# Patient Record
Sex: Male | Born: 1945
Health system: Southern US, Community
[De-identification: ages and names within clinical notes are randomized; demographics above are authoritative.]

## PROBLEM LIST (undated history)

## (undated) DIAGNOSIS — M109 Gout, unspecified: Secondary | ICD-10-CM

## (undated) DIAGNOSIS — G4733 Obstructive sleep apnea (adult) (pediatric): Secondary | ICD-10-CM

## (undated) DIAGNOSIS — J984 Other disorders of lung: Secondary | ICD-10-CM

## (undated) DIAGNOSIS — M549 Dorsalgia, unspecified: Secondary | ICD-10-CM

## (undated) DIAGNOSIS — R1310 Dysphagia, unspecified: Secondary | ICD-10-CM

## (undated) DIAGNOSIS — M353 Polymyalgia rheumatica: Secondary | ICD-10-CM

## (undated) DIAGNOSIS — I639 Cerebral infarction, unspecified: Secondary | ICD-10-CM

## (undated) DIAGNOSIS — E785 Hyperlipidemia, unspecified: Secondary | ICD-10-CM

## (undated) DIAGNOSIS — I10 Essential (primary) hypertension: Secondary | ICD-10-CM

## (undated) DIAGNOSIS — F528 Other sexual dysfunction not due to a substance or known physiological condition: Secondary | ICD-10-CM

## (undated) DIAGNOSIS — J309 Allergic rhinitis, unspecified: Secondary | ICD-10-CM

## (undated) DIAGNOSIS — R1319 Other dysphagia: Secondary | ICD-10-CM

## (undated) DIAGNOSIS — J45909 Unspecified asthma, uncomplicated: Secondary | ICD-10-CM

## (undated) DIAGNOSIS — N259 Disorder resulting from impaired renal tubular function, unspecified: Secondary | ICD-10-CM

## (undated) DIAGNOSIS — K649 Unspecified hemorrhoids: Secondary | ICD-10-CM

## (undated) DIAGNOSIS — M5030 Other cervical disc degeneration, unspecified cervical region: Secondary | ICD-10-CM

## (undated) DIAGNOSIS — N400 Enlarged prostate without lower urinary tract symptoms: Secondary | ICD-10-CM

## (undated) DIAGNOSIS — K219 Gastro-esophageal reflux disease without esophagitis: Secondary | ICD-10-CM

## (undated) DIAGNOSIS — G4730 Sleep apnea, unspecified: Secondary | ICD-10-CM

## (undated) DIAGNOSIS — F329 Major depressive disorder, single episode, unspecified: Secondary | ICD-10-CM

## (undated) DIAGNOSIS — M79609 Pain in unspecified limb: Secondary | ICD-10-CM

## (undated) DIAGNOSIS — K222 Esophageal obstruction: Secondary | ICD-10-CM

## (undated) DIAGNOSIS — K5900 Constipation, unspecified: Secondary | ICD-10-CM

## (undated) DIAGNOSIS — T7840XA Allergy, unspecified, initial encounter: Secondary | ICD-10-CM

## (undated) DIAGNOSIS — M1990 Unspecified osteoarthritis, unspecified site: Secondary | ICD-10-CM

## (undated) DIAGNOSIS — M545 Low back pain: Secondary | ICD-10-CM

## (undated) DIAGNOSIS — M2550 Pain in unspecified joint: Secondary | ICD-10-CM

## (undated) DIAGNOSIS — Z8679 Personal history of other diseases of the circulatory system: Secondary | ICD-10-CM

## (undated) DIAGNOSIS — Z86010 Personal history of colonic polyps: Secondary | ICD-10-CM

## (undated) DIAGNOSIS — G629 Polyneuropathy, unspecified: Secondary | ICD-10-CM

## (undated) DIAGNOSIS — N289 Disorder of kidney and ureter, unspecified: Secondary | ICD-10-CM

## (undated) DIAGNOSIS — K5730 Diverticulosis of large intestine without perforation or abscess without bleeding: Secondary | ICD-10-CM

## (undated) DIAGNOSIS — I7389 Other specified peripheral vascular diseases: Secondary | ICD-10-CM

## (undated) HISTORY — DX: Allergic rhinitis, unspecified: J30.9

## (undated) HISTORY — PX: TOTAL KNEE ARTHROPLASTY: SHX125

## (undated) HISTORY — DX: Constipation, unspecified: K59.00

## (undated) HISTORY — DX: Major depressive disorder, single episode, unspecified: F32.9

## (undated) HISTORY — DX: Pain in unspecified joint: M25.50

## (undated) HISTORY — DX: Unspecified asthma, uncomplicated: J45.909

## (undated) HISTORY — DX: Obstructive sleep apnea (adult) (pediatric): G47.33

## (undated) HISTORY — DX: Unspecified osteoarthritis, unspecified site: M19.90

## (undated) HISTORY — DX: Other disorders of lung: J98.4

## (undated) HISTORY — DX: Esophageal obstruction: K22.2

## (undated) HISTORY — DX: Diverticulosis of large intestine without perforation or abscess without bleeding: K57.30

## (undated) HISTORY — DX: Benign prostatic hyperplasia without lower urinary tract symptoms: N40.0

## (undated) HISTORY — DX: Gout, unspecified: M10.9

## (undated) HISTORY — DX: Other sexual dysfunction not due to a substance or known physiological condition: F52.8

## (undated) HISTORY — DX: Other cervical disc degeneration, unspecified cervical region: M50.30

## (undated) HISTORY — DX: Allergy, unspecified, initial encounter: T78.40XA

## (undated) HISTORY — PX: KNEE SURGERY: SHX244

## (undated) HISTORY — PX: KNEE ARTHROSCOPY: SUR90

## (undated) HISTORY — DX: Disorder of kidney and ureter, unspecified: N28.9

## (undated) HISTORY — DX: Other specified peripheral vascular diseases: I73.89

## (undated) HISTORY — DX: Dorsalgia, unspecified: M54.9

## (undated) HISTORY — DX: Sleep apnea, unspecified: G47.30

## (undated) HISTORY — DX: Dysphagia, unspecified: R13.10

## (undated) HISTORY — PX: ROTATOR CUFF REPAIR: SHX139

## (undated) HISTORY — PX: COLONOSCOPY: SHX174

## (undated) HISTORY — DX: Essential (primary) hypertension: I10

## (undated) HISTORY — DX: Polymyalgia rheumatica: M35.3

## (undated) HISTORY — DX: Personal history of colonic polyps: Z86.010

## (undated) HISTORY — DX: Gastro-esophageal reflux disease without esophagitis: K21.9

## (undated) HISTORY — DX: Cerebral infarction, unspecified: I63.9

## (undated) HISTORY — DX: Pain in unspecified limb: M79.609

## (undated) HISTORY — DX: Low back pain: M54.5

## (undated) HISTORY — DX: Hyperlipidemia, unspecified: E78.5

## (undated) HISTORY — DX: Polyneuropathy, unspecified: G62.9

## (undated) HISTORY — DX: Unspecified hemorrhoids: K64.9

## (undated) HISTORY — DX: Personal history of other diseases of the circulatory system: Z86.79

## (undated) HISTORY — DX: Disorder resulting from impaired renal tubular function, unspecified: N25.9

## (undated) HISTORY — DX: Other dysphagia: R13.19

---

## 1997-12-09 ENCOUNTER — Emergency Department (HOSPITAL_COMMUNITY): Admission: EM | Admit: 1997-12-09 | Discharge: 1997-12-09 | Payer: Self-pay | Admitting: Emergency Medicine

## 1998-12-07 ENCOUNTER — Emergency Department (HOSPITAL_COMMUNITY): Admission: EM | Admit: 1998-12-07 | Discharge: 1998-12-07 | Payer: Self-pay | Admitting: Emergency Medicine

## 1999-04-30 ENCOUNTER — Encounter: Payer: Self-pay | Admitting: Emergency Medicine

## 1999-04-30 ENCOUNTER — Emergency Department (HOSPITAL_COMMUNITY): Admission: EM | Admit: 1999-04-30 | Discharge: 1999-04-30 | Payer: Self-pay | Admitting: Emergency Medicine

## 1999-11-01 ENCOUNTER — Inpatient Hospital Stay (HOSPITAL_COMMUNITY): Admission: EM | Admit: 1999-11-01 | Discharge: 1999-11-02 | Payer: Self-pay | Admitting: Emergency Medicine

## 1999-11-01 ENCOUNTER — Encounter: Payer: Self-pay | Admitting: Emergency Medicine

## 1999-11-10 ENCOUNTER — Encounter: Admission: RE | Admit: 1999-11-10 | Discharge: 1999-11-10 | Payer: Self-pay | Admitting: Family Medicine

## 1999-11-13 ENCOUNTER — Encounter: Admission: RE | Admit: 1999-11-13 | Discharge: 1999-11-13 | Payer: Self-pay | Admitting: Family Medicine

## 1999-11-22 ENCOUNTER — Ambulatory Visit (HOSPITAL_COMMUNITY): Admission: RE | Admit: 1999-11-22 | Discharge: 1999-11-22 | Payer: Self-pay | Admitting: *Deleted

## 1999-12-01 ENCOUNTER — Encounter: Admission: RE | Admit: 1999-12-01 | Discharge: 1999-12-01 | Payer: Self-pay | Admitting: Family Medicine

## 2000-01-31 ENCOUNTER — Encounter: Admission: RE | Admit: 2000-01-31 | Discharge: 2000-01-31 | Payer: Self-pay | Admitting: Family Medicine

## 2000-04-17 ENCOUNTER — Emergency Department (HOSPITAL_COMMUNITY): Admission: EM | Admit: 2000-04-17 | Discharge: 2000-04-17 | Payer: Self-pay | Admitting: Emergency Medicine

## 2000-06-22 ENCOUNTER — Encounter: Payer: Self-pay | Admitting: Emergency Medicine

## 2000-06-22 ENCOUNTER — Emergency Department (HOSPITAL_COMMUNITY): Admission: EM | Admit: 2000-06-22 | Discharge: 2000-06-22 | Payer: Self-pay | Admitting: Emergency Medicine

## 2000-06-23 ENCOUNTER — Emergency Department (HOSPITAL_COMMUNITY): Admission: EM | Admit: 2000-06-23 | Discharge: 2000-06-23 | Payer: Self-pay | Admitting: Emergency Medicine

## 2001-04-11 ENCOUNTER — Encounter: Payer: Self-pay | Admitting: Internal Medicine

## 2001-04-11 ENCOUNTER — Ambulatory Visit (HOSPITAL_COMMUNITY): Admission: RE | Admit: 2001-04-11 | Discharge: 2001-04-11 | Payer: Self-pay | Admitting: Internal Medicine

## 2001-06-03 ENCOUNTER — Emergency Department (HOSPITAL_COMMUNITY): Admission: EM | Admit: 2001-06-03 | Discharge: 2001-06-03 | Payer: Self-pay | Admitting: Emergency Medicine

## 2002-03-26 ENCOUNTER — Emergency Department (HOSPITAL_COMMUNITY): Admission: EM | Admit: 2002-03-26 | Discharge: 2002-03-26 | Payer: Self-pay | Admitting: Emergency Medicine

## 2002-04-27 ENCOUNTER — Emergency Department (HOSPITAL_COMMUNITY): Admission: EM | Admit: 2002-04-27 | Discharge: 2002-04-28 | Payer: Self-pay | Admitting: Emergency Medicine

## 2002-04-27 ENCOUNTER — Encounter: Payer: Self-pay | Admitting: Emergency Medicine

## 2002-04-29 ENCOUNTER — Ambulatory Visit (HOSPITAL_COMMUNITY): Admission: RE | Admit: 2002-04-29 | Discharge: 2002-04-29 | Payer: Self-pay | Admitting: *Deleted

## 2002-04-29 ENCOUNTER — Encounter: Payer: Self-pay | Admitting: Specialist

## 2003-04-09 ENCOUNTER — Encounter: Payer: Self-pay | Admitting: *Deleted

## 2003-04-09 ENCOUNTER — Emergency Department (HOSPITAL_COMMUNITY): Admission: EM | Admit: 2003-04-09 | Discharge: 2003-04-09 | Payer: Self-pay | Admitting: *Deleted

## 2003-06-03 ENCOUNTER — Emergency Department (HOSPITAL_COMMUNITY): Admission: EM | Admit: 2003-06-03 | Discharge: 2003-06-03 | Payer: Self-pay

## 2003-11-06 ENCOUNTER — Emergency Department (HOSPITAL_COMMUNITY): Admission: EM | Admit: 2003-11-06 | Discharge: 2003-11-06 | Payer: Self-pay | Admitting: Emergency Medicine

## 2004-03-10 ENCOUNTER — Emergency Department (HOSPITAL_COMMUNITY): Admission: EM | Admit: 2004-03-10 | Discharge: 2004-03-11 | Payer: Self-pay | Admitting: Emergency Medicine

## 2004-05-11 ENCOUNTER — Emergency Department (HOSPITAL_COMMUNITY): Admission: EM | Admit: 2004-05-11 | Discharge: 2004-05-12 | Payer: Self-pay | Admitting: Emergency Medicine

## 2005-01-28 ENCOUNTER — Emergency Department (HOSPITAL_COMMUNITY): Admission: EM | Admit: 2005-01-28 | Discharge: 2005-01-28 | Payer: Self-pay | Admitting: Emergency Medicine

## 2005-02-01 ENCOUNTER — Ambulatory Visit: Payer: Self-pay | Admitting: Internal Medicine

## 2005-02-25 ENCOUNTER — Emergency Department (HOSPITAL_COMMUNITY): Admission: EM | Admit: 2005-02-25 | Discharge: 2005-02-25 | Payer: Self-pay | Admitting: Emergency Medicine

## 2005-04-05 ENCOUNTER — Emergency Department (HOSPITAL_COMMUNITY): Admission: EM | Admit: 2005-04-05 | Discharge: 2005-04-05 | Payer: Self-pay | Admitting: Family Medicine

## 2005-04-08 ENCOUNTER — Emergency Department (HOSPITAL_COMMUNITY): Admission: EM | Admit: 2005-04-08 | Discharge: 2005-04-08 | Payer: Self-pay | Admitting: Emergency Medicine

## 2005-05-01 ENCOUNTER — Ambulatory Visit: Payer: Self-pay | Admitting: Internal Medicine

## 2005-05-02 ENCOUNTER — Ambulatory Visit: Payer: Self-pay | Admitting: Internal Medicine

## 2005-05-02 LAB — CONVERTED CEMR LAB: PSA: 0.62 ng/mL

## 2005-05-20 ENCOUNTER — Emergency Department (HOSPITAL_COMMUNITY): Admission: EM | Admit: 2005-05-20 | Discharge: 2005-05-20 | Payer: Self-pay | Admitting: Emergency Medicine

## 2005-05-24 ENCOUNTER — Ambulatory Visit (HOSPITAL_COMMUNITY): Admission: RE | Admit: 2005-05-24 | Discharge: 2005-05-24 | Payer: Self-pay | Admitting: Emergency Medicine

## 2005-06-12 ENCOUNTER — Ambulatory Visit: Payer: Self-pay | Admitting: Internal Medicine

## 2005-06-12 ENCOUNTER — Observation Stay (HOSPITAL_COMMUNITY): Admission: EM | Admit: 2005-06-12 | Discharge: 2005-06-13 | Payer: Self-pay | Admitting: Emergency Medicine

## 2005-06-14 ENCOUNTER — Ambulatory Visit: Payer: Self-pay

## 2005-06-19 ENCOUNTER — Ambulatory Visit: Payer: Self-pay

## 2005-06-22 ENCOUNTER — Ambulatory Visit: Payer: Self-pay | Admitting: Internal Medicine

## 2005-07-22 ENCOUNTER — Emergency Department (HOSPITAL_COMMUNITY): Admission: EM | Admit: 2005-07-22 | Discharge: 2005-07-22 | Payer: Self-pay | Admitting: Emergency Medicine

## 2005-07-29 ENCOUNTER — Emergency Department (HOSPITAL_COMMUNITY): Admission: EM | Admit: 2005-07-29 | Discharge: 2005-07-29 | Payer: Self-pay | Admitting: Emergency Medicine

## 2005-08-07 ENCOUNTER — Emergency Department (HOSPITAL_COMMUNITY): Admission: EM | Admit: 2005-08-07 | Discharge: 2005-08-08 | Payer: Self-pay | Admitting: Emergency Medicine

## 2005-08-23 ENCOUNTER — Ambulatory Visit: Payer: Self-pay | Admitting: Internal Medicine

## 2005-10-03 ENCOUNTER — Ambulatory Visit: Payer: Self-pay | Admitting: Internal Medicine

## 2005-10-17 ENCOUNTER — Emergency Department (HOSPITAL_COMMUNITY): Admission: EM | Admit: 2005-10-17 | Discharge: 2005-10-17 | Payer: Self-pay | Admitting: Emergency Medicine

## 2005-10-30 ENCOUNTER — Ambulatory Visit: Payer: Self-pay | Admitting: Pulmonary Disease

## 2005-10-31 ENCOUNTER — Ambulatory Visit: Payer: Self-pay | Admitting: Pulmonary Disease

## 2005-11-06 ENCOUNTER — Ambulatory Visit: Payer: Self-pay | Admitting: *Deleted

## 2005-11-06 ENCOUNTER — Ambulatory Visit: Payer: Self-pay | Admitting: Internal Medicine

## 2005-11-12 ENCOUNTER — Ambulatory Visit (HOSPITAL_BASED_OUTPATIENT_CLINIC_OR_DEPARTMENT_OTHER): Admission: RE | Admit: 2005-11-12 | Discharge: 2005-11-12 | Payer: Self-pay | Admitting: Pulmonary Disease

## 2005-11-27 ENCOUNTER — Ambulatory Visit: Payer: Self-pay | Admitting: Internal Medicine

## 2005-11-27 ENCOUNTER — Ambulatory Visit: Payer: Self-pay | Admitting: Pulmonary Disease

## 2005-12-06 ENCOUNTER — Emergency Department (HOSPITAL_COMMUNITY): Admission: EM | Admit: 2005-12-06 | Discharge: 2005-12-06 | Payer: Self-pay | Admitting: Emergency Medicine

## 2005-12-10 ENCOUNTER — Emergency Department (HOSPITAL_COMMUNITY): Admission: EM | Admit: 2005-12-10 | Discharge: 2005-12-10 | Payer: Self-pay | Admitting: Emergency Medicine

## 2005-12-17 ENCOUNTER — Ambulatory Visit: Payer: Self-pay | Admitting: Internal Medicine

## 2005-12-18 ENCOUNTER — Ambulatory Visit (HOSPITAL_COMMUNITY): Admission: RE | Admit: 2005-12-18 | Discharge: 2005-12-18 | Payer: Self-pay | Admitting: Internal Medicine

## 2006-01-18 ENCOUNTER — Emergency Department (HOSPITAL_COMMUNITY): Admission: EM | Admit: 2006-01-18 | Discharge: 2006-01-19 | Payer: Self-pay | Admitting: Emergency Medicine

## 2006-01-21 ENCOUNTER — Ambulatory Visit: Payer: Self-pay | Admitting: Internal Medicine

## 2006-03-19 ENCOUNTER — Emergency Department (HOSPITAL_COMMUNITY): Admission: EM | Admit: 2006-03-19 | Discharge: 2006-03-19 | Payer: Self-pay | Admitting: Emergency Medicine

## 2006-06-11 ENCOUNTER — Emergency Department (HOSPITAL_COMMUNITY): Admission: EM | Admit: 2006-06-11 | Discharge: 2006-06-11 | Payer: Self-pay | Admitting: Emergency Medicine

## 2006-08-06 ENCOUNTER — Emergency Department (HOSPITAL_COMMUNITY): Admission: EM | Admit: 2006-08-06 | Discharge: 2006-08-06 | Payer: Self-pay | Admitting: Emergency Medicine

## 2006-09-24 ENCOUNTER — Ambulatory Visit: Payer: Self-pay | Admitting: Gastroenterology

## 2006-10-08 ENCOUNTER — Encounter (INDEPENDENT_AMBULATORY_CARE_PROVIDER_SITE_OTHER): Payer: Self-pay | Admitting: Specialist

## 2006-10-08 ENCOUNTER — Ambulatory Visit: Payer: Self-pay | Admitting: Gastroenterology

## 2007-01-02 ENCOUNTER — Emergency Department (HOSPITAL_COMMUNITY): Admission: EM | Admit: 2007-01-02 | Discharge: 2007-01-02 | Payer: Self-pay | Admitting: Emergency Medicine

## 2007-01-25 ENCOUNTER — Emergency Department (HOSPITAL_COMMUNITY): Admission: EM | Admit: 2007-01-25 | Discharge: 2007-01-25 | Payer: Self-pay | Admitting: Emergency Medicine

## 2007-03-15 ENCOUNTER — Emergency Department (HOSPITAL_COMMUNITY): Admission: EM | Admit: 2007-03-15 | Discharge: 2007-03-15 | Payer: Self-pay | Admitting: Emergency Medicine

## 2007-03-28 ENCOUNTER — Encounter: Payer: Self-pay | Admitting: Internal Medicine

## 2007-03-28 DIAGNOSIS — M5030 Other cervical disc degeneration, unspecified cervical region: Secondary | ICD-10-CM

## 2007-03-28 DIAGNOSIS — I10 Essential (primary) hypertension: Secondary | ICD-10-CM

## 2007-03-28 DIAGNOSIS — M1990 Unspecified osteoarthritis, unspecified site: Secondary | ICD-10-CM

## 2007-03-28 DIAGNOSIS — M159 Polyosteoarthritis, unspecified: Secondary | ICD-10-CM | POA: Insufficient documentation

## 2007-03-28 HISTORY — DX: Unspecified osteoarthritis, unspecified site: M19.90

## 2007-03-28 HISTORY — DX: Other cervical disc degeneration, unspecified cervical region: M50.30

## 2007-03-28 HISTORY — DX: Essential (primary) hypertension: I10

## 2007-04-09 ENCOUNTER — Encounter: Payer: Self-pay | Admitting: Internal Medicine

## 2007-04-09 DIAGNOSIS — G4733 Obstructive sleep apnea (adult) (pediatric): Secondary | ICD-10-CM

## 2007-04-09 DIAGNOSIS — N400 Enlarged prostate without lower urinary tract symptoms: Secondary | ICD-10-CM

## 2007-04-09 DIAGNOSIS — J309 Allergic rhinitis, unspecified: Secondary | ICD-10-CM

## 2007-04-09 DIAGNOSIS — F528 Other sexual dysfunction not due to a substance or known physiological condition: Secondary | ICD-10-CM

## 2007-04-09 DIAGNOSIS — M545 Low back pain, unspecified: Secondary | ICD-10-CM

## 2007-04-09 HISTORY — DX: Obstructive sleep apnea (adult) (pediatric): G47.33

## 2007-04-09 HISTORY — DX: Other sexual dysfunction not due to a substance or known physiological condition: F52.8

## 2007-04-09 HISTORY — DX: Benign prostatic hyperplasia without lower urinary tract symptoms: N40.0

## 2007-04-09 HISTORY — DX: Allergic rhinitis, unspecified: J30.9

## 2007-04-09 HISTORY — DX: Low back pain, unspecified: M54.50

## 2007-04-15 ENCOUNTER — Emergency Department (HOSPITAL_COMMUNITY): Admission: EM | Admit: 2007-04-15 | Discharge: 2007-04-15 | Payer: Self-pay | Admitting: Emergency Medicine

## 2007-08-12 ENCOUNTER — Emergency Department (HOSPITAL_COMMUNITY): Admission: EM | Admit: 2007-08-12 | Discharge: 2007-08-12 | Payer: Self-pay | Admitting: Emergency Medicine

## 2007-08-21 ENCOUNTER — Emergency Department (HOSPITAL_COMMUNITY): Admission: EM | Admit: 2007-08-21 | Discharge: 2007-08-21 | Payer: Self-pay | Admitting: Emergency Medicine

## 2007-09-17 ENCOUNTER — Emergency Department (HOSPITAL_COMMUNITY): Admission: EM | Admit: 2007-09-17 | Discharge: 2007-09-17 | Payer: Self-pay | Admitting: Emergency Medicine

## 2007-09-20 ENCOUNTER — Emergency Department (HOSPITAL_COMMUNITY): Admission: EM | Admit: 2007-09-20 | Discharge: 2007-09-20 | Payer: Self-pay | Admitting: Emergency Medicine

## 2007-10-13 ENCOUNTER — Emergency Department (HOSPITAL_COMMUNITY): Admission: EM | Admit: 2007-10-13 | Discharge: 2007-10-13 | Payer: Self-pay | Admitting: Emergency Medicine

## 2007-11-10 ENCOUNTER — Encounter: Payer: Self-pay | Admitting: Internal Medicine

## 2007-11-17 ENCOUNTER — Encounter: Payer: Self-pay | Admitting: Internal Medicine

## 2007-11-20 ENCOUNTER — Telehealth: Payer: Self-pay | Admitting: Internal Medicine

## 2007-12-04 ENCOUNTER — Ambulatory Visit: Payer: Self-pay | Admitting: Internal Medicine

## 2007-12-04 DIAGNOSIS — Z860100 Personal history of colon polyps, unspecified: Secondary | ICD-10-CM

## 2007-12-04 DIAGNOSIS — J984 Other disorders of lung: Secondary | ICD-10-CM

## 2007-12-04 DIAGNOSIS — K5730 Diverticulosis of large intestine without perforation or abscess without bleeding: Secondary | ICD-10-CM | POA: Insufficient documentation

## 2007-12-04 DIAGNOSIS — E7800 Pure hypercholesterolemia, unspecified: Secondary | ICD-10-CM | POA: Insufficient documentation

## 2007-12-04 DIAGNOSIS — E785 Hyperlipidemia, unspecified: Secondary | ICD-10-CM

## 2007-12-04 DIAGNOSIS — J45909 Unspecified asthma, uncomplicated: Secondary | ICD-10-CM

## 2007-12-04 DIAGNOSIS — K219 Gastro-esophageal reflux disease without esophagitis: Secondary | ICD-10-CM

## 2007-12-04 DIAGNOSIS — F329 Major depressive disorder, single episode, unspecified: Secondary | ICD-10-CM

## 2007-12-04 DIAGNOSIS — F3289 Other specified depressive episodes: Secondary | ICD-10-CM

## 2007-12-04 HISTORY — DX: Other specified depressive episodes: F32.89

## 2007-12-04 HISTORY — DX: Personal history of colonic polyps: Z86.010

## 2007-12-04 HISTORY — DX: Hyperlipidemia, unspecified: E78.5

## 2007-12-04 HISTORY — DX: Other disorders of lung: J98.4

## 2007-12-04 HISTORY — DX: Personal history of colon polyps, unspecified: Z86.0100

## 2007-12-04 HISTORY — DX: Major depressive disorder, single episode, unspecified: F32.9

## 2007-12-04 HISTORY — DX: Gastro-esophageal reflux disease without esophagitis: K21.9

## 2007-12-04 HISTORY — DX: Diverticulosis of large intestine without perforation or abscess without bleeding: K57.30

## 2007-12-04 HISTORY — DX: Unspecified asthma, uncomplicated: J45.909

## 2007-12-10 ENCOUNTER — Encounter: Admission: RE | Admit: 2007-12-10 | Discharge: 2008-03-09 | Payer: Self-pay | Admitting: Sports Medicine

## 2007-12-12 ENCOUNTER — Ambulatory Visit (HOSPITAL_COMMUNITY): Admission: RE | Admit: 2007-12-12 | Discharge: 2007-12-12 | Payer: Self-pay | Admitting: Internal Medicine

## 2007-12-17 ENCOUNTER — Encounter: Payer: Self-pay | Admitting: Internal Medicine

## 2008-01-19 ENCOUNTER — Encounter: Payer: Self-pay | Admitting: Internal Medicine

## 2008-02-11 ENCOUNTER — Ambulatory Visit: Payer: Self-pay | Admitting: Internal Medicine

## 2008-02-11 ENCOUNTER — Telehealth (INDEPENDENT_AMBULATORY_CARE_PROVIDER_SITE_OTHER): Payer: Self-pay | Admitting: *Deleted

## 2008-02-11 DIAGNOSIS — K649 Unspecified hemorrhoids: Secondary | ICD-10-CM | POA: Insufficient documentation

## 2008-02-11 HISTORY — DX: Unspecified hemorrhoids: K64.9

## 2008-02-12 LAB — CONVERTED CEMR LAB
ALT: 21 units/L (ref 0–53)
Basophils Relative: 0.5 % (ref 0.0–1.0)
CO2: 28 meq/L (ref 19–32)
Calcium: 9.1 mg/dL (ref 8.4–10.5)
Creatinine, Ser: 1.6 mg/dL — ABNORMAL HIGH (ref 0.4–1.5)
Eosinophils Relative: 5 % (ref 0.0–5.0)
Glucose, Bld: 97 mg/dL (ref 70–99)
Hemoglobin: 12.8 g/dL — ABNORMAL LOW (ref 13.0–17.0)
Ketones, ur: NEGATIVE mg/dL
Leukocytes, UA: NEGATIVE
Lymphocytes Relative: 30.7 % (ref 12.0–46.0)
Monocytes Relative: 17.4 % — ABNORMAL HIGH (ref 3.0–12.0)
Neutro Abs: 2.2 10*3/uL (ref 1.4–7.7)
Nitrite: NEGATIVE
PSA: 0.75 ng/mL (ref 0.10–4.00)
RBC: 4.53 M/uL (ref 4.22–5.81)
Specific Gravity, Urine: 1.015 (ref 1.000–1.03)
TSH: 1.21 microintl units/mL (ref 0.35–5.50)
Total CHOL/HDL Ratio: 5.1
Total Protein: 7.5 g/dL (ref 6.0–8.3)
Triglycerides: 95 mg/dL (ref 0–149)
pH: 6 (ref 5.0–8.0)

## 2008-02-16 ENCOUNTER — Telehealth (INDEPENDENT_AMBULATORY_CARE_PROVIDER_SITE_OTHER): Payer: Self-pay | Admitting: *Deleted

## 2008-02-18 ENCOUNTER — Ambulatory Visit: Payer: Self-pay | Admitting: Internal Medicine

## 2008-02-18 LAB — CONVERTED CEMR LAB
ALT: 20 units/L (ref 0–53)
AST: 21 units/L (ref 0–37)
Albumin: 3.4 g/dL — ABNORMAL LOW (ref 3.5–5.2)
Alkaline Phosphatase: 96 units/L (ref 39–117)
Bilirubin, Direct: 0.1 mg/dL (ref 0.0–0.3)
Cholesterol: 188 mg/dL (ref 0–200)
HDL: 47.3 mg/dL (ref >39.0)
LDL Cholesterol: 132 mg/dL — ABNORMAL HIGH (ref 0–99)
Total Bilirubin: 0.5 mg/dL (ref 0.3–1.2)
Total CHOL/HDL Ratio: 4
Total Protein: 7.2 g/dL (ref 6.0–8.3)
Triglycerides: 43 mg/dL (ref 0–149)
VLDL: 9 mg/dL (ref 0–40)

## 2008-02-19 ENCOUNTER — Telehealth: Payer: Self-pay | Admitting: Internal Medicine

## 2008-02-25 ENCOUNTER — Telehealth: Payer: Self-pay | Admitting: Gastroenterology

## 2008-03-13 ENCOUNTER — Emergency Department (HOSPITAL_COMMUNITY): Admission: EM | Admit: 2008-03-13 | Discharge: 2008-03-13 | Payer: Self-pay | Admitting: Emergency Medicine

## 2008-03-16 ENCOUNTER — Ambulatory Visit: Payer: Self-pay | Admitting: Internal Medicine

## 2008-03-16 DIAGNOSIS — R1310 Dysphagia, unspecified: Secondary | ICD-10-CM | POA: Insufficient documentation

## 2008-03-16 HISTORY — DX: Dysphagia, unspecified: R13.10

## 2008-03-17 ENCOUNTER — Encounter: Payer: Self-pay | Admitting: Internal Medicine

## 2008-03-23 ENCOUNTER — Ambulatory Visit: Payer: Self-pay | Admitting: Gastroenterology

## 2008-03-23 LAB — CONVERTED CEMR LAB
Albumin: 3.3 g/dL — ABNORMAL LOW (ref 3.5–5.2)
Alkaline Phosphatase: 93 units/L (ref 39–117)
LDL Cholesterol: 111 mg/dL — ABNORMAL HIGH (ref 0–99)
Total CHOL/HDL Ratio: 3.1
Triglycerides: 52 mg/dL (ref 0–149)
VLDL: 10 mg/dL (ref 0–40)

## 2008-03-25 ENCOUNTER — Ambulatory Visit: Payer: Self-pay | Admitting: Internal Medicine

## 2008-04-07 ENCOUNTER — Ambulatory Visit: Payer: Self-pay | Admitting: Gastroenterology

## 2008-04-12 ENCOUNTER — Telehealth: Payer: Self-pay | Admitting: Internal Medicine

## 2008-04-12 ENCOUNTER — Ambulatory Visit: Payer: Self-pay | Admitting: Internal Medicine

## 2008-04-12 DIAGNOSIS — R079 Chest pain, unspecified: Secondary | ICD-10-CM

## 2008-04-21 ENCOUNTER — Ambulatory Visit: Payer: Self-pay | Admitting: Gastroenterology

## 2008-05-09 ENCOUNTER — Emergency Department (HOSPITAL_COMMUNITY): Admission: EM | Admit: 2008-05-09 | Discharge: 2008-05-09 | Payer: Self-pay | Admitting: Emergency Medicine

## 2008-05-24 ENCOUNTER — Encounter: Payer: Self-pay | Admitting: Gastroenterology

## 2008-05-26 ENCOUNTER — Telehealth: Payer: Self-pay | Admitting: Gastroenterology

## 2008-05-26 ENCOUNTER — Ambulatory Visit: Payer: Self-pay | Admitting: Gastroenterology

## 2008-05-26 ENCOUNTER — Ambulatory Visit: Payer: Self-pay | Admitting: Internal Medicine

## 2008-05-26 DIAGNOSIS — K222 Esophageal obstruction: Secondary | ICD-10-CM

## 2008-05-26 HISTORY — DX: Esophageal obstruction: K22.2

## 2008-05-28 ENCOUNTER — Ambulatory Visit (HOSPITAL_COMMUNITY): Admission: RE | Admit: 2008-05-28 | Discharge: 2008-05-28 | Payer: Self-pay | Admitting: Gastroenterology

## 2008-06-01 ENCOUNTER — Encounter: Payer: Self-pay | Admitting: Gastroenterology

## 2008-07-02 ENCOUNTER — Encounter: Payer: Self-pay | Admitting: Internal Medicine

## 2008-07-12 ENCOUNTER — Telehealth: Payer: Self-pay | Admitting: Gastroenterology

## 2008-07-13 ENCOUNTER — Ambulatory Visit: Payer: Self-pay | Admitting: Internal Medicine

## 2008-07-13 DIAGNOSIS — M2550 Pain in unspecified joint: Secondary | ICD-10-CM

## 2008-07-13 HISTORY — DX: Pain in unspecified joint: M25.50

## 2008-07-13 LAB — CONVERTED CEMR LAB: Rhuematoid fact SerPl-aCnc: <20 intl units/mL — ABNORMAL LOW (ref 0.0–20.0)

## 2008-07-14 ENCOUNTER — Telehealth: Payer: Self-pay | Admitting: Gastroenterology

## 2008-07-14 ENCOUNTER — Encounter: Payer: Self-pay | Admitting: Gastroenterology

## 2008-07-14 LAB — CONVERTED CEMR LAB: Anti Nuclear Antibody(ANA): NEGATIVE

## 2008-07-26 ENCOUNTER — Ambulatory Visit: Payer: Self-pay | Admitting: Internal Medicine

## 2008-07-26 DIAGNOSIS — M353 Polymyalgia rheumatica: Secondary | ICD-10-CM

## 2008-07-26 HISTORY — DX: Polymyalgia rheumatica: M35.3

## 2008-07-27 ENCOUNTER — Telehealth (INDEPENDENT_AMBULATORY_CARE_PROVIDER_SITE_OTHER): Payer: Self-pay | Admitting: *Deleted

## 2008-07-30 ENCOUNTER — Encounter: Payer: Self-pay | Admitting: Internal Medicine

## 2008-10-01 ENCOUNTER — Encounter: Payer: Self-pay | Admitting: Internal Medicine

## 2009-01-12 ENCOUNTER — Encounter: Admission: RE | Admit: 2009-01-12 | Discharge: 2009-01-12 | Payer: Self-pay | Admitting: Nephrology

## 2009-01-17 ENCOUNTER — Ambulatory Visit (HOSPITAL_COMMUNITY): Admission: RE | Admit: 2009-01-17 | Discharge: 2009-01-17 | Payer: Self-pay | Admitting: Nephrology

## 2009-01-21 ENCOUNTER — Ambulatory Visit (HOSPITAL_COMMUNITY): Admission: RE | Admit: 2009-01-21 | Discharge: 2009-01-21 | Payer: Self-pay | Admitting: Nephrology

## 2009-04-01 ENCOUNTER — Emergency Department (HOSPITAL_COMMUNITY): Admission: EM | Admit: 2009-04-01 | Discharge: 2009-04-02 | Payer: Self-pay | Admitting: Emergency Medicine

## 2009-06-14 ENCOUNTER — Encounter: Admission: RE | Admit: 2009-06-14 | Discharge: 2009-06-14 | Payer: Self-pay | Admitting: Nephrology

## 2009-08-31 ENCOUNTER — Encounter: Payer: Self-pay | Admitting: Internal Medicine

## 2009-09-16 ENCOUNTER — Ambulatory Visit: Payer: Self-pay | Admitting: Internal Medicine

## 2009-09-16 DIAGNOSIS — M549 Dorsalgia, unspecified: Secondary | ICD-10-CM | POA: Insufficient documentation

## 2009-09-16 HISTORY — DX: Dorsalgia, unspecified: M54.9

## 2009-09-16 LAB — CONVERTED CEMR LAB
Albumin: 3.7 g/dL (ref 3.5–5.2)
BUN: 15 mg/dL (ref 6–23)
Bilirubin Urine: NEGATIVE
Chloride: 104 meq/L (ref 96–112)
Cholesterol: 202 mg/dL — ABNORMAL HIGH (ref 0–200)
Creatinine, Ser: 1.9 mg/dL — ABNORMAL HIGH (ref 0.4–1.5)
Glucose, Bld: 92 mg/dL (ref 70–99)
HCT: 46.1 % (ref 39.0–52.0)
HDL: 47.2 mg/dL (ref >39.00)
Ketones, ur: NEGATIVE mg/dL
MCV: 85.2 fL (ref 78.0–100.0)
Nitrite: NEGATIVE
Platelets: 197 10*3/uL (ref 150.0–400.0)
Potassium: 4 meq/L (ref 3.5–5.1)
RDW: 12.4 % (ref 11.5–14.6)
Sodium: 141 meq/L (ref 135–145)
Total Protein, Urine: NEGATIVE mg/dL
Triglycerides: 55 mg/dL (ref 0.0–149.0)
Urine Glucose: NEGATIVE mg/dL
VLDL: 11 mg/dL (ref 0.0–40.0)
pH: 6 (ref 5.0–8.0)

## 2009-09-19 ENCOUNTER — Telehealth: Payer: Self-pay | Admitting: Internal Medicine

## 2009-10-04 ENCOUNTER — Telehealth: Payer: Self-pay | Admitting: Internal Medicine

## 2009-11-21 ENCOUNTER — Ambulatory Visit: Payer: Self-pay | Admitting: Internal Medicine

## 2009-11-21 DIAGNOSIS — R1319 Other dysphagia: Secondary | ICD-10-CM

## 2009-11-21 DIAGNOSIS — R1013 Epigastric pain: Secondary | ICD-10-CM | POA: Insufficient documentation

## 2009-11-21 HISTORY — DX: Other dysphagia: R13.19

## 2009-11-21 LAB — CONVERTED CEMR LAB
ALT: 20 units/L (ref 0–53)
Albumin: 3.5 g/dL (ref 3.5–5.2)
BUN: 16 mg/dL (ref 6–23)
Basophils Relative: 0.6 % (ref 0.0–3.0)
Bilirubin Urine: NEGATIVE
Chloride: 105 meq/L (ref 96–112)
Eosinophils Relative: 5.6 % — ABNORMAL HIGH (ref 0.0–5.0)
HCT: 38.9 % — ABNORMAL LOW (ref 39.0–52.0)
Hemoglobin, Urine: NEGATIVE
Leukocytes, UA: NEGATIVE
Lipase: 24 units/L (ref 11.0–59.0)
Lymphs Abs: 1.7 10*3/uL (ref 0.7–4.0)
MCV: 82.9 fL (ref 78.0–100.0)
Monocytes Absolute: 0.8 10*3/uL (ref 0.1–1.0)
Nitrite: NEGATIVE
Platelets: 227 10*3/uL (ref 150.0–400.0)
Potassium: 4.3 meq/L (ref 3.5–5.1)
Total Bilirubin: 0.4 mg/dL (ref 0.3–1.2)
Total Protein: 7 g/dL (ref 6.0–8.3)
Urobilinogen, UA: 0.2 (ref 0.0–1.0)
WBC: 5.1 10*3/uL (ref 4.5–10.5)

## 2009-11-23 ENCOUNTER — Telehealth: Payer: Self-pay | Admitting: Gastroenterology

## 2009-12-09 ENCOUNTER — Encounter (INDEPENDENT_AMBULATORY_CARE_PROVIDER_SITE_OTHER): Payer: Self-pay | Admitting: *Deleted

## 2009-12-21 ENCOUNTER — Ambulatory Visit: Payer: Self-pay | Admitting: Internal Medicine

## 2009-12-21 ENCOUNTER — Ambulatory Visit: Payer: Self-pay | Admitting: Gastroenterology

## 2009-12-26 ENCOUNTER — Telehealth: Payer: Self-pay | Admitting: Internal Medicine

## 2009-12-26 ENCOUNTER — Telehealth (INDEPENDENT_AMBULATORY_CARE_PROVIDER_SITE_OTHER): Payer: Self-pay | Admitting: *Deleted

## 2009-12-30 ENCOUNTER — Ambulatory Visit: Payer: Self-pay | Admitting: Gastroenterology

## 2009-12-30 ENCOUNTER — Encounter: Payer: Self-pay | Admitting: Gastroenterology

## 2010-01-20 ENCOUNTER — Ambulatory Visit: Payer: Self-pay | Admitting: Internal Medicine

## 2010-01-25 ENCOUNTER — Ambulatory Visit: Payer: Self-pay | Admitting: Internal Medicine

## 2010-01-25 DIAGNOSIS — M79609 Pain in unspecified limb: Secondary | ICD-10-CM

## 2010-01-25 HISTORY — DX: Pain in unspecified limb: M79.609

## 2010-01-25 LAB — CONVERTED CEMR LAB
Bilirubin Urine: NEGATIVE
Hemoglobin, Urine: NEGATIVE
Ketones, ur: NEGATIVE mg/dL
Leukocytes, UA: NEGATIVE
Nitrite: NEGATIVE
Specific Gravity, Urine: 1.02 (ref 1.000–1.030)
Total Protein, Urine: NEGATIVE mg/dL
Urine Glucose: NEGATIVE mg/dL
Urobilinogen, UA: 0.2 (ref 0.0–1.0)
pH: 5.5 (ref 5.0–8.0)

## 2010-02-02 ENCOUNTER — Encounter: Payer: Self-pay | Admitting: Internal Medicine

## 2010-02-09 ENCOUNTER — Encounter: Payer: Self-pay | Admitting: Internal Medicine

## 2010-02-09 ENCOUNTER — Ambulatory Visit: Payer: Self-pay

## 2010-02-10 DIAGNOSIS — I739 Peripheral vascular disease, unspecified: Secondary | ICD-10-CM | POA: Insufficient documentation

## 2010-02-10 DIAGNOSIS — I7389 Other specified peripheral vascular diseases: Secondary | ICD-10-CM

## 2010-02-10 HISTORY — DX: Other specified peripheral vascular diseases: I73.89

## 2010-02-23 ENCOUNTER — Emergency Department (HOSPITAL_COMMUNITY): Admission: EM | Admit: 2010-02-23 | Discharge: 2010-02-23 | Payer: Self-pay | Admitting: Emergency Medicine

## 2010-02-27 ENCOUNTER — Telehealth: Payer: Self-pay | Admitting: Internal Medicine

## 2010-03-10 ENCOUNTER — Ambulatory Visit: Payer: Self-pay | Admitting: Internal Medicine

## 2010-03-10 DIAGNOSIS — J069 Acute upper respiratory infection, unspecified: Secondary | ICD-10-CM | POA: Insufficient documentation

## 2010-03-10 DIAGNOSIS — N390 Urinary tract infection, site not specified: Secondary | ICD-10-CM | POA: Insufficient documentation

## 2010-03-11 LAB — CONVERTED CEMR LAB
Ketones, ur: NEGATIVE mg/dL
Leukocytes, UA: NEGATIVE
Specific Gravity, Urine: 1.02 (ref 1.000–1.030)
Urine Glucose: NEGATIVE mg/dL
pH: 5.5 (ref 5.0–8.0)

## 2010-03-13 ENCOUNTER — Encounter: Payer: Self-pay | Admitting: Internal Medicine

## 2010-03-15 ENCOUNTER — Telehealth: Payer: Self-pay | Admitting: Internal Medicine

## 2010-03-15 ENCOUNTER — Ambulatory Visit: Payer: Self-pay | Admitting: Internal Medicine

## 2010-03-15 DIAGNOSIS — R109 Unspecified abdominal pain: Secondary | ICD-10-CM | POA: Insufficient documentation

## 2010-03-15 DIAGNOSIS — N259 Disorder resulting from impaired renal tubular function, unspecified: Secondary | ICD-10-CM

## 2010-03-15 HISTORY — DX: Disorder resulting from impaired renal tubular function, unspecified: N25.9

## 2010-03-16 ENCOUNTER — Ambulatory Visit: Payer: Self-pay | Admitting: Cardiovascular Disease

## 2010-03-16 LAB — CONVERTED CEMR LAB
ALT: 16 units/L (ref 0–53)
AST: 19 units/L (ref 0–37)
Alkaline Phosphatase: 86 units/L (ref 39–117)
Basophils Relative: 0.3 % (ref 0.0–3.0)
Bilirubin, Direct: 0.1 mg/dL (ref 0.0–0.3)
Chloride: 102 meq/L (ref 96–112)
Creatinine, Ser: 2 mg/dL — ABNORMAL HIGH (ref 0.4–1.5)
Eosinophils Relative: 1.9 % (ref 0.0–5.0)
GFR calc non Af Amer: 35.35 mL/min (ref >60)
Lymphocytes Relative: 33.5 % (ref 12.0–46.0)
MCV: 85 fL (ref 78.0–100.0)
Monocytes Absolute: 1 10*3/uL (ref 0.1–1.0)
Monocytes Relative: 16.9 % — ABNORMAL HIGH (ref 3.0–12.0)
Neutrophils Relative %: 47.4 % (ref 43.0–77.0)
Platelets: 229 10*3/uL (ref 150.0–400.0)
RBC: 4.92 M/uL (ref 4.22–5.81)
Total Bilirubin: 0.4 mg/dL (ref 0.3–1.2)
Total Protein: 7 g/dL (ref 6.0–8.3)
WBC: 5.7 10*3/uL (ref 4.5–10.5)

## 2010-03-20 ENCOUNTER — Ambulatory Visit: Payer: Self-pay | Admitting: Internal Medicine

## 2010-03-20 LAB — CONVERTED CEMR LAB
BUN: 21 mg/dL (ref 6–23)
CO2: 28 meq/L (ref 19–32)
Calcium: 8.9 mg/dL (ref 8.4–10.5)
Chloride: 107 meq/L (ref 96–112)
Creatinine, Ser: 1.8 mg/dL — ABNORMAL HIGH (ref 0.4–1.5)
Glucose, Bld: 145 mg/dL — ABNORMAL HIGH (ref 70–99)

## 2010-03-30 ENCOUNTER — Ambulatory Visit: Payer: Self-pay | Admitting: Internal Medicine

## 2010-03-30 ENCOUNTER — Telehealth: Payer: Self-pay | Admitting: Internal Medicine

## 2010-03-30 DIAGNOSIS — M542 Cervicalgia: Secondary | ICD-10-CM

## 2010-04-11 ENCOUNTER — Encounter: Payer: Self-pay | Admitting: Internal Medicine

## 2010-04-25 ENCOUNTER — Telehealth: Payer: Self-pay | Admitting: Gastroenterology

## 2010-06-13 ENCOUNTER — Encounter: Payer: Self-pay | Admitting: Internal Medicine

## 2010-06-28 ENCOUNTER — Ambulatory Visit: Payer: Self-pay | Admitting: Internal Medicine

## 2010-06-28 DIAGNOSIS — R519 Headache, unspecified: Secondary | ICD-10-CM | POA: Insufficient documentation

## 2010-06-28 DIAGNOSIS — R413 Other amnesia: Secondary | ICD-10-CM | POA: Insufficient documentation

## 2010-06-28 DIAGNOSIS — R51 Headache: Secondary | ICD-10-CM

## 2010-07-04 ENCOUNTER — Telehealth: Payer: Self-pay | Admitting: Internal Medicine

## 2010-07-05 ENCOUNTER — Ambulatory Visit (HOSPITAL_COMMUNITY)
Admission: RE | Admit: 2010-07-05 | Discharge: 2010-07-05 | Payer: Self-pay | Source: Home / Self Care | Admitting: Internal Medicine

## 2010-07-12 ENCOUNTER — Telehealth: Payer: Self-pay | Admitting: Internal Medicine

## 2010-07-14 ENCOUNTER — Ambulatory Visit (HOSPITAL_COMMUNITY): Admission: RE | Admit: 2010-07-14 | Discharge: 2010-07-14 | Payer: Self-pay | Admitting: Internal Medicine

## 2010-07-17 ENCOUNTER — Encounter: Admission: RE | Admit: 2010-07-17 | Discharge: 2010-07-17 | Payer: Self-pay | Admitting: Internal Medicine

## 2010-07-17 ENCOUNTER — Encounter: Payer: Self-pay | Admitting: Internal Medicine

## 2010-07-17 DIAGNOSIS — Z8679 Personal history of other diseases of the circulatory system: Secondary | ICD-10-CM

## 2010-07-17 HISTORY — DX: Personal history of other diseases of the circulatory system: Z86.79

## 2010-08-23 ENCOUNTER — Ambulatory Visit
Admission: RE | Admit: 2010-08-23 | Discharge: 2010-08-23 | Payer: Self-pay | Source: Home / Self Care | Attending: Internal Medicine | Admitting: Internal Medicine

## 2010-08-23 DIAGNOSIS — J019 Acute sinusitis, unspecified: Secondary | ICD-10-CM | POA: Insufficient documentation

## 2010-08-28 ENCOUNTER — Emergency Department (HOSPITAL_COMMUNITY)
Admission: EM | Admit: 2010-08-28 | Discharge: 2010-08-28 | Payer: Self-pay | Source: Home / Self Care | Admitting: Emergency Medicine

## 2010-09-02 ENCOUNTER — Encounter: Payer: Self-pay | Admitting: Emergency Medicine

## 2010-09-03 ENCOUNTER — Encounter: Payer: Self-pay | Admitting: Internal Medicine

## 2010-09-08 ENCOUNTER — Telehealth: Payer: Self-pay | Admitting: Internal Medicine

## 2010-09-12 NOTE — Progress Notes (Signed)
Summary: Groin pain  Phone Note Call from Patient Call back at Home Phone [phone]   Caller: Patient Summary of Call: Pt called stating that he is experiencing increase groin pain since OV. Pt is unsure if he should have beeen referred to Urology, but would like referral and Rx for pain. Please advise Initial call taken by: Crissie Sickles, [place],  March 15, 2010 9:36 AM  Follow-up for Phone Call        ok for referral Follow-up by: Biagio Borg MD,  March 15, 2010 2:37 PM     Appended Document: Groin pain Pt scheduled for appt 03/15/2010 @ 2:45p

## 2010-09-12 NOTE — Assessment & Plan Note (Signed)
Summary: MEMORY PROBLEM---STC   Vital Signs:  Patient profile:   65 year old male Height:      69 inches Weight:      284.50 pounds BMI:     42.17 O2 Sat:      94 % on Room air Temp:     97.5 degrees F oral Pulse rate:   76 / minute BP sitting:   132 / 72  (left arm) Cuff size:   large  Vitals Entered By: Shirlean Mylar Ewing CMA Deborra Medina) (June 28, 2010 2:45 PM)  O2 Flow:  Room air  CC: memory problems/RE   Primary Care Provider:  Cathlean Cower, MD  CC:  memory problems/RE.  History of Present Illness: here wiht concerns, last seen aug 2011,  then saw urology nov 1 with flomax change to rapaflo and givne course of doxycyline;  pt has concerns of forgetfullness worse over the past 4 days, had an episode prior, then recurred again;  having more difficulty with directions - had to stop and think about directions coming here to here; could not remember a certain prayer he has memorized; has trouble with names of pesons he has known for quite some time like a famous person mentioned on TV but remembered later;   has been misplacing things more than usual lately in the past few months;  no trouble with medications .  Also with recurring headaches to post head and upper back and shoulders , started x 2 wks more constant, only intermittent before for a couple of months.  Pt denies new neuro symptoms such as facial or extremity weakness .  Has had increased depressive symtpoms, fatigue, wt gain, low mood, anhedonia, worse now for several wks. Denies worsening panic.    Problems Prior to Update: 1)  Memory Loss  (ICD-780.93) 2)  Headache  (ICD-784.0) 3)  Neck Pain  (ICD-723.1) 4)  Renal Insufficiency  (ICD-588.9) 5)  Abdominal Pain, Lower  (ICD-789.09) 6)  Uti  (ICD-599.0) 7)  Uri  (ICD-465.9) 8)  Pvd With Claudication  (ICD-443.89) 9)  Leg Pain, Bilateral  (ICD-729.5) 10)  Chest Pain  (ICD-786.50) 11)  Abdominal Pain, Epigastric  (ICD-789.06) 12)  Other Dysphagia  (ICD-787.29) 13)  Back Pain   (ICD-724.5) 14)  Preventive Health Care  (ICD-V70.0) 15)  Polymyalgia Rheumatica  (ICD-725) 16)  Chest Pain  (ICD-786.50) 17)  Polyarthralgia  (ICD-719.49) 18)  Esophageal Stricture  (ICD-530.3) 19)  Chest Pain  (ICD-786.50) 20)  Hepatotoxicity, Drug-induced, Risk of  (ICD-V58.69) 21)  Dysphagia Unspecified  (ICD-787.20) 22)  Hemorrhoids, Recurrent  (ICD-455.6) 23)  Preventive Health Care  (ICD-V70.0) 24)  Diverticulosis, Colon  (ICD-562.10) 25)  Colonic Polyps, Hx of  (ICD-V12.72) 26)  Asthma  (ICD-493.90) 27)  Depression  (ICD-311) 28)  Gerd  (ICD-530.81) 29)  Hyperlipidemia  (ICD-272.4) 30)  Lung Nodule  (ICD-518.89) 31)  Sleep Apnea, Obstructive, Moderate  (ICD-327.23) 32)  Benign Prostatic Hypertrophy  (ICD-600.00) 33)  Erectile Dysfunction  (ICD-302.72) 34)  Low Back Pain  (ICD-724.2) 35)  Osteoarthritis  (ICD-715.90) 36)  Allergic Rhinitis  (ICD-477.9) 37)  Degeneration, Cervical Disc  (ICD-722.4) 38)  Disc Disease, Cervical  (ICD-722.4) 39)  Degenerative Joint Disease  (ICD-715.90) 40)  Tb Skin Test, Positive  (ICD-795.5) 41)  Obesity  (ICD-278.00) 42)  Hypertension  (ICD-401.9)  Medications Prior to Update: 1)  Tamsulosin Hcl 0.4 Mg Caps (Tamsulosin Hcl) .Marland Kitchen.. 1po Once Daily 2)  Amlodipine Besylate 5 Mg Tabs (Amlodipine Besylate) .Marland Kitchen.. 1po Once Daily 3)  Flexeril 5  Mg Tabs (Cyclobenzaprine Hcl) .Marland Kitchen.. 1 By Mouth Three Times A Day As Needed 4)  Nasacort Aq 55 Mcg/act  Aers (Triamcinolone Acetonide(Nasal)) .... 2 Spray/side Once Daily As Needed Allergies 5)  Omeprazole 20 Mg Cpdr (Omeprazole) .... 2po Once Daily 6)  Aspir-Low 81 Mg Tbec (Aspirin) .Marland Kitchen.. 1po Once Daily 7)  Hydrochlorothiazide 12.5 Mg Caps (Hydrochlorothiazide) .Marland Kitchen.. 1po Once Daily 8)  Fexofenadine Hcl 180 Mg Tabs (Fexofenadine Hcl) .Marland Kitchen.. 1 By Mouth Once Daily As Needed Allergies 9)  Oxycodone Hcl 5 Mg Tabs (Oxycodone Hcl) .Marland Kitchen.. 1-2 By Mouth Q 6 Hrs As Needed 10)  Prednisone 10 Mg Tabs (Prednisone) .... 3po Qd  For 3days, Then 2po Qd For 3days, Then 1po Qd For 3days, Then Stop 11)  Zyrtec Allergy 10 Mg Tabs (Cetirizine Hcl) .Marland Kitchen.. 1 By Mouth Qd 12)  Hydrocortisone Acetate 25 Mg Supp (Hydrocortisone Acetate) .... Put 1 in Rectum Every Night At Bedtime For 7-10 Nights, For Hemorrhoids. 13)  Miralax   Powd (Polyethylene Glycol 3350) .... Mix 17gm in A Large Glass of Juice or Water and Drink 1 Time Daily.  Current Medications (verified): 1)  Rapaflo 8 Mg Caps (Silodosin) .Marland Kitchen.. 1po Once Daily 2)  Amlodipine Besylate 5 Mg Tabs (Amlodipine Besylate) .Marland Kitchen.. 1po Once Daily 3)  Flexeril 5 Mg Tabs (Cyclobenzaprine Hcl) .Marland Kitchen.. 1 By Mouth Three Times A Day As Needed 4)  Nasacort Aq 55 Mcg/act  Aers (Triamcinolone Acetonide(Nasal)) .... 2 Spray/side Once Daily As Needed Allergies 5)  Omeprazole 20 Mg Cpdr (Omeprazole) .... 2po Once Daily 6)  Aspir-Low 81 Mg Tbec (Aspirin) .Marland Kitchen.. 1po Once Daily 7)  Hydrochlorothiazide 12.5 Mg Caps (Hydrochlorothiazide) .Marland Kitchen.. 1po Once Daily 8)  Fexofenadine Hcl 180 Mg Tabs (Fexofenadine Hcl) .Marland Kitchen.. 1 By Mouth Once Daily As Needed Allergies 9)  Oxycodone Hcl 5 Mg Tabs (Oxycodone Hcl) .Marland Kitchen.. 1-2 By Mouth Q 6 Hrs As Needed 10)  Zyrtec Allergy 10 Mg Tabs (Cetirizine Hcl) .Marland Kitchen.. 1 By Mouth Qd 11)  Sertraline Hcl 100 Mg Tabs (Sertraline Hcl) .Marland Kitchen.. 1 By Mouth Once Daily  Allergies (verified): No Known Drug Allergies  Past History:  Past Medical History: Last updated: 03/15/2010 Hypertension Allergic rhinitis Obesity Osteoarthritis Low back pain Hx of Positive PPD Erectile Dysfunction Benign prostatic hypertrophy Obstructive Sleep Apnea Hyperlipidemia c-spine disc disease GERD Depression Asthma Colonic polyps, hx of - adenomatous Diverticulosis, colon variable medical compliance PMR - probable DDD Renal insufficiency  Past Surgical History: Last updated: 12/16/2009 Total knee replacement Rotator cuff repair - left  Social History: Last updated: 03/23/2008 Married seperated 6  children - 1 died with suicide, 1 with 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   Risk Factors: Exercise: no (03/23/2008)  Risk Factors: Smoking Status: quit (12/04/2007)  Review of Systems       all otherwise negative per pt -    Physical Exam  General:  alert and overweight-appearing.   Head:  normocephalic and atraumatic.   Eyes:  vision grossly intact, pupils equal, and pupils round.   Ears:  R ear normal and L ear normal.   Nose:  no external deformity, nasal dischargemucosal pallor, and mucosal edema.   Mouth:  pharyngeal erythema and fair dentition.   Neck:  supple and no masses.   Lungs:  normal respiratory effort and normal breath sounds.   Heart:  normal rate and regular rhythm.   Abdomen:  soft, non-tender, and normal bowel  sounds.   Extremities:  no edema, no erythema  Neurologic:  strength normal in all extremities, sensation intact to light touch, and gait normal.  cognitive intact to orientation, recall, naming, and repetition  Psych:  flat affect, subdued, and slightly anxious.     Impression & Recommendations:  Problem # 1:  HEADACHE (ICD-784.0)  His updated medication list for this problem includes:    Aspir-low 81 Mg Tbec (Aspirin) .Marland Kitchen... 1po once daily    Oxycodone Hcl 5 Mg Tabs (Oxycodone hcl) .Marland Kitchen... 1-2 by mouth q 6 hrs as needed  Orders: Radiology Referral (Radiology) cant r/o tumor or other - for MRI head  Problem # 2:  MEMORY LOSS (ICD-780.93) doubt alzheimer's, seems to be possibly more psychiatric with worsening depressin and concentration issue - for MRI as above, consider further labs and/or neuro eval  Problem # 3:  DEPRESSION (ICD-311)  recurrent , now more symptomatic, and I suspect possibly related to above #2 - for sertraline asd , declines counseling, nonsuicidal adn encouraged to go to [facility] or ER for worsening  symptoms  His updated medication list for this problem includes:    Sertraline Hcl 100 Mg Tabs (Sertraline hcl) .Marland Kitchen... 1 by mouth once daily  Complete Medication List: 1)  Rapaflo 8 Mg Caps (Silodosin) .Marland Kitchen.. 1po once daily 2)  Amlodipine Besylate 5 Mg Tabs (Amlodipine besylate) .Marland Kitchen.. 1po once daily 3)  Flexeril 5 Mg Tabs (Cyclobenzaprine hcl) .Marland Kitchen.. 1 by mouth three times a day as needed 4)  Nasacort Aq 55 Mcg/act Aers (Triamcinolone acetonide(nasal)) .... 2 spray/side once daily as needed allergies 5)  Omeprazole 20 Mg Cpdr (Omeprazole) .... 2po once daily 6)  Aspir-low 81 Mg Tbec (Aspirin) .Marland Kitchen.. 1po once daily 7)  Hydrochlorothiazide 12.5 Mg Caps (Hydrochlorothiazide) .Marland Kitchen.. 1po once daily 8)  Fexofenadine Hcl 180 Mg Tabs (Fexofenadine hcl) .Marland Kitchen.. 1 by mouth once daily as needed allergies 9)  Oxycodone Hcl 5 Mg Tabs (Oxycodone hcl) .Marland Kitchen.. 1-2 by mouth q 6 hrs as needed 10)  Zyrtec Allergy 10 Mg Tabs (Cetirizine hcl) .Marland Kitchen.. 1 by mouth qd 11)  Sertraline Hcl 100 Mg Tabs (Sertraline hcl) .Marland Kitchen.. 1 by mouth once daily  Patient Instructions: 1)  Please take all new medications as prescribed  - the generic zoloft is started at HALF pill for 3 days, then full pill per day after that 2)  Continue all previous medications as before this visit 3)  You will be contacted about the referral(s) to: MRI for the head 4)  Please schedule a follow-up appointment in 3 weeks, or sooner if needed Prescriptions: SERTRALINE HCL 100 MG TABS (SERTRALINE HCL) 1 by mouth once daily  #90 x 3   Entered and Authorized by:   Biagio Borg MD   Signed by:   Biagio Borg MD on 06/28/2010   Method used:   Electronically to        [facility] (retail)       [place], [place]  [id number]       Ph: [phone]       Fax: [phone]   RxIDVW:[id number] SERTRALINE HCL 100 MG TABS (SERTRALINE HCL) 1 by mouth once daily  #90 x 3   Entered and Authorized by:   Biagio Borg MD   Signed  by:   Biagio Borg MD on 06/28/2010   Method used:   Print then Give to Patient   RxID:  [id number]    Orders Added: 1)  Radiology Referral [Radiology] 2)  Est. Patient Level IV [id number]

## 2010-09-12 NOTE — Progress Notes (Signed)
Summary: Aciphex  Phone Note Call from Patient Call back at Home Phone [phone]   Caller: Patient Call For: Dr. Deatra Ina Reason for Call: Refill Medication Summary of Call: Aciphex... [facility] Initial call taken by: Lucien Mons,  November 23, 2009 2:58 PM  Follow-up for Phone Call        I have left a message on the patient's voicemail that Dr Jenny Reichmann gave him #90 with 3 refills on 09/16/09 and that he should have enough medication until 09/16/10. I have also noted that if patient has lost his prescription, he should call Dr Gwynn Burly office as he has not seen [place] in over 2 years. Should he wish to get a prescription from our office, he will need to be seen prior to that. Patient to call our office with any questions. Follow-up by: Awilda Bill CMA Deborra Medina),  November 23, 2009 3:56 PM

## 2010-09-12 NOTE — Assessment & Plan Note (Signed)
Summary: stomach pain when eating/past hernia/lb   Vital Signs:  Patient profile:   65 year old male Height:      70 inches Weight:      295.25 pounds BMI:     42.52 O2 Sat:      96 % on Room air Temp:     97 degrees F oral Pulse rate:   81 / minute BP sitting:   128 / 66  (left arm) Cuff size:   large  Vitals Entered ByShirlean Mylar Ewing (November 21, 2009 3:49 PM)  O2 Flow:  Room air  CC: Stomach Pain/RE   Primary Care Provider:  Cathlean Cower, MD  CC:  Stomach Pain/RE.  History of Present Illness: here with 4 wks intermittent midl to mod but grad worse upper mid abd discomfort worse with eating or drinking, pepto bismol not help; did not try antacid;  no wt loss, or vomit but has nausea;  seemed to radiate one time to the left side and back, worse to lie down;  has been more constipated and bloated recently - usual BM is 2 to 3 times, but lately once only per day;  states does eat "a lot"  but admits to more high carb and higher fat diet.  No fever, or change in bladder function; still takes flomax.  Stopped the aciphex sometime late 2010.  Has some dysphagia to start to eat solids, but later in the meal less so., but still gets full quick. No n/v, or BRBPR.  Pt denies CP, sob, doe, wheezing, orthopnea, pnd, worsening LE edema, palps, dizziness or syncope  Pt denies new neuro symptoms such as headache, facial or extremity weakness   Problems Prior to Update: 1)  Abdominal Pain, Epigastric  (ICD-789.06) 2)  Other Dysphagia  (ICD-787.29) 3)  Back Pain  (ICD-724.5) 4)  Preventive Health Care  (ICD-V70.0) 5)  Polymyalgia Rheumatica  (ICD-725) 6)  Chest Pain  (ICD-786.50) 7)  Polyarthralgia  (ICD-719.49) 8)  Esophageal Stricture  (ICD-530.3) 9)  Chest Pain  (ICD-786.50) 10)  Hepatotoxicity, Drug-induced, Risk of  (ICD-V58.69) 11)  Dysphagia Unspecified  (ICD-787.20) 12)  Hemorrhoids, Recurrent  (ICD-455.6) 13)  Preventive Health Care  (ICD-V70.0) 14)  Diverticulosis, Colon   (ICD-562.10) 15)  Colonic Polyps, Hx of  (ICD-V12.72) 16)  Asthma  (ICD-493.90) 17)  Depression  (ICD-311) 18)  Gerd  (ICD-530.81) 19)  Hyperlipidemia  (ICD-272.4) 20)  Lung Nodule  (ICD-518.89) 21)  Sleep Apnea, Obstructive, Moderate  (ICD-327.23) 22)  Benign Prostatic Hypertrophy  (ICD-600.00) 23)  Erectile Dysfunction  (ICD-302.72) 24)  Low Back Pain  (ICD-724.2) 25)  Osteoarthritis  (ICD-715.90) 26)  Allergic Rhinitis  (ICD-477.9) 27)  Degeneration, Cervical Disc  (ICD-722.4) 28)  Disc Disease, Cervical  (ICD-722.4) 29)  Degenerative Joint Disease  (ICD-715.90) 30)  Tb Skin Test, Positive  (ICD-795.5) 31)  Obesity  (ICD-278.00) 32)  Hypertension  (ICD-401.9) 33)  Chickenpox, Hx of  (ICD-V15.9)  Medications Prior to Update: 1)  Norvasc 10 Mg  Tabs (Amlodipine Besylate) .Marland Kitchen.. 1 By Mouth Once Daily 2)  Flomax 0.4 Mg  Cp24 (Tamsulosin Hcl) .Marland Kitchen.. 1 By Mouth Once Daily - Generic 3)  Lisinopril-Hydrochlorothiazide 20-12.5 Mg  Tabs (Lisinopril-Hydrochlorothiazide) .Marland Kitchen.. 1po Once Daily 4)  Robaxin-750 750 Mg Tabs (Methocarbamol) .... Take 1 Tablet By Mouth Twice A Day As Needed 5)  Nasacort Aq 55 Mcg/act  Aers (Triamcinolone Acetonide(Nasal)) .... 2 Spray/side Once Daily As Needed Allergies 6)  Aciphex 20 Mg  Tbec (Rabeprazole Sodium) .Marland Kitchen.. 1 By Mouth  Once Daily 7)  Cephalexin 500 Mg Caps (Cephalexin) .Marland Kitchen.. 1 By Mouth Three Times A Day 8)  Aspir-Low 81 Mg Tbec (Aspirin) .Marland Kitchen.. 1po Once Daily  Current Medications (verified): 1)  Norvasc 10 Mg  Tabs (Amlodipine Besylate) .Marland Kitchen.. 1 By Mouth Once Daily 2)  Flomax 0.4 Mg  Cp24 (Tamsulosin Hcl) .Marland Kitchen.. 1 By Mouth Once Daily - Generic 3)  Lisinopril-Hydrochlorothiazide 20-12.5 Mg  Tabs (Lisinopril-Hydrochlorothiazide) .Marland Kitchen.. 1po Once Daily 4)  Robaxin-750 750 Mg Tabs (Methocarbamol) .... Take 1 Tablet By Mouth Twice A Day As Needed 5)  Nasacort Aq 55 Mcg/act  Aers (Triamcinolone Acetonide(Nasal)) .... 2 Spray/side Once Daily As Needed Allergies 6)   Omeprazole 20 Mg Cpdr (Omeprazole) .... 2po Once Daily 7)  Aspir-Low 81 Mg Tbec (Aspirin) .Marland Kitchen.. 1po Once Daily  Allergies (verified): 1)  ! 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   Risk Factors: Exercise: no (03/23/2008)  Risk Factors: Smoking Status: quit (12/04/2007)  Review of Systems       all otherwise negative per pt -    Physical Exam  General:  alert and overweight-appearing.   Head:  normocephalic and atraumatic.   Eyes:  vision grossly intact, pupils equal, and pupils round.   Ears:  R ear normal and L ear normal.   Nose:  no external deformity and no nasal discharge.   Mouth:  no gingival abnormalities and pharynx pink and moist.   Neck:  supple and no masses.   Lungs:  normal respiratory effort and normal breath sounds.   Heart:  normal rate and regular rhythm.   Abdomen:  soft and normal bowel sounds.  and mild epigastric tender, but no guarding or rebound Extremities:  no edema, no erythema    Impression & Recommendations:  Problem # 1:  OTHER DYSPHAGIA (ICD-787.29)  prob recurrent stricture - to refer dr Deatra Ina - ? need repeat egd  Orders: Gastroenterology Referral (GI)  Problem # 2:  ABDOMINAL PAIN, EPIGASTRIC  (ICD-789.06) ? simple reflux off the aciphex vs other such as pancreatitis, GB or other - for labs and check u/s Orders: Radiology Referral (Radiology) Gastroenterology Referral (GI) TLB-BMP (Basic Metabolic Panel-BMET) (99991111) TLB-CBC Platelet - w/Differential (85025-CBCD) TLB-Hepatic/Liver Function Pnl (80076-HEPATIC) TLB-Lipase (83690-LIPASE) TLB-Udip ONLY (81003-UDIP) TLB-Sedimentation Rate (ESR) (85652-ESR)  Problem # 3:  GERD (ICD-530.81)  His updated medication list for this problem includes:    Omeprazole 20 Mg Cpdr (Omeprazole) .Marland Kitchen... 2po once daily treat as above, f/u any worsening signs or symptoms  - change from aciphex  Problem # 4:  HYPERTENSION (ICD-401.9)  His updated medication list for this problem includes:    Norvasc 10 Mg Tabs (Amlodipine besylate) .Marland Kitchen... 1 by mouth once daily    Lisinopril-hydrochlorothiazide 20-12.5 Mg Tabs (Lisinopril-hydrochlorothiazide) .Marland Kitchen... 1po once daily  BP today: 128/66 Prior BP: 114/68 (09/16/2009)  Labs Reviewed: K+: 4.0 (09/16/2009) Creat: : 1.9 (09/16/2009)   Chol: 202 (09/16/2009)  HDL: 47.20 (09/16/2009)   LDL: 111 (03/23/2008)   TG: 55.0 (09/16/2009) stable overall by hx and exam, ok to continue meds/tx as is   Complete Medication List: 1)  Norvasc 10 Mg Tabs (Amlodipine besylate) .Marland Kitchen.. 1 by mouth once daily 2)  Flomax 0.4 Mg Cp24 (Tamsulosin hcl) .Marland Kitchen.. 1 by mouth once daily - generic 3)  Lisinopril-hydrochlorothiazide 20-12.5 Mg Tabs (Lisinopril-hydrochlorothiazide) .Marland Kitchen.. 1po once daily 4)  Robaxin-750 750 Mg Tabs (Methocarbamol) .... Take 1 tablet by mouth twice a day as needed 5)  Nasacort Aq 55 Mcg/act Aers (Triamcinolone acetonide(nasal)) .... 2 spray/side once daily as needed allergies 6)  Omeprazole 20 Mg Cpdr (Omeprazole) .... 2po once daily 7)  Aspir-low 81 Mg Tbec (Aspirin) .Marland Kitchen.. 1po once daily  Patient Instructions: 1)  Please take all new medications as prescribed - the omeprazole 2 per day 2)  Please go  to the Lab in the basement for your blood and/or urine tests today 3)  You will be contacted about the referral(s) to: ultrasound, and Dr Deatra Ina  Prescriptions: OMEPRAZOLE 20 MG CPDR (OMEPRAZOLE) 2po once daily  #60 x 11   Entered and Authorized by:   Biagio Borg MD   Signed by:   Biagio Borg MD on 11/21/2009   Method used:   Electronically to        [facility] (retail)       [address]       [place], [place]  [id number]       Ph: [phone]       Fax: [phone]   RxID:   [id number]

## 2010-09-12 NOTE — Progress Notes (Signed)
Summary: MRI medication  Phone Note Call from Patient Call back at Home Phone [phone]   Caller: Patient Summary of Call: Pt called stating he is to have MRI 12/02 @ 8am but will need medication to sedate prior to MRI. [facility] on [address] Initial call taken by: Crissie Sickles, [place],  July 12, 2010 3:57 PM  Follow-up for Phone Call        done hardcopy to LIM side B - dahlia  Follow-up by: Biagio Borg MD,  July 12, 2010 5:12 PM  Additional Follow-up for Phone Call Additional follow up Details #1::        pt informed, Rx in cabinet for pt pick up Additional Follow-up by: Crissie Sickles, [place],  July 13, 2010 8:24 AM    New/Updated Medications: DIAZEPAM 5 MG TABS (DIAZEPAM) 1 by mouth x 1  - to take 30 min prior to procedure dec 2 Prescriptions: DIAZEPAM 5 MG TABS (DIAZEPAM) 1 by mouth x 1  - to take 30 min prior to procedure dec 2  #1 x 0   Entered and Authorized by:   Biagio Borg MD   Signed by:   Biagio Borg MD on 07/12/2010   Method used:   Print then Give to Patient   RxID:   [id number]

## 2010-09-12 NOTE — Progress Notes (Signed)
Summary: ALT med  Phone Note From Pharmacy   Caller: [id number] Summary of Call: Pharmacy called stating that Allegra is on Back Order. Pt is requesting new Rx for Zyrtec. Initial call taken by: Crissie Sickles, [place],  March 30, 2010 1:49 PM  Follow-up for Phone Call        ok for zyrtec 10 mg  - to [facility]  Follow-up by: Biagio Borg MD,  March 30, 2010 1:54 PM    New/Updated Medications: ZYRTEC ALLERGY 10 MG TABS (CETIRIZINE HCL) 1 by mouth qd Prescriptions: ZYRTEC ALLERGY 10 MG TABS (CETIRIZINE HCL) 1 by mouth qd  #30 x 5   Entered by:   Sharon Seller CMA ([place])   Authorized by:   Biagio Borg MD   Signed by:   Sharon Seller CMA ([place]) on 03/30/2010   Method used:   Faxed to ...       [id number] (retail)       [place]       [place], [place]  [id number]       Ph: [phone]       Fax: [phone]   RxID:   [id number]

## 2010-09-12 NOTE — Assessment & Plan Note (Signed)
Summary: back pain/medicare,medicaid - regular NOT [place] access/cd   Vital Signs:  Patient profile:   65 year old male Height:      69 inches Weight:      292 pounds BMI:     43.28 O2 Sat:      96 % on Room air Temp:     97.3 degrees F oral Pulse rate:   76 / minute BP sitting:   114 / 68  (left arm) Cuff size:   large  Vitals Entered ByMarland Kitchen Shirlean Mylar Ewing (September 16, 2009 1:52 PM)  O2 Flow:  Room air  CC: back pain,headache, congestion,nauseated/RE   Primary Care Provider:  Cathlean Cower, MD  CC:  back pain, headache, congestion, and nauseated/RE.  History of Present Illness: here for wellness, incidetnly with 2 days onset fever, headache, sT and prod cough marked greenish sputum, but Pt denies CP, sob, doe, wheezing, orthopnea, pnd, worsening LE edema, palps, dizziness or syncope   also Pt denies new neuro symptoms such as headache, facial or extremity weakness   Does have c/o other back pain to the area jsut medial to the right scapula area off and on for one year without trauma, swelling, redness or rash, non pleuritic and not obviously worse with movement of the right shoulder.  Worse discomfort to lie on it at night;  no other fever, wt loss, night sweats, bowel or bladder change, or LE pain, weakness or numbness.    Problems Prior to Update: 1)  Back Pain  (ICD-724.5) 2)  Bronchitis-acute  (ICD-466.0) 3)  Preventive Health Care  (ICD-V70.0) 4)  Polymyalgia Rheumatica  (ICD-725) 5)  Chest Pain  (ICD-786.50) 6)  Polyarthralgia  (ICD-719.49) 7)  Esophageal Stricture  (ICD-530.3) 8)  Chest Pain  (ICD-786.50) 9)  Hepatotoxicity, Drug-induced, Risk of  (ICD-V58.69) 10)  Dysphagia Unspecified  (ICD-787.20) 11)  Hemorrhoids, Recurrent  (ICD-455.6) 12)  Preventive Health Care  (ICD-V70.0) 13)  Diverticulosis, Colon  (ICD-562.10) 14)  Colonic Polyps, Hx of  (ICD-V12.72) 15)  Asthma  (ICD-493.90) 16)  Depression  (ICD-311) 17)  Gerd  (ICD-530.81) 18)  Hyperlipidemia   (ICD-272.4) 19)  Lung Nodule  (ICD-518.89) 20)  Sleep Apnea, Obstructive, Moderate  (ICD-327.23) 21)  Benign Prostatic Hypertrophy  (ICD-600.00) 22)  Erectile Dysfunction  (ICD-302.72) 23)  Low Back Pain  (ICD-724.2) 24)  Osteoarthritis  (ICD-715.90) 25)  Allergic Rhinitis  (ICD-477.9) 26)  Degeneration, Cervical Disc  (ICD-722.4) 27)  Disc Disease, Cervical  (ICD-722.4) 28)  Degenerative Joint Disease  (ICD-715.90) 29)  Tb Skin Test, Positive  (ICD-795.5) 30)  Obesity  (ICD-278.00) 31)  Hypertension  (ICD-401.9) 32)  Chickenpox, Hx of  (ICD-V15.9)  Medications Prior to Update: 1)  Norvasc 10 Mg  Tabs (Amlodipine Besylate) .Marland Kitchen.. 1 By Mouth Once Daily 2)  Flomax 0.4 Mg  Cp24 (Tamsulosin Hcl) .Marland Kitchen.. 1 By Mouth Once Daily 3)  Lisinopril-Hydrochlorothiazide 20-12.5 Mg  Tabs (Lisinopril-Hydrochlorothiazide) .Marland Kitchen.. 1po Qd 4)  Lisinopril 20 Mg Tabs (Lisinopril) .... Take 1 Tablet By Mouth Once A Day 5)  Robaxin-750 750 Mg Tabs (Methocarbamol) .... Take 1 Tablet By Mouth Twice A Day 6)  Viagra 100 Mg Tabs (Sildenafil Citrate) .... Take 1 Tablet By Mouth Once A Day 7)  Cetirizine Hcl 10 Mg  Tabs (Cetirizine Hcl) .Marland Kitchen.. 1 By Mouth Once Daily As Needed 8)  Anusol-Hc 25 Mg  Supp (Hydrocortisone Acetate) .Marland Kitchen.. 1 Pr Two Times A Day 9)  Nasacort Aq 55 Mcg/act  Aers (Triamcinolone Acetonide(Nasal)) .... 2 Spray/side Once Daily  10)  Simvastatin 40 Mg  Tabs (Simvastatin) .Marland Kitchen.. 1 By Mouth Once Daily 11)  Aciphex 20 Mg  Tbec (Rabeprazole Sodium) .Marland Kitchen.. 1 By Mouth Once Daily 12)  Prednisone 10 Mg Tabs (Prednisone) .Marland Kitchen.. 1 By Mouth Once Daily 13)  Darvocet-N 100 100-650 Mg Tabs (Propoxyphene N-Apap) .Marland Kitchen.. 1 By Mouth Four Times Per Day As Needed Pain 14)  Chlorpromazine Hcl 25 Mg Tabs (Chlorpromazine Hcl) .Marland Kitchen.. 1 By Mouth Four Times Per Day As Needed Hiccups  Current Medications (verified): 1)  Norvasc 10 Mg  Tabs (Amlodipine Besylate) .Marland Kitchen.. 1 By Mouth Once Daily 2)  Flomax 0.4 Mg  Cp24 (Tamsulosin Hcl) .Marland Kitchen.. 1 By Mouth  Once Daily - Generic 3)  Lisinopril-Hydrochlorothiazide 20-12.5 Mg  Tabs (Lisinopril-Hydrochlorothiazide) .Marland Kitchen.. 1po Once Daily 4)  Robaxin-750 750 Mg Tabs (Methocarbamol) .... Take 1 Tablet By Mouth Twice A Day As Needed 5)  Nasacort Aq 55 Mcg/act  Aers (Triamcinolone Acetonide(Nasal)) .... 2 Spray/side Once Daily As Needed Allergies 6)  Aciphex 20 Mg  Tbec (Rabeprazole Sodium) .Marland Kitchen.. 1 By Mouth Once Daily 7)  Azithromycin 250 Mg Tabs (Azithromycin) .... 2po Qd For 1 Day, Then 1po Qd For 4days, Then Stop 8)  Aspir-Low 81 Mg Tbec (Aspirin) .Marland Kitchen.. 1po Once Daily  Allergies (verified): 1)  ! * Hctz  Past History:  Past Medical History: Last updated: 07/26/2008 Hypertension Allergic rhinitis Obesity Osteoarthritis Low back pain Hx of Positive PPD Erectile Dysfunction Benign prostatic hypertrophy Obstructive Sleep Apnea Hyperlipidemia c-spine disc disease GERD Depression Asthma Colonic polyps, hx of - adenomatous Diverticulosis, colon variable medical compliance PMR - probable  Past Surgical History: Last updated: 02/11/2008 Total knee replacement Rotator cuff repair - left  Family History: Last updated: 12/04/2007 asthma father with heart disease mother with brain tumor brother with lupus HTN  Social History: Last updated: 03/23/2008 Married seperated 6 children - 1 died with suicide, 1 with 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   Risk Factors: Exercise: no (03/23/2008)  Risk Factors: Smoking Status: quit (12/04/2007)  Review of Systems  The patient denies anorexia, fever, weight loss, weight gain, vision loss, decreased hearing, hoarseness, chest pain, syncope, dyspnea on exertion, peripheral edema, prolonged cough, headaches, hemoptysis, abdominal pain, melena, hematochezia, severe indigestion/heartburn, hematuria, incontinence,  muscle weakness, suspicious skin lesions, transient blindness, difficulty walking, depression, unusual weight change, abnormal bleeding, enlarged lymph nodes, and angioedema.         all otherwise negative per pt - 12 system review done   Physical Exam  General:  alert and overweight-appearing.   Head:  normocephalic and atraumatic.   Eyes:  vision grossly intact, pupils equal, and pupils round.   Ears:  bilat tm's red, sinus nontender Nose:  nasal dischargemucosal pallor and mucosal erythema.   Mouth:  pharyngeal erythema and fair dentition.   Neck:  supple and cervical lymphadenopathy.   Lungs:  normal respiratory effort, R decreased breath sounds, and L decreased breath sounds.  but no wheezes or rales Heart:  normal rate and regular rhythm.   Abdomen:  soft, non-tender, and normal bowel sounds.   Msk:  no joint tenderness and no joint swelling.  , spine nontender throughout,  scapula nontender,  and no significnat muscluar tender, sweling or rash to the right medial periscapular area Extremities:  no edema, no erythema  Neurologic:  cranial nerves II-XII intact and strength normal in all extremities.  Impression & Recommendations:  Problem # 1:  Preventive Health Care (ICD-V70.0)  Overall doing well, age appropriate education and counseling updated and referral for appropriate preventive services done unless declined, immunizations up to date or declined, diet counseling done if overweight, urged to quit smoking if smokes , most recent labs reviewed and current ordered if appropriate, ecg reviewed or declined (interpretation per ECG scanned in the EMR if done); information regarding Medicare Prevention requirements given if appropriate   Orders: TLB-BMP (Basic Metabolic Panel-BMET) (99991111) TLB-CBC Platelet - w/Differential (85025-CBCD) TLB-Hepatic/Liver Function Pnl (80076-HEPATIC) TLB-Lipid Panel (80061-LIPID) TLB-TSH (Thyroid Stimulating Hormone) (84443-TSH) TLB-PSA  (Prostate Specific Antigen) (84153-PSA) TLB-Udip ONLY (81003-UDIP)  Problem # 2:  BRONCHITIS-ACUTE (ICD-466.0)  His updated medication list for this problem includes:    Azithromycin 250 Mg Tabs (Azithromycin) .Marland Kitchen... 2po qd for 1 day, then 1po qd for 4days, then stop  Orders: T-2 View CXR, Same Day (C9260230.5TC) treat as above, f/u any worsening signs or symptoms , cant r/o pna - to check cxr  Problem # 3:  BACK PAIN (ICD-724.5)  The following medications were removed from the medication list:    Darvocet-n 100 100-650 Mg Tabs (Propoxyphene n-apap) .Marland Kitchen... 1 by mouth four times per day as needed pain His updated medication list for this problem includes:    Robaxin-750 750 Mg Tabs (Methocarbamol) .Marland Kitchen... Take 1 tablet by mouth twice a day as needed    Aspir-low 81 Mg Tbec (Aspirin) .Marland Kitchen... 1po once daily treat as above, f/u any worsening signs or symptoms , to check t-spine film  Orders: T-Thoracic Spine 2 Views [phone])  Problem # 4:  HYPERTENSION (ICD-401.9)  The following medications were removed from the medication list:    Lisinopril 20 Mg Tabs (Lisinopril) .Marland Kitchen... Take 1 tablet by mouth once a day His updated medication list for this problem includes:    Norvasc 10 Mg Tabs (Amlodipine besylate) .Marland Kitchen... 1 by mouth once daily    Lisinopril-hydrochlorothiazide 20-12.5 Mg Tabs (Lisinopril-hydrochlorothiazide) .Marland Kitchen... 1po once daily  BP today: 114/68 Prior BP: 140/92 (07/26/2008)  Labs Reviewed: K+: 4.1 (02/11/2008) Creat: : 1.6 (02/11/2008)   Chol: 178 (03/23/2008)   HDL: 56.8 (03/23/2008)   LDL: 111 (03/23/2008)   TG: 52 (03/23/2008) stable overall by hx and exam, ok to continue meds/tx as is   Complete Medication List: 1)  Norvasc 10 Mg Tabs (Amlodipine besylate) .Marland Kitchen.. 1 by mouth once daily 2)  Flomax 0.4 Mg Cp24 (Tamsulosin hcl) .Marland Kitchen.. 1 by mouth once daily - generic 3)  Lisinopril-hydrochlorothiazide 20-12.5 Mg Tabs (Lisinopril-hydrochlorothiazide) .Marland Kitchen.. 1po once daily 4)  Robaxin-750  750 Mg Tabs (Methocarbamol) .... Take 1 tablet by mouth twice a day as needed 5)  Nasacort Aq 55 Mcg/act Aers (Triamcinolone acetonide(nasal)) .... 2 spray/side once daily as needed allergies 6)  Aciphex 20 Mg Tbec (Rabeprazole sodium) .Marland Kitchen.. 1 by mouth once daily 7)  Azithromycin 250 Mg Tabs (Azithromycin) .... 2po qd for 1 day, then 1po qd for 4days, then stop 8)  Aspir-low 81 Mg Tbec (Aspirin) .Marland Kitchen.. 1po once daily  Patient Instructions: 1)  Please go to the Lab in the basement for your blood and/or urine tests today 2)  Please take all new medications as prescribed - the antibiotic and cough medicine 3)  Continue all previous medications as before this visit  4)  Take an Aspirin every day - 81 mg - 1 per day - COATED only (VERY important, to help reduce risk of heart attack and stroke) 5)  Please go to Radiology  in the basement level for your X-Ray today  6)  Please schedule a follow-up appointment in 6 months. Prescriptions: ACIPHEX 20 MG  TBEC (RABEPRAZOLE SODIUM) 1 by mouth once daily  #90 x 3   Entered and Authorized by:   Biagio Borg MD   Signed by:   Biagio Borg MD on 09/16/2009   Method used:   Print then Give to Patient   RxID:   [id number] NASACORT AQ 55 MCG/ACT  AERS (TRIAMCINOLONE ACETONIDE(NASAL)) 2 spray/side once daily as needed allergies  #1 x 11   Entered and Authorized by:   Biagio Borg MD   Signed by:   Biagio Borg MD on 09/16/2009   Method used:   Print then Give to Patient   RxID:   [id number] LISINOPRIL-HYDROCHLOROTHIAZIDE 20-12.5 MG  TABS (LISINOPRIL-HYDROCHLOROTHIAZIDE) 1po once daily  #90 x 3   Entered and Authorized by:   Biagio Borg MD   Signed by:   Biagio Borg MD on 09/16/2009   Method used:   Print then Give to Patient   RxID:   [id number] FLOMAX 0.4 MG  CP24 (TAMSULOSIN HCL) 1 by mouth once daily - generic  #90 x 3   Entered and Authorized by:   Biagio Borg MD   Signed by:   Biagio Borg MD on 09/16/2009   Method used:   Print  then Give to Patient   RxID:   [id number] [place] 10 MG  TABS (AMLODIPINE BESYLATE) 1 by mouth once daily  #90 x 3   Entered and Authorized by:   Biagio Borg MD   Signed by:   Biagio Borg MD on 09/16/2009   Method used:   Print then Give to Patient   RxID:   [id number] ROBAXIN-750 750 MG TABS (METHOCARBAMOL) Take 1 tablet by mouth twice a day as needed  #60 x 1   Entered and Authorized by:   Biagio Borg MD   Signed by:   Biagio Borg MD on 09/16/2009   Method used:   Print then Give to Patient   RxID:   [id number] AZITHROMYCIN 250 MG TABS (AZITHROMYCIN) 2po qd for 1 day, then 1po qd for 4days, then stop  #6 x 1   Entered and Authorized by:   Biagio Borg MD   Signed by:   Biagio Borg MD on 09/16/2009   Method used:   Print then Give to Patient   RxID:   [id number]

## 2010-09-12 NOTE — Consult Note (Signed)
Summary: [facility]  [facility]   Imported By: Rise Patience 03/16/2010 11:11:04  _____________________________________________________________________  External Attachment:    Type:   Image     Comment:   External Document

## 2010-09-12 NOTE — Letter (Signed)
Summary: Appt Reminder [facility]  [place], [place] [address]   Phone: [phone]  Fax: [phone]        December 09, 2009 MRN: [id number]    [facility] [address] [place], [place]  [address]    Dear Troy Palmer,   You have an appointment with Dr.Robert Deatra Ina on 12-21-09 at 11am.  Please remember to bring a complete list of the medicines you are taking, your insurance card and your co-pay.  If you have to cancel or reschedule this appointment, please call before 5:00 pm the evening before to avoid a cancellation fee.  If you have any questions or concerns, please call [phone].    Sincerely,    Troy Ewing LPN  Appended Document: Appt Reminder 2 Letter mailed to patient.

## 2010-09-12 NOTE — Assessment & Plan Note (Signed)
Summary: 2 WK F/U APPT / # / CD   Vital Signs:  Patient profile:   65 year old male Height:      70 inches Weight:      291.25 pounds BMI:     41.94 O2 Sat:      97 % on Room air Temp:     97.1 degrees F oral Pulse rate:   71 / minute BP sitting:   122 / 70  (left arm) Cuff size:   large  Vitals Entered By: Shirlean Mylar Ewing CMA Deborra Medina) (March 30, 2010 12:09 PM)  O2 Flow:  Room air CC: 2 week followup/RE   Primary Care Provider:  Cathlean Cower, MD  CC:  2 week followup/RE.  History of Present Illness: here with c/o 3 wks post neck pain, right more than left with some radiation on the right to the right upper back towards the shoulder but no further, and no UE pain, weakness, or numnbess.  Has hx of lower back problems, but not of neck prior, and no recent imaging, trauma, gait chagne, falls or injury;  also no bowel or bladder change, other back pain or LE pain/weak/numb.  Pt denies CP, worsening sob, doe, wheezing, orthopnea, pnd, worsening LE edema, palps, dizziness or syncope  Pt denies new neuro symptoms such as headache, facial or extremity weakness  No fever, wt loss, night sweats, loss of appetite or other constitutional symptoms Pain also radiates somewhat to the occiput right > left as well.  Also with several weeks nasal allergy symptoms without facial pain, pressure or d/c.   Problems Prior to Update: 1)  Neck Pain  (ICD-723.1) 2)  Renal Insufficiency  (ICD-588.9) 3)  Abdominal Pain, Lower  (ICD-789.09) 4)  Uti  (ICD-599.0) 5)  Uri  (ICD-465.9) 6)  Pvd With Claudication  (ICD-443.89) 7)  Leg Pain, Bilateral  (ICD-729.5) 8)  Chest Pain  (ICD-786.50) 9)  Abdominal Pain, Epigastric  (ICD-789.06) 10)  Other Dysphagia  (ICD-787.29) 11)  Back Pain  (ICD-724.5) 12)  Preventive Health Care  (ICD-V70.0) 13)  Polymyalgia Rheumatica  (ICD-725) 14)  Chest Pain  (ICD-786.50) 15)  Polyarthralgia  (ICD-719.49) 16)  Esophageal Stricture  (ICD-530.3) 17)  Chest Pain  (ICD-786.50) 18)   Hepatotoxicity, Drug-induced, Risk of  (ICD-V58.69) 19)  Dysphagia Unspecified  (ICD-787.20) 20)  Hemorrhoids, Recurrent  (ICD-455.6) 21)  Preventive Health Care  (ICD-V70.0) 22)  Diverticulosis, Colon  (ICD-562.10) 23)  Colonic Polyps, Hx of  (ICD-V12.72) 24)  Asthma  (ICD-493.90) 25)  Depression  (ICD-311) 26)  Gerd  (ICD-530.81) 27)  Hyperlipidemia  (ICD-272.4) 28)  Lung Nodule  (ICD-518.89) 29)  Sleep Apnea, Obstructive, Moderate  (ICD-327.23) 30)  Benign Prostatic Hypertrophy  (ICD-600.00) 31)  Erectile Dysfunction  (ICD-302.72) 32)  Low Back Pain  (ICD-724.2) 33)  Osteoarthritis  (ICD-715.90) 34)  Allergic Rhinitis  (ICD-477.9) 35)  Degeneration, Cervical Disc  (ICD-722.4) 36)  Disc Disease, Cervical  (ICD-722.4) 37)  Degenerative Joint Disease  (ICD-715.90) 38)  Tb Skin Test, Positive  (ICD-795.5) 39)  Obesity  (ICD-278.00) 40)  Hypertension  (ICD-401.9)  Medications Prior to Update: 1)  Tamsulosin Hcl 0.4 Mg Caps (Tamsulosin Hcl) .Marland Kitchen.. 1po Once Daily 2)  Amlodipine Besylate 5 Mg Tabs (Amlodipine Besylate) .Marland Kitchen.. 1po Once Daily 3)  Robaxin-750 750 Mg Tabs (Methocarbamol) .... Take 1 Tablet By Mouth Twice A Day As Needed 4)  Nasacort Aq 55 Mcg/act  Aers (Triamcinolone Acetonide(Nasal)) .... 2 Spray/side Once Daily As Needed Allergies 5)  Omeprazole 20 Mg Cpdr (  Omeprazole) .... 2po Once Daily 6)  Aspir-Low 81 Mg Tbec (Aspirin) .Marland Kitchen.. 1po Once Daily 7)  Ciprofloxacin Hcl 500 Mg Tabs (Ciprofloxacin Hcl) .Marland Kitchen.. 1 By Mouth Two Times A Day 8)  Hydrochlorothiazide 12.5 Mg Caps (Hydrochlorothiazide) .Marland Kitchen.. 1po Once Daily  Current Medications (verified): 1)  Tamsulosin Hcl 0.4 Mg Caps (Tamsulosin Hcl) .Marland Kitchen.. 1po Once Daily 2)  Amlodipine Besylate 5 Mg Tabs (Amlodipine Besylate) .Marland Kitchen.. 1po Once Daily 3)  Flexeril 5 Mg Tabs (Cyclobenzaprine Hcl) .Marland Kitchen.. 1 By Mouth Three Times A Day As Needed 4)  Nasacort Aq 55 Mcg/act  Aers (Triamcinolone Acetonide(Nasal)) .... 2 Spray/side Once Daily As Needed  Allergies 5)  Omeprazole 20 Mg Cpdr (Omeprazole) .... 2po Once Daily 6)  Aspir-Low 81 Mg Tbec (Aspirin) .Marland Kitchen.. 1po Once Daily 7)  Hydrochlorothiazide 12.5 Mg Caps (Hydrochlorothiazide) .Marland Kitchen.. 1po Once Daily 8)  Fexofenadine Hcl 180 Mg Tabs (Fexofenadine Hcl) .Marland Kitchen.. 1 By Mouth Once Daily As Needed Allergies 9)  Oxycodone Hcl 5 Mg Tabs (Oxycodone Hcl) .Marland Kitchen.. 1-2 By Mouth Q 6 Hrs As Needed 10)  Prednisone 10 Mg Tabs (Prednisone) .... 3po Qd For 3days, Then 2po Qd For 3days, Then 1po Qd For 3days, Then Stop 11)  Zyrtec Allergy 10 Mg Tabs (Cetirizine Hcl) .Marland Kitchen.. 1 By Mouth Qd  Allergies (verified): No Known Drug Allergies  Past History:  Past Medical History: Last updated: 03/15/2010 Hypertension Allergic rhinitis Obesity Osteoarthritis Low back pain Hx of Positive PPD Erectile Dysfunction Benign prostatic hypertrophy Obstructive Sleep Apnea Hyperlipidemia c-spine disc disease GERD Depression Asthma Colonic polyps, hx of - adenomatous Diverticulosis, colon variable medical compliance PMR - probable DDD Renal insufficiency  Past Surgical History: Last updated: 12/16/2009 Total knee replacement Rotator cuff repair - left  Social History: Last updated: 03/23/2008 Married seperated 6 children - 1 died with suicide, 1 with 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   Risk Factors: Exercise: no (03/23/2008)  Risk Factors: Smoking Status: quit (12/04/2007)  Review of Systems       all otherwise negative per pt -    Physical Exam  General:  alert and overweight-appearing.   Head:  normocephalic and atraumatic.   Eyes:  vision grossly intact, pupils equal, and pupils round.   Ears:  R ear normal and L ear normal.   Nose:  no external deformity, nasal dischargemucosal pallor, and mucosal edema.   Mouth:  pharyngeal erythema and fair dentition.   Neck:   supple and no masses.   Lungs:  normal respiratory effort and normal breath sounds.   Heart:  normal rate and regular rhythm.   Msk:  cervical spine nontender;  has tender right > left paracervical without swelling , rash or erythema Extremities:  no edema, no erythema  Neurologic:  cranial nerves II-XII intact, strength normal in all extremities, sensation intact to light touch, and gait normal.   Skin:  no rashes.   Psych:  slightly anxious.     Impression & Recommendations:  Problem # 1:  NECK PAIN (ICD-723.1)  His updated medication list for this problem includes:    Flexeril 5 Mg Tabs (Cyclobenzaprine hcl) .Marland Kitchen... 1 by mouth three times a day as needed    Aspir-low 81 Mg Tbec (Aspirin) .Marland Kitchen... 1po once daily    Oxycodone Hcl 5 Mg Tabs (Oxycodone hcl) .Marland Kitchen... 1-2 by mouth q 6 hrs as needed suspect flare of underlying possible djd/ddd with pain radiating  to bilat post head, r>l;  treat as above, f/u any worsening signs or symptoms   Problem # 2:  ALLERGIC RHINITIS (ICD-477.9)  His updated medication list for this problem includes:    Nasacort Aq 55 Mcg/act Aers (Triamcinolone acetonide(nasal)) .Marland Kitchen... 2 spray/side once daily as needed allergies    Fexofenadine Hcl 180 Mg Tabs (Fexofenadine hcl) .Marland Kitchen... 1 by mouth once daily as needed allergies    Zyrtec Allergy 10 Mg Tabs (Cetirizine hcl) .Marland Kitchen... 1 by mouth qd for depo IM today, and allegra as needed   Problem # 3:  HYPERTENSION (ICD-401.9)  His updated medication list for this problem includes:    Amlodipine Besylate 5 Mg Tabs (Amlodipine besylate) .Marland Kitchen... 1po once daily    Hydrochlorothiazide 12.5 Mg Caps (Hydrochlorothiazide) .Marland Kitchen... 1po once daily  BP today: 122/70 Prior BP: 112/62 (03/15/2010)  Labs Reviewed: K+: 4.6 (03/20/2010) Creat: : 1.8 (03/20/2010)   Chol: 202 (09/16/2009)   HDL: 47.20 (09/16/2009)   LDL: 111 (03/23/2008)   TG: 55.0 (09/16/2009) stable overall by hx and exam, ok to continue meds/tx as is   Complete  Medication List: 1)  Tamsulosin Hcl 0.4 Mg Caps (Tamsulosin hcl) .Marland Kitchen.. 1po once daily 2)  Amlodipine Besylate 5 Mg Tabs (Amlodipine besylate) .Marland Kitchen.. 1po once daily 3)  Flexeril 5 Mg Tabs (Cyclobenzaprine hcl) .Marland Kitchen.. 1 by mouth three times a day as needed 4)  Nasacort Aq 55 Mcg/act Aers (Triamcinolone acetonide(nasal)) .... 2 spray/side once daily as needed allergies 5)  Omeprazole 20 Mg Cpdr (Omeprazole) .... 2po once daily 6)  Aspir-low 81 Mg Tbec (Aspirin) .Marland Kitchen.. 1po once daily 7)  Hydrochlorothiazide 12.5 Mg Caps (Hydrochlorothiazide) .Marland Kitchen.. 1po once daily 8)  Fexofenadine Hcl 180 Mg Tabs (Fexofenadine hcl) .Marland Kitchen.. 1 by mouth once daily as needed allergies 9)  Oxycodone Hcl 5 Mg Tabs (Oxycodone hcl) .Marland Kitchen.. 1-2 by mouth q 6 hrs as needed 10)  Prednisone 10 Mg Tabs (Prednisone) .... 3po qd for 3days, then 2po qd for 3days, then 1po qd for 3days, then stop 11)  Zyrtec Allergy 10 Mg Tabs (Cetirizine hcl) .Marland Kitchen.. 1 by mouth qd  Other Orders: Depo- Medrol 40mg  (J1030) Depo- Medrol 80mg  (J1040) Admin of Therapeutic Inj  intramuscular or subcutaneous [id number])  Patient Instructions: 1)  you had the steroid shot today 2)  Please take all new medications as prescribed - the allegra, pain medication, prednisone (low dose), and muscle relaxer 3)  Continue all previous medications as before this visit  4)  please return Feb 2012 with CPX labs Prescriptions: PREDNISONE 10 MG TABS (PREDNISONE) 3po qd for 3days, then 2po qd for 3days, then 1po qd for 3days, then stop  #18 x 0   Entered and Authorized by:   Biagio Borg MD   Signed by:   Biagio Borg MD on 03/30/2010   Method used:   Print then Give to Patient   RxID:   [id number] FLEXERIL 5 MG TABS (CYCLOBENZAPRINE HCL) 1 by mouth three times a day as needed  #60 x 0   Entered and Authorized by:   Biagio Borg MD   Signed by:   Biagio Borg MD on 03/30/2010   Method used:   Print then Give to Patient   RxID:   [id number] OXYCODONE HCL 5 MG TABS  (OXYCODONE HCL) 1-2 by mouth q 6 hrs as needed  #50 x 0   Entered and Authorized by:   Biagio Borg MD   Signed by:   Biagio Borg MD  on 03/30/2010   Method used:   Print then Give to Patient   RxID:   [id number] FEXOFENADINE HCL 180 MG TABS (FEXOFENADINE HCL) 1 by mouth once daily as needed allergies  #30 x 5   Entered and Authorized by:   Biagio Borg MD   Signed by:   Biagio Borg MD on 03/30/2010   Method used:   Print then Give to Patient   RxID:   [id number]    Medication Administration  Injection # 1:    Medication: Depo- Medrol 80mg     Diagnosis: URI (E6763768.9)    Route: IM    Site: R deltoid    Exp Date: 11/11/2012    Lot #: [id number]    Mfr: Pharmacia    Patient tolerated injection without complications    Given by: Charlsie Quest, CMA (March 30, 2010 12:42 PM)  Injection # 2:    Medication: Depo- Medrol 40mg     Diagnosis: URI (ICD-465.9)    Comments: Same as above, pt recieved 120mg  injection    Patient tolerated injection without complications    Given by: Charlsie Quest, CMA (March 30, 2010 12:43 PM)  Orders Added: 1)  Depo- Medrol 40mg  [J1030] 2)  Depo- Medrol 80mg  [J1040] 3)  Admin of Therapeutic Inj  intramuscular or subcutaneous [[id number]] 4)  Est. Patient Level IV [id number]

## 2010-09-12 NOTE — Assessment & Plan Note (Signed)
Summary: f/u appt/medicare/#/cd   Primary Care Provider:  Cathlean Cower, MD   History of Present Illness: pt left/not seen as he realized he has [employer] Access insurance with which we do not participate  Allergies: 1)  ! * Hctz   Impression & Recommendations:  Problem # 1:  HYPERTENSION (ICD-401.9)  His updated medication list for this problem includes:    Norvasc 10 Mg Tabs (Amlodipine besylate) .Marland Kitchen... 1 by mouth once daily    Lisinopril-hydrochlorothiazide 20-12.5 Mg Tabs (Lisinopril-hydrochlorothiazide) .Marland Kitchen... 1po qd    Lisinopril 20 Mg Tabs (Lisinopril) .Marland Kitchen... Take 1 tablet by mouth once a day pt not seen today as above; shoudl f/u with MD assigned with [place] access program;  no charge  Complete Medication List: 1)  Norvasc 10 Mg Tabs (Amlodipine besylate) .Marland Kitchen.. 1 by mouth once daily 2)  Flomax 0.4 Mg Cp24 (Tamsulosin hcl) .Marland Kitchen.. 1 by mouth once daily 3)  Lisinopril-hydrochlorothiazide 20-12.5 Mg Tabs (Lisinopril-hydrochlorothiazide) .Marland Kitchen.. 1po qd 4)  Lisinopril 20 Mg Tabs (Lisinopril) .... Take 1 tablet by mouth once a day 5)  Robaxin-750 750 Mg Tabs (Methocarbamol) .... Take 1 tablet by mouth twice a day 6)  Viagra 100 Mg Tabs (Sildenafil citrate) .... Take 1 tablet by mouth once a day 7)  Cetirizine Hcl 10 Mg Tabs (Cetirizine hcl) .Marland Kitchen.. 1 by mouth once daily as needed 8)  Anusol-hc 25 Mg Supp (Hydrocortisone acetate) .Marland Kitchen.. 1 pr two times a day 9)  Nasacort Aq 55 Mcg/act Aers (Triamcinolone acetonide(nasal)) .... 2 spray/side once daily 10)  Simvastatin 40 Mg Tabs (Simvastatin) .Marland Kitchen.. 1 by mouth once daily 11)  Aciphex 20 Mg Tbec (Rabeprazole sodium) .Marland Kitchen.. 1 by mouth once daily 12)  Prednisone 10 Mg Tabs (Prednisone) .Marland Kitchen.. 1 by mouth once daily 13)  Darvocet-n 100 100-650 Mg Tabs (Propoxyphene n-apap) .Marland Kitchen.. 1 by mouth four times per day as needed pain 14)  Chlorpromazine Hcl 25 Mg Tabs (Chlorpromazine hcl) .Marland Kitchen.. 1 by mouth four times per day as needed hiccups  Other Orders: No Charge  Patient Arrived (NCPA0) (NCPA0)

## 2010-09-12 NOTE — Progress Notes (Signed)
  Phone Note Refill Request  on Dec 26, 2009 4:03 PM  Refills Requested: Medication #1:  LISINOPRIL-HYDROCHLOROTHIAZIDE 20-12.5 MG  TABS 1po once daily   Dosage confirmed as above?Dosage Confirmed   Notes: [facility] Initial call taken by: Sharon Seller,  Dec 26, 2009 4:03 PM    Prescriptions: LISINOPRIL-HYDROCHLOROTHIAZIDE 20-12.5 MG  TABS (LISINOPRIL-HYDROCHLOROTHIAZIDE) 1po once daily  #90 x 3   Entered by:   Sharon Seller   Authorized by:   Biagio Borg MD   Signed by:   Sharon Seller on 12/26/2009   Method used:   Faxed to ...       [facility] (retail)       [place]       [place], [place]  [id number]       Ph: [phone]       Fax: [phone]   RxID:   [id number]

## 2010-09-12 NOTE — Letter (Signed)
Summary: [facility]  [facility]   Imported By: Bubba Hales 06/16/2010 08:46:02  _____________________________________________________________________  External Attachment:    Type:   Image     Comment:   External Document

## 2010-09-12 NOTE — Letter (Signed)
Summary: EGD Instructions  [facility]  [place], [place] [address]   Phone: [phone]  Fax: [phone]       Troy Palmer    [birth year]    MRN: [id number]       Procedure Day /Date:FRIDAY 12/30/2009     Arrival Time: 2PM     Procedure Time:3PM     Location of Procedure:                    X  [facility] (4th Floor)  PREPARATION FOR ENDOSCOPY   On 12/30/2009  THE DAY OF THE PROCEDURE:  1.   No solid foods, milk or milk products are allowed after midnight the night before your procedure.  2.   Do not drink anything colored red or purple.  Avoid juices with pulp.  No orange juice.  3.  You may drink clear liquids until 1PM which is 2 hours before your procedure.                                                                                                CLEAR LIQUIDS INCLUDE: Water Jello Ice Popsicles Tea (sugar ok, no milk/cream) Powdered fruit flavored drinks Coffee (sugar ok, no milk/cream) Gatorade Juice: apple, white grape, white cranberry  Lemonade Clear bullion, consomm, broth Carbonated beverages (any kind) Strained chicken noodle soup Hard Candy   MEDICATION INSTRUCTIONS  Unless otherwise instructed, you should take regular prescription medications with a small sip of water as early as possible the morning of your procedure.            OTHER INSTRUCTIONS  You will need a responsible adult at least 65 years of age to accompany you and drive you home.   This person must remain in the waiting room during your procedure.  Wear loose fitting clothing that is easily removed.  Leave jewelry and other valuables at home.  However, you may wish to bring a book to read or an iPod/MP3 player to listen to music as you wait for your procedure to start.  Remove all body piercing jewelry and leave at home.  Total time from sign-in until discharge is approximately 2-3 hours.  You should go home directly after your  procedure and rest.  You can resume normal activities the day after your procedure.  The day of your procedure you should not:   Drive   Make legal decisions   Operate machinery   Drink alcohol   Return to work  You will receive specific instructions about eating, activities and medications before you leave.    The above instructions have been reviewed and explained to me by   _______________________    I fully understand and can verbalize these instructions _____________________________ Date _________

## 2010-09-12 NOTE — Progress Notes (Signed)
Summary: rx replacement  Phone Note Call from Patient Call back at Home Phone [phone]   Caller: Patient Summary of Call: pt called stating that he accidentally threw Rx for Flomax in the thrash. pt is requesting new rx. Initial call taken by: Crissie Sickles, [place],  October 04, 2009 11:20 AM    Prescriptions: NORVASC 10 MG  TABS (AMLODIPINE BESYLATE) 1 by mouth once daily  #90 x 3   Entered by:   Crissie Sickles, CMA   Authorized by:   Biagio Borg MD   Signed by:   Crissie Sickles, CMA on 10/04/2009   Method used:   Electronically to        [facility] (retail)       [address]       [place], [place]  [id number]       Ph: [phone]       Fax: [phone]   RxIDMD:[id number]

## 2010-09-12 NOTE — Miscellaneous (Signed)
  Clinical Lists Changes  Problems: Added new problem of CEREBROVASCULAR ACCIDENT, HX OF (ICD-V12.50)

## 2010-09-12 NOTE — Progress Notes (Signed)
Summary: Generic meds not working  Phone Note Call from Patient Call back at [facility] Phone [phone]   Summary of Call: Patient flomax and BP med has been changed to generic and now patient c/o generics not working as well. He c/o increased BP and swelling prostate. Please advise.  Initial call taken by: Ernestene Mention CMA,  February 27, 2010 2:27 PM  Follow-up for Phone Call        I can change to brand name, but does he realize [employer] essentially does not pay for any brand name meds?   For example, the BP med such as benicar HCT will likely be about 60-70 dollars, and change of generic flomax to brand name uroxatral will likely be similar  does he still want to change to brand name? Follow-up by: Biagio Borg MD,  February 27, 2010 5:13 PM  Additional Follow-up for Phone Call Additional follow up Details #1::        left message on machine for pt to return my call Crissie Sickles, CMA  February 28, 2010 8:31 AM   Pt states he was advised by pharmacy that mfr changed and this is the reason for the different medications. Pt advised of $ to change to brand name and declined, pt was also advised to contact pharmacy and find out if they can advise of a pharmacy that is still using previous mgfr. Additional Follow-up by: Crissie Sickles, [place],  February 28, 2010 9:44 AM

## 2010-09-12 NOTE — Miscellaneous (Signed)
Summary: Orders Update  Clinical Lists Changes  Orders: Added new Test order of Arterial Duplex Lower Extremity (Arterial Duplex Low) - Signed 

## 2010-09-12 NOTE — Progress Notes (Signed)
Summary: Triage  Phone Note Call from Patient Call back at Home Phone [phone]   Caller: Patient Call For: Dr. Deatra Ina Reason for Call: Talk to Nurse Summary of Call: pt. is constipated x3 wks, took stool softener and No BM Initial call taken by: Webb Laws,  April 25, 2010 8:25 AM  Follow-up for Phone Call          Pt. c/o worsening dysphagia, for 1 month. Stopped Omeprazole. Also has constipation, states he uses a stools softener daily, but stools are hard and difficult to pass.  Pt. did have a good BM this morning, but  having rectal pain from BM this morning. Denies n/v, abd. pain, blood, black stools, fever.   Pt. last seen 12-21-09, had an Endo. scheduled on 12-30-09, but made nurse remove his IV and he declined to have procedure done.    Pt. states he will callback to schedule an Endo/Dil., after he arranges transportation with his daughters.  1) Resume Omeprazole 40mg  daily. 2) Soft,bland diet. Be very careful with meats,rice and breads. 3) Miralax 17gm mixed in [place]. water or juice-daily.May use BID PRN. 4) Anusol HC supp. 1 pr qhs x 7-10 nights,sitz bath TID,Tucks pads to rectum TID,use baby wipes instead of toilet paper. 5) If symptoms become worse call back immediately or go to ER.   Follow-up by: Vivia Ewing LPN,  September 13, 624THL 10:27 AM  Additional Follow-up for Phone Call Additional follow up Details #1::        ok Additional Follow-up by: Inda Castle MD,  April 25, 2010 10:56 AM    New/Updated Medications: HYDROCORTISONE ACETATE 25 MG SUPP (HYDROCORTISONE ACETATE) Put 1 in rectum every night at bedtime for 7-10 nights, for hemorrhoids. MIRALAX   POWD (POLYETHYLENE GLYCOL 3350) Mix 17gm in a large glass of juice or water and drink 1 time daily. Prescriptions: MIRALAX   POWD (POLYETHYLENE GLYCOL 3350) Mix 17gm in a large glass of juice or water and drink 1 time daily.  #255gm x 2   Entered by:   Vivia Ewing LPN   Authorized by:   Inda Castle MD   Signed by:   Vivia Ewing LPN on QA348G   Method used:   Electronically to        [facility] (retail)       [place], [place]  [id number]       Ph: [phone]       Fax: [phone]   RxIDED:[id number] HYDROCORTISONE ACETATE 25 MG SUPP (HYDROCORTISONE ACETATE) Put 1 in rectum every night at bedtime for 7-10 nights, for hemorrhoids.  #10 x 1   Entered by:   Vivia Ewing LPN   Authorized by:   Inda Castle MD   Signed by:   Vivia Ewing LPN on QA348G   Method used:   Electronically to        [facility] (retail)       [place], [place]  [id number]       Ph: [phone]       Fax: [phone]   RxID:   [id number]

## 2010-09-12 NOTE — Progress Notes (Signed)
----   Converted from flag ---- ---- 12/26/2009 2:13 PM, Denice Paradise wrote: Pt has 1 bp pill left, Lisinopril, [facility] [address].  Troy Palmer [birth year] [phone]  thanks! cheryl ------------------------------  called pt and he stated he is out of BP medication. Patient states he has no refills on current bottle and is out. Refilled to [facility]

## 2010-09-12 NOTE — Consult Note (Signed)
Summary: [facility]  [facility]   Imported By: Rise Patience 04/18/2010 13:59:36  _____________________________________________________________________  External Attachment:    Type:   Image     Comment:   External Document

## 2010-09-12 NOTE — Assessment & Plan Note (Signed)
Summary: PAIN IN GROIN/PAIN WHEN URINATING-LB   Vital Signs:  Patient profile:   65 year old male Height:      70 inches Weight:      284 pounds BMI:     40.90 O2 Sat:      95 % on Room air Temp:     97.5 degrees F oral Pulse rate:   78 / minute BP sitting:   112 / 62  (left arm) Cuff size:   large  Vitals Entered By: Shirlean Mylar Ewing CMA Deborra Medina) (March 15, 2010 3:58 PM)  O2 Flow:  Room air CC: Groin pain worsened/RE   Primary Care Provider:  Cathlean Cower, MD  CC:  Groin pain worsened/RE.  History of Present Illness: here to f/u;  c/o increased lower abd pain more severe and diffuse than previous visit in the last 2 days; good compliance and tolerance of the cipro but still some tenderness to the testicles/scrotum it seems without change and some vague urinary discomfort but without freq or urgency;  also with lower abd tender so that palpation seems to make him want to have BM, but does not think himself constipated as he has had daily usual BM's for him and not sure that having BM makes pain better;  abd pain now 5-7/10, worse to bend forward, not assoc with n/v, high fever or chills, blood  or radiation to the back though may have been somewhat warm today.  Pt denies CP, sob, doe, wheezing, orthopnea, pnd, worsening LE edema, palps, dizziness or syncope  Pt denies new neuro symptoms such as headache, facial or extremity weakness  No fever, wt loss, night sweats, loss of appetite or other constitutional symptoms  recent urine culture neg, had mild renal insuff with cr 1.6 on  labs.   URI symptoms resolved  Problems Prior to Update: 1)  Abdominal Pain, Lower  (ICD-789.09) 2)  Uti  (ICD-599.0) 3)  Uri  (ICD-465.9) 4)  Pvd With Claudication  (ICD-443.89) 5)  Leg Pain, Bilateral  (ICD-729.5) 6)  Chest Pain  (ICD-786.50) 7)  Abdominal Pain, Epigastric  (ICD-789.06) 8)  Other Dysphagia  (ICD-787.29) 9)  Back Pain  (ICD-724.5) 10)  Preventive Health Care  (ICD-V70.0) 11)  Polymyalgia  Rheumatica  (ICD-725) 12)  Chest Pain  (ICD-786.50) 13)  Polyarthralgia  (ICD-719.49) 14)  Esophageal Stricture  (ICD-530.3) 15)  Chest Pain  (ICD-786.50) 16)  Hepatotoxicity, Drug-induced, Risk of  (ICD-V58.69) 17)  Dysphagia Unspecified  (ICD-787.20) 18)  Hemorrhoids, Recurrent  (ICD-455.6) 19)  Preventive Health Care  (ICD-V70.0) 20)  Diverticulosis, Colon  (ICD-562.10) 21)  Colonic Polyps, Hx of  (ICD-V12.72) 22)  Asthma  (ICD-493.90) 23)  Depression  (ICD-311) 24)  Gerd  (ICD-530.81) 25)  Hyperlipidemia  (ICD-272.4) 26)  Lung Nodule  (ICD-518.89) 27)  Sleep Apnea, Obstructive, Moderate  (ICD-327.23) 28)  Benign Prostatic Hypertrophy  (ICD-600.00) 29)  Erectile Dysfunction  (ICD-302.72) 30)  Low Back Pain  (ICD-724.2) 31)  Osteoarthritis  (ICD-715.90) 32)  Allergic Rhinitis  (ICD-477.9) 33)  Degeneration, Cervical Disc  (ICD-722.4) 34)  Disc Disease, Cervical  (ICD-722.4) 35)  Degenerative Joint Disease  (ICD-715.90) 36)  Tb Skin Test, Positive  (ICD-795.5) 37)  Obesity  (ICD-278.00) 38)  Hypertension  (ICD-401.9)  Medications Prior to Update: 1)  Tamsulosin Hcl 0.4 Mg Caps (Tamsulosin Hcl) .Marland Kitchen.. 1po Once Daily 2)  Lisinopril-Hydrochlorothiazide 20-12.5 Mg  Tabs (Lisinopril-Hydrochlorothiazide) .... 2po Once Daily 3)  Robaxin-750 750 Mg Tabs (Methocarbamol) .... Take 1 Tablet By Mouth Twice A  Day As Needed 4)  Nasacort Aq 55 Mcg/act  Aers (Triamcinolone Acetonide(Nasal)) .... 2 Spray/side Once Daily As Needed Allergies 5)  Omeprazole 20 Mg Cpdr (Omeprazole) .... 2po Once Daily 6)  Aspir-Low 81 Mg Tbec (Aspirin) .Marland Kitchen.. 1po Once Daily 7)  Ciprofloxacin Hcl 500 Mg Tabs (Ciprofloxacin Hcl) .Marland Kitchen.. 1 By Mouth Two Times A Day  Current Medications (verified): 1)  Tamsulosin Hcl 0.4 Mg Caps (Tamsulosin Hcl) .Marland Kitchen.. 1po Once Daily 2)  Lisinopril-Hydrochlorothiazide 20-12.5 Mg  Tabs (Lisinopril-Hydrochlorothiazide) .... 2po Once Daily 3)  Robaxin-750 750 Mg Tabs (Methocarbamol) .... Take 1  Tablet By Mouth Twice A Day As Needed 4)  Nasacort Aq 55 Mcg/act  Aers (Triamcinolone Acetonide(Nasal)) .... 2 Spray/side Once Daily As Needed Allergies 5)  Omeprazole 20 Mg Cpdr (Omeprazole) .... 2po Once Daily 6)  Aspir-Low 81 Mg Tbec (Aspirin) .Marland Kitchen.. 1po Once Daily 7)  Ciprofloxacin Hcl 500 Mg Tabs (Ciprofloxacin Hcl) .Marland Kitchen.. 1 By Mouth Two Times A Day  Allergies (verified): No Known Drug Allergies  Past History:  Past Surgical History: Last updated: 12/16/2009 Total knee replacement Rotator cuff repair - left  Social History: Last updated: 03/23/2008 Married seperated 6 children - 1 died with suicide, 1 with 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   Risk Factors: Exercise: no (03/23/2008)  Risk Factors: Smoking Status: quit (12/04/2007)  Past Medical History: Hypertension Allergic rhinitis Obesity Osteoarthritis Low back pain Hx of Positive PPD Erectile Dysfunction Benign prostatic hypertrophy Obstructive Sleep Apnea Hyperlipidemia c-spine disc disease GERD Depression Asthma Colonic polyps, hx of - adenomatous Diverticulosis, colon variable medical compliance PMR - probable DDD Renal insufficiency  Review of Systems       all otherwise negative per pt -    Physical Exam  General:  alert and overweight-appearing.  , uncomfortable but non toxic appearing Head:  normocephalic and atraumatic.   Eyes:  vision grossly intact, pupils equal, and pupils round.   Ears:  R ear normal and L ear normal.   Nose:  no external deformity and no nasal discharge.   Mouth:  no gingival abnormalities and pharynx pink and moist.   Neck:  supple and no masses.   Lungs:  normal respiratory effort and normal breath sounds.   Heart:  normal rate and regular rhythm.   Abdomen:  soft and normal bowel sounds.  with mild to mod tender more to lower abd,  possibly more to the mid and LLQ without guarding or rebound; overall nondistended, no masses, no hernis Genitalia:  mild bilat testicle tender without swelling or scrotal erythema Extremities:  no edema, no erythema    Impression & Recommendations:  Problem # 1:  ABDOMINAL PAIN, LOWER (ICD-789.09)  His updated medication list for this problem includes:    Robaxin-750 750 Mg Tabs (Methocarbamol) .Marland Kitchen... Take 1 tablet by mouth twice a day as needed    Aspir-low 81 Mg Tbec (Aspirin) .Marland Kitchen... 1po once daily ? constipation, but cant r/o abscess;  to cont the antibx, check labs, and for CT abd/pelvis  Orders: TLB-BMP (Basic Metabolic Panel-BMET) (99991111) TLB-CBC Platelet - w/Differential (85025-CBCD) TLB-Hepatic/Liver Function Pnl (80076-HEPATIC) TLB-Lipase (83690-LIPASE) TLB-Sedimentation Rate (ESR) (85652-ESR) Radiology Referral (Radiology)  Problem # 2:  URI (ICD-465.9)  His updated medication list for this problem includes:    Aspir-low 81 Mg Tbec (Aspirin) .Marland Kitchen... 1po once daily resolved, to follow  Problem # 3:  HYPERTENSION (ICD-401.9)  His updated  medication list for this problem includes:    Lisinopril-hydrochlorothiazide 20-12.5 Mg Tabs (Lisinopril-hydrochlorothiazide) .Marland Kitchen... 2po once daily  BP today: 112/62 Prior BP: 138/62 (03/10/2010)  Labs Reviewed: K+: 4.3 (11/21/2009) Creat: : 1.6 (11/21/2009)   Chol: 202 (09/16/2009)   HDL: 47.20 (09/16/2009)   LDL: 111 (03/23/2008)   TG: 55.0 (09/16/2009) stable overall by hx and exam, ok to continue meds/tx as is   Problem # 4:  RENAL INSUFFICIENCY (ICD-588.9) for f/u today, cont cipro for now given exam, recent urine cx neg however  Complete Medication List: 1)  Tamsulosin Hcl 0.4 Mg Caps (Tamsulosin hcl) .Marland Kitchen.. 1po once daily 2)  Lisinopril-hydrochlorothiazide 20-12.5 Mg Tabs (Lisinopril-hydrochlorothiazide) .... 2po once daily 3)  Robaxin-750 750 Mg Tabs (Methocarbamol) .... Take 1 tablet by mouth twice a day as needed 4)   Nasacort Aq 55 Mcg/act Aers (Triamcinolone acetonide(nasal)) .... 2 spray/side once daily as needed allergies 5)  Omeprazole 20 Mg Cpdr (Omeprazole) .... 2po once daily 6)  Aspir-low 81 Mg Tbec (Aspirin) .Marland Kitchen.. 1po once daily 7)  Ciprofloxacin Hcl 500 Mg Tabs (Ciprofloxacin hcl) .Marland Kitchen.. 1 by mouth two times a day  Patient Instructions: 1)  Continue all previous medications as before this visit  2)  You will be contacted about the referral(s) to: urology 3)  Please go to the Lab in the basement for your blood and/or urine tests today 4)  You will be contacted about the referral(s) to: CT scan 5)  Please schedule a follow-up appointment as needed.

## 2010-09-12 NOTE — Progress Notes (Signed)
Summary: Azithromycin  Phone Note Call from Patient Call back at Home Phone [phone]   Summary of Call: Patient left message on triage that the antibiotic he was given is not working--Azithromycin. Per the patient this has happened before and he needs at least 550mg  sent to his pharmacy. Please advise. Initial call taken by: Ernestene Mention,  September 19, 2009 3:55 PM  Follow-up for Phone Call        550 mg does not sound like the azithromycin, but can change to cephalexin 500 mg - done escript Follow-up by: Biagio Borg MD,  September 19, 2009 4:04 PM  Additional Follow-up for Phone Call Additional follow up Details #1::        Patient notified and he will call phone tree to hear previous results. Additional Follow-up by: Ernestene Mention,  September 19, 2009 4:29 PM    New/Updated Medications: CEPHALEXIN 500 MG CAPS (CEPHALEXIN) 1 by mouth three times a day Prescriptions: CEPHALEXIN 500 MG CAPS (CEPHALEXIN) 1 by mouth three times a day  #30 x 0   Entered and Authorized by:   Biagio Borg MD   Signed by:   Biagio Borg MD on 09/19/2009   Method used:   Electronically to        [facility])       [address]       [place], [place]  [id number]       Ph: [phone]       Fax: [phone]   RxIDCE:[id number]

## 2010-09-12 NOTE — Progress Notes (Signed)
Summary: Colonoscopy  Phone Note Call from Patient Call back at Home Phone [phone]   Caller: Patient Call For: Biagio Borg MD Summary of Call: Pt requests referral for colonoscopy, pt last seen 06/28/10. Initial call taken by: Denice Paradise,  July 04, 2010 11:12 AM  Follow-up for Phone Call        Pt advised that colonoscopy not due until 2013. Pt expresses understanding Follow-up by: Crissie Sickles, [place],  July 04, 2010 11:23 AM

## 2010-09-12 NOTE — Assessment & Plan Note (Signed)
Summary: dysphagia...em   History of Present Illness Visit Type: consult  Primary GI MD: Erskine Emery MD [facility] Primary Provider: Cathlean Cower, MD  Requesting Provider: Cathlean Cower, MD Chief Complaint: dysphagia, and chest pain History of Present Illness:   Troy Palmer has returned complaining of odynophagia.  He was last seen by GI in October 2009 for dysphagia.  An early esophageal stricture was dilated.  This has been accompanied by upper abdominal pain and, most recently, pain with swallowing.  His abdominal pain has subsided with omeprazole.  He continues to complain of pain with swallowing that occurs throughout his chest.  He denies dysphagia, per se.  He has not been on recent antibiotics.  The patient is also complaining of left-sided chest pain over the last few hours.  Pain is a little bit worse with exertion and he has pain in his left arm as well.  This began last evening.  He claims the pain is lessened when he sits up  Patient has a history of hypertension and sleep apnea.   GI Review of Systems    Reports chest pain and  dysphagia with solids.      Denies abdominal pain, acid reflux, belching, bloating, dysphagia with liquids, heartburn, loss of appetite, nausea, vomiting, vomiting blood, weight loss, and  weight gain.        Denies anal fissure, black tarry stools, change in bowel habit, constipation, diarrhea, diverticulosis, fecal incontinence, heme positive stool, hemorrhoids, irritable bowel syndrome, jaundice, light color stool, liver problems, rectal bleeding, and  rectal pain.    Current Medications (verified): 1)  Flomax 0.4 Mg  Cp24 (Tamsulosin Hcl) .Marland Kitchen.. 1 By Mouth Once Daily - Generic 2)  Lisinopril-Hydrochlorothiazide 20-12.5 Mg  Tabs (Lisinopril-Hydrochlorothiazide) .Marland Kitchen.. 1po Once Daily 3)  Robaxin-750 750 Mg Tabs (Methocarbamol) .... Take 1 Tablet By Mouth Twice A Day As Needed 4)  Nasacort Aq 55 Mcg/act  Aers (Triamcinolone Acetonide(Nasal)) .... 2 Spray/side  Once Daily As Needed Allergies 5)  Omeprazole 20 Mg Cpdr (Omeprazole) .... 2po Once Daily 6)  Aspir-Low 81 Mg Tbec (Aspirin) .Marland Kitchen.. 1po Once Daily  Allergies (verified): 1)  ! * Hctz  Past History:  Past Medical History: Reviewed history from 12/16/2009 and no changes required. Hypertension Allergic rhinitis Obesity Osteoarthritis Low back pain Hx of Positive PPD Erectile Dysfunction Benign prostatic hypertrophy Obstructive Sleep Apnea Hyperlipidemia c-spine disc disease GERD Depression Asthma Colonic polyps, hx of - adenomatous Diverticulosis, colon variable medical compliance PMR - probable DDD  Past Surgical History: Reviewed history from 12/16/2009 and no changes required. Total knee replacement Rotator cuff repair - left  Family History: asthma father with heart disease mother with brain tumor brother with lupus HTN No FH of Colon Cancer:  Social History: Reviewed history from 03/23/2008 and no changes required. 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   Review of Systems       The patient complains of arthritis/joint pain and shortness of breath.  The patient denies allergy/sinus, anemia, anxiety-new, back pain, blood in urine, breast changes/lumps, change in vision, confusion, cough, coughing up blood, depression-new, fainting, fatigue, fever, headaches-new, hearing problems, heart murmur, heart rhythm changes, itching, muscle pains/cramps, night sweats, nosebleeds, skin rash, sleeping problems, sore throat, swelling of feet/legs, swollen lymph glands, thirst - excessive, urination - excessive, urination changes/pain, urine leakage, vision changes, and  voice change.         All other systems were reviewed and were negative   Vital Signs:  Patient profile:   65 year old  male Height:      70 inches Weight:      290 pounds BMI:     41.76 BSA:     2.45 Pulse rate:   72 / minute Pulse rhythm:   regular BP sitting:   144 / 86  (left arm) Cuff size:   large  Physical Exam  Additional Exam:  He is a well-developed well-nourished male in no acute distress  skin: anicteric HEENT: normocephalic; PEERLA; no nasal or pharyngeal abnormalities neck: supple nodes: no cervical lymphadenopathy chest: clear to ausculatation and percussion heart: no murmurs, gallops, or rubs abd: soft, nontender; BS normoactive; no abdominal masses, tenderness, organomegaly rectal: deferred ext: no cynanosis, clubbing, edema skeletal: no deformities neuro: oriented x 3; no focal abnormalities    Impression & Recommendations:  Problem # 1:  DYSPHAGIA UNSPECIFIED (ICD-787.20) Patient's complaints of odynophagia raise the question of active inflammatory disease from acid reflux or possibly from candida esophagitis.  He has no risk factors for the latter.  A recurrent esophageal stricture is also a possibility.  Recommendations #1 continue omeprazole #2 upper endoscopy  Problem # 2:  CHEST PAIN (ICD-786.50) Symptoms could be related to PMR and chest wall pain.  Cardiac etiology is less likely though should he ruled out.  Recommendations #1 stat evaluation by primary care - the patient is going to be seen by primary care as soon as he leaves the GI office  Problem # 3:  COLONIC POLYPS, HX OF (ICD-V12.72) Plan followup colonoscopy in 2013  Other Orders: EGD (EGD)  Patient Instructions: 1)  Copy sent to : Troy Palmer 2)  Continue Omeprazole 3)  You will go directly to see Troy Palmer after your appointment with [place] today 4)  Your EGD is scheduled for 12/30/2009 at 3pm 5)  The medication list was reviewed and reconciled.  All changed / newly prescribed medications were explained.  A complete medication list was provided to the patient / caregiver.

## 2010-09-12 NOTE — Assessment & Plan Note (Signed)
Summary: discuss bp meds/#/cd   Vital Signs:  Patient profile:   65 year old male Height:      70 inches Weight:      290.50 pounds BMI:     41.83 O2 Sat:      97 % on Room air Temp:     97.8 degrees F oral Pulse rate:   78 / minute BP sitting:   138 / 62  (left arm) Cuff size:   large  Vitals Entered By: Shirlean Mylar Ewing CMA Deborra Medina) (March 10, 2010 2:46 PM)  O2 Flow:  Room air  CC: Discuss BP mediations, groin pain/RE   Primary Care Provider:  Cathlean Cower, MD  CC:  Discuss BP mediations and groin pain/RE.  History of Present Illness: here to f/u; has gotten away from taking his flomax b/c he thought he could do without it with some mild increased prostatism and 2 to 3 times per night nocturia;  also in the past 2 days with worsening dysuria and freq with some urgency and discomfort also in the testicle bilat (to which he calls the groin) with mild tender and ? swelling;  no worsening back pain/flank pain, n/v, chills or hematuria, though has has several headaches in the past wk.  Also of note is uncontrolled BP overall in the past few wks with freq SBP in the 150's per pt despite good BP med compliacne and tolerance.  Has been elev at home, as well as dentist.  Incidently has LE vascular consult next wk without change in LE complaints such as incresed pain or worsening claudication.  Also incidently today with 2 days URI symptoms with sinus congestion with clearish drainage and mild scratchy throat that is much improved today, no cough and Pt denies CP, sob, doe, wheezing, orthopnea, pnd, worsening LE edema, palps, dizziness or syncope  Pt also now states he is not allergic to HCTZ especially since he as been taking it with the Lis-HCT  Problems Prior to Update: 1)  Uti  (ICD-599.0) 2)  Uri  (ICD-465.9) 3)  Pvd With Claudication  (ICD-443.89) 4)  Leg Pain, Bilateral  (ICD-729.5) 5)  Chest Pain  (ICD-786.50) 6)  Abdominal Pain, Epigastric  (ICD-789.06) 7)  Other Dysphagia   (ICD-787.29) 8)  Back Pain  (ICD-724.5) 9)  Preventive Health Care  (ICD-V70.0) 10)  Polymyalgia Rheumatica  (ICD-725) 11)  Chest Pain  (ICD-786.50) 12)  Polyarthralgia  (ICD-719.49) 13)  Esophageal Stricture  (ICD-530.3) 14)  Chest Pain  (ICD-786.50) 15)  Hepatotoxicity, Drug-induced, Risk of  (ICD-V58.69) 16)  Dysphagia Unspecified  (ICD-787.20) 17)  Hemorrhoids, Recurrent  (ICD-455.6) 18)  Preventive Health Care  (ICD-V70.0) 19)  Diverticulosis, Colon  (ICD-562.10) 20)  Colonic Polyps, Hx of  (ICD-V12.72) 21)  Asthma  (ICD-493.90) 22)  Depression  (ICD-311) 23)  Gerd  (ICD-530.81) 24)  Hyperlipidemia  (ICD-272.4) 25)  Lung Nodule  (ICD-518.89) 26)  Sleep Apnea, Obstructive, Moderate  (ICD-327.23) 27)  Benign Prostatic Hypertrophy  (ICD-600.00) 28)  Erectile Dysfunction  (ICD-302.72) 29)  Low Back Pain  (ICD-724.2) 30)  Osteoarthritis  (ICD-715.90) 31)  Allergic Rhinitis  (ICD-477.9) 32)  Degeneration, Cervical Disc  (ICD-722.4) 33)  Disc Disease, Cervical  (ICD-722.4) 34)  Degenerative Joint Disease  (ICD-715.90) 35)  Tb Skin Test, Positive  (ICD-795.5) 36)  Obesity  (ICD-278.00) 37)  Hypertension  (ICD-401.9)  Medications Prior to Update: 1)  Flomax 0.4 Mg  Cp24 (Tamsulosin Hcl) .Marland Kitchen.. 1 By Mouth Once Daily - Generic 2)  Lisinopril-Hydrochlorothiazide 20-12.5 Mg  Tabs (Lisinopril-Hydrochlorothiazide) .Marland Kitchen.. 1po Once Daily 3)  Robaxin-750 750 Mg Tabs (Methocarbamol) .... Take 1 Tablet By Mouth Twice A Day As Needed 4)  Nasacort Aq 55 Mcg/act  Aers (Triamcinolone Acetonide(Nasal)) .... 2 Spray/side Once Daily As Needed Allergies 5)  Omeprazole 20 Mg Cpdr (Omeprazole) .... 2po Once Daily 6)  Aspir-Low 81 Mg Tbec (Aspirin) .Marland Kitchen.. 1po Once Daily 7)  Cephalexin 500 Mg Caps (Cephalexin) .Marland Kitchen.. 1 By Mouth Three Times A Day  Current Medications (verified): 1)  Tamsulosin Hcl 0.4 Mg Caps (Tamsulosin Hcl) .Marland Kitchen.. 1po Once Daily 2)  Lisinopril-Hydrochlorothiazide 20-12.5 Mg  Tabs  (Lisinopril-Hydrochlorothiazide) .... 2po Once Daily 3)  Robaxin-750 750 Mg Tabs (Methocarbamol) .... Take 1 Tablet By Mouth Twice A Day As Needed 4)  Nasacort Aq 55 Mcg/act  Aers (Triamcinolone Acetonide(Nasal)) .... 2 Spray/side Once Daily As Needed Allergies 5)  Omeprazole 20 Mg Cpdr (Omeprazole) .... 2po Once Daily 6)  Aspir-Low 81 Mg Tbec (Aspirin) .Marland Kitchen.. 1po Once Daily 7)  Ciprofloxacin Hcl 500 Mg Tabs (Ciprofloxacin Hcl) .Marland Kitchen.. 1 By Mouth Two Times A Day  Allergies (verified): No Known Drug Allergies  Past History:  Past Medical History: Last updated: 12/16/2009 Hypertension Allergic rhinitis Obesity Osteoarthritis Low back pain Hx of Positive PPD Erectile Dysfunction Benign prostatic hypertrophy Obstructive Sleep Apnea Hyperlipidemia c-spine disc disease GERD Depression Asthma Colonic polyps, hx of - adenomatous Diverticulosis, colon variable medical compliance PMR - probable DDD  Past Surgical History: Last updated: 12/16/2009 Total knee replacement Rotator cuff repair - left  Social History: Last updated: 03/23/2008 Married seperated 6 children - 1 died with suicide, 1 with 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   Risk Factors: Exercise: no (03/23/2008)  Risk Factors: Smoking Status: quit (12/04/2007)  Review of Systems       all otherwise negative per pt -    Physical Exam  General:  alert and overweight-appearing. , mild ill  Head:  normocephalic and atraumatic.   Eyes:  vision grossly intact, pupils equal, and pupils round.   Ears:  bilat tm's mild erythema, sinus nontender Nose:  nasal dischargemucosal pallor and mucosal edema.   Mouth:  pharyngeal erythema and fair dentition.   Neck:  supple and no masses.   Lungs:  normal respiratory effort and normal breath sounds.   Heart:  normal rate and regular rhythm.     Abdomen:  soft and normal bowel sounds.  with mild lower mid abd tender, somewhat more to the right inguinal area but no mass or hernia swelling Genitalia:  normal appearing except for bilat testicular tenderness and ? slight sweling Msk:  no flank tender bilat Extremities:  no edema, no erythema    Impression & Recommendations:  Problem # 1:  URI (ICD-465.9)  His updated medication list for this problem includes:    Aspir-low 81 Mg Tbec (Aspirin) .Marland Kitchen... 1po once daily c/w viral illness, for tylenol as needed, ok to follow  Problem # 2:  UTI (ICD-599.0)  His updated medication list for this problem includes:    Ciprofloxacin Hcl 500 Mg Tabs (Ciprofloxacin hcl) .Marland Kitchen... 1 by mouth two times a day  Orders: T-Culture, Urine [id number]) TLB-Udip w/ Micro (81001-URINE) probably - to check urine studies, treat as above, f/u any worsening signs or symptoms   Problem # 3:  HYPERTENSION (ICD-401.9)  His updated medication list for this problem includes:    Lisinopril-hydrochlorothiazide 20-12.5 Mg Tabs (  Lisinopril-hydrochlorothiazide) .Marland Kitchen... 2po once daily to incr to 2 per day for uncontrolled pressure per pt documention, though today is some better it seems than usual for him at home  BP today: 138/62 Prior BP: 140/80 (01/25/2010)  Labs Reviewed: K+: 4.3 (11/21/2009) Creat: : 1.6 (11/21/2009)   Chol: 202 (09/16/2009)   HDL: 47.20 (09/16/2009)   LDL: 111 (03/23/2008)   TG: 55.0 (09/16/2009)  Problem # 4:  BENIGN PROSTATIC HYPERTROPHY (ICD-600.00)  His updated medication list for this problem includes:    Tamsulosin Hcl 0.4 Mg Caps (Tamsulosin hcl) .Marland Kitchen... 1po once daily stable overall by hx and exam, ok to re-start meds/tx as is - to consider f/u urology, pt declines at this time  Complete Medication List: 1)  Tamsulosin Hcl 0.4 Mg Caps (Tamsulosin hcl) .Marland Kitchen.. 1po once daily 2)  Lisinopril-hydrochlorothiazide 20-12.5 Mg Tabs (Lisinopril-hydrochlorothiazide) .... 2po once daily 3)   Robaxin-750 750 Mg Tabs (Methocarbamol) .... Take 1 tablet by mouth twice a day as needed 4)  Nasacort Aq 55 Mcg/act Aers (Triamcinolone acetonide(nasal)) .... 2 spray/side once daily as needed allergies 5)  Omeprazole 20 Mg Cpdr (Omeprazole) .... 2po once daily 6)  Aspir-low 81 Mg Tbec (Aspirin) .Marland Kitchen.. 1po once daily 7)  Ciprofloxacin Hcl 500 Mg Tabs (Ciprofloxacin hcl) .Marland Kitchen.. 1 by mouth two times a day  Patient Instructions: 1)  Please take all new medications as prescribed - the antibiotic 2)  please re-start the flomax generic medication 3)  increase the lisiinoprilo HCT (Blood Pressure medicine) to TWO per day 4)  Continue all previous medications as before this visit  5)  Please go to the Lab in the basement for your urine tests today  6)  Please schedule a follow-up appointment in Feb 2012 with CPX labs ( or sooner if needed) 7)  Check your Blood Pressure regularly. If it is above 140/90: you should make an appointment sooner Prescriptions: LISINOPRIL-HYDROCHLOROTHIAZIDE 20-12.5 MG  TABS (LISINOPRIL-HYDROCHLOROTHIAZIDE) 2po once daily  #180 x 3   Entered and Authorized by:   Biagio Borg MD   Signed by:   Biagio Borg MD on 03/10/2010   Method used:   Print then Give to Patient   RxID:   [id number] TAMSULOSIN HCL 0.4 MG CAPS (TAMSULOSIN HCL) 1po once daily  #90 x 3   Entered and Authorized by:   Biagio Borg MD   Signed by:   Biagio Borg MD on 03/10/2010   Method used:   Print then Give to Patient   RxID:   [id number] CIPROFLOXACIN HCL 500 MG TABS (CIPROFLOXACIN HCL) 1 by mouth two times a day  #20 x 0   Entered and Authorized by:   Biagio Borg MD   Signed by:   Biagio Borg MD on 03/10/2010   Method used:   Print then Give to Patient   RxID:   [id number]

## 2010-09-12 NOTE — Assessment & Plan Note (Signed)
Summary: FOOT HURT/PN   Vital Signs:  Patient profile:   65 year old male Height:      70 inches Weight:      288 pounds BMI:     41.47 O2 Sat:      96 % on Room air Temp:     97 degrees F oral Pulse rate:   71 / minute BP sitting:   140 / 80  (left arm) Cuff size:   large  Vitals Entered ByShirlean Mylar Ewing (January 25, 2010 2:51 PM)  O2 Flow:  Room air  CC: foot and leg pain, headaches for 2 weeks/RE   Primary Care Provider:  Cathlean Cower, MD  CC:  foot and leg pain and headaches for 2 weeks/RE.  History of Present Illness: here with 3 days acute onset midl to mod fever, ST and prod cough but Pt denies CP, sob, doe, wheezing, orthopnea, pnd, worsening LE edema, palps, dizziness or syncope   Pt denies new neuro symptoms such as headache, facial or extremity weakness, except also has several days right lower back pain with mild radiation to the buttock;  no LE radicualr pain, weakness, numb. Does have bilat leg pain below the knees wtih ambulation, carmpy but several wks as well.  No ohter back pain, bowel or bladder change, wt loss, night sweats  Problems Prior to Update: 1)  Bronchitis-acute  (ICD-466.0) 2)  Leg Pain, Bilateral  (ICD-729.5) 3)  Chest Pain  (ICD-786.50) 4)  Abdominal Pain, Epigastric  (ICD-789.06) 5)  Other Dysphagia  (ICD-787.29) 6)  Back Pain  (ICD-724.5) 7)  Preventive Health Care  (ICD-V70.0) 8)  Polymyalgia Rheumatica  (ICD-725) 9)  Chest Pain  (ICD-786.50) 10)  Polyarthralgia  (ICD-719.49) 11)  Esophageal Stricture  (ICD-530.3) 12)  Chest Pain  (ICD-786.50) 13)  Hepatotoxicity, Drug-induced, Risk of  (ICD-V58.69) 14)  Dysphagia Unspecified  (ICD-787.20) 15)  Hemorrhoids, Recurrent  (ICD-455.6) 16)  Preventive Health Care  (ICD-V70.0) 17)  Diverticulosis, Colon  (ICD-562.10) 18)  Colonic Polyps, Hx of  (ICD-V12.72) 19)  Asthma  (ICD-493.90) 20)  Depression  (ICD-311) 21)  Gerd  (ICD-530.81) 22)  Hyperlipidemia  (ICD-272.4) 23)  Lung Nodule   (ICD-518.89) 24)  Sleep Apnea, Obstructive, Moderate  (ICD-327.23) 25)  Benign Prostatic Hypertrophy  (ICD-600.00) 26)  Erectile Dysfunction  (ICD-302.72) 27)  Low Back Pain  (ICD-724.2) 28)  Osteoarthritis  (ICD-715.90) 29)  Allergic Rhinitis  (ICD-477.9) 30)  Degeneration, Cervical Disc  (ICD-722.4) 31)  Disc Disease, Cervical  (ICD-722.4) 32)  Degenerative Joint Disease  (ICD-715.90) 33)  Tb Skin Test, Positive  (ICD-795.5) 34)  Obesity  (ICD-278.00) 35)  Hypertension  (ICD-401.9)  Medications Prior to Update: 1)  Flomax 0.4 Mg  Cp24 (Tamsulosin Hcl) .Marland Kitchen.. 1 By Mouth Once Daily - Generic 2)  Lisinopril-Hydrochlorothiazide 20-12.5 Mg  Tabs (Lisinopril-Hydrochlorothiazide) .Marland Kitchen.. 1po Once Daily 3)  Robaxin-750 750 Mg Tabs (Methocarbamol) .... Take 1 Tablet By Mouth Twice A Day As Needed 4)  Nasacort Aq 55 Mcg/act  Aers (Triamcinolone Acetonide(Nasal)) .... 2 Spray/side Once Daily As Needed Allergies 5)  Omeprazole 20 Mg Cpdr (Omeprazole) .... 2po Once Daily 6)  Aspir-Low 81 Mg Tbec (Aspirin) .Marland Kitchen.. 1po Once Daily  Current Medications (verified): 1)  Flomax 0.4 Mg  Cp24 (Tamsulosin Hcl) .Marland Kitchen.. 1 By Mouth Once Daily - Generic 2)  Lisinopril-Hydrochlorothiazide 20-12.5 Mg  Tabs (Lisinopril-Hydrochlorothiazide) .Marland Kitchen.. 1po Once Daily 3)  Robaxin-750 750 Mg Tabs (Methocarbamol) .... Take 1 Tablet By Mouth Twice A Day As Needed 4)  Nasacort Aq  55 Mcg/act  Aers (Triamcinolone Acetonide(Nasal)) .... 2 Spray/side Once Daily As Needed Allergies 5)  Omeprazole 20 Mg Cpdr (Omeprazole) .... 2po Once Daily 6)  Aspir-Low 81 Mg Tbec (Aspirin) .Marland Kitchen.. 1po Once Daily 7)  Cephalexin 500 Mg Caps (Cephalexin) .Marland Kitchen.. 1 By Mouth Three Times A Day  Allergies (verified): 1)  ! 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   Risk Factors: Exercise: no (03/23/2008)  Risk Factors: Smoking Status: quit (12/04/2007)  Review of Systems       all otherwise negative per pt -    Physical Exam  General:  alert and overweight-appearing.  , mild ill  Head:  normocephalic and atraumatic.   Eyes:  vision grossly intact, pupils equal, and pupils round.   Ears:  bilat tm's red, sinus nontender Nose:  nasal dischargemucosal pallor and mucosal edema.   Mouth:  pharyngeal erythema and fair dentition.   Neck:  supple and no masses.   Lungs:  normal respiratory effort and normal breath sounds.   Heart:  normal rate and regular rhythm.   Abdomen:  soft, non-tender, and normal bowel sounds.   Msk:  no joint tenderness and no joint swelling.  , on spine tender, has mild tender near right SI joint area Pulses:  1+ bilat dorsalis pedis Extremities:  no edema, no erythema  Neurologic:  cranial nerves II-XII intact, strength normal in all extremities, and sensation intact to light touch.     Impression & Recommendations:  Problem # 1:  BRONCHITIS-ACUTE (ICD-466.0)  His updated medication list for this problem includes:    Cephalexin 500 Mg Caps (Cephalexin) .Marland Kitchen... 1 by mouth three times a day treat as above, f/u any worsening signs or symptoms   Problem # 2:  LOW BACK PAIN (ICD-724.2)  His updated medication list for this problem includes:    Robaxin-750 750 Mg Tabs  (Methocarbamol) .Marland Kitchen... Take 1 tablet by mouth twice a day as needed    Aspir-low 81 Mg Tbec (Aspirin) .Marland Kitchen... 1po once daily  Orders: T-Culture, Urine [id number]) TLB-Udip w/ Micro (81001-URINE) treat as above, f/u any worsening signs or symptoms, check urine studies , c/w msk strain  Problem # 3:  LEG PAIN, BILATERAL (ICD-729.5)  and feet pain bilat - for LE art dopplers  Orders: Radiology Referral (Radiology)  Problem # 4:  HYPERTENSION (ICD-401.9)  His updated medication list for this problem includes:    Lisinopril-hydrochlorothiazide 20-12.5 Mg Tabs (Lisinopril-hydrochlorothiazide) .Marland Kitchen... 1po once daily  BP today: 140/80 Prior BP: 110/72 (12/21/2009)  Labs Reviewed: K+: 4.3 (11/21/2009) Creat: : 1.6 (11/21/2009)   Chol: 202 (09/16/2009)   HDL: 47.20 (09/16/2009)   LDL: 111 (03/23/2008)   TG: 55.0 (09/16/2009) stable overall by hx and exam, ok to continue meds/tx as is   Complete Medication List: 1)  Flomax 0.4 Mg Cp24 (Tamsulosin hcl) .Marland Kitchen.. 1 by mouth once daily - generic 2)  Lisinopril-hydrochlorothiazide 20-12.5 Mg Tabs (Lisinopril-hydrochlorothiazide) .Marland Kitchen.. 1po once daily 3)  Robaxin-750 750 Mg Tabs (Methocarbamol) .... Take 1 tablet by mouth twice a day as needed 4)  Nasacort Aq 55 Mcg/act Aers (Triamcinolone acetonide(nasal)) .... 2 spray/side once daily as needed allergies 5)  Omeprazole 20 Mg Cpdr (Omeprazole) .... 2po once daily 6)  Aspir-low 81 Mg Tbec (Aspirin) .Marland Kitchen.. 1po once daily 7)  Cephalexin 500 Mg Caps (Cephalexin) .Marland Kitchen.. 1 by mouth three times a day  Patient Instructions: 1)  Please take all new medications as prescribed 2)  Continue all previous medications as before this visit  3)  Please go to the Lab in the basement for your urine tests today  4)  You will be contacted about the referral(s) to: Leg arterial dopplers (ultrasound to check for PAD) 5)  Please schedule a follow-up appointment in feb 2012 with CPX labs , or sooner if  needed Prescriptions: CEPHALEXIN 500 MG CAPS (CEPHALEXIN) 1 by mouth three times a day  #30 x 0   Entered and Authorized by:   Biagio Borg MD   Signed by:   Biagio Borg MD on 01/25/2010   Method used:   Print then Give to Patient   RxID:   [id number]

## 2010-09-12 NOTE — Assessment & Plan Note (Signed)
Summary: CP per Dr Deatra Ina / SD   Vital Signs:  Patient profile:   65 year old male Height:      70 inches Weight:      290.75 pounds BMI:     41.87 O2 Sat:      95 % on Room air Temp:     97.3 degrees F oral Pulse rate:   67 / minute BP sitting:   110 / 72  (left arm) Cuff size:   regular  Vitals Entered By: Crissie Sickles, CMA (Dec 21, 2009 2:31 PM)  O2 Flow:  Room air  CC: CP per Dr. Deatra Ina since last pm, sharp, worse with exertion. DBD   Primary Care Provider:  Cathlean Cower, MD  CC:  CP per Dr. Deatra Ina since last pm, sharp, and worse with exertion. DBD.  History of Present Illness: here with c/o left and mid sternal CP, sharp, belching did not make better;  not sure about arm pain or radiation to the arm since "I have arthritis in my arm".  No other radiaiton such as neck or shoulder, Did have some sob with lying down last PM, better to sit up;  no n/v, diaphoresis, no palps, dizziness or syncope.  Has been intermittent since last PM, wore to bend over to tie his shoes this am and last night with turning over in bed;  exertion o/w does NOT make it worse; non pleuritic.   last stress test over 2 yrs ago.  No fever, cough, wheezing, doe, worsenng LE edema,  pnd.  No wt loss, night sweats, increased depressive symtpoms or anxiety.    Problems Prior to Update: 1)  Abdominal Pain, Epigastric  (ICD-789.06) 2)  Other Dysphagia  (ICD-787.29) 3)  Back Pain  (ICD-724.5) 4)  Preventive Health Care  (ICD-V70.0) 5)  Polymyalgia Rheumatica  (ICD-725) 6)  Chest Pain  (ICD-786.50) 7)  Polyarthralgia  (ICD-719.49) 8)  Esophageal Stricture  (ICD-530.3) 9)  Chest Pain  (ICD-786.50) 10)  Hepatotoxicity, Drug-induced, Risk of  (ICD-V58.69) 11)  Dysphagia Unspecified  (ICD-787.20) 12)  Hemorrhoids, Recurrent  (ICD-455.6) 13)  Preventive Health Care  (ICD-V70.0) 14)  Diverticulosis, Colon  (ICD-562.10) 15)  Colonic Polyps, Hx of  (ICD-V12.72) 16)  Asthma  (ICD-493.90) 17)  Depression   (ICD-311) 18)  Gerd  (ICD-530.81) 19)  Hyperlipidemia  (ICD-272.4) 20)  Lung Nodule  (ICD-518.89) 21)  Sleep Apnea, Obstructive, Moderate  (ICD-327.23) 22)  Benign Prostatic Hypertrophy  (ICD-600.00) 23)  Erectile Dysfunction  (ICD-302.72) 24)  Low Back Pain  (ICD-724.2) 25)  Osteoarthritis  (ICD-715.90) 26)  Allergic Rhinitis  (ICD-477.9) 27)  Degeneration, Cervical Disc  (ICD-722.4) 28)  Disc Disease, Cervical  (ICD-722.4) 29)  Degenerative Joint Disease  (ICD-715.90) 30)  Tb Skin Test, Positive  (ICD-795.5) 31)  Obesity  (ICD-278.00) 32)  Hypertension  (ICD-401.9)  Medications Prior to Update: 1)  Flomax 0.4 Mg  Cp24 (Tamsulosin Hcl) .Marland Kitchen.. 1 By Mouth Once Daily - Generic 2)  Lisinopril-Hydrochlorothiazide 20-12.5 Mg  Tabs (Lisinopril-Hydrochlorothiazide) .Marland Kitchen.. 1po Once Daily 3)  Robaxin-750 750 Mg Tabs (Methocarbamol) .... Take 1 Tablet By Mouth Twice A Day As Needed 4)  Nasacort Aq 55 Mcg/act  Aers (Triamcinolone Acetonide(Nasal)) .... 2 Spray/side Once Daily As Needed Allergies 5)  Omeprazole 20 Mg Cpdr (Omeprazole) .... 2po Once Daily 6)  Aspir-Low 81 Mg Tbec (Aspirin) .Marland Kitchen.. 1po Once Daily  Current Medications (verified): 1)  Flomax 0.4 Mg  Cp24 (Tamsulosin Hcl) .Marland Kitchen.. 1 By Mouth Once Daily - Generic 2)  Lisinopril-Hydrochlorothiazide  20-12.5 Mg  Tabs (Lisinopril-Hydrochlorothiazide) .Marland Kitchen.. 1po Once Daily 3)  Robaxin-750 750 Mg Tabs (Methocarbamol) .... Take 1 Tablet By Mouth Twice A Day As Needed 4)  Nasacort Aq 55 Mcg/act  Aers (Triamcinolone Acetonide(Nasal)) .... 2 Spray/side Once Daily As Needed Allergies 5)  Omeprazole 20 Mg Cpdr (Omeprazole) .... 2po Once Daily 6)  Aspir-Low 81 Mg Tbec (Aspirin) .Marland Kitchen.. 1po Once Daily  Allergies (verified): 1)  ! 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   Risk Factors: Exercise: no (03/23/2008)  Risk Factors: Smoking Status: quit (12/04/2007)  Family History: Reviewed history from 12/04/2007 and no changes required. asthma father with heart disease mother with brain tumor brother with lupus HTN  Review of Systems  The patient denies anorexia, fever, weight loss, weight gain, hoarseness, hemoptysis, abdominal pain, melena, hematochezia, severe indigestion/heartburn, and unusual weight change.         all otherwise negative per pt -    Physical Exam  General:  alert and overweight-appearing.   Head:  normocephalic and atraumatic.   Eyes:  vision grossly intact, pupils equal, and pupils round.   Ears:  R ear normal and L ear normal.   Nose:  no external deformity and no nasal discharge.   Mouth:  no gingival abnormalities and pharynx pink and moist.   Neck:  supple and no masses.   Lungs:  normal respiratory effort and normal breath sounds.   Heart:  normal rate and regular rhythm.   Msk:  tender left upper sternal border, reproduces pain per pt Extremities:  no edema, no erythema    Impression & Recommendations:  Problem # 1:  CHEST PAIN (ICD-786.50) ecg reviewed - no change;  pain c/w MSK etiology; for tylenol as needed, reassured , to check cxr, but I do not feel needs f/u stress test at this time and pt agrees Orders: EKG w/ Interpretation (93000) T-2 View CXR, Same  Day (71020.5TC)  Problem # 2:  HYPERTENSION (ICD-401.9)  His updated medication list for this problem includes:    Lisinopril-hydrochlorothiazide 20-12.5 Mg Tabs (Lisinopril-hydrochlorothiazide) .Marland Kitchen... 1po once daily  BP today: 110/72 Prior BP: 128/66 (11/21/2009)  Labs Reviewed: K+: 4.3 (11/21/2009) Creat: : 1.6 (11/21/2009)   Chol: 202 (09/16/2009)   HDL: 47.20 (09/16/2009)   LDL: 111 (03/23/2008)   TG: 55.0 (09/16/2009) stable overall by hx and exam, ok to continue meds/tx as is   Problem # 3:  ASTHMA (ICD-493.90) stable, exam benign, ok to follow for now  Complete Medication List: 1)  Flomax 0.4 Mg Cp24 (Tamsulosin hcl) .Marland Kitchen.. 1 by mouth once daily - generic 2)  Lisinopril-hydrochlorothiazide 20-12.5 Mg Tabs (Lisinopril-hydrochlorothiazide) .Marland Kitchen.. 1po once daily 3)  Robaxin-750 750 Mg Tabs (Methocarbamol) .... Take 1 tablet by mouth twice a day as needed 4)  Nasacort Aq 55 Mcg/act Aers (Triamcinolone acetonide(nasal)) .... 2 spray/side once daily as needed allergies 5)  Omeprazole 20 Mg  Cpdr (Omeprazole) .... 2po once daily 6)  Aspir-low 81 Mg Tbec (Aspirin) .Marland Kitchen.. 1po once daily  Patient Instructions: 1)  Please go to Radiology in the basement level for your X-Ray today  2)  Your EKG was good today 3)  OK to take tylenol as needed for the pain 4)  Continue all previous medications as before this visit  5)  Please schedule a follow-up appointment as needed.

## 2010-09-12 NOTE — Miscellaneous (Signed)
Summary: Procedure Cancellation  Clinical Lists Changes     Mr. Trivino came for his procedure, IV was started, pt was ready for procedure but after waiting for a period of time he decided that he could not go through with the procedure today.  Dr. Deatra Ina spoke with patient, patient wanted to cancel the procedure.  He was discharged home with his grand-daughter.  Dr. Deatra Ina stated that patient should be charged for the time.  Patient was billed on 01/10/2010.

## 2010-09-13 ENCOUNTER — Ambulatory Visit: Admit: 2010-09-13 | Payer: Self-pay | Admitting: Internal Medicine

## 2010-09-13 ENCOUNTER — Encounter: Payer: Self-pay | Admitting: Internal Medicine

## 2010-09-14 NOTE — Assessment & Plan Note (Signed)
Summary: stopped up nose/#/cd   Vital Signs:  Patient profile:   65 year old [place] Height:      70 inches Weight:      281.38 pounds BMI:     40.52 O2 Sat:      95 % on Room air Temp:     97.4 degrees F oral Pulse rate:   77 / minute BP sitting:   132 / 80  (left arm) Cuff size:   large  Vitals Entered By: Shirlean Mylar Ewing CMA Deborra Medina) (August 23, 2010 2:41 PM)  O2 Flow:  Room air  CC: Nasal Congestion/RE   Primary Care Provider:  Cathlean Cower, MD  CC:  Nasal Congestion/RE.  History of Present Illness: here for acute f/u - c/o 3 days acute onset facial pain, pressure, fever and greenish d/c, with mild sT, no cough and Pt denies CP, worsening sob, doe, wheezing, orthopnea, pnd, worsening LE edema, palps, dizziness or syncope  Pt denies new neuro symptoms such as headache, facial or extremity weakness  Pt denies polydipsia, polyuria, or low sugar symptoms such as shakiness improved with eating.  Overall good compliance with meds, trying to follow low chol diet, wt stable, little excercise however Has ongoing allergy symptoms but well controlled recetnly as long as he takes his allergy meds.  Denies worsening depressive symptoms, suicidal ideation, or panic.   No recent wt loss, night sweats, loss of appetite or other constitutional symptoms   Problems Prior to Update: 1)  Sinusitis- Acute-nos  (ICD-461.9) 2)  Cerebrovascular Accident, Hx of  (ICD-V12.50) 3)  Memory Loss  (ICD-780.93) 4)  Headache  (ICD-784.0) 5)  Neck Pain  (ICD-723.1) 6)  Renal Insufficiency  (ICD-588.9) 7)  Abdominal Pain, Lower  (ICD-789.09) 8)  Uti  (ICD-599.0) 9)  Uri  (ICD-465.9) 10)  Pvd With Claudication  (ICD-443.89) 11)  Leg Pain, Bilateral  (ICD-729.5) 12)  Chest Pain  (ICD-786.50) 13)  Abdominal Pain, Epigastric  (ICD-789.06) 14)  Other Dysphagia  (ICD-787.29) 15)  Back Pain  (ICD-724.5) 16)  Preventive Health Care  (ICD-V70.0) 17)  Polymyalgia Rheumatica  (ICD-725) 18)  Chest Pain  (ICD-786.50) 19)   Polyarthralgia  (ICD-719.49) 20)  Esophageal Stricture  (ICD-530.3) 21)  Chest Pain  (ICD-786.50) 22)  Hepatotoxicity, Drug-induced, Risk of  (ICD-V58.69) 23)  Dysphagia Unspecified  (ICD-787.20) 24)  Hemorrhoids, Recurrent  (ICD-455.6) 25)  Preventive Health Care  (ICD-V70.0) 26)  Diverticulosis, Colon  (ICD-562.10) 27)  Colonic Polyps, Hx of  (ICD-V12.72) 28)  Asthma  (ICD-493.90) 29)  Depression  (ICD-311) 30)  Gerd  (ICD-530.81) 31)  Hyperlipidemia  (ICD-272.4) 32)  Lung Nodule  (ICD-518.89) 33)  Sleep Apnea, Obstructive, Moderate  (ICD-327.23) 34)  Benign Prostatic Hypertrophy  (ICD-600.00) 35)  Erectile Dysfunction  (ICD-302.72) 36)  Low Back Pain  (ICD-724.2) 37)  Osteoarthritis  (ICD-715.90) 38)  Allergic Rhinitis  (ICD-477.9) 39)  Degeneration, Cervical Disc  (ICD-722.4) 40)  Disc Disease, Cervical  (ICD-722.4) 41)  Degenerative Joint Disease  (ICD-715.90) 42)  Tb Skin Test, Positive  (ICD-795.5) 43)  Obesity  (ICD-278.00) 44)  Hypertension  (ICD-401.9)  Medications Prior to Update: 1)  Rapaflo 8 Mg Caps (Silodosin) .Marland Kitchen.. 1po Once Daily 2)  Amlodipine Besylate 5 Mg Tabs (Amlodipine Besylate) .Marland Kitchen.. 1po Once Daily 3)  Flexeril 5 Mg Tabs (Cyclobenzaprine Hcl) .Marland Kitchen.. 1 By Mouth Three Times A Day As Needed 4)  Nasacort Aq 55 Mcg/act  Aers (Triamcinolone Acetonide(Nasal)) .... 2 Spray/side Once Daily As Needed Allergies 5)  Omeprazole 20 Mg Cpdr (  Omeprazole) .... 2po Once Daily 6)  Aspir-Low 81 Mg Tbec (Aspirin) .Marland Kitchen.. 1po Once Daily 7)  Hydrochlorothiazide 12.5 Mg Caps (Hydrochlorothiazide) .Marland Kitchen.. 1po Once Daily 8)  Fexofenadine Hcl 180 Mg Tabs (Fexofenadine Hcl) .Marland Kitchen.. 1 By Mouth Once Daily As Needed Allergies 9)  Oxycodone Hcl 5 Mg Tabs (Oxycodone Hcl) .Marland Kitchen.. 1-2 By Mouth Q 6 Hrs As Needed 10)  Zyrtec Allergy 10 Mg Tabs (Cetirizine Hcl) .Marland Kitchen.. 1 By Mouth Qd 11)  Sertraline Hcl 100 Mg Tabs (Sertraline Hcl) .Marland Kitchen.. 1 By Mouth Once Daily 12)  Diazepam 5 Mg Tabs (Diazepam) .Marland Kitchen.. 1 By Mouth X 1   - To Take 30 Min Prior To Procedure Dec 2  Current Medications (verified): 1)  Rapaflo 8 Mg Caps (Silodosin) .Marland Kitchen.. 1po Once Daily 2)  Amlodipine Besylate 5 Mg Tabs (Amlodipine Besylate) .Marland Kitchen.. 1po Once Daily 3)  Flexeril 5 Mg Tabs (Cyclobenzaprine Hcl) .Marland Kitchen.. 1 By Mouth Three Times A Day As Needed 4)  Nasacort Aq 55 Mcg/act  Aers (Triamcinolone Acetonide(Nasal)) .... 2 Spray/side Once Daily As Needed Allergies 5)  Omeprazole 20 Mg Cpdr (Omeprazole) .... 2po Once Daily 6)  Aspir-Low 81 Mg Tbec (Aspirin) .Marland Kitchen.. 1po Once Daily 7)  Hydrochlorothiazide 12.5 Mg Caps (Hydrochlorothiazide) .Marland Kitchen.. 1po Once Daily 8)  Fexofenadine Hcl 180 Mg Tabs (Fexofenadine Hcl) .Marland Kitchen.. 1 By Mouth Once Daily As Needed Allergies 9)  Oxycodone Hcl 5 Mg Tabs (Oxycodone Hcl) .Marland Kitchen.. 1-2 By Mouth Q 6 Hrs As Needed 10)  Zyrtec Allergy 10 Mg Tabs (Cetirizine Hcl) .Marland Kitchen.. 1 By Mouth Qd 11)  Sertraline Hcl 100 Mg Tabs (Sertraline Hcl) .Marland Kitchen.. 1 By Mouth Once Daily 12)  Diazepam 5 Mg Tabs (Diazepam) .Marland Kitchen.. 1 By Mouth X 1  - To Take 30 Min Prior To Procedure Dec 2 13)  Augmentin 875-125 Mg Tabs (Amoxicillin-Pot Clavulanate) .Marland Kitchen.. 1 By Mouth Two Times A Day - Generic  Allergies (verified): No Known Drug Allergies  Past History:  Past Medical History: Last updated: 03/15/2010 Hypertension Allergic rhinitis Obesity Osteoarthritis Low back pain Hx of Positive PPD Erectile Dysfunction Benign prostatic hypertrophy Obstructive Sleep Apnea Hyperlipidemia c-spine disc disease GERD Depression Asthma Colonic polyps, hx of - adenomatous Diverticulosis, colon variable medical compliance PMR - probable DDD Renal insufficiency  Past Surgical History: Last updated: 12/16/2009 Total knee replacement Rotator cuff repair - left  Social History: Last updated: 03/23/2008 Married seperated 6 children - 1 died with suicide, 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   Risk Factors: Exercise: no (03/23/2008)  Risk Factors: Smoking Status: quit (12/04/2007)  Review of Systems       all otherwise negative per pt -    Physical Exam  General:  alert and overweight-appearing.   Head:  normocephalic and atraumatic.   Eyes:  vision grossly intact, pupils equal, and pupils round.   Ears:  R ear normal and L ear normal.  , but sinus tender bilat Nose:  no external deformity, nasal dischargemucosal pallor, and mucosal edema.   Mouth:  pharyngeal erythema and fair dentition.   Neck:  supple and no masses.   Lungs:  normal respiratory effort and normal breath sounds.   Heart:  normal rate and regular rhythm.   Extremities:  no edema, no erythema    Impression & Recommendations:  Problem # 1:  SINUSITIS- ACUTE-NOS (ICD-461.9)  His updated medication list for this problem includes:  Nasacort Aq 55 Mcg/act Aers (Triamcinolone acetonide(nasal)) .Marland Kitchen... 2 spray/side once daily as needed allergies    Augmentin 875-125 Mg Tabs (Amoxicillin-pot clavulanate) .Marland Kitchen... 1 by mouth two times a day - generic treat as above, f/u any worsening signs or symptoms   Instructed on treatment. Call if symptoms persist or worsen.   Problem # 2:  ALLERGIC RHINITIS (ICD-477.9)  His updated medication list for this problem includes:    Nasacort Aq 55 Mcg/act Aers (Triamcinolone acetonide(nasal)) .Marland Kitchen... 2 spray/side once daily as needed allergies    Fexofenadine Hcl 180 Mg Tabs (Fexofenadine hcl) .Marland Kitchen... 1 by mouth once daily as needed allergies    Zyrtec Allergy 10 Mg Tabs (Cetirizine hcl) .Marland Kitchen... 1 by mouth qd treat as above, f/u any worsening signs or symptoms  - stable overall by hx and exam, ok to continue meds/tx as is   Discussed use of allergy medications and environmental measures.   Problem # 3:  HYPERTENSION (ICD-401.9)  His updated medication list for this problem includes:    Amlodipine  Besylate 5 Mg Tabs (Amlodipine besylate) .Marland Kitchen... 1po once daily    Hydrochlorothiazide 12.5 Mg Caps (Hydrochlorothiazide) .Marland Kitchen... 1po once daily  BP today: 132/80 Prior BP: 132/72 (06/28/2010)  Labs Reviewed: K+: 4.6 (03/20/2010) Creat: : 1.8 (03/20/2010)   Chol: 202 (09/16/2009)   HDL: [place].20 (09/16/2009)   LDL: 111 (03/23/2008)   TG: 55.0 (09/16/2009) stable overall by hx and exam, ok to continue meds/tx as is   Complete Medication List: 1)  Rapaflo 8 Mg Caps (Silodosin) .Marland Kitchen.. 1po once daily 2)  Amlodipine Besylate 5 Mg Tabs (Amlodipine besylate) .Marland Kitchen.. 1po once daily 3)  Flexeril 5 Mg Tabs (Cyclobenzaprine hcl) .Marland Kitchen.. 1 by mouth three times a day as needed 4)  Nasacort Aq 55 Mcg/act Aers (Triamcinolone acetonide(nasal)) .... 2 spray/side once daily as needed allergies 5)  Omeprazole 20 Mg Cpdr (Omeprazole) .... 2po once daily 6)  Aspir-low 81 Mg Tbec (Aspirin) .Marland Kitchen.. 1po once daily 7)  Hydrochlorothiazide 12.5 Mg Caps (Hydrochlorothiazide) .Marland Kitchen.. 1po once daily 8)  Fexofenadine Hcl 180 Mg Tabs (Fexofenadine hcl) .Marland Kitchen.. 1 by mouth once daily as needed allergies 9)  Oxycodone Hcl 5 Mg Tabs (Oxycodone hcl) .Marland Kitchen.. 1-2 by mouth q 6 hrs as needed 10)  Zyrtec Allergy 10 Mg Tabs (Cetirizine hcl) .Marland Kitchen.. 1 by mouth qd 11)  Sertraline Hcl 100 Mg Tabs (Sertraline hcl) .Marland Kitchen.. 1 by mouth once daily 12)  Diazepam 5 Mg Tabs (Diazepam) .Marland Kitchen.. 1 by mouth x 1  - to take 30 min prior to procedure dec 2 13)  Augmentin 875-125 Mg Tabs (Amoxicillin-pot clavulanate) .Marland Kitchen.. 1 by mouth two times a day - generic  Patient Instructions: 1)  Please take all new medications as prescribed - the antibiotic was [facility] to [facility] on [address] 2)  Continue all previous medications as before this visit  3)  Please schedule a follow-up appointment in 1 month for CPX with labs  Prescriptions: AUGMENTIN 875-125 MG TABS (AMOXICILLIN-POT CLAVULANATE) 1 by mouth two times a day - generic  #20 x 0   Entered and Authorized by:   Biagio Borg MD    Signed by:   Biagio Borg MD on 08/23/2010   Method used:   Electronically to        [facility] (retail)       [address]       [place], [place]  [id number]       Ph: [phone]  Fax: [phone]   RxIDIW:[id number]    Orders Added: 1)  Est. Patient Level IV [id number]

## 2010-09-14 NOTE — Progress Notes (Signed)
Summary: Rx req  Phone Note Call from Patient Call back at Home Phone [phone]   Caller: Patient Summary of Call: Pt called requesting Rx for stool softener or laxative be Rx'd to [facility] Initial call taken by: Crissie Sickles, [place],  September 08, 2010 11:00 AM  Follow-up for Phone Call        ok for colace 100 mg two times a day as needed - to robin to handle Follow-up by: Biagio Borg MD,  September 08, 2010 12:03 PM  Additional Follow-up for Phone Call Additional follow up Details #1::        called pt. informed sent prescription requested to pharmacy. Additional Follow-up by: Robin Ewing CMA (AAMA),  September 08, 2010 1:15 PM    New/Updated Medications: COLACE 100 MG CAPS (DOCUSATE SODIUM) 1 by mouth two times a day Prescriptions: COLACE 100 MG CAPS (DOCUSATE SODIUM) 1 by mouth two times a day  #60 x 2   Entered by:   Sharon Seller CMA ([place])   Authorized by:   Biagio Borg MD   Signed by:   Sharon Seller CMA ([place]) on 09/08/2010   Method used:   Faxed to ...       [facility]  [place] [phone]* (retail)       [address]       [place], [place]  [address]       Ph: [phone]       Fax: [phone]   RxID:   [id number]

## 2010-09-25 ENCOUNTER — Encounter: Payer: Self-pay | Admitting: Internal Medicine

## 2010-09-25 ENCOUNTER — Ambulatory Visit (INDEPENDENT_AMBULATORY_CARE_PROVIDER_SITE_OTHER): Payer: PRIVATE HEALTH INSURANCE | Admitting: Internal Medicine

## 2010-09-25 DIAGNOSIS — K5900 Constipation, unspecified: Secondary | ICD-10-CM

## 2010-09-25 HISTORY — DX: Constipation, unspecified: K59.00

## 2010-09-28 ENCOUNTER — Emergency Department (HOSPITAL_COMMUNITY): Payer: PRIVATE HEALTH INSURANCE

## 2010-09-28 ENCOUNTER — Emergency Department (HOSPITAL_COMMUNITY)
Admission: EM | Admit: 2010-09-28 | Discharge: 2010-09-28 | Disposition: A | Discharge status: Home / Self Care | Payer: PRIVATE HEALTH INSURANCE | Attending: Emergency Medicine | Admitting: Emergency Medicine

## 2010-09-28 DIAGNOSIS — IMO0002 Reserved for concepts with insufficient information to code with codable children: Secondary | ICD-10-CM | POA: Insufficient documentation

## 2010-09-28 DIAGNOSIS — I10 Essential (primary) hypertension: Secondary | ICD-10-CM | POA: Insufficient documentation

## 2010-09-28 DIAGNOSIS — K5900 Constipation, unspecified: Secondary | ICD-10-CM | POA: Insufficient documentation

## 2010-09-28 DIAGNOSIS — M549 Dorsalgia, unspecified: Secondary | ICD-10-CM | POA: Insufficient documentation

## 2010-09-28 LAB — URINALYSIS, ROUTINE W REFLEX MICROSCOPIC
Hgb urine dipstick: NEGATIVE
Specific Gravity, Urine: 1.013 (ref 1.005–1.030)
Urobilinogen, UA: 0.2 mg/dL (ref 0.0–1.0)
pH: 6.5 (ref 5.0–8.0)

## 2010-09-28 LAB — POCT I-STAT, CHEM 8
BUN: 15 mg/dL (ref 6–23)
Chloride: 103 mEq/L (ref 96–112)
Creatinine, Ser: 1.5 mg/dL (ref 0.4–1.5)
Hemoglobin: 14.3 g/dL (ref 13.0–17.0)
Potassium: 4 mEq/L (ref 3.5–5.1)
Sodium: 141 mEq/L (ref 135–145)

## 2010-10-04 NOTE — Assessment & Plan Note (Signed)
Summary: abdominal pain,no bowel movement in 2 weeks,Troy Palmer   Vital Signs:  Patient profile:   65 year old male Height:      70 inches (177.80 cm) O2 Sat:      92 % on Room air Temp:     98.0 degrees F (36.67 degrees C) oral Pulse rate:   85 / minute BP sitting:   128 / 82  (left arm) Cuff size:   large  Vitals Entered By: Tomma Lightning RMA (September 25, 2010 2:52 PM)  O2 Flow:  Room air CC: abdominal pain, Abdominal pain Is Patient Diabetic? No Pain Assessment Patient in pain? yes     Location: abdomen Type: aching Comments Pt states have'nt had a BM in 2 weeks   Primary Care Provider:  Cathlean Cower, MD  CC:  abdominal pain and Abdominal pain.  History of Present Illness:  Abdominal Pain      This is a 65 year old man who presents with Abdominal pain.  The symptoms began 2 weeks ago.  On a scale of 1 to 10, the intensity is described as a 5.  The patient reports nausea and constipation, but denies vomiting, diarrhea, melena, hematochezia, anorexia, and hematemesis.  The location of the pain is diffuse.  The pain is described as constant and dull.  The patient denies the following symptoms: fever, weight loss, dysuria, chest pain, jaundice, and dark urine.  The pain is worse with food.  The pain is better with antacids.    Clinical Review Panels:  Lipid Management   Cholesterol:  202 (09/16/2009)   LDL (bad choesterol):  111 (03/23/2008)   HDL (good cholesterol):  47.20 (09/16/2009)  CBC   WBC:  5.7 (03/15/2010)   RBC:  4.92 (03/15/2010)   Hgb:  13.9 (03/15/2010)   Hct:  41.8 (03/15/2010)   Platelets:  229.0 (03/15/2010)   MCV  85.0 (03/15/2010)   MCHC  33.2 (03/15/2010)   RDW  12.9 (03/15/2010)   PMN:  47.4 (03/15/2010)   Lymphs:  33.5 (03/15/2010)   Monos:  16.9 (03/15/2010)   Eosinophils:  1.9 (03/15/2010)   Basophil:  0.3 (03/15/2010)  Complete Metabolic Panel   Glucose:  145 (03/20/2010)   Sodium:  141 (03/20/2010)   Potassium:  4.6 (03/20/2010)  Chloride:  107 (03/20/2010)   CO2:  28 (03/20/2010)   BUN:  21 (03/20/2010)   Creatinine:  1.8 (03/20/2010)   Albumin:  3.6 (03/15/2010)   Total Protein:  7.0 (03/15/2010)   Calcium:  8.9 (03/20/2010)   Total Bili:  0.4 (03/15/2010)   Alk Phos:  86 (03/15/2010)   SGPT (ALT):  16 (03/15/2010)   SGOT (AST):  19 (03/15/2010)   Current Medications (verified): 1)  Rapaflo 8 Mg Caps (Silodosin) .Marland Kitchen.. 1po Once Daily 2)  Amlodipine Besylate 5 Mg Tabs (Amlodipine Besylate) .Marland Kitchen.. 1po Once Daily 3)  Flexeril 5 Mg Tabs (Cyclobenzaprine Hcl) .Marland Kitchen.. 1 By Mouth Three Times A Day As Needed 4)  Nasacort Aq 55 Mcg/act  Aers (Triamcinolone Acetonide(Nasal)) .... 2 Spray/side Once Daily As Needed Allergies 5)  Omeprazole 20 Mg Cpdr (Omeprazole) .... 2po Once Daily 6)  Aspir-Low 81 Mg Tbec (Aspirin) .Marland Kitchen.. 1po Once Daily 7)  Hydrochlorothiazide 12.5 Mg Caps (Hydrochlorothiazide) .Marland Kitchen.. 1po Once Daily 8)  Fexofenadine Hcl 180 Mg Tabs (Fexofenadine Hcl) .Marland Kitchen.. 1 By Mouth Once Daily As Needed Allergies 9)  Oxycodone Hcl 5 Mg Tabs (Oxycodone Hcl) .Marland Kitchen.. 1-2 By Mouth Q 6 Hrs As Needed 10)  Zyrtec Allergy 10 Mg Tabs (  Cetirizine Hcl) .Marland Kitchen.. 1 By Mouth Qd 11)  Sertraline Hcl 100 Mg Tabs (Sertraline Hcl) .Marland Kitchen.. 1 By Mouth Once Daily 12)  Diazepam 5 Mg Tabs (Diazepam) .Marland Kitchen.. 1 By Mouth X 1  - To Take 30 Min Prior To Procedure Dec 2 13)  Colace 100 Mg Caps (Docusate Sodium) .Marland Kitchen.. 1 By Mouth Two Times A Day  Allergies (verified): No Known Drug Allergies  Past History:  Past Medical History: Hypertension Allergic rhinitis Obesity Osteoarthritis Low back pain Hx of Positive PPD Erectile Dysfunction Benign prostatic hypertrophy Obstructive Sleep Apnea Hyperlipidemia c-spine disc disease GERD Depression Asthma Colonic polyps, hx of - adenomatous Diverticulosis, colon  variable medical compliance PMR - probable  DDD Renal insufficiency  Review of Systems  The patient denies fever, weight loss, headaches,  hematochezia, and severe indigestion/heartburn.    Physical Exam  General:  alert and overweight-appearing.  NAD, nontoxic Lungs:  normal respiratory effort and normal breath sounds.   Heart:  normal rate and regular rhythm.   Abdomen:  soft, non-tender, and normal bowel sounds.  no distention, no guarding, no rigidity, and no rebound tenderness.     Impression & Recommendations:  Problem # 1:  CONSTIPATION (ICD-564.00)  exam benign - educated on options for tx of same - to call if worse pain, any n/v or fever  His updated medication list for this problem includes:    Colace 100 Mg Caps (Docusate sodium) .Marland Kitchen... 1 by mouth two times a day    Miralax Pack (Polyethylene glycol 3350) .Marland Kitchen... 1 pqck with 8 oz water two times a day until +bm    Fleet Liquid Glycerin Supp 5.6 Gm/dose Enem (Glycerin (laxative)) .Marland Kitchen... 1 per rectum every 6 hours as needed for constipation symptoms  Discussed dietary fiber measures and increased water intake.   Complete Medication List: 1)  Rapaflo 8 Mg Caps (Silodosin) .Marland Kitchen.. 1po once daily 2)  Amlodipine Besylate 5 Mg Tabs (Amlodipine besylate) .Marland Kitchen.. 1po once daily 3)  Flexeril 5 Mg Tabs (Cyclobenzaprine hcl) .Marland Kitchen.. 1 by mouth three times a day as needed 4)  Nasacort Aq 55 Mcg/act Aers (Triamcinolone acetonide(nasal)) .... 2 spray/side once daily as needed allergies 5)  Omeprazole 20 Mg Cpdr (Omeprazole) .... 2po once daily 6)  Aspir-low 81 Mg Tbec (Aspirin) .Marland Kitchen.. 1po once daily 7)  Hydrochlorothiazide 12.5 Mg Caps (Hydrochlorothiazide) .Marland Kitchen.. 1po once daily 8)  Fexofenadine Hcl 180 Mg Tabs (Fexofenadine hcl) .Marland Kitchen.. 1 by mouth once daily as needed allergies 9)  Oxycodone Hcl 5 Mg Tabs (Oxycodone hcl) .Marland Kitchen.. 1-2 by mouth q 6 hrs as needed 10)  Zyrtec Allergy 10 Mg Tabs (Cetirizine hcl) .Marland Kitchen.. 1 by mouth qd 11)  Sertraline Hcl 100 Mg Tabs (Sertraline hcl) .Marland Kitchen.. 1 by mouth once daily 12)  Diazepam 5 Mg Tabs (Diazepam) .Marland Kitchen.. 1 by mouth x 1  - to take 30 min prior to procedure dec  2 13)  Colace 100 Mg Caps (Docusate sodium) .Marland Kitchen.. 1 by mouth two times a day 14)  Miralax Pack (Polyethylene glycol 3350) .Marland Kitchen.. 1 pqck with 8 oz water two times a day until +bm 15)  Fleet Liquid Glycerin Supp 5.6 Gm/dose Enem (Glycerin (laxative)) .Marland Kitchen.. 1 per rectum every 6 hours as needed for constipation symptoms  Patient Instructions: 1)  it was good to see you today. 2)  use Miralax two times a day and Fleets glycerin suppository for constipation until movement - your prescriptions have been electronically submitted to your pharmacy. Please take as directed. Contact our office if you  believe you're having problems with the medication(s).  3)  also drink lots of water (64oz/day) until +BM - 4)  drink prune juice and eat foods high in fiber - bran flakes, vegetables, fruits, etc 5)  Please schedule a follow-up appointment with dr. Jenny Reichmann in 2 weeks, call sooner if problems.  Prescriptions: FLEET LIQUID GLYCERIN SUPP 5.6 GM/DOSE ENEM (GLYCERIN (LAXATIVE)) 1 per rectum every 6 hours as needed for constipation symptoms  #20 x 0   Entered and Authorized by:   Rowe Clack MD   Signed by:   Rowe Clack MD on 09/25/2010   Method used:   Electronically to        [facility] (retail)       [place], [place]  [id number]       Ph: [phone]       Fax: [phone]   [id number] MIRALAX  PACK (POLYETHYLENE GLYCOL 3350) 1 pqck with 8 oz water two times a day until +BM  #20 x 0   Entered and Authorized by:   Rowe Clack MD   Signed by:   Rowe Clack MD on 09/25/2010   Method used:   Electronically to        [facility] (retail)       [place], [place]  [id number]       Ph: [phone]       Fax: [phone]   RxID:   [id number]    Orders Added: 1)  Est. Patient Level III [id number]

## 2010-10-10 ENCOUNTER — Other Ambulatory Visit: Payer: PRIVATE HEALTH INSURANCE

## 2010-10-10 ENCOUNTER — Other Ambulatory Visit: Payer: Self-pay | Admitting: Internal Medicine

## 2010-10-10 ENCOUNTER — Encounter: Payer: Self-pay | Admitting: Internal Medicine

## 2010-10-10 ENCOUNTER — Encounter (INDEPENDENT_AMBULATORY_CARE_PROVIDER_SITE_OTHER): Payer: PRIVATE HEALTH INSURANCE | Admitting: Internal Medicine

## 2010-10-10 DIAGNOSIS — Z0000 Encounter for general adult medical examination without abnormal findings: Secondary | ICD-10-CM

## 2010-10-10 DIAGNOSIS — E785 Hyperlipidemia, unspecified: Secondary | ICD-10-CM

## 2010-10-10 DIAGNOSIS — R109 Unspecified abdominal pain: Secondary | ICD-10-CM

## 2010-10-10 DIAGNOSIS — Z125 Encounter for screening for malignant neoplasm of prostate: Secondary | ICD-10-CM

## 2010-10-10 DIAGNOSIS — M353 Polymyalgia rheumatica: Secondary | ICD-10-CM

## 2010-10-10 LAB — CBC WITH DIFFERENTIAL/PLATELET
Basophils Relative: 0.7 % (ref 0.0–3.0)
Hemoglobin: 14.1 g/dL (ref 13.0–17.0)
Lymphocytes Relative: 40.5 % (ref 12.0–46.0)
MCHC: 33.9 g/dL (ref 30.0–36.0)
Monocytes Relative: 15.7 % — ABNORMAL HIGH (ref 3.0–12.0)
Neutro Abs: 1.4 10*3/uL (ref 1.4–7.7)
RBC: 4.95 Mil/uL (ref 4.22–5.81)

## 2010-10-10 LAB — HEPATIC FUNCTION PANEL
AST: 25 U/L (ref 0–37)
Albumin: 3.5 g/dL (ref 3.5–5.2)
Alkaline Phosphatase: 85 U/L (ref 39–117)
Total Protein: 6.5 g/dL (ref 6.0–8.3)

## 2010-10-10 LAB — LDL CHOLESTEROL, DIRECT: Direct LDL: 156.9 mg/dL

## 2010-10-10 LAB — URINALYSIS
Leukocytes, UA: NEGATIVE
Nitrite: NEGATIVE
Specific Gravity, Urine: 1.02 (ref 1.000–1.030)
pH: 6 (ref 5.0–8.0)

## 2010-10-10 LAB — PSA: PSA: 0.78 ng/mL (ref 0.10–4.00)

## 2010-10-10 LAB — BASIC METABOLIC PANEL
CO2: 30 mEq/L (ref 19–32)
Calcium: 9 mg/dL (ref 8.4–10.5)
GFR: 56.2 mL/min — ABNORMAL LOW (ref >60.00)
Potassium: 4.2 mEq/L (ref 3.5–5.1)
Sodium: 139 mEq/L (ref 135–145)

## 2010-10-10 LAB — LIPASE: Lipase: 31 U/L (ref 11.0–59.0)

## 2010-10-19 NOTE — Assessment & Plan Note (Signed)
Summary: cpx   Vital Signs:  Patient profile:   65 year old male Height:      70 inches Weight:      279 pounds BMI:     40.18 O2 Sat:      97 % on Room air Temp:     98.1 degrees F oral Pulse rate:   64 / minute BP sitting:   122 / 80  (left arm) Cuff size:   large  Vitals Entered By: Shirlean Mylar Ewing CMA Deborra Medina) (October 10, 2010 8:33 AM)  O2 Flow:  Room air  CC: ADult Physical/RE   Primary Care Provider:  Cathlean Cower, MD  CC:  ADult Physical/RE.  History of Present Illness: here for wellnes and f/u - overall doing ok except for ongoing constipation worse for aoubt 4 wks, despite eval and tx in the ER and recent eval and tx per Dr Hermina Staggers as well;  Last BM yesterday but very small, still qutie uncomfortable;  no blood, wt loss, fever, n/v.  Does has some dysphagia for about 4 wks as well , now constant with each feeding , also with bloating and hiccups as well.  Has tried OTC metamucil,stool softer, miralax.   Has not tried dulcolox, MOM, magciitrate or enemas or suppostitories.  No longer taking the oxycodone after last fx aug 2011.  Also not taking the PPI, but denies reflux, still with hiccups though.   Pt denies CP, worsening sob, doe, wheezing, orthopnea, pnd, worsening LE edema, palps, dizziness or syncope  Pt denies new neuro symptoms such as headache, facial or extremity weakness Pt denies polydipsia, polyuria   Overall good compliance with meds, trying to follow low chol diet, wt stable, little excercise however .  No fever, wt loss, night sweats, loss of appetite or other constitutional symptoms  Overall good compliance with meds, and good tolerability.  Denies worsening depressive symptoms, suicidal ideation, or panic.   Pt states good ability with ADL's, low fall risk, home safety reviewed and adequate, no significant change in hearing or vision, trying to follow lower chol diet, and occasionally active only with regular excercise.    last colonoscopy 2008  - due for f/u 2013,  and  no hx of EGD per pt though he has esoph stricture listed as one of his problems.   Problems Prior to Update: 1)  Constipation  (ICD-564.00) 2)  Sinusitis- Acute-nos  (ICD-461.9) 3)  Cerebrovascular Accident, Hx of  (ICD-V12.50) 4)  Memory Loss  (ICD-780.93) 5)  Headache  (ICD-784.0) 6)  Neck Pain  (ICD-723.1) 7)  Renal Insufficiency  (ICD-588.9) 8)  Abdominal Pain, Lower  (ICD-789.09) 9)  Uti  (ICD-599.0) 10)  Uri  (ICD-465.9) 11)  Pvd With Claudication  (ICD-443.89) 12)  Leg Pain, Bilateral  (ICD-729.5) 13)  Chest Pain  (ICD-786.50) 14)  Abdominal Pain, Epigastric  (ICD-789.06) 15)  Other Dysphagia  (ICD-787.29) 16)  Back Pain  (ICD-724.5) 17)  Preventive Health Care  (ICD-V70.0) 18)  Polymyalgia Rheumatica  (ICD-725) 19)  Chest Pain  (ICD-786.50) 20)  Polyarthralgia  (ICD-719.49) 21)  Esophageal Stricture  (ICD-530.3) 22)  Chest Pain  (ICD-786.50) 23)  Hepatotoxicity, Drug-induced, Risk of  (ICD-V58.69) 24)  Dysphagia Unspecified  (ICD-787.20) 25)  Hemorrhoids, Recurrent  (ICD-455.6) 26)  Preventive Health Care  (ICD-V70.0) 27)  Diverticulosis, Colon  (ICD-562.10) 28)  Colonic Polyps, Hx of  (ICD-V12.72) 29)  Asthma  (ICD-493.90) 30)  Depression  (ICD-311) 31)  Gerd  (ICD-530.81) 32)  Hyperlipidemia  (ICD-272.4) 33)  Lung  Nodule  (ICD-518.89) 34)  Sleep Apnea, Obstructive, Moderate  (ICD-327.23) 35)  Benign Prostatic Hypertrophy  (ICD-600.00) 36)  Erectile Dysfunction  (ICD-302.72) 37)  Low Back Pain  (ICD-724.2) 38)  Osteoarthritis  (ICD-715.90) 39)  Allergic Rhinitis  (ICD-477.9) 40)  Degeneration, Cervical Disc  (ICD-722.4) 41)  Disc Disease, Cervical  (ICD-722.4) 42)  Degenerative Joint Disease  (ICD-715.90) 43)  Tb Skin Test, Positive  (ICD-795.5) 44)  Obesity  (ICD-278.00) 45)  Hypertension  (ICD-401.9)  Medications Prior to Update: 1)  Rapaflo 8 Mg Caps (Silodosin) .Marland Kitchen.. 1po Once Daily 2)  Amlodipine Besylate 5 Mg Tabs (Amlodipine Besylate) .Marland Kitchen.. 1po Once  Daily 3)  Flexeril 5 Mg Tabs (Cyclobenzaprine Hcl) .Marland Kitchen.. 1 By Mouth Three Times A Day As Needed 4)  Nasacort Aq 55 Mcg/act  Aers (Triamcinolone Acetonide(Nasal)) .... 2 Spray/side Once Daily As Needed Allergies 5)  Omeprazole 20 Mg Cpdr (Omeprazole) .... 2po Once Daily 6)  Aspir-Low 81 Mg Tbec (Aspirin) .Marland Kitchen.. 1po Once Daily 7)  Hydrochlorothiazide 12.5 Mg Caps (Hydrochlorothiazide) .Marland Kitchen.. 1po Once Daily 8)  Fexofenadine Hcl 180 Mg Tabs (Fexofenadine Hcl) .Marland Kitchen.. 1 By Mouth Once Daily As Needed Allergies 9)  Oxycodone Hcl 5 Mg Tabs (Oxycodone Hcl) .Marland Kitchen.. 1-2 By Mouth Q 6 Hrs As Needed 10)  Zyrtec Allergy 10 Mg Tabs (Cetirizine Hcl) .Marland Kitchen.. 1 By Mouth Qd 11)  Sertraline Hcl 100 Mg Tabs (Sertraline Hcl) .Marland Kitchen.. 1 By Mouth Once Daily 12)  Diazepam 5 Mg Tabs (Diazepam) .Marland Kitchen.. 1 By Mouth X 1  - To Take 30 Min Prior To Procedure Dec 2 13)  Colace 100 Mg Caps (Docusate Sodium) .Marland Kitchen.. 1 By Mouth Two Times A Day 14)  Miralax  Pack (Polyethylene Glycol 3350) .Marland Kitchen.. 1 Pqck With 8 Oz Water Two Times A Day Until +bm 15)  Fleet Liquid Glycerin Supp 5.6 Gm/dose Enem (Glycerin (Laxative)) .Marland Kitchen.. 1 Per Rectum Every 6 Hours As Needed For Constipation Symptoms  Current Medications (verified): 1)  Rapaflo 8 Mg Caps (Silodosin) .Marland Kitchen.. 1po Once Daily 2)  Amlodipine Besylate 5 Mg Tabs (Amlodipine Besylate) .Marland Kitchen.. 1po Once Daily 3)  Flexeril 5 Mg Tabs (Cyclobenzaprine Hcl) .Marland Kitchen.. 1 By Mouth Three Times A Day As Needed 4)  Nasacort Aq 55 Mcg/act  Aers (Triamcinolone Acetonide(Nasal)) .... 2 Spray/side Once Daily As Needed Allergies 5)  Omeprazole 20 Mg Cpdr (Omeprazole) .... 2po Once Daily 6)  Aspir-Low 81 Mg Tbec (Aspirin) .Marland Kitchen.. 1po Once Daily 7)  Hydrochlorothiazide 12.5 Mg Caps (Hydrochlorothiazide) .Marland Kitchen.. 1po Once Daily 8)  Fexofenadine Hcl 180 Mg Tabs (Fexofenadine Hcl) .Marland Kitchen.. 1 By Mouth Once Daily As Needed Allergies 9)  Zyrtec Allergy 10 Mg Tabs (Cetirizine Hcl) .Marland Kitchen.. 1 By Mouth Qd 10)  Sertraline Hcl 100 Mg Tabs (Sertraline Hcl) .Marland Kitchen.. 1 By  Mouth Once Daily 11)  Diazepam 5 Mg Tabs (Diazepam) .Marland Kitchen.. 1 By Mouth X 1  - To Take 30 Min Prior To Procedure Dec 2 12)  Colace 100 Mg Caps (Docusate Sodium) .Marland Kitchen.. 1 By Mouth Two Times A Day 13)  Miralax  Pack (Polyethylene Glycol 3350) .Marland Kitchen.. 1 Pqck With 8 Oz Water Two Times A Day Until +bm 14)  Fleet Liquid Glycerin Supp 5.6 Gm/dose Enem (Glycerin (Laxative)) .Marland Kitchen.. 1 Per Rectum Every 6 Hours As Needed For Constipation Symptoms  Allergies (verified): No Known Drug Allergies  Past History:  Past Medical History: Last updated: 09/25/2010 Hypertension Allergic rhinitis Obesity Osteoarthritis Low back pain Hx of Positive PPD Erectile Dysfunction Benign prostatic hypertrophy Obstructive Sleep Apnea Hyperlipidemia c-spine disc disease GERD  Depression Asthma Colonic polyps, hx of - adenomatous Diverticulosis, colon  variable medical compliance PMR - probable  DDD Renal insufficiency  Past Surgical History: Last updated: 12/16/2009 Total knee replacement Rotator cuff repair - left  Family History: Last updated: 12/21/2009 asthma father with heart disease mother with brain tumor brother with lupus HTN No FH of Colon Cancer:  Social History: Last updated: 03/23/2008 Married seperated 6 children - 1 died with suicide, 1 with 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   Risk Factors: Exercise: no (03/23/2008)  Risk Factors: Smoking Status: quit (12/04/2007)  Review of Systems  The patient denies anorexia, fever, vision loss, decreased hearing, hoarseness, chest pain, syncope, dyspnea on exertion, peripheral edema, prolonged cough, headaches, hemoptysis, melena, hematochezia, severe indigestion/heartburn, hematuria, muscle weakness, suspicious skin lesions, transient blindness, difficulty walking, depression, unusual weight change, abnormal  bleeding, enlarged lymph nodes, and angioedema.         all otherwise negative per pt -  except for tops of mid foot bilat without swelling, erythema, ulcer , drainage , mild to mod, intermittent for one yr;       Physical Exam  General:  alert and overweight-appearing.  NAD, nontoxic Head:  normocephalic and atraumatic.   Eyes:  vision grossly intact, pupils equal, and pupils round.   Ears:  R ear normal and L ear normal.  , but sinus tender bilat Nose:  no external deformity, nasal dischargemucosal pallor, and mucosal edema.   Mouth:  pharyngeal erythema and fair dentition.   Neck:  supple and no masses.   Lungs:  normal respiratory effort and normal breath sounds.   Heart:  normal rate and regular rhythm.   Abdomen:  soft,  and normal bowel sounds.  no distention, no guarding, no rigidity, and no rebound tenderness but has midl diffuse tender to mid and upper bilat abd primarily, some to lower quads as well  Msk:  no joint tenderness and no joint swelling.   Extremities:  no edema, no erythema  Neurologic:  strength normal in all extremities and gait normal.   Skin:  color normal and no rashes.   Psych:  not depressed appearing and slightly anxious.     Impression & Recommendations:  Problem # 1:  Preventive Health Care (ICD-V70.0)  Overall doing well, age appropriate education and counseling updated, referral for preventive services and immunizations addressed, dietary counseling and smoking status adressed , most recent labs reviewed I have personally reviewed and have noted 1.The patient's medical and social history 2.Their use of alcohol, tobacco or illicit drugs 3.Their current medications and supplements 4. Functional ability including ADL's, fall risk, home safety risk, hearing & visual impairment  5.Diet and physical activities 6.Evidence for depression or mood disorders The patients weight, height, BMI  have been recorded in the chart I have made referrals, counseling  and provided education to the patient based review of the above   Orders: TLB-BMP (Basic Metabolic Panel-BMET) (99991111) TLB-CBC Platelet - w/Differential (85025-CBCD) TLB-Hepatic/Liver Function Pnl (80076-HEPATIC) TLB-Lipid Panel (80061-LIPID) TLB-TSH (Thyroid Stimulating Hormone) (84443-TSH) TLB-PSA (Prostate Specific Antigen) (84153-PSA) TLB-Udip ONLY (81003-UDIP)  Problem # 2:  ABDOMINAL PAIN OTHER SPECIFIED SITE (ICD-789.09)  The following medications were removed from the medication list:    Oxycodone Hcl 5 Mg Tabs (Oxycodone hcl) .Marland Kitchen... 1-2 by mouth q 6 hrs as needed His updated medication list for this problem includes:  Flexeril 5 Mg Tabs (Cyclobenzaprine hcl) .Marland Kitchen... 1 by mouth three times a day as needed    Aspir-low 81 Mg Tbec (Aspirin) .Marland Kitchen... 1po once daily mostly bilat upper, to ree-start the PPI for now, OTC laxative as needed, check labs today, refer GI - likely needs EGD, also keep in mind GB  Orders: Gastroenterology Referral (GI) TLB-Lipase (83690-LIPASE)  Problem # 3:  FOOT PAIN, BILATERAL (ICD-729.5)  exam benign = to podiatry for eval  Orders: Podiatry Referral (Podiatry)  Problem # 4:  HYPERTENSION (ICD-401.9)  His updated medication list for this problem includes:    Amlodipine Besylate 5 Mg Tabs (Amlodipine besylate) .Marland Kitchen... 1po once daily    Hydrochlorothiazide 12.5 Mg Caps (Hydrochlorothiazide) .Marland Kitchen... 1po once daily  BP today: 122/80 Prior BP: 128/82 (09/25/2010)  Labs Reviewed: K+: 4.6 (03/20/2010) Creat: : 1.8 (03/20/2010)   Chol: 202 (09/16/2009)   HDL: 47.20 (09/16/2009)   LDL: 111 (03/23/2008)   TG: 55.0 (09/16/2009) .stabl e  Complete Medication List: 1)  Rapaflo 8 Mg Caps (Silodosin) .Marland Kitchen.. 1po once daily 2)  Amlodipine Besylate 5 Mg Tabs (Amlodipine besylate) .Marland Kitchen.. 1po once daily 3)  Flexeril 5 Mg Tabs (Cyclobenzaprine hcl) .Marland Kitchen.. 1 by mouth three times a day as needed 4)  Nasacort Aq 55 Mcg/act Aers (Triamcinolone acetonide(nasal)) ....  2 spray/side once daily as needed allergies 5)  Omeprazole 20 Mg Cpdr (Omeprazole) .... 2po once daily 6)  Aspir-low 81 Mg Tbec (Aspirin) .Marland Kitchen.. 1po once daily 7)  Hydrochlorothiazide 12.5 Mg Caps (Hydrochlorothiazide) .Marland Kitchen.. 1po once daily 8)  Fexofenadine Hcl 180 Mg Tabs (Fexofenadine hcl) .Marland Kitchen.. 1 by mouth once daily as needed allergies 9)  Zyrtec Allergy 10 Mg Tabs (Cetirizine hcl) .Marland Kitchen.. 1 by mouth qd 10)  Sertraline Hcl 100 Mg Tabs (Sertraline hcl) .Marland Kitchen.. 1 by mouth once daily 11)  Diazepam 5 Mg Tabs (Diazepam) .Marland Kitchen.. 1 by mouth x 1  - to take 30 min prior to procedure dec 2 12)  Colace 100 Mg Caps (Docusate sodium) .Marland Kitchen.. 1 by mouth two times a day 13)  Miralax Pack (Polyethylene glycol 3350) .Marland Kitchen.. 1 pqck with 8 oz water two times a day until +bm 14)  Fleet Liquid Glycerin Supp 5.6 Gm/dose Enem (Glycerin (laxative)) .Marland Kitchen.. 1 per rectum every 6 hours as needed for constipation symptoms  Other Orders: TLB-Sedimentation Rate (ESR) (85652-ESR)  Patient Instructions: 1)  please re-start the generic prilosec 20 mg - 2 per day 2)  Continue all previous medications as before this visit  3)  You will be contacted about the referral(s) to: GI and podiatry 4)  You are OK to cont the stool softner, as well as consider using Milk of Magnesia as needed, or even once weekly Magnesium Citrate for now 5)  Please go to the Lab in the basement for your blood and/or urine tests today  6)  Please call the number on the [facility] for results of your testing  7)  Please schedule a follow-up appointment in 6 months. Prescriptions: OMEPRAZOLE 20 MG CPDR (OMEPRAZOLE) 2po once daily  #60 x 11   Entered and Authorized by:   Biagio Borg MD   Signed by:   Biagio Borg MD on 10/10/2010   Method used:   Print then Give to Patient   RxID:   [id number]    Orders Added: 1)  Gastroenterology Referral [GI] 2)  TLB-Lipase [83690-LIPASE] 3)  TLB-BMP (Basic Metabolic Panel-BMET) 123456 4)  TLB-CBC Platelet -  w/Differential [85025-CBCD] 5)  TLB-Hepatic/Liver Function Pnl [  80076-HEPATIC] 6)  TLB-Lipid Panel [80061-LIPID] 7)  TLB-TSH (Thyroid Stimulating Hormone) [84443-TSH] 8)  TLB-PSA (Prostate Specific Antigen) [84153-PSA] 9)  TLB-Udip ONLY [81003-UDIP] 10)  TLB-Sedimentation Rate (ESR) [85652-ESR] 11)  Est. Patient 40-64 years [id number])  Podiatry Referral [Podiatry]

## 2010-10-23 ENCOUNTER — Telehealth: Payer: Self-pay | Admitting: Gastroenterology

## 2010-10-25 ENCOUNTER — Encounter: Payer: Self-pay | Admitting: Physician Assistant

## 2010-10-25 ENCOUNTER — Encounter: Payer: Self-pay | Admitting: Gastroenterology

## 2010-10-25 ENCOUNTER — Other Ambulatory Visit: Payer: PRIVATE HEALTH INSURANCE

## 2010-10-25 ENCOUNTER — Other Ambulatory Visit: Payer: Self-pay | Admitting: Physician Assistant

## 2010-10-25 ENCOUNTER — Ambulatory Visit (INDEPENDENT_AMBULATORY_CARE_PROVIDER_SITE_OTHER): Payer: PRIVATE HEALTH INSURANCE | Admitting: Physician Assistant

## 2010-10-25 DIAGNOSIS — Z86010 Personal history of colonic polyps: Secondary | ICD-10-CM

## 2010-10-25 DIAGNOSIS — K219 Gastro-esophageal reflux disease without esophagitis: Secondary | ICD-10-CM

## 2010-10-25 DIAGNOSIS — R109 Unspecified abdominal pain: Secondary | ICD-10-CM

## 2010-10-25 DIAGNOSIS — K5900 Constipation, unspecified: Secondary | ICD-10-CM

## 2010-10-25 DIAGNOSIS — R1310 Dysphagia, unspecified: Secondary | ICD-10-CM

## 2010-10-25 LAB — TSH: TSH: 1.56 u[IU]/mL (ref 0.35–5.50)

## 2010-10-29 LAB — URINALYSIS, ROUTINE W REFLEX MICROSCOPIC
Bilirubin Urine: NEGATIVE
Ketones, ur: NEGATIVE mg/dL
Nitrite: NEGATIVE
pH: 6 (ref 5.0–8.0)

## 2010-10-30 ENCOUNTER — Encounter: Payer: Self-pay | Admitting: Gastroenterology

## 2010-10-30 ENCOUNTER — Encounter (AMBULATORY_SURGERY_CENTER): Payer: PRIVATE HEALTH INSURANCE | Admitting: Gastroenterology

## 2010-10-30 DIAGNOSIS — R198 Other specified symptoms and signs involving the digestive system and abdomen: Secondary | ICD-10-CM

## 2010-10-30 DIAGNOSIS — K222 Esophageal obstruction: Secondary | ICD-10-CM

## 2010-10-30 DIAGNOSIS — R1310 Dysphagia, unspecified: Secondary | ICD-10-CM

## 2010-10-31 ENCOUNTER — Encounter (INDEPENDENT_AMBULATORY_CARE_PROVIDER_SITE_OTHER): Payer: Self-pay | Admitting: *Deleted

## 2010-10-31 NOTE — Progress Notes (Signed)
Summary: Triage:  Abdominal pain  Phone Note Other Incoming   Caller: Debra @ [facility]  [phone] Summary of Call: pt. having abd pain...requesting appt. and Dr. Deatra Ina does not have any avail. appts. Initial call taken by: Webb Laws,  October 23, 2010 1:45 PM  Follow-up for Phone Call        Appt made with Nicoletta Ba, PA 10/25/10@10am . Hilda Blades to call and notify the patient of appointment date and time. Follow-up by: Rosanne Sack RN,  October 23, 2010 2:23 PM

## 2010-10-31 NOTE — Assessment & Plan Note (Signed)
Summary: Abdominal pain   History of Present Illness Visit Type: Follow-up Visit Primary GI MD: Erskine Emery MD [facility] Primary Provider: Cathlean Cower, MD Requesting Provider: Cathlean Cower, MD Chief Complaint: Dysphagia, epigastric pain and 1-2 weekly for bowel movements( small amt) History of Present Illness:   [name] KNOWN TO DR. KAPLAN. HE HAS HX OF ADENOMATOUS COLON POLYPS,DIVERTICULAR DISEASE, AND GERD. LAST COLON 09/2006. EGD 2009 WITH EARLY STRICTURE DILATED. PT COMES IN NOW WITH C/O SIGNIFICANT CHANGE IN BOWEL HABITS OVER THE PAST 2 MONTHS. HE HAS ALWAYS BEEN VERY REGULAR AND IS NOW CONSTIPATED, AND NOW GOING DAYS WITHOUT A BM. DENIES ABDOMINAL PAIN, NO MELENA, OR HEME. APPETITE OK, WEIGHT STABLE. NO SUCCESS WITH OTC PRODUCTS THUS FAR. HE ALSO C/O RECURRENT DYSPHAGIA PRIMARILY TO SOLIDS,AVOIDS BREAD AND CERTAIN  MEATS. Marland Kitchen HE IS ON OMEPRAZLOE 20 MG DAILY WHICH HE DOES NOT FEEL IS WORKING WELL. HE IS HAVING INCREASED REFLUX, AND BELCHING PARTICULARLY AT N IGHT.    GI Review of Systems    Reports abdominal pain, acid reflux, dysphagia with liquids, and  dysphagia with solids.     Location of  Abdominal pain: epigastric area.    Denies belching, bloating, chest pain, heartburn, loss of appetite, nausea, vomiting, vomiting blood, weight loss, and  weight gain.      Reports constipation.     Denies anal fissure, black tarry stools, change in bowel habit, diarrhea, diverticulosis, fecal incontinence, heme positive stool, hemorrhoids, irritable bowel syndrome, jaundice, light color stool, liver problems, rectal bleeding, and  rectal pain.    Current Medications (verified): 1)  Rapaflo 8 Mg Caps (Silodosin) .Marland Kitchen.. 1po Once Daily 2)  Amlodipine Besylate 5 Mg Tabs (Amlodipine Besylate) .Marland Kitchen.. 1po Once Daily 3)  Flexeril 5 Mg Tabs (Cyclobenzaprine Hcl) .Marland Kitchen.. 1 By Mouth Three Times A Day As Needed 4)  Nasacort Aq 55 Mcg/act  Aers (Triamcinolone Acetonide(Nasal)) .... 2 Spray/side Once Daily As  Needed Allergies 5)  Omeprazole 20 Mg Cpdr (Omeprazole) .... 2po Once Daily 6)  Aspir-Low 81 Mg Tbec (Aspirin) .Marland Kitchen.. 1po Once Daily 7)  Hydrochlorothiazide 12.5 Mg Caps (Hydrochlorothiazide) .Marland Kitchen.. 1po Once Daily 8)  Fexofenadine Hcl 180 Mg Tabs (Fexofenadine Hcl) .Marland Kitchen.. 1 By Mouth Once Daily As Needed Allergies 9)  Zyrtec Allergy 10 Mg Tabs (Cetirizine Hcl) .Marland Kitchen.. 1 By Mouth Qd 10)  Fleet Liquid Glycerin Supp 5.6 Gm/dose Enem (Glycerin (Laxative)) .Marland Kitchen.. 1 Per Rectum Every 6 Hours As Needed For Constipation Symptoms 11)  Lovastatin 40 Mg Tabs (Lovastatin) .Marland Kitchen.. 1 By Mouth Once Daily  Allergies (verified): No Known Drug Allergies  Past History:  Past Medical History: Reviewed history from 09/25/2010 and no changes required. Hypertension Allergic rhinitis Obesity Osteoarthritis Low back pain Hx of Positive PPD Erectile Dysfunction Benign prostatic hypertrophy Obstructive Sleep Apnea Hyperlipidemia c-spine disc disease GERD Depression Asthma Colonic polyps, hx of - adenomatous Diverticulosis, colon  variable medical compliance PMR - probable  DDD Renal insufficiency  Past Surgical History: Total knee replacement x2 Rotator cuff repair - left COLONOSCOPY 2008-KAPLAN EGD 2009 -KAPLAN  Family History: Reviewed history from 12/21/2009 and no changes required. asthma father with heart disease mother with brain tumor brother with lupus HTN No FH of Colon Cancer:  Social History: Reviewed history from 03/23/2008 and no changes required. 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  Review of Systems  The patient denies allergy/sinus, anemia, anxiety-new, arthritis/joint pain, back pain, blood in urine, breast changes/lumps, change in vision, confusion, cough, coughing up blood, depression-new,  fainting, fatigue, fever, headaches-new, hearing problems, heart murmur, heart rhythm changes, itching, menstrual pain, muscle pains/cramps, night sweats, nosebleeds, pregnancy symptoms, shortness of breath, skin rash, sleeping problems, sore throat, swelling of feet/legs, swollen lymph glands, thirst - excessive , urination - excessive , urination changes/pain, urine leakage, vision changes, and voice change.         SEE HPI  Vital Signs:  Patient profile:   65 year old male Height:      70 inches Weight:      279.38 pounds BMI:     40.23 Pulse rate:   76 / minute Pulse rhythm:   regular BP sitting:   160 / 90  (left arm) Cuff size:   large  Vitals Entered By: June McMurray CMA Deborra Medina) (October 25, 2010 10:03 AM)  Physical Exam  General:  Well developed, well nourished, no acute distress. Head:  Normocephalic and atraumatic. Eyes:  PERRLA, no icterus. Lungs:  Clear throughout to auscultation. Heart:  Regular rate and rhythm; no murmurs, rubs,  or bruits. Abdomen:  SOFT, NONDISTENDED, NO FOCAL TENDERNESS, NO MASS OR HSM,BS+ Rectal:  NOT DONE Extremities:  No clubbing, cyanosis, edema or deformities noted. Neurologic:  Alert and  oriented x4;  grossly normal neurologically. Psych:  Alert and cooperative. Normal mood and affect.   Impression & Recommendations:  Problem # 1:  CONSTIPATION (ICD-564.00) Assessment New 64 YO MALE WITH NEW ONSET CONSTIPATION X 2 MONTHS . R/O FUNCTIONAL,R/O OCCULT LESION  GIVEN HX OF MULTIPLE ADENOMATOUS POLYPS, AND CHANGE IN BOWEL HABITS WILL PROCEED WITH COLONOSCOPY . DISCUSSED IN DETAIL WITH PT. HE WAS UNHAPPY WITH MIRALAX SO WILL TRY CHRONULAC 300 CC DAILY IN THE SHORT TERM LABS AS BELOW-TSH. Orders: Colon/Endo (Colon/Endo)  Problem # 2:  OTHER DYSPHAGIA (ICD-787.29) Assessment: New CHRONIC GERD, HX OF EARLY STRICTURE-SUSPECT RECURRENT.   TRIAL OF DEXILANT 60 MG DAILY  IN PLACE OF OMEPRAZOLE SCHEDULE FOR UPPER ENDOSCOPY WITH DR. KAPLAN WITH   SAVARY DILATION IF INDICATED.DISCUSSED IN DETAIL WITH PT  Problem # 3:  DIVERTICULOSIS, COLON (ICD-562.10) Assessment: Comment Only  Orders: Colon/Endo (Colon/Endo)  Problem # 4:  COLONIC POLYPS, HX OF (ICD-V12.72) Assessment: Comment Only  Orders: Colon/Endo (Colon/Endo)  Problem # 5:  CEREBROVASCULAR ACCIDENT, HX OF (ICD-V12.50) Assessment: Comment Only  Problem # 6:  POLYMYALGIA RHEUMATICA (ICD-725) Assessment: Comment Only  Problem # 7:  PVD WITH CLAUDICATION (ICD-443.89) Assessment: Comment Only  Problem # 8:  HYPERTENSION (ICD-401.9) Assessment: Comment Only  Other Orders: TLB-TSH (Thyroid Stimulating Hormone) [id number])  Patient Instructions: 1)  Your physician has requested that you have the following labwork done today: 2)  We scheduled the EGD/Colonoscopy on 10-30-2010. 3)  Directions and brochure provided. 4)  [place] Patient Information Guide given to patient. 5)  We sent prescription for the colonoscopy prep to [facility]. 6)  Copy sent to : Dr. Cathlean Cower 7)  The medication list was reviewed and reconciled.  All changed / newly prescribed medications were explained.  A complete medication list was provided to the patient / caregiver. Prescriptions: LACTULOSE 10 GM/15ML SOLN (LACTULOSE) 30 cc 1-2 times daily for constipation  #1800 cc x 0   Entered by:   Marisue Humble NCMA   Authorized by:   Alfredia Ferguson PA-c   Signed by:   Marisue Humble NCMA  on 10/25/2010   Method used:   Electronically to        [facility] (retail)       [place], [place]  [id number]       Ph: [phone]       Fax: [phone]   RxID:   [id number] MOVIPREP 100 GM  SOLR (PEG-KCL-NACL-NASULF-NA ASC-C) As per prep instructions.  #1 x 0   Entered by:   Marisue Humble NCMA   Authorized by:   Alfredia Ferguson PA-c   Signed by:   Marisue Humble NCMA on 10/25/2010   Method used:   Electronically to        [facility] (retail)       [place], [place]  [id number]       Ph: [phone]       Fax: [phone]   RxIDLX:[id number]

## 2010-10-31 NOTE — Letter (Signed)
Summary: [facility] Instructions  [facility]  [place], [place] [address]   Phone: [phone]  Fax: [phone]       Troy Palmer    12-Apr-1946    MRN: [id number]        Procedure Day /Date: 10-30-2010     Arrival Time: 2:30 PM      Procedure Time: 3:30 PM     Location of Procedure:                    X     [facility] (4th Floor)  [place]   Starting 5 days prior to your procedure 10-25-2010 do not eat nuts, seeds, popcorn, corn, beans, peas,  salads, or any raw vegetables.  Do not take any fiber supplements (e.g. Metamucil, Citrucel, and Benefiber).  THE DAY BEFORE YOUR PROCEDURE         DATE: 3-18 2012 DAY: Sunday  1.  Drink clear liquids the entire day-NO SOLID FOOD  2.  Do not drink anything colored red or purple.  Avoid juices with pulp.  No orange juice.  3.  Drink at least 64 oz. (8 glasses) of fluid/clear liquids during the day to prevent dehydration and help the prep work efficiently.  CLEAR LIQUIDS INCLUDE: Water Jello Ice Popsicles Tea (sugar ok, no milk/cream) Powdered fruit flavored drinks Coffee (sugar ok, no milk/cream) Gatorade Juice: apple, white grape, white cranberry  Lemonade Clear bullion, consomm, broth Carbonated beverages (any kind) Strained chicken noodle soup Hard Candy                             4.  In the morning, mix first dose of MoviPrep solution:    Empty 1 Pouch A and 1 Pouch B into the disposable container    Add lukewarm drinking water to the top line of the container. Mix to dissolve    Refrigerate (mixed solution should be used within 24 hrs)  5.  Begin drinking the prep at 5:00 p.m. The MoviPrep container is divided by 4 marks.   Every 15 minutes drink the solution down to the next mark (approximately 8 oz) until the full liter is complete.   6.  Follow completed prep with 16 oz of clear liquid of your choice (Nothing red or purple).  Continue  to drink clear liquids until bedtime.  7.  Before going to bed, mix second dose of MoviPrep solution:    Empty 1 Pouch A and 1 Pouch B into the disposable container    Add lukewarm drinking water to the top line of the container. Mix to dissolve    Refrigerate  THE DAY OF YOUR PROCEDURE      DATE: 10-30-2010 DAY: Monday  Beginning at 10:30 AM  (5 hours before procedure):         1. Every 15 minutes, drink the solution down to the next mark (approx 8 oz) until the full liter is complete.  2. Follow completed prep with 16 oz. of clear liquid of your choice.    3. You may drink clear liquids until 1:30 PM  (2 HOURS BEFORE PROCEDURE).   MEDICATION INSTRUCTIONS  Unless otherwise instructed, you should take regular prescription medications with a small sip of water   as early as possible the morning of your procedure.        OTHER INSTRUCTIONS  You will need a responsible adult  at least 65 years of age to accompany you and drive you home.   This person must remain in the waiting room during your procedure.  Wear loose fitting clothing that is easily removed.  Leave jewelry and other valuables at home.  However, you may wish to bring a book to read or  an iPod/MP3 player to listen to music as you wait for your procedure to start.  Remove all body piercing jewelry and leave at home.  Total time from sign-in until discharge is approximately 2-3 hours.  You should go home directly after your procedure and rest.  You can resume normal activities the  day after your procedure.  The day of your procedure you should not:   Drive   Make legal decisions   Operate machinery   Drink alcohol   Return to work  You will receive specific instructions about eating, activities and medications before you leave.    The above instructions have been reviewed and explained to me by   _______________________    I fully understand and can verbalize these instructions  _____________________________ Date _________

## 2010-11-08 ENCOUNTER — Ambulatory Visit (INDEPENDENT_AMBULATORY_CARE_PROVIDER_SITE_OTHER): Payer: PRIVATE HEALTH INSURANCE | Admitting: Internal Medicine

## 2010-11-08 ENCOUNTER — Encounter: Payer: Self-pay | Admitting: Internal Medicine

## 2010-11-08 ENCOUNTER — Other Ambulatory Visit (INDEPENDENT_AMBULATORY_CARE_PROVIDER_SITE_OTHER): Payer: PRIVATE HEALTH INSURANCE

## 2010-11-08 DIAGNOSIS — E785 Hyperlipidemia, unspecified: Secondary | ICD-10-CM

## 2010-11-08 DIAGNOSIS — Z79899 Other long term (current) drug therapy: Secondary | ICD-10-CM

## 2010-11-08 DIAGNOSIS — I10 Essential (primary) hypertension: Secondary | ICD-10-CM

## 2010-11-08 DIAGNOSIS — N259 Disorder resulting from impaired renal tubular function, unspecified: Secondary | ICD-10-CM

## 2010-11-08 DIAGNOSIS — J029 Acute pharyngitis, unspecified: Secondary | ICD-10-CM

## 2010-11-08 LAB — HEPATIC FUNCTION PANEL
Albumin: 3.6 g/dL (ref 3.5–5.2)
Alkaline Phosphatase: 97 U/L (ref 39–117)

## 2010-11-08 LAB — LIPID PANEL
LDL Cholesterol: 120 mg/dL — ABNORMAL HIGH (ref 0–99)
Total CHOL/HDL Ratio: 3
Triglycerides: 55 mg/dL (ref 0.0–149.0)
VLDL: 11 mg/dL (ref 0.0–40.0)

## 2010-11-08 MED ORDER — LEVOFLOXACIN 500 MG PO TABS: 500.0000 mg | ORAL_TABLET | Freq: Every day | ORAL | Status: AC

## 2010-11-08 NOTE — Assessment & Plan Note (Signed)
stable overall by hx and exam, ok to continue meds/tx as is , on new statin, due for f/u labs - to be ordered today  Lab Results  Component Value Date   LDLCALC 111* 03/23/2008

## 2010-11-08 NOTE — Progress Notes (Signed)
Subjective:    Patient ID: Troy Palmer, male    DOB: June 29, 1946, 65 y.o.   MRN: [id number]  HPI here as walkin, to c/o acute onset severe ST x 2 days with swelling and white area to the back of the throat, assoc with low grade temp, general weakness and malaise, occasional nonprod cough, but Pt denies chest pain, increased sob or doe, wheezing, orthopnea, PND, increased LE swelling, palpitations, dizziness or syncope.    Pt denies new neurological symptoms such as new headache, or facial or extremity weakness or numbness.   Pt denies polydipsia, polyuria  Pt states overall good compliance with meds, trying to follow lower cholesterol  diet, wt overall stable but little exercise however.    Is taking and tolerating the new statin without arthralgias, bowel change or slowed cognition.   Pt denies recent wt loss, night sweats, loss of appetite, or other constitutional symptoms  Overall good compliance with treatment, and good medicine tolerability.   Past Medical History  Diagnosis Date  . TB SKIN TEST, POSITIVE 03/28/2007  . SLEEP APNEA, OBSTRUCTIVE, MODERATE 04/09/2007  . RENAL INSUFFICIENCY 03/15/2010  . PVD WITH CLAUDICATION 02/10/2010  . Polymyalgia rheumatica 07/26/2008  . POLYARTHRALGIA 07/13/2008  . Other dysphagia 11/21/2009  . Osteoarth NOS-Unspec 03/28/2007  . LUNG NODULE 12/04/2007  . LOW BACK PAIN 04/09/2007  . LEG PAIN, BILATERAL 01/25/2010  . HYPERTENSION 03/28/2007  . HYPERLIPIDEMIA 12/04/2007  . HEMORRHOIDS, RECURRENT 02/11/2008  . GERD 12/04/2007  . ESOPHAGEAL STRICTURE 05/26/2008  . ERECTILE DYSFUNCTION 04/09/2007  . DYSPHAGIA UNSPECIFIED 03/16/2008  . DIVERTICULOSIS, COLON 12/04/2007  . DEPRESSION 12/04/2007  . Degeneration of cervical intervertebral disc 03/28/2007  . COLONIC POLYPS, HX OF 12/04/2007  . CONSTIPATION 09/25/2010  . CEREBROVASCULAR ACCIDENT, HX OF 07/17/2010  . BENIGN PROSTATIC HYPERTROPHY 04/09/2007  . BACK PAIN 09/16/2009  . ASTHMA 12/04/2007  . ALLERGIC RHINITIS  04/09/2007   Past Surgical History  Procedure Date  . Total knee arthroplasty     x 2  . Rotator cuff repair     Left    reports that he has quit smoking. He does not have any smokeless tobacco history on file. He reports that he does not drink alcohol or use illicit drugs. family history includes Asthma in his other; Heart disease in his father; Hypertension in his brother; and Lupus in his brother. No Known Allergies  No current outpatient prescriptions on file prior to visit.  pt meds not abstracted prior to this visit but on file with last visit in centricity    Review of Systems Review of Systems  Constitutional: Negative for diaphoresis and unexpected weight change.  HENT: Negative for drooling and tinnitus.   Eyes: Negative for photophobia and visual disturbance.  Respiratory: Negative for choking and stridor.   Gastrointestinal: Negative for vomiting and blood in stool.  Genitourinary: Negative for hematuria and decreased urine volume.  Musculoskeletal: Negative for gait problem.  Skin: Negative for color change and wound.  Neurological: Negative for tremors and numbness.  Psychiatric/Behavioral: Negative for decreased concentration. The patient is not hyperactive.  \     Objective:   Physical Exam Physical Exam  VS noted Constitutional: Pt appears well-developed and well-nourished. but fatigued and mild ill appearing HENT: Head: Normocephalic.  Pharynx with 2+ erythema, swelling, exudate and bilat submand LA noted tender as well  Right Ear: External ear normal.  Left Ear: External ear normal.  Eyes: Conjunctivae and EOM are normal. Pupils are equal, round, and reactive to light.  Neck: Normal range of motion. Neck supple.  Cardiovascular: Normal rate and regular rhythm.   Pulmonary/Chest: Effort normal and breath sounds normal.  Abd:  Soft, NT, non-distended, + BS Neurological: Pt is alert. No cranial nerve deficit.  Skin: Skin is warm. No erythema.  Psychiatric:  Pt behavior is normal. Thought content normal.         Assessment & Plan:

## 2010-11-08 NOTE — Assessment & Plan Note (Signed)
stable overall by hx and exam, ok to continue meds/tx as is , labs reviewed  Lab Results  Component Value Date   WBC 3.6* 10/10/2010   HGB 14.1 10/10/2010   HCT 41.7 10/10/2010   PLT 265.0 10/10/2010   CHOL 215* 10/10/2010   TRIG 51.0 10/10/2010   HDL 50.40 10/10/2010   LDLDIRECT 156.9 10/10/2010   ALT 18 10/10/2010   AST 25 10/10/2010   NA 139 10/10/2010   K 4.2 10/10/2010   CL 103 10/10/2010   CREATININE 1.6* 10/10/2010   BUN 10 10/10/2010   CO2 30 10/10/2010   TSH 1.56 10/25/2010   PSA 0.78 10/10/2010

## 2010-11-08 NOTE — Assessment & Plan Note (Signed)
Most recent labs reviewed with pt ; to f/u next visit  Lab Results  Component Value Date   CREATININE 1.6* 10/10/2010

## 2010-11-08 NOTE — Assessment & Plan Note (Signed)
Moderate, acute exudative - for antibx tx today, f/u any worsening symptoms

## 2010-11-08 NOTE — Patient Instructions (Signed)
Take all new medications as prescribed Continue all other medications as before Please go to LAB in the Basement for the blood and/or urine tests to be done today Please call the number on the [place] for results in 2-3 days Please return in Aug 2012  with blood work done 3-5 days before

## 2010-11-09 NOTE — Assessment & Plan Note (Signed)
  Nurse Visit   Preventive Screening-Counseling & Management  Comments: I Troy Palmer's medical chart for the [employer] constipation study. A consent form was given to him after his procedure on 10/30/2010. After reviewing his chart the patient does not meet inclusion criteria due to BMI being 40.23. The study reqires BMI to be 35 or less. I will contact the patient.  Allergies: No Known Drug Allergies

## 2010-11-09 NOTE — Procedures (Signed)
Summary: Colonoscopy  Patient: Troy Palmer Note: All result statuses are Final unless otherwise noted.  Tests: (1) Colonoscopy (COL)   COL Colonoscopy           [facility].     [place], [place]  [address]          COLONOSCOPY PROCEDURE REPORT          PATIENT:  Troy Palmer, Troy Palmer  MR#:  [id number]     BIRTHDATE:  [birth year], 64 yrs. old  GENDER:  male          ENDOSCOPIST:  Sandy Salaam. Deatra Ina, MD     Referred by:          PROCEDURE DATE:  10/30/2010     PROCEDURE:  Diagnostic Colonoscopy     ASA CLASS:  Class II     INDICATIONS:  1) change in bowel habits          MEDICATIONS:   Fentanyl 50 mcg IV, Versed 7 mg IV          DESCRIPTION OF PROCEDURE:   After the risks benefits and     alternatives of the procedure were thoroughly explained, informed     consent was obtained.  Digital rectal exam was performed and     revealed no abnormalities.   The [id number] endoscope was     introduced through the anus and advanced to the cecum, which was     identified by the ileocecal valve, without limitations.  The     quality of the prep was excellent, using MoviPrep.  The instrument     was then slowly withdrawn as the colon was fully examined.     <<PROCEDUREIMAGES>>          FINDINGS:  A normal appearing cecum, ileocecal valve, and     appendiceal orifice were identified. The ascending, hepatic     flexure, transverse, splenic flexure, descending, sigmoid colon,     and rectum appeared unremarkable (see image1, image3, image4,     image6, image7, image9, image11, and image12).   Retroflexed views     in the rectum revealed no abnormalities.    The time to cecum =     1.50  minutes. The scope was then withdrawn (time =  6.0  min) from     the patient and the procedure completed.          COMPLICATIONS:  None          ENDOSCOPIC IMPRESSION:     1) Normal colon     RECOMMENDATIONS:     1) high fiber diet     2) pt will consider  enrollment in a constipation trial          REPEAT EXAM:   10 year(s) Colonoscopy          ______________________________     Sandy Salaam. Deatra Ina, MD          CC:  Biagio Borg, MD          n.     Lorrin Mais:   Sandy Salaam. Kaplan at 10/30/2010 03:58 PM          [name], [id number]  Note: An exclamation mark (!) indicates a result that was not dispersed into the flowsheet. Document Creation Date: 10/30/2010 3:59 PM _______________________________________________________________________  (1) Order result status: Final Collection or observation date-time: 10/30/2010 15:45 Requested date-time:  Receipt date-time:  Reported date-time:  Referring Physician:   Ordering Physician: Erskine Emery [phone]) Specimen Source:  Source: Tawanna Cooler Order Number: [id number] Lab site:   Appended Document: Colonoscopy    Clinical Lists Changes  Observations: Added new observation of COLONNXTDUE: 10/2020 (10/30/2010 16:24)

## 2010-11-09 NOTE — Procedures (Signed)
Summary: Upper Endoscopy  Patient: Troy Palmer Note: All result statuses are Final unless otherwise noted.  Tests: (1) Upper Endoscopy (EGD)   EGD Upper Endoscopy       [facility].     [place], [place]  [id number]          ENDOSCOPY PROCEDURE REPORT          PATIENT:  Troy Palmer, Troy Palmer  MR#:  [id number]     BIRTHDATE:  13-Jan-1946, 64 yrs. old  GENDER:  male          ENDOSCOPIST:  Sandy Salaam. Deatra Ina, MD     Referred by:          PROCEDURE DATE:  10/30/2010     PROCEDURE:  EGD, diagnostic 43235, Maloney Dilation of Esophagus     ASA CLASS:  Class II     INDICATIONS:  dysphagia          MEDICATIONS:   There was residual sedation effect present from     prior procedure., Fentanyl 25 mcg IV, glycopyrrolate (Robinal) 0.2     mg IV, 0.6cc simethancone 0.6 cc PO     TOPICAL ANESTHETIC:  Exactacain Spray          DESCRIPTION OF PROCEDURE:   After the risks benefits and     alternatives of the procedure were thoroughly explained, informed     consent was obtained.  The LB [id number] endoscope was     introduced through the mouth and advanced to the third portion of     the duodenum, without limitations.  The instrument was slowly     withdrawn as the mucosa was fully examined.     <<PROCEDUREIMAGES>>          A stricture was found at the gastroesophageal junction (see     image1). Early esophageal stricture Dilation with maloney dilator     98mm Minimal resistance; no heme  Otherwise the examination was     normal (see image2, image3, image5, image4, image6, image7, and     image9).    Retroflexed views revealed no abnormalities.    The     scope was then withdrawn from the patient and the procedure     completed.          COMPLICATIONS:  None          ENDOSCOPIC IMPRESSION:     1) Stricture at the gastroesophageal junction - s/p maloney     dilitation     2) Otherwise normal examination     RECOMMENDATIONS:     1) dilatations  PRN          REPEAT EXAM:  No          ______________________________     Sandy Salaam. Deatra Ina, MD          CC:  Biagio Borg, MD          n.     Lorrin Mais:   Sandy Salaam. Kaplan at 10/30/2010 04:02 PM          [name], [id number]  Note: An exclamation mark (!) indicates a result that was not dispersed into the flowsheet. Document Creation Date: 10/30/2010 4:01 PM _______________________________________________________________________  (1) Order result status: Final Collection or observation date-time: 10/30/2010 15:50 Requested date-time:  Receipt date-time:  Reported date-time:  Referring Physician:   Ordering Physician: Erskine Emery [phone]) Specimen Source:  Source: Tawanna Cooler  Order Number: [id number] Lab site:

## 2010-12-29 NOTE — Procedures (Signed)
NAME:  Troy Palmer, Troy Palmer              ACCOUNT NO.:  [id number]   MEDICAL RECORD NO.:  [id number]          PATIENT TYPE:  OUT   LOCATION:  SLEEP CENTER                 FACILITY:  [facility]   PHYSICIAN:  Chesley Mires, M.D.      DATE OF BIRTH:  March 02, 1946   DATE OF STUDY:  11/12/2005                              NOCTURNAL POLYSOMNOGRAM   REFERRING PHYSICIAN:  Chesley Mires, M.D.   INDICATIONS FOR PROCEDURE:  This is an individual who presents with loud  snoring, witnessed apneas and excessive daytime sleepiness.  Presents for  evaluation of sleep disordered breathing.  Epworth sleep score is 5.   MEDICATIONS:  Flonase, allergy medicines and blood pressure medicines.   Sleep architecture:  Total recorded time was 362.5 minutes.  Total sleep  time was 237.5 minutes, sleep efficiency was 77%, which is reduced.  The  patient was observed in all stages of sleep although there was a relative  reduction in the percentage of slow wave sleep at 12% and REM sleep at 16%.  Sleep latency was 9 minutes, which is somewhat reduced.  REM latency was 117  minutes.  The patient was observed in both the supine and nonsupine  position.   Respiratory data:  The apnea hypopnea index was 28.3.  The events were  exclusively obstructive in nature.  Loud snoring was noted by the  technician.  Of note was that there was no significant positional or REM  effect.   Oxygen data:  The oxygen saturation nadir was 82%.  The patient spent a  total of 339.4 minutes with an oxygen saturation between 91 and 100% and  19.4 minutes with an oxygen saturation between 81 and 90%.   Cardiac data:  EKG showed normal sinus rhythm with occasional PVCs.   Movement of parasomnia:  The periodic limb movement index was 3.2.   IMPRESSION:  This study shows evidence for moderate obstructive sleep apnea  as demonstrated by an apnea/hypopnea index of 28.3 and oxygen saturation  nadir of 82%.  The patient should be counseled with regard to  points of  diet, exercise, and weight reduction as well as avoidance of alcohol and  sedatives.  Driving precautions should be discussed with the patient until  the sleep disorder is under adequate control.  The patient should be advised  to return to the sleep lab for CPAP titration study for further treatment of  his sleep apnea.     Chesley Mires, M.D.  Diplomat, Tax adviser of Sleep Medicine  Electronically Signed    VS/MEDQ  D:  11/27/2005 18:38:23  T:  11/28/2005 14:30:37  Job:  [id number]

## 2010-12-29 NOTE — Discharge Summary (Signed)
[facility]  Patient:    Troy Palmer, Troy Palmer                     MRN: [id number] Adm. Date:  [id number] Disc. Date: [id number] Attending:  Zigmund Gottron Dictator:   Dalene Seltzer. Roxy Manns, M.D. CC:         Joyice Faster. Mare Ferrari, M.D.                           Discharge Summary  SERVICE:  Family practice teaching service.  DISCHARGE DIAGNOSES: 1. Noncardiac chest pain. 2. Hypertension.  DISCHARGE MEDICATIONS: 1. Aspirin 81 mg p.o. q.d. 2. Lopressor 12.5 mg p.o. b.i.d. 3. Ibuprofen 200 mg two to three tablets q.4-6h. p.r.n. pain x 4 days.  ACTIVITY:  As tolerated.  DIET:  Recommended low fat, low cholesterol.  SPECIAL INSTRUCTIONS:  Patient was to return to the emergency room for chest pain or shortness of breath.  FOLLOW-UP APPOINTMENTS: 1. Blood pressure check at the family practice clinic on April 22, some time after    9:00 a.m. 2. Dr. Roxy Manns at the family practice clinic on April 20 at 11:00 a.m. 3. Dr. Mare Ferrari, April 11, for resting portion of the stress Cardiolite and, April    12, at 8:30 for the treadmill portion of this exam.  HISTORY OF PRESENT ILLNESS:  Patient is a 65 year old black male with onset of chest pain in the last week.  It has continued on and off but gradually worsening. Patient states it is a heavy, aching feeling that becomes sharp with movement, cough, or sneeze.  Sharp pains last momentarily but the heavy feeling lasts 10 o 15 minutes.  It is associated with shortness of breath, palpitations, radiation  down bilateral arms, with numbness, tingling in left fingers.  No diaphoresis, o nausea or vomiting.  Similar pain years ago with weight lifting and exercise but describes this differently.  At its worse, the pain is an 8/10 of 10. Currently, pain is a 3/10, not associated with food intake.  Occasional GERD symptoms with  spicy foods, positive shortness of breath and dyspnea on exertion over the  past  week.  He has also associated the pain with different positions.  Occasionally, it improves with walking around.  PAST MEDICAL HISTORY: 1. Hypertension diagnosed in 1998.  He was started on medications and discontinued    them himself.  He states that, since this time, he has not had high blood    pressure when he checks it at the drugstore. 2. Allergies versus chronic sinusitis. 3. He states he does not have diabetes mellitus.  He had his cholesterol checked in    1999 and it was "good." 4. In April, 1999, he had a negative colonoscopy.  PAST SURGICAL HISTORY:  Right knee replacement in 1990-91.  MEDICATIONS:  He takes Zyrtec b.i.d. and he also takes a medicine for chest congestion b.i.d.  ALLERGIES:  No known drug allergies.  SOCIAL HISTORY:  He lives alone.  He is the father of four children.  He is divorced x 2.  He works at ______ [employer].  No tobacco.  History of occasional  smoking 20 years ago.  No alcohol, history of marijuana use.  No IV drug use.  FAMILY HISTORY:  He had a brother who died at 57 of systemic lupus erythematosus. His mother died at 83 of a brain tumor.  She had hypertension.  His father died  at 55 of coronary artery disease.  No family history of diabetes mellitus or MI.  REVIEW OF SYSTEMS:  Please see HPI.  PHYSICAL EXAMINATION:  VITAL SIGNS:  Temperature 97.3, pulse 68, blood pressure 148/77, respirations 20, O2 saturating 95% on room air.  GENERAL:  He was well-nourished, well-developed black male, alert and oriented 4, in no apparent distress.  HEENT:  NCAT.  PERRLA.  EOMI.  Nares patent.  Positive sinus congestion.  TMs clear.  Oropharynx without erythema.  Good dentition.  NECK:  Supple.  No lymphadenopathy.  No JVD.  No thyromegaly.  No carotid bruits.  CHEST:  Nontender to deep palpation.  CV:  Regular rate and rhythm.  No murmurs, gallops, or rubs.  LUNGS:  Clear to auscultation bilaterally.  ABDOMEN:  Soft,  nontender, and nondistended.  Positive bowel sounds.  No hepatosplenomegaly.  EXTREMITIES:  2+ DP and PT pulses.  No clubbing, cyanosis, or edema.  RECTAL:  Showed diffuse prostate enlargement, positive brown stool in vault, heme negative.  NEUROLOGIC:  Exam was nonfocal.  LABORATORY DATA:  WBC 4.6, hemoglobin 14.3, hematocrit 41.6, platelets 277. Sodium 141, potassium 4.4, chloride 109, BUN 18, creatinine 1.2, glucose 108. Troponin I less than 0.03, CK 178, MB 2.6, index 1.5.  Chest x-ray showed no acute disease.  EKG was normal sinus rhythm, no acute changes.  Patient was admitted for rule-out MI.  HOSPITAL COURSE: #1 - CHEST PAIN:  Patient placed on aspirin and beta blocker.  Cardiac enzymes remained negative throughout hospitalization.  Repeat EKG showed bradycardia with a pulse of 55.  That was on Lopressor 25 mg b.i.d.  Patient had no symptoms with his bradycardia.  Also, blood pressure was 125/75.  At time of discharge, patient was very eager to leave the hospital.  He left, unfortunately, without receiving his discharge information and scheduled appointments.  Will continue to attempt to all patient with these pieces of information for him although, right now, his telephone has been disconnected.  #2 - HYPERTENSION:  Beta blockade led to bradycardia.  Will decrease this amount to 12.5 mg b.i.d.  Patient left the hospital without this prescription.  Will continue to call him to try and get this to him. DD:  11/02/99 TD:  11/02/99 Job: [id number]

## 2010-12-29 NOTE — H&P (Signed)
NAME:  Troy Palmer, Troy Palmer              ACCOUNT NO.:  [id number]   MEDICAL RECORD NO.:  [id number]          PATIENT TYPE:  INP   LOCATION:  0104                         FACILITY:  [facility]   PHYSICIAN:  Biagio Borg, M.D. LHCDATE OF BIRTH:  July 27, 1946   DATE OF ADMISSION:  06/12/2005  DATE OF DISCHARGE:                                HISTORY & PHYSICAL   CHIEF COMPLAINT:  Left chest pain for 6 hours.   HISTORY OF PRESENT ILLNESS:  Troy Palmer is a 65 year old black male with  sharp left chest pain, constant for at least 6 hours, radiates toward the  left scapula, not associated with shortness of breath, nausea, vomiting,  palpitations, diaphoresis, or exertion. No prior history. A friend of his  had a CABG 2 weeks ago. He is worried about his heart. There is a history of  several emergency room visits over the last 6 months for musculoskeletal  concerns. He is laid off from plant for 3 weeks and he remarks on doing a  lot of nothing at home.   PAST MEDICAL HISTORY:  Illnesses:  1.  Hypertension since 1998, off and on medications.  2.  Allergies/chronic sinusitis.  3.  Hypercholesterolemia with LDL 160 September 2006.  4.  DJD right knee status post right knee TKA in 1990.  5.  C spine DDD.  6.  Off-and-on medication compliance.   ALLERGIES:  No known drug allergies.   CURRENT MEDICATIONS:  1.  Norvasc 5 mg p.o. daily.  2.  Cholesterol pill.  3.  Claritin 10 mg p.o. daily p.r.n.   SOCIAL HISTORY:  No tobacco since occasional tobacco 20 years ago. No  alcohol. Divorced, four children.   FAMILY HISTORY:  Brother deceased at 80 with systemic lupus erythematosus.  Mother deceased at 2 with brain tumor and hypertension. Father deceased at  50 with coronary artery disease.   REVIEW OF SYSTEMS:  Otherwise noncontributory.   PHYSICAL EXAMINATION:  GENERAL:  Troy Palmer is a 64 year old black male who  looks depressed.  VITAL SIGNS:  Blood pressure 154/84, respirations 14, heart rate  70, O2  saturation 97%, he is afebrile.  ENT:  Sclerae clear, TMs clear, pharynx benign.  NECK:  No lymphadenopathy, JVD, or thyromegaly.  CHEST:  Clear to auscultation.  CARDIAC:  Regular rate and rhythm.  ABDOMEN:  Soft, nontender, positive bowel sounds.  EXTREMITIES:  No edema.   LABORATORY DATA:  ECG:  Sinus bradycardia with nonspecific ST-T wave  changes. CT angiogram of the chest:  No acute findings. CPK-MB 2.0, troponin  I less than 0.05. White blood cell count 4.1, hemoglobin 14.1. BMET normal.   ASSESSMENT AND PLAN:  1.  Chest pain, atypical, with multiple risk factors and history of      noncompliance. Cannot completely rule out cardiac versus musculoskeletal      and anxiety with stress. He is to be admitted, rule out MI with serial      cardiac enzymes. If negative, should have outpatient cardiac evaluation.      If positive, will need cardiac consultation.  2.  Hypertension. Increase Norvasc up  to 10 mg p.o. daily.  3.  Hypercholesterolemia. Encourage compliance. Not sure of exact      medication. For now, will start Zocor 20 mg.           ______________________________  Biagio Borg, M.D. [facility]     JWJ/MEDQ  D:  06/12/2005  T:  06/12/2005  Job:  [id number]

## 2010-12-29 NOTE — Discharge Summary (Signed)
NAME:  Troy Palmer, Troy Palmer              ACCOUNT NO.:  [id number]   MEDICAL RECORD NO.:  [id number]          PATIENT TYPE:  INP   LOCATION:  [id number]                         FACILITY:  [facility]   PHYSICIAN:  Gwendolyn Grant, M.D. LHCDATE OF BIRTH:  1946/01/25   DATE OF ADMISSION:  06/12/2005  DATE OF DISCHARGE:  06/13/2005                                 DISCHARGE SUMMARY   DISCHARGE DIAGNOSES:  1.  Atypical chest pain, rule out myocardial infarction negative. Telemetry      negative for outpatient Cardiolite cardiac stress testing; suspect      musculoskeletal in origin.  2.  Hypertension, suboptimal control. Increase Norvasc.  3.  Dyslipidemia.  4.  Seasonal allergies.  5.  History of benign prostatic hypertrophy.  6.  Obesity.  7.  Osteoarthritis, status post total knee replacement right knee 1990 and C-      spine degenerative disk disease.  8.  Question noncompliance.   DISCHARGE MEDICATIONS:  Include Norvasc 10 milligrams daily. Otherwise prior  to admission including Claritin 10 milligrams daily, lovastatin 40  milligrams daily and Flomax 0.4 milligrams daily.   The patient will be seen in follow-up by primary M.D. Dr. Willaim Bane in the  next week after having a Cardiolite stress test in the next couple of days  to be arranged prior to discharge. To complete risk stratification in this  patient with multiple risk factors for coronary disease.   CONDITION ON DISCHARGE:  Medically stable, still with mild ache and soreness  in the back greater than chest but tolerating p.o. and he feels improved  from admission.   HOSPITAL COURSE:  ATYPICAL CHEST PAIN:  The patient is a pleasant 65-year-  old gentleman with the above-listed problems who came to the emergency room  for evaluation of nagging ache and discomfort in his lower chest, side and  back on and off over the last few months; most recently over the last six  hours. Because of a friend of his has recently had cardiac bypass two  weeks  ago, he was worried about his heart and therefore came to the emergency room  for further evaluation. Because of his multiple risk factors for coronary  disease, he was admitted for telemetry observation and to rule out with  cardiac enzymes both of which were fortunately negative. The patient was  relieved of his negative workup and agrees to be more compliant with his  home medications for hypertension. Also explained to the patient that we  will arrange for further stress testing with cardiology in the next few days  prior to discharge so that we may complete risk stratification to better  quantify his risk for coronary disease. The patient is agreeable with these  plans and will see his primary M.D. next week for follow-up. He is reminded  to return to the emergency room should he have any change in the nature of  this pain or further concerns persist.     Gwendolyn Grant, M.D. [facility]  Electronically Signed    VL/MEDQ  D:  06/13/2005  T:  06/14/2005  Job:  [id number]

## 2011-02-12 ENCOUNTER — Other Ambulatory Visit (INDEPENDENT_AMBULATORY_CARE_PROVIDER_SITE_OTHER): Payer: PRIVATE HEALTH INSURANCE

## 2011-02-12 ENCOUNTER — Ambulatory Visit (INDEPENDENT_AMBULATORY_CARE_PROVIDER_SITE_OTHER): Payer: PRIVATE HEALTH INSURANCE | Admitting: Internal Medicine

## 2011-02-12 ENCOUNTER — Encounter: Payer: Self-pay | Admitting: Internal Medicine

## 2011-02-12 DIAGNOSIS — R3919 Other difficulties with micturition: Secondary | ICD-10-CM

## 2011-02-12 DIAGNOSIS — I10 Essential (primary) hypertension: Secondary | ICD-10-CM

## 2011-02-12 DIAGNOSIS — R39198 Other difficulties with micturition: Secondary | ICD-10-CM

## 2011-02-12 DIAGNOSIS — Z0000 Encounter for general adult medical examination without abnormal findings: Secondary | ICD-10-CM

## 2011-02-12 DIAGNOSIS — J45909 Unspecified asthma, uncomplicated: Secondary | ICD-10-CM

## 2011-02-12 DIAGNOSIS — M549 Dorsalgia, unspecified: Secondary | ICD-10-CM

## 2011-02-12 LAB — URINALYSIS, ROUTINE W REFLEX MICROSCOPIC
Ketones, ur: NEGATIVE
Leukocytes, UA: NEGATIVE
Nitrite: NEGATIVE
Specific Gravity, Urine: 1.02 (ref 1.000–1.030)
pH: 6 (ref 5.0–8.0)

## 2011-02-12 NOTE — Patient Instructions (Signed)
Continue all other medications as before You can also take Senakot OTC (1 at night) for the constipation Please go to LAB in the Basement for the blood and/or urine tests to be done today Please call the phone number [phone] (the [place]) for results of testing in 2-3 days;  When calling, simply dial the number, and when prompted enter the MRN number above (the Medical Record Number) and the # key, then the message should start. Marland Kitchen

## 2011-02-12 NOTE — Assessment & Plan Note (Signed)
C/w mild right sciatica, overall stable, chrnoic recurrent - The current medical regimen is effective;  continue present plan and medications.

## 2011-02-12 NOTE — Progress Notes (Signed)
Quick Note:  Voice message left on [employer] system - lab is negative, normal or otherwise stable, pt to continue same tx ______ 

## 2011-02-14 ENCOUNTER — Encounter: Payer: Self-pay | Admitting: Internal Medicine

## 2011-02-14 DIAGNOSIS — Z0001 Encounter for general adult medical examination with abnormal findings: Secondary | ICD-10-CM | POA: Insufficient documentation

## 2011-02-14 NOTE — Progress Notes (Signed)
Subjective:    Patient ID: Troy Palmer, male    DOB: [birth year], 65 y.o.   MRN: [id number]  HPI Pt continues to have recurring right LBP without change in severity, bowel  change, fever, wt loss,  worsening LE pain/numbness/weakness, gait change or falls, but has had some incidental mild discomfort with urination for the past wk, without abd pain, n/v, f/c, and  Denies urinary symptoms such as  frequency, urgency,or hematuria. Pt denies chest pain, increased sob or doe, wheezing, orthopnea, PND, increased LE swelling, palpitations, dizziness or syncope.  Pt denies new neurological symptoms such as new headache, or facial or extremity weakness or numbness   Pt denies polydipsia, polyuria. Past Medical History  Diagnosis Date  . TB SKIN TEST, POSITIVE 03/28/2007  . SLEEP APNEA, OBSTRUCTIVE, MODERATE 04/09/2007  . RENAL INSUFFICIENCY 03/15/2010  . PVD WITH CLAUDICATION 02/10/2010  . Polymyalgia rheumatica 07/26/2008  . POLYARTHRALGIA 07/13/2008  . Other dysphagia 11/21/2009  . Osteoarth NOS-Unspec 03/28/2007  . LUNG NODULE 12/04/2007  . LOW BACK PAIN 04/09/2007  . LEG PAIN, BILATERAL 01/25/2010  . HYPERTENSION 03/28/2007  . HYPERLIPIDEMIA 12/04/2007  . HEMORRHOIDS, RECURRENT 02/11/2008  . GERD 12/04/2007  . ESOPHAGEAL STRICTURE 05/26/2008  . ERECTILE DYSFUNCTION 04/09/2007  . DYSPHAGIA UNSPECIFIED 03/16/2008  . DIVERTICULOSIS, COLON 12/04/2007  . DEPRESSION 12/04/2007  . Degeneration of cervical intervertebral disc 03/28/2007  . COLONIC POLYPS, HX OF 12/04/2007  . CONSTIPATION 09/25/2010  . CEREBROVASCULAR ACCIDENT, HX OF 07/17/2010  . BENIGN PROSTATIC HYPERTROPHY 04/09/2007  . BACK PAIN 09/16/2009  . ASTHMA 12/04/2007  . ALLERGIC RHINITIS 04/09/2007   Past Surgical History  Procedure Date  . Total knee arthroplasty     x 2  . Rotator cuff repair     Left    reports that he has quit smoking. He does not have any smokeless tobacco history on file. He reports that he does not drink alcohol or use  illicit drugs. family history includes Asthma in his other; Heart disease in his father; Hypertension in his brother; and Lupus in his brother. No Known Allergies Current Outpatient Prescriptions on File Prior to Visit  Medication Sig Dispense Refill  . amLODipine (NORVASC) 5 MG tablet Take 5 mg by mouth daily.        Marland Kitchen aspirin 81 MG EC tablet Take 81 mg by mouth daily.        . cetirizine (ZYRTEC) 10 MG tablet Take 10 mg by mouth daily.        Marland Kitchen dexlansoprazole (DEXILANT) 60 MG capsule Take 60 mg by mouth daily.        . fexofenadine (ALLEGRA) 180 MG tablet Take 180 mg by mouth daily.        . Glycerin, Laxative, 5.6 GM/DOSE ENEM Place rectally. 1 per rectum every 6 hours as needed for constipation       . hydrochlorothiazide (,MICROZIDE/HYDRODIURIL,) 12.5 MG capsule Take 12.5 mg by mouth daily.        Marland Kitchen lactulose (CHRONULAC) 10 GM/15ML solution Take 20 g by mouth 2 (two) times daily.        Marland Kitchen lovastatin (MEVACOR) 40 MG tablet Take 40 mg by mouth daily.        Marland Kitchen omeprazole (PRILOSEC) 20 MG capsule Take 20 mg by mouth 2 (two) times daily.        . Silodosin (RAPAFLO) 8 MG CAPS Take by mouth daily.        Marland Kitchen triamcinolone (NASACORT) 55 MCG/ACT nasal inhaler 2 sprays by Nasal  route daily.         Review of Systems Review of Systems  Constitutional: Negative for diaphoresis and unexpected weight change.  HENT: Negative for drooling and tinnitus.   Eyes: Negative for photophobia and visual disturbance.  Respiratory: Negative for choking and stridor.   Gastrointestinal: Negative for vomiting and blood in stool.  Genitourinary: Negative for hematuria and decreased urine volume.       Objective:   Physical Exam BP 158/82  Pulse 60  Temp(Src) 97.7 F (36.5 C) (Oral)  Resp 16  Wt 278 lb (126.1 kg)  SpO2 99% Physical Exam  VS noted, not ill appearing Constitutional: Pt appears well-developed and well-nourished.  HENT: Head: Normocephalic.  Right Ear: External ear normal.  Left Ear:  External ear normal.  Eyes: Conjunctivae and EOM are normal. Pupils are equal, round, and reactive to light.  Neck: Normal range of motion. Neck supple.  Cardiovascular: Normal rate and regular rhythm.   Pulmonary/Chest: Effort normal and breath sounds normal.  Abd:  Soft, NT, non-distended, + BS Neurological: Pt is alert. No cranial nerve deficit.  Skin: Skin is warm. No erythema.  Psychiatric: Pt behavior is normal. Thought content normal.  Spine nontender, no swelling, erythema, rash       Assessment & Plan:

## 2011-02-14 NOTE — Assessment & Plan Note (Signed)
stable overall by hx and exam, most recent data reviewed with pt, and pt to continue medical treatment as before, mild elev today likely situatianal  BP Readings from Last 3 Encounters:  02/12/11 158/82  11/08/10 138/78  10/25/10 160/90

## 2011-02-14 NOTE — Assessment & Plan Note (Signed)
?   Clinical significance - for UA today, tx pending results

## 2011-02-14 NOTE — Assessment & Plan Note (Signed)
stable overall by hx and exam, most recent data reviewed with pt, and pt to continue medical treatment as before SpO2 Readings from Last 3 Encounters:  02/12/11 99%  11/08/10 97%  10/10/10 97%

## 2011-03-16 ENCOUNTER — Other Ambulatory Visit: Payer: Self-pay | Admitting: Internal Medicine

## 2011-04-11 ENCOUNTER — Encounter: Payer: Self-pay | Admitting: Internal Medicine

## 2011-04-11 ENCOUNTER — Ambulatory Visit (INDEPENDENT_AMBULATORY_CARE_PROVIDER_SITE_OTHER)
Admission: RE | Admit: 2011-04-11 | Discharge: 2011-04-11 | Disposition: A | Discharge status: Home / Self Care | Payer: PRIVATE HEALTH INSURANCE | Source: Ambulatory Visit | Attending: Internal Medicine | Admitting: Internal Medicine

## 2011-04-11 ENCOUNTER — Ambulatory Visit (INDEPENDENT_AMBULATORY_CARE_PROVIDER_SITE_OTHER): Payer: PRIVATE HEALTH INSURANCE | Admitting: Internal Medicine

## 2011-04-11 DIAGNOSIS — I10 Essential (primary) hypertension: Secondary | ICD-10-CM

## 2011-04-11 DIAGNOSIS — E785 Hyperlipidemia, unspecified: Secondary | ICD-10-CM

## 2011-04-11 DIAGNOSIS — R079 Chest pain, unspecified: Secondary | ICD-10-CM

## 2011-04-11 DIAGNOSIS — Z0000 Encounter for general adult medical examination without abnormal findings: Secondary | ICD-10-CM

## 2011-04-11 MED ORDER — ATORVASTATIN CALCIUM 40 MG PO TABS: 40.0000 mg | ORAL_TABLET | Freq: Every day | ORAL | Status: DC

## 2011-04-11 NOTE — Assessment & Plan Note (Signed)
stable overall by hx and exam, most recent data reviewed with pt, and pt to continue medical treatment as before  BP Readings from Last 3 Encounters:  04/11/11 132/72  02/12/11 158/82  11/08/10 138/78

## 2011-04-11 NOTE — Assessment & Plan Note (Addendum)
stable overall by hx and exam, most recent data reviewed with pt, and pt to continue medical treatment as before  Lab Results  Component Value Date   LDLCALC 120* 11/08/2010   For change of lovastatin to lipitor for better affordable control, goal ldl < 70

## 2011-04-11 NOTE — Patient Instructions (Addendum)
Your EKG was OK today Please stop the lovastatin (mevacor) Start the generic for lipitor at 40 mg per day Continue all other medications as before Please go to XRAY in the Basement for the x-ray test You will be contacted regarding the referral for: stress test Please return in 6 mo with Lab testing done 3-5 days before, or sooner if needed

## 2011-04-11 NOTE — Progress Notes (Signed)
Subjective:    Patient ID: Troy Palmer, male    DOB: August 04, 1946, 65 y.o.   MRN: [id number]  HPI  Here to f/u - .Here to f/u; overall doing ok,  Pt denies increased sob or doe, wheezing, orthopnea, PND, increased LE swelling, palpitations, dizziness or syncope.  Pt denies new neurological symptoms such as new headache, or facial or extremity weakness or numbness   Pt denies polydipsia, polyuria, or low sugar symptoms such as weakness or confusion improved with po intake.  Pt states overall good compliance with meds, trying to follow lower cholesterol diet, wt overall stable but little exercise however. Does also c/o left CP intermittent without n/v, diaphoresis, palp's, dizziness, nonexertional, nonpleuritic for 2 wks.  Hx somewhat limited due to memory dysfxn. Past Medical History  Diagnosis Date  . TB SKIN TEST, POSITIVE 03/28/2007  . SLEEP APNEA, OBSTRUCTIVE, MODERATE 04/09/2007  . RENAL INSUFFICIENCY 03/15/2010  . PVD WITH CLAUDICATION 02/10/2010  . Polymyalgia rheumatica 07/26/2008  . POLYARTHRALGIA 07/13/2008  . Other dysphagia 11/21/2009  . Osteoarth NOS-Unspec 03/28/2007  . LUNG NODULE 12/04/2007  . LOW BACK PAIN 04/09/2007  . LEG PAIN, BILATERAL 01/25/2010  . HYPERTENSION 03/28/2007  . HYPERLIPIDEMIA 12/04/2007  . HEMORRHOIDS, RECURRENT 02/11/2008  . GERD 12/04/2007  . ESOPHAGEAL STRICTURE 05/26/2008  . ERECTILE DYSFUNCTION 04/09/2007  . DYSPHAGIA UNSPECIFIED 03/16/2008  . DIVERTICULOSIS, COLON 12/04/2007  . DEPRESSION 12/04/2007  . Degeneration of cervical intervertebral disc 03/28/2007  . COLONIC POLYPS, HX OF 12/04/2007  . CONSTIPATION 09/25/2010  . CEREBROVASCULAR ACCIDENT, HX OF 07/17/2010  . BENIGN PROSTATIC HYPERTROPHY 04/09/2007  . BACK PAIN 09/16/2009  . ASTHMA 12/04/2007  . ALLERGIC RHINITIS 04/09/2007   Past Surgical History  Procedure Date  . Total knee arthroplasty     x 2  . Rotator cuff repair     Left    reports that he has quit smoking. He does not have any smokeless  tobacco history on file. He reports that he does not drink alcohol or use illicit drugs. family history includes Asthma in his other; Heart disease in his father; Hypertension in his brother; and Lupus in his brother. No Known Allergies Current Outpatient Prescriptions on File Prior to Visit  Medication Sig Dispense Refill  . amLODipine (NORVASC) 5 MG tablet take 1 tablet by mouth once daily  90 tablet  3  . aspirin 81 MG EC tablet Take 81 mg by mouth daily.        . hydrochlorothiazide (,MICROZIDE/HYDRODIURIL,) 12.5 MG capsule take 1 capsule by mouth once daily  90 capsule  3  . Tamsulosin HCl (FLOMAX) 0.4 MG CAPS take 1 capsule by mouth once daily  90 capsule  3  . cetirizine (ZYRTEC) 10 MG tablet Take 10 mg by mouth daily.        Marland Kitchen dexlansoprazole (DEXILANT) 60 MG capsule Take 60 mg by mouth daily.        . fexofenadine (ALLEGRA) 180 MG tablet Take 180 mg by mouth daily.        . Glycerin, Laxative, 5.6 GM/DOSE ENEM Place rectally. 1 per rectum every 6 hours as needed for constipation       . lactulose (CHRONULAC) 10 GM/15ML solution Take 20 g by mouth 2 (two) times daily.        Marland Kitchen omeprazole (PRILOSEC) 20 MG capsule Take 20 mg by mouth 2 (two) times daily.        . Silodosin (RAPAFLO) 8 MG CAPS Take by mouth daily.        Marland Kitchen  triamcinolone (NASACORT) 55 MCG/ACT nasal inhaler 2 sprays by Nasal route daily.         Review of Systems Review of Systems  Constitutional: Negative for diaphoresis and unexpected weight change.  HENT: Negative for drooling and tinnitus.   Eyes: Negative for photophobia and visual disturbance.  Respiratory: Negative for choking and stridor.   Gastrointestinal: Negative for vomiting and blood in stool.  Genitourinary: Negative for hematuria and decreased urine volume.  Musculoskeletal: Negative for gait problem.  Skin: Negative for color change and wound.  Neurological: Negative for tremors and numbness.  Psychiatric/Behavioral: Negative for decreased  concentration. The patient is not hyperactive.       Objective:   Physical Exam BP 132/72  Pulse 57  Temp(Src) 97.6 F (36.4 C) (Oral)  Ht 5' 9.5" (1.765 m)  Wt 272 lb (123.378 kg)  BMI 39.59 kg/m2  SpO2 97% Physical Exam  VS noted Constitutional: Pt appears well-developed and well-nourished.  HENT: Head: Normocephalic.  Right Ear: External ear normal.  Left Ear: External ear normal.  Eyes: Conjunctivae and EOM are normal. Pupils are equal, round, and reactive to light.  Neck: Normal range of motion. Neck supple.  Cardiovascular: Normal rate and regular rhythm.   Pulmonary/Chest: Effort normal and breath sounds normal.  Abd:  Soft, NT, non-distended, + BS Neurological: Pt is alert. No cranial nerve deficit.  Skin: Skin is warm. No erythema.  Psychiatric: Pt behavior is normal. Thought content ok but with memory defecit chronic      Assessment & Plan:

## 2011-04-11 NOTE — Assessment & Plan Note (Signed)
Atypical, ecg reviewed - sinus without acute change;  For stress test, and cxr today

## 2011-04-16 ENCOUNTER — Other Ambulatory Visit: Payer: Self-pay | Admitting: Physician Assistant

## 2011-04-24 ENCOUNTER — Encounter: Payer: Self-pay | Admitting: Internal Medicine

## 2011-05-03 ENCOUNTER — Encounter (HOSPITAL_COMMUNITY): Payer: PRIVATE HEALTH INSURANCE | Admitting: Radiology

## 2011-05-04 LAB — COMPREHENSIVE METABOLIC PANEL
ALT: 21
Alkaline Phosphatase: 87
CO2: 26
Chloride: 109
GFR calc non Af Amer: 32 — ABNORMAL LOW
Glucose, Bld: 87
Potassium: 4.1
Sodium: 141
Total Bilirubin: 0.5

## 2011-05-04 LAB — DIFFERENTIAL
Basophils Relative: 0
Eosinophils Absolute: 0.2
Monocytes Relative: 18 — ABNORMAL HIGH
Neutrophils Relative %: 52

## 2011-05-04 LAB — CBC
Hemoglobin: 12.6 — ABNORMAL LOW
RBC: 4.49

## 2011-05-08 ENCOUNTER — Telehealth: Payer: Self-pay

## 2011-05-08 ENCOUNTER — Other Ambulatory Visit: Payer: Self-pay | Admitting: Internal Medicine

## 2011-05-08 DIAGNOSIS — R079 Chest pain, unspecified: Secondary | ICD-10-CM

## 2011-05-08 NOTE — Telephone Encounter (Signed)
Called the patient left message on voice mail to call back.

## 2011-05-08 NOTE — Telephone Encounter (Signed)
Message copied by Jamesetta Orleans on Tue May 08, 2011  9:29 AM ------      Message from: Biagio Borg      Created: Mon May 07, 2011  5:16 PM      Regarding: missed stress test       Please call pt to see if he would want to re-schedule his stress test      ----- Message -----         From: Odis Hollingshead, RN         Sent: 05/03/2011  11:09 AM           To: Cathlean Cower, MD            The patient was a no show today for a Rest/Stress Myoview. Unable to reach him on his answer machine due to mail box full. The notes on appointment desk attempted to call him several times.Patsy Edwards,RN

## 2011-05-08 NOTE — Telephone Encounter (Signed)
Message copied by Jamesetta Orleans on Tue May 08, 2011  9:12 AM ------      Message from: Biagio Borg      Created: Mon May 07, 2011  5:16 PM      Regarding: missed stress test       Please call pt to see if he would want to re-schedule his stress test      ----- Message -----         From: Odis Hollingshead, RN         Sent: 05/03/2011  11:09 AM           To: Cathlean Cower, MD            The patient was a no show today for a Rest/Stress Myoview. Unable to reach him on his answer machine due to mail box full. The notes on appointment desk attempted to call him several times.Patsy Edwards,RN

## 2011-05-08 NOTE — Telephone Encounter (Signed)
Called the patient and he would like to reschedule his stress test. The patient is requesting the stress test be scheduled around May 18, 2011 and in the afternoon.

## 2011-05-08 NOTE — Telephone Encounter (Signed)
I have re-ordered the test, with his preference noted

## 2011-05-25 LAB — DIFFERENTIAL
Basophils Absolute: 0
Basophils Relative: 1
Monocytes Relative: 16 — ABNORMAL HIGH
Neutro Abs: 3.1
Neutrophils Relative %: 60

## 2011-05-25 LAB — BASIC METABOLIC PANEL
CO2: 27
Calcium: 8.5
Creatinine, Ser: 1.16
GFR calc Af Amer: >60

## 2011-05-25 LAB — CBC
MCHC: 33
RBC: 5.02

## 2011-05-25 LAB — B-NATRIURETIC PEPTIDE (CONVERTED LAB): Pro B Natriuretic peptide (BNP): 65.3

## 2011-05-25 LAB — POCT CARDIAC MARKERS
CKMB, poc: 1.1
Myoglobin, poc: 79.2
Operator id: 4661

## 2011-05-30 ENCOUNTER — Emergency Department (HOSPITAL_COMMUNITY)
Admission: EM | Admit: 2011-05-30 | Discharge: 2011-05-30 | Disposition: A | Discharge status: Home / Self Care | Payer: PRIVATE HEALTH INSURANCE | Attending: Emergency Medicine | Admitting: Emergency Medicine

## 2011-05-30 DIAGNOSIS — I10 Essential (primary) hypertension: Secondary | ICD-10-CM | POA: Insufficient documentation

## 2011-05-30 DIAGNOSIS — M109 Gout, unspecified: Secondary | ICD-10-CM | POA: Insufficient documentation

## 2011-05-30 DIAGNOSIS — E7800 Pure hypercholesterolemia, unspecified: Secondary | ICD-10-CM | POA: Insufficient documentation

## 2011-05-30 DIAGNOSIS — M79609 Pain in unspecified limb: Secondary | ICD-10-CM | POA: Insufficient documentation

## 2011-06-04 ENCOUNTER — Ambulatory Visit: Payer: PRIVATE HEALTH INSURANCE | Admitting: Internal Medicine

## 2011-06-07 ENCOUNTER — Encounter: Payer: Self-pay | Admitting: Internal Medicine

## 2011-06-07 ENCOUNTER — Other Ambulatory Visit (INDEPENDENT_AMBULATORY_CARE_PROVIDER_SITE_OTHER): Payer: PRIVATE HEALTH INSURANCE

## 2011-06-07 ENCOUNTER — Ambulatory Visit (INDEPENDENT_AMBULATORY_CARE_PROVIDER_SITE_OTHER): Payer: PRIVATE HEALTH INSURANCE | Admitting: Internal Medicine

## 2011-06-07 DIAGNOSIS — M109 Gout, unspecified: Secondary | ICD-10-CM

## 2011-06-07 DIAGNOSIS — E785 Hyperlipidemia, unspecified: Secondary | ICD-10-CM

## 2011-06-07 DIAGNOSIS — N184 Chronic kidney disease, stage 4 (severe): Secondary | ICD-10-CM | POA: Insufficient documentation

## 2011-06-07 DIAGNOSIS — I10 Essential (primary) hypertension: Secondary | ICD-10-CM

## 2011-06-07 DIAGNOSIS — N529 Male erectile dysfunction, unspecified: Secondary | ICD-10-CM

## 2011-06-07 DIAGNOSIS — N189 Chronic kidney disease, unspecified: Secondary | ICD-10-CM

## 2011-06-07 DIAGNOSIS — N183 Chronic kidney disease, stage 3 unspecified: Secondary | ICD-10-CM | POA: Insufficient documentation

## 2011-06-07 LAB — BASIC METABOLIC PANEL
BUN: 16 mg/dL (ref 6–23)
Calcium: 8.7 mg/dL (ref 8.4–10.5)
Creatinine, Ser: 1.6 mg/dL — ABNORMAL HIGH (ref 0.4–1.5)
GFR: 56.49 mL/min — ABNORMAL LOW (ref >60.00)

## 2011-06-07 LAB — LIPID PANEL: Cholesterol: 152 mg/dL (ref 0–200)

## 2011-06-07 MED ORDER — LISINOPRIL 20 MG PO TABS
20.0000 mg | ORAL_TABLET | Freq: Every day | ORAL | Status: DC
Start: 2011-06-07 — End: 2011-09-22

## 2011-06-07 NOTE — Assessment & Plan Note (Addendum)
stable overall by hx and exam, most recent data reviewed with pt, and pt to continue medical treatment as before  Lab Results  Component Value Date   LDLCALC 120* 11/08/2010   Goal ldl < 70, to  Check lab today

## 2011-06-07 NOTE — Patient Instructions (Addendum)
Ok to stop the fluid pill Start the lisinopril 20 mg per day Continue all other medications as before Please go to LAB in the Basement for the blood and/or urine tests to be done today - to include the testosterone and uric acid Please call the phone number [phone] (the [facility]) for results of testing in 2-3 days;  When calling, simply dial the number, and when prompted enter the MRN number above (the Medical Record Number) and the # key, then the message should start. Please return in Feb 2013 with Lab testing done 3-5 days before

## 2011-06-07 NOTE — Assessment & Plan Note (Signed)
stable overall by hx and exam, most recent data reviewed with pt, and pt to continue medical treatment as before  Lab Results  Component Value Date   WBC 3.6* 10/10/2010   HGB 14.1 10/10/2010   HCT 41.7 10/10/2010   PLT 265.0 10/10/2010   GLUCOSE 86 10/10/2010   CHOL 189 11/08/2010   TRIG 55.0 11/08/2010   HDL 58.30 11/08/2010   LDLDIRECT 156.9 10/10/2010   LDLCALC 120* 11/08/2010   ALT 14 11/08/2010   AST 18 11/08/2010   NA 139 10/10/2010   K 4.2 10/10/2010   CL 103 10/10/2010   CREATININE 1.6* 10/10/2010   BUN 10 10/10/2010   CO2 30 10/10/2010   TSH 1.56 10/25/2010   PSA 0.78 10/10/2010   To check labs today

## 2011-06-07 NOTE — Assessment & Plan Note (Signed)
?   Due to hctz vs other - to check testosterone today  to f/u any worsening symptoms or concerns,  Declines viagra or other today

## 2011-06-07 NOTE — Assessment & Plan Note (Addendum)
Ok to change the hctz back to ACE, for hopeful renalprotection, as well better BP control overall, and even possible improved ED and less chance of recurrent gout? BP Readings from Last 3 Encounters:  06/07/11 148/68  04/11/11 132/72  02/12/11 158/82

## 2011-06-07 NOTE — Progress Notes (Signed)
Subjective:    Patient ID: Troy Palmer, male    DOB: 1946-03-20, 65 y.o.   MRN: [id number]  HPI Here to f/u recent ER eval with ankle and feet pain, dx and tx per pt for gouty arthritis flare, new for him, asked then to f/u here, consider change of BP med to avoid further attacks.  Pt denies chest pain, increased sob or doe, wheezing, orthopnea, PND, increased LE swelling, palpitations, dizziness or syncope.  Pt denies new neurological symptoms such as new headache, or facial or extremity weakness or numbness   Pt denies polydipsia, polyuria, or low sugar symptoms such as weakness or confusion improved with po intake.  Pt states overall good compliance with meds, trying to follow lower cholesterol diet, wt overall stable but little exercise however.    Pt denies fever, wt loss, night sweats, loss of appetite, or other constitutional symptoms  Overall good compliance with treatment, and good medicine tolerability, except had to stop the nasacort due to nasal burning discomfort.  Does c/o worseningED symptoms as well in the past 6-12 months, though desire is intact.l  Cialis in the past has been expensive. Past Medical History  Diagnosis Date  . TB SKIN TEST, POSITIVE 03/28/2007  . SLEEP APNEA, OBSTRUCTIVE, MODERATE 04/09/2007  . RENAL INSUFFICIENCY 03/15/2010  . PVD WITH CLAUDICATION 02/10/2010  . Polymyalgia rheumatica 07/26/2008  . POLYARTHRALGIA 07/13/2008  . Other dysphagia 11/21/2009  . Osteoarth NOS-Unspec 03/28/2007  . LUNG NODULE 12/04/2007  . LOW BACK PAIN 04/09/2007  . LEG PAIN, BILATERAL 01/25/2010  . HYPERTENSION 03/28/2007  . HYPERLIPIDEMIA 12/04/2007  . HEMORRHOIDS, RECURRENT 02/11/2008  . GERD 12/04/2007  . ESOPHAGEAL STRICTURE 05/26/2008  . ERECTILE DYSFUNCTION 04/09/2007  . DYSPHAGIA UNSPECIFIED 03/16/2008  . DIVERTICULOSIS, COLON 12/04/2007  . DEPRESSION 12/04/2007  . Degeneration of cervical intervertebral disc 03/28/2007  . COLONIC POLYPS, HX OF 12/04/2007  . CONSTIPATION 09/25/2010  .  CEREBROVASCULAR ACCIDENT, HX OF 07/17/2010  . BENIGN PROSTATIC HYPERTROPHY 04/09/2007  . BACK PAIN 09/16/2009  . ASTHMA 12/04/2007  . ALLERGIC RHINITIS 04/09/2007   Past Surgical History  Procedure Date  . Total knee arthroplasty     x 2  . Rotator cuff repair     Left    reports that he has quit smoking. He does not have any smokeless tobacco history on file. He reports that he does not drink alcohol or use illicit drugs. family history includes Asthma in his other; Heart disease in his father; Hypertension in his brother; and Lupus in his brother. No Known Allergies Current Outpatient Prescriptions on File Prior to Visit  Medication Sig Dispense Refill  . amLODipine (NORVASC) 5 MG tablet take 1 tablet by mouth once daily  90 tablet  3  . aspirin 81 MG EC tablet Take 81 mg by mouth daily.        Marland Kitchen atorvastatin (LIPITOR) 40 MG tablet Take 1 tablet (40 mg total) by mouth daily.  90 tablet  3  . cetirizine (ZYRTEC) 10 MG tablet Take 10 mg by mouth daily.        . fexofenadine (ALLEGRA) 180 MG tablet Take 180 mg by mouth daily.        . Glycerin, Laxative, 5.6 GM/DOSE ENEM Place rectally. 1 per rectum every 6 hours as needed for constipation       . lactulose (CHRONULAC) 10 GM/15ML solution TAKE 30 MILLITERS (2 TABLESPOONSFUL) BY MOUTH 1 TO 2 TIMES DAILY FOR CONSTIPATION  1419 mL  0  . Tamsulosin  HCl (FLOMAX) 0.4 MG CAPS take 1 capsule by mouth once daily  90 capsule  3   Review of Systems Review of Systems  Constitutional: Negative for diaphoresis and unexpected weight change.  HENT: Negative for drooling and tinnitus.   Eyes: Negative for photophobia and visual disturbance.  Respiratory: Negative for choking and stridor.   Gastrointestinal: Negative for vomiting and blood in stool.  Genitourinary: Negative for hematuria and decreased urine volume.    Objective:   Physical Exam BP 148/68  Pulse 62  Temp(Src) 97.4 F (36.3 C) (Oral)  Ht 5' 9.5" (1.765 m)  Wt 275 lb 8 oz (124.966 kg)   BMI 40.10 kg/m2  SpO2 96% Physical Exam  VS noted Constitutional: Pt appears well-developed and well-nourished.  HENT: Head: Normocephalic.  Right Ear: External ear normal.  Left Ear: External ear normal.  Eyes: Conjunctivae and EOM are normal. Pupils are equal, round, and reactive to light.  Neck: Normal range of motion. Neck supple.  Cardiovascular: Normal rate and regular rhythm.   Pulmonary/Chest: Effort normal and breath sounds normal.  Abd:  Soft, NT, non-distended, + BS Neurological: Pt is alert. No cranial nerve deficit.  Skin: Skin is warm. No erythema.  Psychiatric: Pt behavior is normal. Thought content normal.  No synovitis or acute gout arthritits today to feet and ankles       Assessment & Plan:

## 2011-06-07 NOTE — Assessment & Plan Note (Signed)
Improved since ER visit - for uric acid

## 2011-06-08 LAB — TESTOSTERONE, FREE, TOTAL, SHBG
Sex Hormone Binding: 45 nmol/L (ref 13–71)
Testosterone-% Free: 1.5 % — ABNORMAL LOW (ref 1.6–2.9)
Testosterone: 143.67 ng/dL — ABNORMAL LOW (ref 250–890)

## 2011-06-20 ENCOUNTER — Ambulatory Visit (INDEPENDENT_AMBULATORY_CARE_PROVIDER_SITE_OTHER): Payer: PRIVATE HEALTH INSURANCE | Admitting: Internal Medicine

## 2011-06-20 ENCOUNTER — Encounter: Payer: Self-pay | Admitting: Internal Medicine

## 2011-06-20 VITALS — BP 108/62 | HR 71 | Temp 98.3°F | Ht 70.0 in | Wt 274.0 lb

## 2011-06-20 DIAGNOSIS — J019 Acute sinusitis, unspecified: Secondary | ICD-10-CM | POA: Insufficient documentation

## 2011-06-20 DIAGNOSIS — I10 Essential (primary) hypertension: Secondary | ICD-10-CM

## 2011-06-20 DIAGNOSIS — F329 Major depressive disorder, single episode, unspecified: Secondary | ICD-10-CM

## 2011-06-20 MED ORDER — LEVOFLOXACIN 500 MG PO TABS: 500.0000 mg | ORAL_TABLET | Freq: Every day | ORAL | Status: DC

## 2011-06-20 NOTE — Assessment & Plan Note (Signed)
Mild to mod, for antibx course,  to f/u any worsening symptoms or concerns 

## 2011-06-20 NOTE — Patient Instructions (Signed)
Take all new medications as prescribed - the antibiotic You can also take Delsym OTC for cough, and/or Mucinex (or it's generic off brand) for congestion OK to stay off the norvasc 5 mg Blood pressure medication Continue all other medications as before

## 2011-06-24 ENCOUNTER — Encounter: Payer: Self-pay | Admitting: Internal Medicine

## 2011-06-24 NOTE — Progress Notes (Signed)
Subjective:    Patient ID: Troy Palmer, male    DOB: [birth year], 65 y.o.   MRN: [id number]  HPI   Here with 3 days acute onset fever, facial pain, pressure, general weakness and malaise, and greenish d/c, with slight ST, but little to no cough and Pt denies chest pain, increased sob or doe, wheezing, orthopnea, PND, increased LE swelling, palpitations, dizziness or syncope.  Pt denies new neurological symptoms such as new headache, or facial or extremity weakness or numbness   Pt denies polydipsia, polyuria.  Denies worsening depressive symptoms, suicidal ideation, or panic, though has ongoing anxiety, not increased recently.  ?some confusion about meds, not currently taking the amloidpine 5, working on low salt diet, more excercise Past Medical History  Diagnosis Date  . TB SKIN TEST, POSITIVE 03/28/2007  . SLEEP APNEA, OBSTRUCTIVE, MODERATE 04/09/2007  . RENAL INSUFFICIENCY 03/15/2010  . PVD WITH CLAUDICATION 02/10/2010  . Polymyalgia rheumatica 07/26/2008  . POLYARTHRALGIA 07/13/2008  . Other dysphagia 11/21/2009  . Osteoarth NOS-Unspec 03/28/2007  . LUNG NODULE 12/04/2007  . LOW BACK PAIN 04/09/2007  . LEG PAIN, BILATERAL 01/25/2010  . HYPERTENSION 03/28/2007  . HYPERLIPIDEMIA 12/04/2007  . HEMORRHOIDS, RECURRENT 02/11/2008  . GERD 12/04/2007  . ESOPHAGEAL STRICTURE 05/26/2008  . ERECTILE DYSFUNCTION 04/09/2007  . DYSPHAGIA UNSPECIFIED 03/16/2008  . DIVERTICULOSIS, COLON 12/04/2007  . DEPRESSION 12/04/2007  . Degeneration of cervical intervertebral disc 03/28/2007  . COLONIC POLYPS, HX OF 12/04/2007  . CONSTIPATION 09/25/2010  . CEREBROVASCULAR ACCIDENT, HX OF 07/17/2010  . BENIGN PROSTATIC HYPERTROPHY 04/09/2007  . BACK PAIN 09/16/2009  . ASTHMA 12/04/2007  . ALLERGIC RHINITIS 04/09/2007   Past Surgical History  Procedure Date  . Total knee arthroplasty     x 2  . Rotator cuff repair     Left    reports that he has quit smoking. He does not have any smokeless tobacco history on file. He  reports that he does not drink alcohol or use illicit drugs. family history includes Asthma in his other; Heart disease in his father; Hypertension in his brother; and Lupus in his brother. No Known Allergies Current Outpatient Prescriptions on File Prior to Visit  Medication Sig Dispense Refill  . aspirin 81 MG EC tablet Take 81 mg by mouth daily.        Marland Kitchen atorvastatin (LIPITOR) 40 MG tablet Take 1 tablet (40 mg total) by mouth daily.  90 tablet  3  . cetirizine (ZYRTEC) 10 MG tablet Take 10 mg by mouth daily.        . Glycerin, Laxative, 5.6 GM/DOSE ENEM Place rectally. 1 per rectum every 6 hours as needed for constipation       . lactulose (CHRONULAC) 10 GM/15ML solution TAKE 30 MILLITERS (2 TABLESPOONSFUL) BY MOUTH 1 TO 2 TIMES DAILY FOR CONSTIPATION  1419 mL  0  . lisinopril (PRINIVIL,ZESTRIL) 20 MG tablet Take 1 tablet (20 mg total) by mouth daily.  90 tablet  3  . Tamsulosin HCl (FLOMAX) 0.4 MG CAPS take 1 capsule by mouth once daily  90 capsule  3   Review of Systems Review of Systems  Constitutional: Negative for diaphoresis and unexpected weight change.  HENT: Negative for drooling and tinnitus.   Eyes: Negative for photophobia and visual disturbance.  Respiratory: Negative for choking and stridor.   Gastrointestinal: Negative for vomiting and blood in stool.  Genitourinary: Negative for hematuria and decreased urine volume.      Objective:   Physical Exam BP 108/62  Pulse 71  Temp(Src) 98.3 F (36.8 C) (Oral)  Ht 5\' 10"  (1.778 m)  Wt 274 lb (124.286 kg)  BMI 39.32 kg/m2  SpO2 98% Physical Exam  VS noted, mild ill Constitutional: Pt appears well-developed and well-nourished.  HENT: Head: Normocephalic.  Right Ear: External ear normal.  Left Ear: External ear normal.  Bilat tm's mild erythema.  Sinus tender bilat.  Pharynx mild erythema Eyes: Conjunctivae and EOM are normal. Pupils are equal, round, and reactive to light.  Neck: Normal range of motion. Neck supple.    Cardiovascular: Normal rate and regular rhythm.   Pulmonary/Chest: Effort normal and breath sounds normal.  Neurological: Pt is alert. No cranial nerve deficit.  Skin: Skin is warm. No erythema.  Psychiatric: Pt behavior is normal. Thought content normal.     Assessment & Plan:

## 2011-06-24 NOTE — Assessment & Plan Note (Signed)
stable overall by hx and exam, most recent data reviewed with pt, and pt to continue medical treatment as before  Lab Results  Component Value Date   WBC 3.6* 10/10/2010   HGB 14.1 10/10/2010   HCT 41.7 10/10/2010   PLT 265.0 10/10/2010   GLUCOSE 99 06/07/2011   CHOL 152 06/07/2011   TRIG 69.0 06/07/2011   HDL 58.80 06/07/2011   LDLDIRECT 156.9 10/10/2010   LDLCALC 79 06/07/2011   ALT 14 11/08/2010   AST 18 11/08/2010   NA 142 06/07/2011   K 3.8 06/07/2011   CL 105 06/07/2011   CREATININE 1.6* 06/07/2011   BUN 16 06/07/2011   CO2 30 06/07/2011   TSH 1.56 10/25/2010   PSA 0.78 10/10/2010

## 2011-06-24 NOTE — Assessment & Plan Note (Signed)
stable overall by hx and exam, most recent data reviewed with pt, and pt to continue medical treatment as before  BP Readings from Last 3 Encounters:  06/20/11 108/62  06/07/11 148/68  04/11/11 132/72

## 2011-07-01 ENCOUNTER — Encounter (HOSPITAL_COMMUNITY): Payer: Self-pay

## 2011-07-01 ENCOUNTER — Emergency Department (HOSPITAL_COMMUNITY)
Admission: EM | Admit: 2011-07-01 | Discharge: 2011-07-01 | Disposition: A | Discharge status: Home / Self Care | Payer: PRIVATE HEALTH INSURANCE | Attending: Emergency Medicine | Admitting: Emergency Medicine

## 2011-07-01 DIAGNOSIS — J029 Acute pharyngitis, unspecified: Secondary | ICD-10-CM | POA: Insufficient documentation

## 2011-07-01 DIAGNOSIS — E785 Hyperlipidemia, unspecified: Secondary | ICD-10-CM | POA: Insufficient documentation

## 2011-07-01 DIAGNOSIS — B9789 Other viral agents as the cause of diseases classified elsewhere: Secondary | ICD-10-CM

## 2011-07-01 DIAGNOSIS — I10 Essential (primary) hypertension: Secondary | ICD-10-CM | POA: Insufficient documentation

## 2011-07-01 DIAGNOSIS — H9209 Otalgia, unspecified ear: Secondary | ICD-10-CM | POA: Insufficient documentation

## 2011-07-01 DIAGNOSIS — Z8673 Personal history of transient ischemic attack (TIA), and cerebral infarction without residual deficits: Secondary | ICD-10-CM | POA: Insufficient documentation

## 2011-07-01 DIAGNOSIS — Z79899 Other long term (current) drug therapy: Secondary | ICD-10-CM | POA: Insufficient documentation

## 2011-07-01 LAB — RAPID STREP SCREEN (MED CTR MEBANE ONLY): Streptococcus, Group A Screen (Direct): NEGATIVE

## 2011-07-01 MED ORDER — DEXAMETHASONE 4 MG PO TABS
4.0000 mg | ORAL_TABLET | Freq: Once | ORAL | Status: DC
  Filled 2011-07-01: qty 1

## 2011-07-01 MED ORDER — HYDROCODONE-ACETAMINOPHEN 7.5-500 MG/15ML PO SOLN
ORAL | Status: AC
  Filled 2011-07-01: qty 30

## 2011-07-01 MED ORDER — HYDROCODONE-ACETAMINOPHEN 7.5-500 MG/15ML PO SOLN
10.0000 mL | Freq: Once | ORAL | Status: AC
  Administered 2011-07-01: 5 mL via ORAL

## 2011-07-01 MED ORDER — HYDROCODONE-HOMATROPINE 5-1.5 MG/5ML PO SYRP: 5.0000 mL | ORAL_SOLUTION | Freq: Four times a day (QID) | ORAL | Status: AC | PRN

## 2011-07-01 MED ORDER — DEXAMETHASONE SODIUM PHOSPHATE 10 MG/ML IJ SOLN
10.0000 mg | Freq: Once | INTRAMUSCULAR | Status: AC
  Administered 2011-07-01: 10 mg via INTRAMUSCULAR
  Filled 2011-07-01: qty 1

## 2011-07-01 NOTE — ED Provider Notes (Signed)
Medical screening examination/treatment/procedure(s) were performed by non-physician practitioner and as supervising physician I was immediately available for consultation/collaboration.   Trisha Mangle, MD 07/01/11 1250

## 2011-07-01 NOTE — ED Provider Notes (Signed)
History     CSN: [id number] Arrival date & time: 07/01/2011  8:23 AM   First MD Initiated Contact with Patient 07/01/11 [phone]      Chief Complaint  Patient presents with  . Sore Throat    pt in with sore throat and left ear pain states onset last night    (Consider location/radiation/quality/duration/timing/severity/associated sxs/prior treatment) HPI Comments: Pt complains of sore throat that started last night around 10:00pm. Pt states it hurts when he swallows. Denies Fever nights sweats, chills, sinus pressure, change in vision, trismus,  difficulty breathing, wheezing or stridor. Pt has seasonal allergies. In addition pt states he has left ear pain that started last night. Explains that it pops and hurts on the inside. No other complaints Pt states he is not driving.  Patient is a 65 y.o. male presenting with pharyngitis. The history is provided by the patient.  Sore Throat Associated symptoms include a sore throat. Pertinent negatives include no abdominal pain, chest pain, chills, congestion, coughing, diaphoresis, fatigue, fever, headaches, neck pain or rash.    Past Medical History  Diagnosis Date  . TB SKIN TEST, POSITIVE 03/28/2007  . SLEEP APNEA, OBSTRUCTIVE, MODERATE 04/09/2007  . RENAL INSUFFICIENCY 03/15/2010  . PVD WITH CLAUDICATION 02/10/2010  . Polymyalgia rheumatica 07/26/2008  . POLYARTHRALGIA 07/13/2008  . Other dysphagia 11/21/2009  . Osteoarth NOS-Unspec 03/28/2007  . LUNG NODULE 12/04/2007  . LOW BACK PAIN 04/09/2007  . LEG PAIN, BILATERAL 01/25/2010  . HYPERTENSION 03/28/2007  . HYPERLIPIDEMIA 12/04/2007  . HEMORRHOIDS, RECURRENT 02/11/2008  . GERD 12/04/2007  . ESOPHAGEAL STRICTURE 05/26/2008  . ERECTILE DYSFUNCTION 04/09/2007  . DYSPHAGIA UNSPECIFIED 03/16/2008  . DIVERTICULOSIS, COLON 12/04/2007  . DEPRESSION 12/04/2007  . Degeneration of cervical intervertebral disc 03/28/2007  . COLONIC POLYPS, HX OF 12/04/2007  . CONSTIPATION 09/25/2010  . CEREBROVASCULAR ACCIDENT,  HX OF 07/17/2010  . BENIGN PROSTATIC HYPERTROPHY 04/09/2007  . BACK PAIN 09/16/2009  . ASTHMA 12/04/2007  . ALLERGIC RHINITIS 04/09/2007    Past Surgical History  Procedure Date  . Total knee arthroplasty     x 2  . Rotator cuff repair     Left    Family History  Problem Relation Age of Onset  . Heart disease Father   . Lupus Brother   . Hypertension Brother   . Asthma Other     History  Substance Use Topics  . Smoking status: Former Research scientist (life sciences)  . Smokeless tobacco: Not on file  . Alcohol Use: No      Review of Systems  Constitutional: Positive for appetite change. Negative for fever, chills, diaphoresis, activity change and fatigue.  HENT: Positive for ear pain, sore throat and trouble swallowing. Negative for congestion, facial swelling, rhinorrhea, sneezing, drooling, neck pain, neck stiffness, dental problem, voice change, postnasal drip and sinus pressure.   Eyes: Negative for visual disturbance.  Respiratory: Negative for cough, choking, shortness of breath, wheezing and stridor.   Cardiovascular: Negative for chest pain.  Gastrointestinal: Negative for abdominal pain.  Skin: Negative for rash.  Neurological: Negative for dizziness and headaches.  Hematological: Positive for adenopathy. Does not bruise/bleed easily.    Allergies  Review of patient's allergies indicates no known allergies.  Home Medications   Current Outpatient Rx  Name Route Sig Dispense Refill  . ATORVASTATIN CALCIUM 40 MG PO TABS Oral Take 1 tablet (40 mg total) by mouth daily. 90 tablet 3  . LACTULOSE 10 GM/15ML PO SOLN       . LISINOPRIL 20 MG PO  TABS Oral Take 1 tablet (20 mg total) by mouth daily. 90 tablet 3  . TAMSULOSIN HCL 0.4 MG PO CAPS  take 1 capsule by mouth once daily 90 capsule 3  . GLYCERIN (LAXATIVE) 5.6 GM/DOSE RE [facility] Rectal Place rectally. 1 per rectum every 6 hours as needed for constipation       BP 138/77  Pulse 78  Temp(Src) 98.6 F (37 C) (Oral)  Resp 18  SpO2  99%  Physical Exam  Constitutional: He is oriented to person, place, and time. He appears well-developed and well-nourished. No distress.  HENT:  Head: Normocephalic and atraumatic. No trismus in the jaw.  Right Ear: Hearing, tympanic membrane, external ear and ear canal normal. No drainage or tenderness. No mastoid tenderness. Tympanic membrane is not injected, not erythematous and not bulging.  Left Ear: Hearing, tympanic membrane, external ear and ear canal normal. No drainage or tenderness. No mastoid tenderness. Tympanic membrane is not injected, not erythematous and not bulging.  Nose: Nose normal. No rhinorrhea. Right sinus exhibits no maxillary sinus tenderness and no frontal sinus tenderness. Left sinus exhibits no maxillary sinus tenderness and no frontal sinus tenderness.  Mouth/Throat: Uvula is midline and mucous membranes are normal. Normal dentition. No dental abscesses or uvula swelling. No oropharyngeal exudate, posterior oropharyngeal edema, posterior oropharyngeal erythema or tonsillar abscesses.  Neck: Trachea normal, normal range of motion and full passive range of motion without pain. Neck supple. No rigidity. Normal range of motion present. No Brudzinski's sign noted.  Cardiovascular: Normal rate and regular rhythm.   Pulmonary/Chest: Effort normal and breath sounds normal.  Abdominal: Soft.  Musculoskeletal: Normal range of motion.  Lymphadenopathy:       Head (right side): No preauricular and no posterior auricular adenopathy present.       Head (left side): No preauricular and no posterior auricular adenopathy present.    He has no cervical adenopathy.  Neurological: He is alert and oriented to person, place, and time.  Skin: Skin is warm and dry. He is not diaphoretic.  Psychiatric: He has a normal mood and affect.    ED Course  Procedures (including critical care time)   Labs Reviewed  RAPID STREP SCREEN   No results found.   No diagnosis found.    [place], [place] 07/01/11 1001

## 2011-09-03 ENCOUNTER — Emergency Department (HOSPITAL_COMMUNITY)
Admission: EM | Admit: 2011-09-03 | Discharge: 2011-09-03 | Disposition: A | Discharge status: Home / Self Care | Payer: PRIVATE HEALTH INSURANCE | Attending: Emergency Medicine | Admitting: Emergency Medicine

## 2011-09-03 ENCOUNTER — Encounter (HOSPITAL_COMMUNITY): Payer: Self-pay | Admitting: Family Medicine

## 2011-09-03 DIAGNOSIS — Z8673 Personal history of transient ischemic attack (TIA), and cerebral infarction without residual deficits: Secondary | ICD-10-CM | POA: Insufficient documentation

## 2011-09-03 DIAGNOSIS — E785 Hyperlipidemia, unspecified: Secondary | ICD-10-CM | POA: Insufficient documentation

## 2011-09-03 DIAGNOSIS — R0981 Nasal congestion: Secondary | ICD-10-CM

## 2011-09-03 DIAGNOSIS — I10 Essential (primary) hypertension: Secondary | ICD-10-CM | POA: Insufficient documentation

## 2011-09-03 DIAGNOSIS — J3489 Other specified disorders of nose and nasal sinuses: Secondary | ICD-10-CM | POA: Insufficient documentation

## 2011-09-03 DIAGNOSIS — J069 Acute upper respiratory infection, unspecified: Secondary | ICD-10-CM | POA: Insufficient documentation

## 2011-09-03 MED ORDER — OXYMETAZOLINE HCL 0.05 % NA SOLN
2.0000 | Freq: Once | NASAL | Status: AC
  Administered 2011-09-03: 2 via NASAL
  Filled 2011-09-03: qty 15

## 2011-09-03 MED ORDER — GUAIFENESIN ER 1200 MG PO TB12: 1.0000 | ORAL_TABLET | Freq: Two times a day (BID) | ORAL | Status: DC

## 2011-09-03 MED ORDER — ACETAMINOPHEN-CODEINE 120-12 MG/5ML PO SOLN: 10.0000 mL | Freq: Four times a day (QID) | ORAL | Status: AC | PRN

## 2011-09-03 MED ORDER — PREDNISONE 50 MG PO TABS: 50.0000 mg | ORAL_TABLET | Freq: Every day | ORAL | Status: AC

## 2011-09-03 NOTE — ED Provider Notes (Signed)
Medical screening examination/treatment/procedure(s) were performed by non-physician practitioner and as supervising physician I was immediately available for consultation/collaboration.    Dot Lanes, MD 09/03/11 2013

## 2011-09-03 NOTE — ED Provider Notes (Signed)
History     CSN: [id number]  Arrival date & time 09/03/11  1006   First MD Initiated Contact with Patient 09/03/11 1032      Chief Complaint  Patient presents with  . URI    (Consider location/radiation/quality/duration/timing/severity/associated sxs/prior treatment) HPI Patient presents the emergency department with a two-week history of upper respiratory was tract infection symptoms.  He states that over the last 3 or 4 days his nose gotten more congested in causing more pressure in his face.  Patient denies fever, abdominal pain, chest pain, shortness of breath, nausea/vomiting, weakness, or diarrhea.  Past Medical History  Diagnosis Date  . TB SKIN TEST, POSITIVE 03/28/2007  . SLEEP APNEA, OBSTRUCTIVE, MODERATE 04/09/2007  . RENAL INSUFFICIENCY 03/15/2010  . PVD WITH CLAUDICATION 02/10/2010  . Polymyalgia rheumatica 07/26/2008  . POLYARTHRALGIA 07/13/2008  . Other dysphagia 11/21/2009  . Osteoarth NOS-Unspec 03/28/2007  . LUNG NODULE 12/04/2007  . LOW BACK PAIN 04/09/2007  . LEG PAIN, BILATERAL 01/25/2010  . HYPERTENSION 03/28/2007  . HYPERLIPIDEMIA 12/04/2007  . HEMORRHOIDS, RECURRENT 02/11/2008  . GERD 12/04/2007  . ESOPHAGEAL STRICTURE 05/26/2008  . ERECTILE DYSFUNCTION 04/09/2007  . DYSPHAGIA UNSPECIFIED 03/16/2008  . DIVERTICULOSIS, COLON 12/04/2007  . DEPRESSION 12/04/2007  . Degeneration of cervical intervertebral disc 03/28/2007  . COLONIC POLYPS, HX OF 12/04/2007  . CONSTIPATION 09/25/2010  . CEREBROVASCULAR ACCIDENT, HX OF 07/17/2010  . BENIGN PROSTATIC HYPERTROPHY 04/09/2007  . BACK PAIN 09/16/2009  . ASTHMA 12/04/2007  . ALLERGIC RHINITIS 04/09/2007    Past Surgical History  Procedure Date  . Total knee arthroplasty     x 2  . Rotator cuff repair     Left    Family History  Problem Relation Age of Onset  . Heart disease Father   . Lupus Brother   . Hypertension Brother   . Asthma Other     History  Substance Use Topics  . Smoking status: Former Research scientist (life sciences)  . Smokeless  tobacco: Not on file  . Alcohol Use: No      Review of Systems All pertinent positives and negatives reviewed in the history of present illness  Allergies  Review of patient's allergies indicates no known allergies.  Home Medications   Current Outpatient Rx  Name Route Sig Dispense Refill  . ATORVASTATIN CALCIUM 40 MG PO TABS Oral Take 1 tablet (40 mg total) by mouth daily. 90 tablet 3  . BISACODYL 5 MG PO TBEC Oral Take 10 mg by mouth at bedtime as needed. For constipation    . HYDROCHLOROTHIAZIDE 12.5 MG PO CAPS Oral Take 12.5 mg by mouth daily.      Marland Kitchen LACTULOSE 10 GM/15ML PO SOLN      . LISINOPRIL 20 MG PO TABS Oral Take 1 tablet (20 mg total) by mouth daily. 90 tablet 3  . TAMSULOSIN HCL 0.4 MG PO CAPS  take 1 capsule by mouth once daily 90 capsule 3    BP 143/67  Pulse 75  Temp(Src) 99 F (37.2 C) (Oral)  Resp 20  Ht 5' 9.5" (1.765 m)  Wt 270 lb (122.471 kg)  BMI 39.30 kg/m2  SpO2 99%  Physical Exam  Constitutional: He is oriented to person, place, and time. He appears well-developed and well-nourished. No distress.  HENT:  Head: Normocephalic and atraumatic.  Nose: Mucosal edema and rhinorrhea present. Right sinus exhibits no maxillary sinus tenderness and no frontal sinus tenderness. Left sinus exhibits no maxillary sinus tenderness and no frontal sinus tenderness.  Mouth/Throat: Uvula is midline,  oropharynx is clear and moist and mucous membranes are normal. No posterior oropharyngeal edema or posterior oropharyngeal erythema.  Eyes: Pupils are equal, round, and reactive to light.  Neck: Normal range of motion. Neck supple. No rigidity.  Cardiovascular: Normal rate, regular rhythm and normal heart sounds.  Exam reveals no gallop and no friction rub.   No murmur heard. Pulmonary/Chest: Effort normal and breath sounds normal. He has no wheezes. He has no rales.  Neurological: He is alert and oriented to person, place, and time.  Skin: Skin is warm and dry. No rash  noted. No erythema. No pallor.    ED Course  Procedures (including critical care time)  Patient has significant nasal congestion.  Will Treat patient for this and have him followup with primary care Dr.  Patient advised return here for any worsening his condition.  At this point it does not appear that the patient has sinusitis.         MDM  See above.         Brent General, PA-C 09/03/11 1127

## 2011-09-03 NOTE — ED Notes (Signed)
Pt reports having congestion, headache, eye pain, coughing, and sore throat x2 weeks.

## 2011-09-21 ENCOUNTER — Encounter (HOSPITAL_COMMUNITY): Payer: Self-pay | Admitting: Emergency Medicine

## 2011-09-21 ENCOUNTER — Emergency Department (HOSPITAL_COMMUNITY)
Admission: EM | Admit: 2011-09-21 | Discharge: 2011-09-22 | Disposition: A | Discharge status: Home / Self Care | Payer: PRIVATE HEALTH INSURANCE | Attending: Emergency Medicine | Admitting: Emergency Medicine

## 2011-09-21 DIAGNOSIS — R220 Localized swelling, mass and lump, head: Secondary | ICD-10-CM | POA: Insufficient documentation

## 2011-09-21 DIAGNOSIS — T46905A Adverse effect of unspecified agents primarily affecting the cardiovascular system, initial encounter: Secondary | ICD-10-CM | POA: Insufficient documentation

## 2011-09-21 DIAGNOSIS — F3289 Other specified depressive episodes: Secondary | ICD-10-CM | POA: Insufficient documentation

## 2011-09-21 DIAGNOSIS — R490 Dysphonia: Secondary | ICD-10-CM | POA: Insufficient documentation

## 2011-09-21 DIAGNOSIS — M353 Polymyalgia rheumatica: Secondary | ICD-10-CM | POA: Insufficient documentation

## 2011-09-21 DIAGNOSIS — Z8673 Personal history of transient ischemic attack (TIA), and cerebral infarction without residual deficits: Secondary | ICD-10-CM | POA: Insufficient documentation

## 2011-09-21 DIAGNOSIS — E785 Hyperlipidemia, unspecified: Secondary | ICD-10-CM | POA: Insufficient documentation

## 2011-09-21 DIAGNOSIS — Z79899 Other long term (current) drug therapy: Secondary | ICD-10-CM | POA: Insufficient documentation

## 2011-09-21 DIAGNOSIS — I739 Peripheral vascular disease, unspecified: Secondary | ICD-10-CM | POA: Insufficient documentation

## 2011-09-21 DIAGNOSIS — K219 Gastro-esophageal reflux disease without esophagitis: Secondary | ICD-10-CM | POA: Insufficient documentation

## 2011-09-21 DIAGNOSIS — F329 Major depressive disorder, single episode, unspecified: Secondary | ICD-10-CM | POA: Insufficient documentation

## 2011-09-21 DIAGNOSIS — I10 Essential (primary) hypertension: Secondary | ICD-10-CM | POA: Insufficient documentation

## 2011-09-21 DIAGNOSIS — T783XXA Angioneurotic edema, initial encounter: Secondary | ICD-10-CM

## 2011-09-21 DIAGNOSIS — J45909 Unspecified asthma, uncomplicated: Secondary | ICD-10-CM | POA: Insufficient documentation

## 2011-09-21 DIAGNOSIS — M1990 Unspecified osteoarthritis, unspecified site: Secondary | ICD-10-CM | POA: Insufficient documentation

## 2011-09-21 DIAGNOSIS — R221 Localized swelling, mass and lump, neck: Secondary | ICD-10-CM | POA: Insufficient documentation

## 2011-09-21 LAB — POCT I-STAT, CHEM 8
Calcium, Ion: 1.2 mmol/L (ref 1.12–1.32)
Glucose, Bld: 145 mg/dL — ABNORMAL HIGH (ref 70–99)
HCT: 40 % (ref 39.0–52.0)
TCO2: 25 mmol/L (ref 0–100)

## 2011-09-21 MED ORDER — DIPHENHYDRAMINE HCL 50 MG/ML IJ SOLN
50.0000 mg | Freq: Once | INTRAMUSCULAR | Status: AC
  Administered 2011-09-21: 50 mg via INTRAVENOUS
  Filled 2011-09-21: qty 1

## 2011-09-21 MED ORDER — FAMOTIDINE IN NACL 20-0.9 MG/50ML-% IV SOLN
20.0000 mg | Freq: Once | INTRAVENOUS | Status: AC
  Administered 2011-09-21: 20 mg via INTRAVENOUS
  Filled 2011-09-21: qty 50

## 2011-09-21 MED ORDER — DEXAMETHASONE SODIUM PHOSPHATE 10 MG/ML IJ SOLN
10.0000 mg | Freq: Once | INTRAMUSCULAR | Status: AC
  Administered 2011-09-21: 10 mg via INTRAVENOUS
  Filled 2011-09-21: qty 1

## 2011-09-21 NOTE — ED Provider Notes (Addendum)
History     CSN: [id number]  Arrival date & time 09/21/11  2221   First MD Initiated Contact with Patient 09/21/11 2236      Chief Complaint  Patient presents with  . Oral Swelling    (Consider location/radiation/quality/duration/timing/severity/associated sxs/prior treatment) HPI  65yoM h/o HTN on lisinopril pw patient of toe swelling. The patient states that approximately 3 and half hours prior to arrival he ate and only first began to feel scratchiness back of his throat. He had a transient foreign body sensation. He states that shortly thereafter he felt that his throat was swelling. He states that he did lie down for a nap and felt that he could not breathe at that time. He states that his symptoms have been about the same since his arrival to the emergency department. He did not like its getting better or worse. He states that he's been taking lisinopril for several years. He thinks that he had a recent increase in the dose of his lisinopril. He feels that his voice is muffled. He's had no noisy breathing or wheezing. No new exposures today. He denies rash, itching.   ED Notes, ED Provider Notes from 09/21/11 0000 to 09/21/11 22:25:36       Duffy Bruce, RN 09/21/2011 22:23      Pt alert, present to ED c/o sore throat, States " i feel like my throat is swelling", onset this evening after eating "waffers and milk", denies food allergy, resp even unlabored, speech clear, voice hoarse, no stridor noted    Past Medical History  Diagnosis Date  . TB SKIN TEST, POSITIVE 03/28/2007  . SLEEP APNEA, OBSTRUCTIVE, MODERATE 04/09/2007  . RENAL INSUFFICIENCY 03/15/2010  . PVD WITH CLAUDICATION 02/10/2010  . Polymyalgia rheumatica 07/26/2008  . POLYARTHRALGIA 07/13/2008  . Other dysphagia 11/21/2009  . Osteoarth NOS-Unspec 03/28/2007  . LUNG NODULE 12/04/2007  . LOW BACK PAIN 04/09/2007  . LEG PAIN, BILATERAL 01/25/2010  . HYPERTENSION 03/28/2007  . HYPERLIPIDEMIA 12/04/2007  . HEMORRHOIDS, RECURRENT  02/11/2008  . GERD 12/04/2007  . ESOPHAGEAL STRICTURE 05/26/2008  . ERECTILE DYSFUNCTION 04/09/2007  . DYSPHAGIA UNSPECIFIED 03/16/2008  . DIVERTICULOSIS, COLON 12/04/2007  . DEPRESSION 12/04/2007  . Degeneration of cervical intervertebral disc 03/28/2007  . COLONIC POLYPS, HX OF 12/04/2007  . CONSTIPATION 09/25/2010  . CEREBROVASCULAR ACCIDENT, HX OF 07/17/2010  . BENIGN PROSTATIC HYPERTROPHY 04/09/2007  . BACK PAIN 09/16/2009  . ASTHMA 12/04/2007  . ALLERGIC RHINITIS 04/09/2007    Past Surgical History  Procedure Date  . Total knee arthroplasty     x 2  . Rotator cuff repair     Left    Family History  Problem Relation Age of Onset  . Heart disease Father   . Lupus Brother   . Hypertension Brother   . Asthma Other     History  Substance Use Topics  . Smoking status: Former Research scientist (life sciences)  . Smokeless tobacco: Not on file  . Alcohol Use: No    Review of Systems  All other systems reviewed and are negative.  except as noted HPI   Allergies  Review of patient's allergies indicates no known allergies.  Home Medications   Current Outpatient Rx  Name Route Sig Dispense Refill  . ATORVASTATIN CALCIUM 40 MG PO TABS Oral Take 1 tablet (40 mg total) by mouth daily. 90 tablet 3  . BISACODYL 5 MG PO TBEC Oral Take 10 mg by mouth at bedtime as needed. For constipation    . HYDROCHLOROTHIAZIDE 12.5 MG  PO CAPS Oral Take 12.5 mg by mouth daily.      Marland Kitchen LACTULOSE 10 GM/15ML PO SOLN      . LISINOPRIL 20 MG PO TABS Oral Take 1 tablet (20 mg total) by mouth daily. 90 tablet 3  . TAMSULOSIN HCL 0.4 MG PO CAPS  take 1 capsule by mouth once daily 90 capsule 3    BP 139/67  Pulse 87  Temp(Src) 98 F (36.7 C) (Oral)  Resp 16  Wt 275 lb (124.739 kg)  SpO2 99%  Physical Exam  Nursing note and vitals reviewed. Constitutional: He is oriented to person, place, and time. He appears well-developed and well-nourished. No distress.  HENT:  Head: Atraumatic.  Mouth/Throat:         R sup soft palate  and uvula with diffuse boggy swelling Min erythema No exudates No stridor Min hoarse voice  Eyes: Conjunctivae are normal. Pupils are equal, round, and reactive to light.  Neck: Neck supple.  Cardiovascular: Normal rate, regular rhythm, normal heart sounds and intact distal pulses.  Exam reveals no gallop and no friction rub.   No murmur heard. Pulmonary/Chest: Effort normal. No respiratory distress. He has no wheezes. He has no rales.  Abdominal: Soft. Bowel sounds are normal. There is no tenderness. There is no rebound and no guarding.  Musculoskeletal: Normal range of motion. He exhibits no edema and no tenderness.  Neurological: He is alert and oriented to person, place, and time.  Skin: Skin is warm and dry.  Psychiatric: He has a normal mood and affect.    ED Course  Procedures (including critical care time)  Labs Reviewed  POCT I-STAT, CHEM 8 - Abnormal; Notable for the following:    Glucose, Bld 145 (*)    All other components within normal limits   No results found.   1. Angioedema     MDM   Patient presents with throat swelling. His airway is intact at this time. I believe his posterior oropharynx edema secondary to angioedema from his ACE inhibitor. There is no stridor at this time he does have a hoarse voice. I discussed this case with Dr. Thea Gist who will come in to evaluate and scope the patient in the emergency department. He has been given decadron, benadryl, pepcid.  1129 PM patient continues to have sensation of throat swelling states it is not getting worse at this time. Pt signed out to Dr. Zenia Resides. F/U ENT recommendations, likely admit.       Blair Heys, MD 09/21/11 [id number]  Blair Heys, MD 09/21/11 [phone]

## 2011-09-21 NOTE — ED Notes (Signed)
Pt alert, present to ED c/o sore throat, States " i feel like my throat is swelling", onset this evening after eating "waffers and milk", denies food allergy, resp even unlabored, speech clear, voice hoarse, no stridor noted

## 2011-09-22 MED ORDER — PREDNISONE 10 MG PO TABS: 20.0000 mg | ORAL_TABLET | Freq: Every day | ORAL | Status: DC

## 2011-09-22 MED ORDER — AMLODIPINE BESYLATE 10 MG PO TABS: 10.0000 mg | ORAL_TABLET | Freq: Every day | ORAL | Status: DC

## 2011-09-22 MED ORDER — FAMOTIDINE 20 MG PO TABS: 20.0000 mg | ORAL_TABLET | Freq: Two times a day (BID) | ORAL | Status: DC

## 2011-09-22 NOTE — Consult Note (Signed)
Reason for Consult: Angioedema Referring Physician: Blair Heys, MD  HPI:  Troy Palmer is an 65 y.o. male who presents to the [facility] ER last night c/o throat swelling. The patient states that approximately 3 and half hours prior to arrival he ate and only first began to feel scratchiness back of his throat. He had a transient foreign body sensation. He states that shortly thereafter he felt that his throat was swelling. He states that he did lie down for a nap and felt that he could not breathe at that time. He states that his symptoms have improved since his arrival to the emergency department, after he was treated with IV steroid.  He states that he's been taking lisinopril for several years. He thinks that he had a recent increase in the dose of his lisinopril. He feels that his voice is muffled. He's had no noisy breathing or wheezing.  He denies rash, itching.   Past Medical History  Diagnosis Date  . TB SKIN TEST, POSITIVE 03/28/2007  . SLEEP APNEA, OBSTRUCTIVE, MODERATE 04/09/2007  . RENAL INSUFFICIENCY 03/15/2010  . PVD WITH CLAUDICATION 02/10/2010  . Polymyalgia rheumatica 07/26/2008  . POLYARTHRALGIA 07/13/2008  . Other dysphagia 11/21/2009  . Osteoarth NOS-Unspec 03/28/2007  . LUNG NODULE 12/04/2007  . LOW BACK PAIN 04/09/2007  . LEG PAIN, BILATERAL 01/25/2010  . HYPERTENSION 03/28/2007  . HYPERLIPIDEMIA 12/04/2007  . HEMORRHOIDS, RECURRENT 02/11/2008  . GERD 12/04/2007  . ESOPHAGEAL STRICTURE 05/26/2008  . ERECTILE DYSFUNCTION 04/09/2007  . DYSPHAGIA UNSPECIFIED 03/16/2008  . DIVERTICULOSIS, COLON 12/04/2007  . DEPRESSION 12/04/2007  . Degeneration of cervical intervertebral disc 03/28/2007  . COLONIC POLYPS, HX OF 12/04/2007  . CONSTIPATION 09/25/2010  . CEREBROVASCULAR ACCIDENT, HX OF 07/17/2010  . BENIGN PROSTATIC HYPERTROPHY 04/09/2007  . BACK PAIN 09/16/2009  . ASTHMA 12/04/2007  . ALLERGIC RHINITIS 04/09/2007    Past Surgical History  Procedure Date  . Total knee arthroplasty     x 2   . Rotator cuff repair     Left    Family History  Problem Relation Age of Onset  . Heart disease Father   . Lupus Brother   . Hypertension Brother   . Asthma Other   Brother deceased at 68 with systemic lupus erythematosus.  Mother deceased at 65 with brain tumor and hypertension. Father deceased at  13 with coronary artery disease.   Social History:  reports that he has quit smoking. He does not have any smokeless tobacco history on file. He reports that he does not drink alcohol or use illicit drugs.  Allergies: No Known Allergies  Medications: I have reviewed the patient's current medications. Prior to Admission: ATORVASTATIN CALCIUM 40 MG PO TABS  Oral Take 1 tablet (40 mg total) by mouth daily.  BISACODYL 5 MG PO TBEC  Oral Take 10 mg by mouth at bedtime as needed HYDROCHLOROTHIAZIDE 12.5 MG PO CAPS  Oral Take 12.5 mg by mouth daily.  LACTULOSE 10 GM/15ML PO SOLN  LISINOPRIL 20 MG PO TABS  Oral Take 1 tablet (20 mg total) by mouth daily.  TAMSULOSIN HCL 0.4 MG PO CAPS  take 1 capsule by mouth once daily   Results for orders placed during the hospital encounter of 09/21/11 (from the past 48 hour(s))  POCT I-STAT, CHEM 8     Status: Abnormal   Collection Time   09/21/11 11:15 PM      Component Value Range Comment   Sodium 142  135 - 145 (mEq/L)  Potassium 3.7  3.5 - 5.1 (mEq/L)    Chloride 108  96 - 112 (mEq/L)    BUN 22  6 - 23 (mg/dL)    Creatinine, Ser 1.30  0.50 - 1.35 (mg/dL)    Glucose, Bld 145 (*) 70 - 99 (mg/dL)    Calcium, Ion 1.20  1.12 - 1.32 (mmol/L)    TCO2 25  0 - 100 (mmol/L)    Hemoglobin 13.6  13.0 - 17.0 (g/dL)    HCT 40.0  39.0 - 52.0 (%)    REVIEW OF SYSTEMS: Otherwise noncontributory.  Blood pressure 139/67, pulse 87, temperature 98 F (36.7 C), temperature source Oral, resp. rate 16, weight 124.739 kg (275 lb), SpO2 99.00%.  Physical Exam  Nursing note and vitals reviewed.  Constitutional: He is oriented to person, place, and time. He  appears well-developed and well-nourished. No distress.  HENT: Ears: Normal EAC and TMs.  Nose: significant nasal congestion. Head: Atraumatic.  Mouth/Throat: R sup soft palate and uvula with diffuse boggy swelling No exudates No stridor or dysphonia Eyes: Conjunctivae are normal. Pupils are equal, round, and reactive to light.  Neck: Neck supple.  Cardiovascular: Normal rate, regular rhythm. Pulmonary/Chest: Effort normal. No respiratory distress. He has no wheezes. He has no rales.  Musculoskeletal: Normal range of motion. He exhibits no edema and no tenderness.  Neurological: He is alert and oriented to person, place, and time.  Skin: Skin is warm and dry.  Psychiatric: He has a normal mood and affect.   Procedure:  Flexible Fiberoptic Laryngoscopy Anesthesia: Topical oxymetazoline and lidocaine Indication: Angioedema of the pharynx Description: Risks, benefits, and alternatives of flexible endoscopy were explained to the patient. Specific mention was made of the risk of throat numbness with difficulty swallowing, possible bleeding from the nose and mouth, and pain from the procedure.  The patient gave oral consent to proceed.  The nasal cavities were decongested and anesthetised with a combination of oxymetazoline and 4% lidocaine solution.  The flexible scope was inserted into the right nasal cavity and advanced towards the nasopharynx.  Visualized mucosa over the turbinates and septum were normal.  The nasopharynx was clear.  The right oropharyngeal and hypopharyngeal wall was edematous. Larynx was mobile without lesions. Both vocal cords are mobile. The glottis is patent.  No significant airway obstruction.  Assessment/Plan: Pharyngeal and soft palate angioedema. The vocal cords are mobile. The glottis is patent without significant airway obstruction.  May treat with prednisone dose pack.  Contact info given to patient. Pt to follow up in my office in 1 week.  Meleana Commerford,SUI W 09/22/2011, 12:53  AM

## 2011-09-22 NOTE — ED Provider Notes (Signed)
Patient signed out to me by Dr. Justin Mend. Patient has been evaluated by ENT no signs of vocal cord edema. Patient seen by me and no signs of stridor or hoarseness. Tongue is not edematous. Will have patient stop taking his lisinopril and place patient on Norvasc and patient to also be placed on prednisone taper and see his doctor on Monday.  Leota Jacobsen, MD 09/22/11 [phone]

## 2011-09-24 ENCOUNTER — Ambulatory Visit (INDEPENDENT_AMBULATORY_CARE_PROVIDER_SITE_OTHER): Payer: PRIVATE HEALTH INSURANCE | Admitting: Internal Medicine

## 2011-09-24 ENCOUNTER — Encounter: Payer: Self-pay | Admitting: Internal Medicine

## 2011-09-24 VITALS — BP 120/72 | HR 75 | Temp 97.0°F | Ht 70.0 in | Wt 275.0 lb

## 2011-09-24 DIAGNOSIS — T783XXA Angioneurotic edema, initial encounter: Secondary | ICD-10-CM

## 2011-09-24 DIAGNOSIS — J45909 Unspecified asthma, uncomplicated: Secondary | ICD-10-CM

## 2011-09-24 DIAGNOSIS — I10 Essential (primary) hypertension: Secondary | ICD-10-CM

## 2011-09-24 MED ORDER — NEBIVOLOL HCL 10 MG PO TABS: 10.0000 mg | ORAL_TABLET | Freq: Every day | ORAL | Status: DC

## 2011-09-24 NOTE — Assessment & Plan Note (Signed)
With recent angioedema thought due to ACEI now stopped and s/s resolved;  Ok to stop the norvasc, start bystolic 10 mg daily, gave 4 wks samples and rx - hopefully not too expensive

## 2011-09-24 NOTE — Progress Notes (Signed)
Subjective:    Patient ID: Troy Palmer, male    DOB: 10/17/1945, 66 y.o.   MRN: [id number]  HPI  Here to f/u recent ER visit for angioedema with throat swelling, tx with prednisone and ACEI stopped, changed to norvasc, but he is reluctant to continue norvasc as has had some LE edema in the past.  Pt denies chest pain, increased sob or doe, wheezing, orthopnea, PND, increased LE swelling, palpitations, dizziness or syncope.  Pt denies new neurological symptoms such as new headache, or facial or extremity weakness or numbness   Pt denies polydipsia, polyuria.   Pt denies fever, wt loss, night sweats, loss of appetite, or other constitutional symptoms  Overall good compliance with treatment, and good medicine tolerability.   Past Medical History  Diagnosis Date  . TB SKIN TEST, POSITIVE 03/28/2007  . SLEEP APNEA, OBSTRUCTIVE, MODERATE 04/09/2007  . RENAL INSUFFICIENCY 03/15/2010  . PVD WITH CLAUDICATION 02/10/2010  . Polymyalgia rheumatica 07/26/2008  . POLYARTHRALGIA 07/13/2008  . Other dysphagia 11/21/2009  . Osteoarth NOS-Unspec 03/28/2007  . LUNG NODULE 12/04/2007  . LOW BACK PAIN 04/09/2007  . LEG PAIN, BILATERAL 01/25/2010  . HYPERTENSION 03/28/2007  . HYPERLIPIDEMIA 12/04/2007  . HEMORRHOIDS, RECURRENT 02/11/2008  . GERD 12/04/2007  . ESOPHAGEAL STRICTURE 05/26/2008  . ERECTILE DYSFUNCTION 04/09/2007  . DYSPHAGIA UNSPECIFIED 03/16/2008  . DIVERTICULOSIS, COLON 12/04/2007  . DEPRESSION 12/04/2007  . Degeneration of cervical intervertebral disc 03/28/2007  . COLONIC POLYPS, HX OF 12/04/2007  . CONSTIPATION 09/25/2010  . CEREBROVASCULAR ACCIDENT, HX OF 07/17/2010  . BENIGN PROSTATIC HYPERTROPHY 04/09/2007  . BACK PAIN 09/16/2009  . ASTHMA 12/04/2007  . ALLERGIC RHINITIS 04/09/2007   Past Surgical History  Procedure Date  . Total knee arthroplasty     x 2  . Rotator cuff repair     Left    reports that he has quit smoking. He does not have any smokeless tobacco history on file. He reports that he  does not drink alcohol or use illicit drugs. family history includes Asthma in his other; Heart disease in his father; Hypertension in his brother; and Lupus in his brother. Allergies  Allergen Reactions  . Ace Inhibitors Swelling    Angioedema throat   Current Outpatient Prescriptions on File Prior to Visit  Medication Sig Dispense Refill  . atorvastatin (LIPITOR) 40 MG tablet Take 1 tablet (40 mg total) by mouth daily.  90 tablet  3  . bisacodyl (BISACODYL) 5 MG EC tablet Take 10 mg by mouth at bedtime as needed. For constipation      . famotidine (PEPCID) 20 MG tablet Take 1 tablet (20 mg total) by mouth 2 (two) times daily.  10 tablet  0  . hydrochlorothiazide (MICROZIDE) 12.5 MG capsule Take 12.5 mg by mouth daily.        Marland Kitchen lactulose (CHRONULAC) 10 GM/15ML solution       . Tamsulosin HCl (FLOMAX) 0.4 MG CAPS take 1 capsule by mouth once daily  90 capsule  3  . predniSONE (DELTASONE) 10 MG tablet Take 2 tablets (20 mg total) by mouth daily.  10 tablet  0    Review of Systems Review of Systems  Constitutional: Negative for diaphoresis and unexpected weight change.  HENT: Negative for drooling and tinnitus.   Eyes: Negative for photophobia and visual disturbance.  Respiratory: Negative for choking and stridor.   Gastrointestinal: Negative for vomiting and blood in stool.  Genitourinary: Negative for hematuria and decreased urine volume.    Objective:  Physical Exam BP 120/72  Pulse 75  Temp(Src) 97 F (36.1 C) (Oral)  Ht 5\' 10"  (1.778 m)  Wt 275 lb (124.739 kg)  BMI 39.46 kg/m2  SpO2 97% Physical Exam  VS noted Constitutional: Pt appears well-developed and well-nourished.  HENT: Head: Normocephalic. No facial, tongue, throat swelling Right Ear: External ear normal.  Left Ear: External ear normal.  Eyes: Conjunctivae and EOM are normal. Pupils are equal, round, and reactive to light.  Neck: Normal range of motion. Neck supple.  Cardiovascular: Normal rate and regular  rhythm.   Pulmonary/Chest: Effort normal and breath sounds normal.  Neurological: Pt is alert. No cranial nerve deficit.  Skin: Skin is warm. No erythema. No rash Psychiatric: Pt behavior is normal. Thought content normal.     Assessment & Plan:

## 2011-09-24 NOTE — Patient Instructions (Addendum)
Ok to stop the [employer] the [employer] at 10 mg per day - start with the sample at 1 per day Continue all other medications as before Please check with your pharmacist about the cost with the prescription

## 2011-09-24 NOTE — Assessment & Plan Note (Signed)
Resolved, pt educated adn reassured, no further ACE in the future, add to allergy list

## 2011-09-24 NOTE — Assessment & Plan Note (Signed)
stable overall by hx and exam, most recent data reviewed with pt, and pt to continue medical treatment as before  SpO2 Readings from Last 3 Encounters:  09/24/11 97%  09/22/11 99%  09/03/11 99%

## 2011-10-11 ENCOUNTER — Ambulatory Visit: Payer: PRIVATE HEALTH INSURANCE | Admitting: Internal Medicine

## 2011-10-15 ENCOUNTER — Encounter: Payer: Self-pay | Admitting: Internal Medicine

## 2011-10-15 ENCOUNTER — Ambulatory Visit (INDEPENDENT_AMBULATORY_CARE_PROVIDER_SITE_OTHER): Payer: PRIVATE HEALTH INSURANCE | Admitting: Internal Medicine

## 2011-10-15 ENCOUNTER — Other Ambulatory Visit (INDEPENDENT_AMBULATORY_CARE_PROVIDER_SITE_OTHER): Payer: PRIVATE HEALTH INSURANCE

## 2011-10-15 VITALS — BP 112/72 | HR 57 | Temp 97.0°F | Ht 70.0 in | Wt 281.8 lb

## 2011-10-15 DIAGNOSIS — N400 Enlarged prostate without lower urinary tract symptoms: Secondary | ICD-10-CM

## 2011-10-15 DIAGNOSIS — Z23 Encounter for immunization: Secondary | ICD-10-CM

## 2011-10-15 DIAGNOSIS — Z0000 Encounter for general adult medical examination without abnormal findings: Secondary | ICD-10-CM

## 2011-10-15 DIAGNOSIS — J309 Allergic rhinitis, unspecified: Secondary | ICD-10-CM

## 2011-10-15 DIAGNOSIS — R351 Nocturia: Secondary | ICD-10-CM

## 2011-10-15 DIAGNOSIS — E785 Hyperlipidemia, unspecified: Secondary | ICD-10-CM

## 2011-10-15 DIAGNOSIS — N189 Chronic kidney disease, unspecified: Secondary | ICD-10-CM

## 2011-10-15 LAB — CBC WITH DIFFERENTIAL/PLATELET
Basophils Absolute: 0 10*3/uL (ref 0.0–0.1)
Eosinophils Relative: 5 % (ref 0.0–5.0)
Lymphs Abs: 1.6 10*3/uL (ref 0.7–4.0)
Monocytes Relative: 15 % — ABNORMAL HIGH (ref 3.0–12.0)
Neutrophils Relative %: 45 % (ref 43.0–77.0)
Platelets: 210 10*3/uL (ref 150.0–400.0)
RDW: 12.9 % (ref 11.5–14.6)
WBC: 4.7 10*3/uL (ref 4.5–10.5)

## 2011-10-15 LAB — URINALYSIS, ROUTINE W REFLEX MICROSCOPIC
Bilirubin Urine: NEGATIVE
Ketones, ur: NEGATIVE
Leukocytes, UA: NEGATIVE
Nitrite: NEGATIVE
Specific Gravity, Urine: 1.01 (ref 1.000–1.030)
Urobilinogen, UA: 0.2 (ref 0.0–1.0)
pH: 6 (ref 5.0–8.0)

## 2011-10-15 LAB — LIPID PANEL
Cholesterol: 169 mg/dL (ref 0–200)
LDL Cholesterol: 92 mg/dL (ref 0–99)
VLDL: 27.2 mg/dL (ref 0.0–40.0)

## 2011-10-15 LAB — BASIC METABOLIC PANEL
BUN: 28 mg/dL — ABNORMAL HIGH (ref 6–23)
CO2: 29 mEq/L (ref 19–32)
Chloride: 106 mEq/L (ref 96–112)
Glucose, Bld: 84 mg/dL (ref 70–99)
Potassium: 4.8 mEq/L (ref 3.5–5.1)
Sodium: 141 mEq/L (ref 135–145)

## 2011-10-15 LAB — HEPATIC FUNCTION PANEL
ALT: 22 U/L (ref 0–53)
AST: 20 U/L (ref 0–37)
Albumin: 3.4 g/dL — ABNORMAL LOW (ref 3.5–5.2)
Alkaline Phosphatase: 100 U/L (ref 39–117)
Total Protein: 6.4 g/dL (ref 6.0–8.3)

## 2011-10-15 LAB — TSH: TSH: 0.93 u[IU]/mL (ref 0.35–5.50)

## 2011-10-15 MED ORDER — METHYLPREDNISOLONE ACETATE PF 80 MG/ML IJ SUSP
120.0000 mg | Freq: Once | INTRAMUSCULAR | Status: AC
  Administered 2011-10-15: 120 mg via INTRAMUSCULAR

## 2011-10-15 MED ORDER — PNEUMOCOCCAL VAC POLYVALENT 25 MCG/0.5ML IJ INJ: 0.5000 mL | INJECTION | Freq: Once | INTRAMUSCULAR | Status: DC

## 2011-10-15 MED ORDER — FLUTICASONE PROPIONATE 50 MCG/ACT NA SUSP: 2.0000 | Freq: Every day | NASAL | Status: DC

## 2011-10-15 NOTE — Progress Notes (Signed)
Subjective:    Patient ID: Troy Palmer, male    DOB: March 27, 1946, 66 y.o.   MRN: [id number]  HPI Here for wellness and f/u;  Overall doing ok;  Pt denies CP, worsening SOB, DOE, wheezing, orthopnea, PND, worsening LE edema, palpitations, dizziness or syncope.  Pt denies neurological change such as new Headache, facial or extremity weakness.  Pt denies polydipsia, polyuria, or low sugar symptoms. Pt states overall good compliance with treatment and medications, good tolerability, and trying to follow lower cholesterol diet.  Pt denies worsening depressive symptoms, suicidal ideation or panic. No fever, wt loss, night sweats, loss of appetite, or other constitutional symptoms.  Pt states good ability with ADL's, low fall risk, home safety reviewed and adequate, no significant changes in hearing or vision, and occasionally active with exercise.  Does have nocturia up to 5 times per night, has not been back to urology since nov 2011 for BPH, no hx of DM, OAB or other GU related  Does have several wks ongoing nasal allergy symptoms with clear congestion, itch and sneeze, without fever, pain, ST, cough or wheezing, would like shot and flonase today.  Using allegra but not helping enough Past Medical History  Diagnosis Date  . TB SKIN TEST, POSITIVE 03/28/2007  . SLEEP APNEA, OBSTRUCTIVE, MODERATE 04/09/2007  . RENAL INSUFFICIENCY 03/15/2010  . PVD WITH CLAUDICATION 02/10/2010  . Polymyalgia rheumatica 07/26/2008  . POLYARTHRALGIA 07/13/2008  . Other dysphagia 11/21/2009  . Osteoarth NOS-Unspec 03/28/2007  . LUNG NODULE 12/04/2007  . LOW BACK PAIN 04/09/2007  . LEG PAIN, BILATERAL 01/25/2010  . HYPERTENSION 03/28/2007  . HYPERLIPIDEMIA 12/04/2007  . HEMORRHOIDS, RECURRENT 02/11/2008  . GERD 12/04/2007  . ESOPHAGEAL STRICTURE 05/26/2008  . ERECTILE DYSFUNCTION 04/09/2007  . DYSPHAGIA UNSPECIFIED 03/16/2008  . DIVERTICULOSIS, COLON 12/04/2007  . DEPRESSION 12/04/2007  . Degeneration of cervical intervertebral disc  03/28/2007  . COLONIC POLYPS, HX OF 12/04/2007  . CONSTIPATION 09/25/2010  . CEREBROVASCULAR ACCIDENT, HX OF 07/17/2010  . BENIGN PROSTATIC HYPERTROPHY 04/09/2007  . BACK PAIN 09/16/2009  . ASTHMA 12/04/2007  . ALLERGIC RHINITIS 04/09/2007   Past Surgical History  Procedure Date  . Total knee arthroplasty     x 2  . Rotator cuff repair     Left    reports that he has quit smoking. He does not have any smokeless tobacco history on file. He reports that he does not drink alcohol or use illicit drugs. family history includes Asthma in his other; Heart disease in his father; Hypertension in his brother; and Lupus in his brother. Allergies  Allergen Reactions  . Ace Inhibitors Swelling    Angioedema throat   Current Outpatient Prescriptions on File Prior to Visit  Medication Sig Dispense Refill  . atorvastatin (LIPITOR) 40 MG tablet Take 1 tablet (40 mg total) by mouth daily.  90 tablet  3  . bisacodyl (BISACODYL) 5 MG EC tablet Take 10 mg by mouth at bedtime as needed. For constipation      . famotidine (PEPCID) 20 MG tablet Take 1 tablet (20 mg total) by mouth 2 (two) times daily.  10 tablet  0  . hydrochlorothiazide (MICROZIDE) 12.5 MG capsule Take 12.5 mg by mouth daily.        Marland Kitchen lactulose (CHRONULAC) 10 GM/15ML solution       . nebivolol (BYSTOLIC) 10 MG tablet Take 1 tablet (10 mg total) by mouth daily.  30 tablet  11  . predniSONE (DELTASONE) 10 MG tablet Take 2 tablets (20  mg total) by mouth daily.  10 tablet  0  . Tamsulosin HCl (FLOMAX) 0.4 MG CAPS take 1 capsule by mouth once daily  90 capsule  3   No current facility-administered medications on file prior to visit.   Review of Systems Review of Systems  Constitutional: Negative for diaphoresis, activity change, appetite change and unexpected weight change.  HENT: Negative for hearing loss, ear pain, facial swelling, mouth sores and neck stiffness.   Eyes: Negative for pain, redness and visual disturbance.  Respiratory: Negative  for shortness of breath and wheezing.   Cardiovascular: Negative for chest pain and palpitations.  Gastrointestinal: Negative for diarrhea, blood in stool, abdominal distention and rectal pain.  Genitourinary: Negative for hematuria, flank pain and decreased urine volume.  Musculoskeletal: Negative for myalgias and joint swelling.  Skin: Negative for color change and wound.  Neurological: Negative for syncope and numbness.  Hematological: Negative for adenopathy.  Psychiatric/Behavioral: Negative for hallucinations, self-injury, decreased concentration and agitation.      Objective:   Physical Exam BP 112/72  Pulse 57  Temp(Src) 97 F (36.1 C) (Oral)  Ht 5\' 10"  (1.778 m)  Wt 281 lb 12 oz (127.801 kg)  BMI 40.43 kg/m2  SpO2 98% Physical Exam  VS noted Constitutional: Pt is oriented to person, place, and time. Appears well-developed and well-nourished.  HENT:  Head: Normocephalic and atraumatic.  Right Ear: External ear normal.  Left Ear: External ear normal.  Nose: Nose normal.  Mouth/Throat: Oropharynx is clear and moist.  Bilat tm's mild erythema.  Sinus nontender.  Pharynx mild erythema Eyes: Conjunctivae and EOM are normal. Pupils are equal, round, and reactive to light.  Neck: Normal range of motion. Neck supple. No JVD present. No tracheal deviation present.  Cardiovascular: Normal rate, regular rhythm, normal heart sounds and intact distal pulses.   Pulmonary/Chest: Effort normal and breath sounds normal.  Abdominal: Soft. Bowel sounds are normal. There is no tenderness.  Musculoskeletal: Normal range of motion. Exhibits no edema.  Lymphadenopathy:  Has no cervical adenopathy.  Neurological: Pt is alert and oriented to person, place, and time. Pt has normal reflexes. No cranial nerve deficit.  Skin: Skin is warm and dry. No rash noted.  Psychiatric:  Has  normal mood and affect. Behavior is normal.     Assessment & Plan:

## 2011-10-15 NOTE — Assessment & Plan Note (Signed)
bph with nocturia 5 times per night - to cont flomax, refer back to Dr Tannenbaum/uroloyg, last seen nov 2011

## 2011-10-15 NOTE — Assessment & Plan Note (Signed)
Overall doing well, age appropriate education and counseling updated, referrals for preventative services and immunizations addressed, dietary and smoking counseling addressed, most recent labs and ECG reviewed.  I have personally reviewed and have noted: 1) the patient's medical and social history 2) The pt's use of alcohol, tobacco, and illicit drugs 3) The patient's current medications and supplements 4) Functional ability including ADL's, fall risk, home safety risk, hearing and visual impairment 5) Diet and physical activities 6) Evidence for depression or mood disorder 7) The patient's height, weight, and BMI have been recorded in the chart I have made referrals, and provided counseling and education based on review of the above For pneumovax as he is due

## 2011-10-15 NOTE — Patient Instructions (Addendum)
You had the pneumonia shot today You had the steroid shot today You will be contacted regarding the referral for: Urology - Dr Gaynelle Arabian Take all new medications as prescribed - the generic flonase nasal spray Continue all other medications as before; please have the pharmacy call if you need refills Please go to LAB in the [facility] for the blood and/or urine tests to be done today Please call the phone number [phone] (the [facility]) for results of testing in 2-3 days;  When calling, simply dial the number, and when prompted enter the MRN number above (the Medical Record Number) and the # key, then the message should start. Please return in 6 mo with Lab testing done 3-5 days before

## 2011-10-15 NOTE — Assessment & Plan Note (Signed)
Mild to mod, for depomedrol IM, and flonase asd  course,  to f/u any worsening symptoms or concerns

## 2011-11-15 ENCOUNTER — Ambulatory Visit: Payer: PRIVATE HEALTH INSURANCE | Admitting: Internal Medicine

## 2011-11-21 ENCOUNTER — Encounter: Payer: Self-pay | Admitting: Internal Medicine

## 2011-11-21 ENCOUNTER — Ambulatory Visit (INDEPENDENT_AMBULATORY_CARE_PROVIDER_SITE_OTHER): Payer: PRIVATE HEALTH INSURANCE | Admitting: Internal Medicine

## 2011-11-21 VITALS — BP 132/82 | HR 48 | Temp 97.3°F | Ht 69.5 in | Wt 284.1 lb

## 2011-11-21 DIAGNOSIS — I10 Essential (primary) hypertension: Secondary | ICD-10-CM

## 2011-11-21 DIAGNOSIS — R1310 Dysphagia, unspecified: Secondary | ICD-10-CM | POA: Insufficient documentation

## 2011-11-21 DIAGNOSIS — R109 Unspecified abdominal pain: Secondary | ICD-10-CM

## 2011-11-21 MED ORDER — PANTOPRAZOLE SODIUM 40 MG PO TBEC: 40.0000 mg | DELAYED_RELEASE_TABLET | Freq: Every day | ORAL | Status: DC

## 2011-11-21 NOTE — Assessment & Plan Note (Signed)
With hx of egd with dilation ; for PPI and refer Dr Deatra Ina, ? Needs repeat EGD

## 2011-11-21 NOTE — Patient Instructions (Signed)
No further blood work needed today Take all new medications as prescribed - the generic for Protonix (for stomach acid) You will be contacted regarding the referral for: abdomen ultrasound You will be contacted regarding the referral for: Dr Kaplan/GI as you may need a "scope" for the trouble swallowing and the abd pain

## 2011-11-21 NOTE — Progress Notes (Signed)
Subjective:    Patient ID: Troy Palmer, male    DOB: [birth year], 66 y.o.   MRN: [id number]  HPI  Here with 2 wks onset mild abd discomfort, gas, bloating to the upper abd, some radiation to the back, some ongoing constipation, no n/v, fever, CT feb 2012 neg for acute noted, and Denies worsening reflux, n/v,  or blood but has had some mild dysphagia to water intermittent over the last several months.  Occas hiccups as well.  Using the Ab machine at home for exercise, also mentions some intermittent ankle swelling later in the day off and on  -ongoing stable.  Pt denies chest pain, increased sob or doe, wheezing, orthopnea, PND, increased LE swelling, palpitations, dizziness or syncope.  Pt denies new neurological symptoms such as new headache, or facial or extremity weakness or numbness  Pt denies polydipsia, polyuria. Past Medical History  Diagnosis Date  . TB SKIN TEST, POSITIVE 03/28/2007  . SLEEP APNEA, OBSTRUCTIVE, MODERATE 04/09/2007  . RENAL INSUFFICIENCY 03/15/2010  . PVD WITH CLAUDICATION 02/10/2010  . Polymyalgia rheumatica 07/26/2008  . POLYARTHRALGIA 07/13/2008  . Other dysphagia 11/21/2009  . Osteoarth NOS-Unspec 03/28/2007  . LUNG NODULE 12/04/2007  . LOW BACK PAIN 04/09/2007  . LEG PAIN, BILATERAL 01/25/2010  . HYPERTENSION 03/28/2007  . HYPERLIPIDEMIA 12/04/2007  . HEMORRHOIDS, RECURRENT 02/11/2008  . GERD 12/04/2007  . ESOPHAGEAL STRICTURE 05/26/2008  . ERECTILE DYSFUNCTION 04/09/2007  . DYSPHAGIA UNSPECIFIED 03/16/2008  . DIVERTICULOSIS, COLON 12/04/2007  . DEPRESSION 12/04/2007  . Degeneration of cervical intervertebral disc 03/28/2007  . COLONIC POLYPS, HX OF 12/04/2007  . CONSTIPATION 09/25/2010  . CEREBROVASCULAR ACCIDENT, HX OF 07/17/2010  . BENIGN PROSTATIC HYPERTROPHY 04/09/2007  . BACK PAIN 09/16/2009  . ASTHMA 12/04/2007  . ALLERGIC RHINITIS 04/09/2007   Past Surgical History  Procedure Date  . Total knee arthroplasty     x 2  . Rotator cuff repair     Left    reports  that he has quit smoking. He does not have any smokeless tobacco history on file. He reports that he does not drink alcohol or use illicit drugs. family history includes Asthma in his other; Heart disease in his father; Hypertension in his brother; and Lupus in his brother. Allergies  Allergen Reactions  . Ace Inhibitors Swelling    Angioedema throat   Current Outpatient Prescriptions on File Prior to Visit  Medication Sig Dispense Refill  . atorvastatin (LIPITOR) 40 MG tablet Take 1 tablet (40 mg total) by mouth daily.  90 tablet  3  . bisacodyl (BISACODYL) 5 MG EC tablet Take 10 mg by mouth at bedtime as needed. For constipation      . famotidine (PEPCID) 20 MG tablet Take 1 tablet (20 mg total) by mouth 2 (two) times daily.  10 tablet  0  . fluticasone (FLONASE) 50 MCG/ACT nasal spray Place 2 sprays into the nose daily.  16 g  2  . hydrochlorothiazide (MICROZIDE) 12.5 MG capsule Take 12.5 mg by mouth daily.        Marland Kitchen lactulose (CHRONULAC) 10 GM/15ML solution       . nebivolol (BYSTOLIC) 10 MG tablet Take 1 tablet (10 mg total) by mouth daily.  30 tablet  11  . Tamsulosin HCl (FLOMAX) 0.4 MG CAPS take 1 capsule by mouth once daily  90 capsule  3  . pantoprazole (PROTONIX) 40 MG tablet Take 1 tablet (40 mg total) by mouth daily.  90 tablet  3  . predniSONE (DELTASONE) 10  MG tablet Take 2 tablets (20 mg total) by mouth daily.  10 tablet  0   Current Facility-Administered Medications on File Prior to Visit  Medication Dose Route Frequency Provider Last Rate Last Dose  . pneumococcal 23 valent vaccine (PNU-IMMUNE) injection 0.5 mL  0.5 mL Intramuscular Once Biagio Borg, MD       Review of Systems Review of Systems  Constitutional: Negative for diaphoresis and unexpected weight change.  Gastrointestinal: Negative for vomiting and blood in stool.  Genitourinary: Negative for hematuria and decreased urine volume.  Musculoskeletal: Negative for gait problem.  Skin: Negative for color change  and wound.  Neurological: Negative for tremors and numbness.  Psychiatric/Behavioral: Negative for decreased concentration. The patient is not hyperactive.      Objective:   Physical Exam BP 132/82  Pulse 48  Temp(Src) 97.3 F (36.3 C) (Oral)  Ht 5' 9.5" (1.765 m)  Wt 284 lb 2 oz (128.878 kg)  BMI 41.36 kg/m2  SpO2 98% Physical Exam  VS noted Constitutional: Pt appears well-developed and well-nourished.  HENT: Head: Normocephalic.  Right Ear: External ear normal.  Left Ear: External ear normal.  Eyes: Conjunctivae and EOM are normal. Pupils are equal, round, and reactive to light.  Neck: Normal range of motion. Neck supple.  Cardiovascular: Normal rate and regular rhythm.   Pulmonary/Chest: Effort normal and breath sounds normal.  Abd:  Soft, NT, non-distended, + BS, no guarding or rebound, no hernia; benign exam Neurological: Pt is alert. No motor/gait deficit Skin: Skin is warm. No erythema.  Psychiatric: Pt behavior is normal. Thought content normal.     Assessment & Plan:

## 2011-11-23 ENCOUNTER — Other Ambulatory Visit: Payer: PRIVATE HEALTH INSURANCE

## 2011-11-25 ENCOUNTER — Encounter: Payer: Self-pay | Admitting: Internal Medicine

## 2011-11-25 NOTE — Assessment & Plan Note (Signed)
stable overall by hx and exam, most recent data reviewed with pt, and pt to continue medical treatment as before BP Readings from Last 3 Encounters:  11/21/11 132/82  10/15/11 112/72  09/24/11 120/72

## 2011-11-25 NOTE — Assessment & Plan Note (Addendum)
Etiology uncleasr, ? Functional complaints,  Doubt med related, recent labs reviewed with pt, would hold on further imaging except for abd u/s

## 2011-11-26 ENCOUNTER — Other Ambulatory Visit: Payer: PRIVATE HEALTH INSURANCE

## 2011-11-27 ENCOUNTER — Ambulatory Visit
Admission: RE | Admit: 2011-11-27 | Discharge: 2011-11-27 | Disposition: A | Discharge status: Home / Self Care | Payer: PRIVATE HEALTH INSURANCE | Source: Ambulatory Visit | Attending: Internal Medicine | Admitting: Internal Medicine

## 2011-11-27 ENCOUNTER — Encounter: Payer: Self-pay | Admitting: Internal Medicine

## 2011-11-27 DIAGNOSIS — R109 Unspecified abdominal pain: Secondary | ICD-10-CM

## 2011-12-05 ENCOUNTER — Emergency Department (HOSPITAL_COMMUNITY)
Admission: EM | Admit: 2011-12-05 | Discharge: 2011-12-05 | Disposition: A | Discharge status: Home / Self Care | Payer: PRIVATE HEALTH INSURANCE | Attending: Emergency Medicine | Admitting: Emergency Medicine

## 2011-12-05 ENCOUNTER — Emergency Department (HOSPITAL_COMMUNITY): Payer: PRIVATE HEALTH INSURANCE

## 2011-12-05 ENCOUNTER — Encounter (HOSPITAL_COMMUNITY): Payer: Self-pay | Admitting: *Deleted

## 2011-12-05 DIAGNOSIS — E669 Obesity, unspecified: Secondary | ICD-10-CM | POA: Insufficient documentation

## 2011-12-05 DIAGNOSIS — F3289 Other specified depressive episodes: Secondary | ICD-10-CM | POA: Insufficient documentation

## 2011-12-05 DIAGNOSIS — J3489 Other specified disorders of nose and nasal sinuses: Secondary | ICD-10-CM | POA: Insufficient documentation

## 2011-12-05 DIAGNOSIS — J45909 Unspecified asthma, uncomplicated: Secondary | ICD-10-CM | POA: Insufficient documentation

## 2011-12-05 DIAGNOSIS — R079 Chest pain, unspecified: Secondary | ICD-10-CM | POA: Insufficient documentation

## 2011-12-05 DIAGNOSIS — E785 Hyperlipidemia, unspecified: Secondary | ICD-10-CM | POA: Insufficient documentation

## 2011-12-05 DIAGNOSIS — IMO0001 Reserved for inherently not codable concepts without codable children: Secondary | ICD-10-CM | POA: Insufficient documentation

## 2011-12-05 DIAGNOSIS — K219 Gastro-esophageal reflux disease without esophagitis: Secondary | ICD-10-CM | POA: Insufficient documentation

## 2011-12-05 DIAGNOSIS — R509 Fever, unspecified: Secondary | ICD-10-CM | POA: Insufficient documentation

## 2011-12-05 DIAGNOSIS — Z79899 Other long term (current) drug therapy: Secondary | ICD-10-CM | POA: Insufficient documentation

## 2011-12-05 DIAGNOSIS — R51 Headache: Secondary | ICD-10-CM | POA: Insufficient documentation

## 2011-12-05 DIAGNOSIS — Z8673 Personal history of transient ischemic attack (TIA), and cerebral infarction without residual deficits: Secondary | ICD-10-CM | POA: Insufficient documentation

## 2011-12-05 DIAGNOSIS — F329 Major depressive disorder, single episode, unspecified: Secondary | ICD-10-CM | POA: Insufficient documentation

## 2011-12-05 DIAGNOSIS — I10 Essential (primary) hypertension: Secondary | ICD-10-CM | POA: Insufficient documentation

## 2011-12-05 DIAGNOSIS — R059 Cough, unspecified: Secondary | ICD-10-CM | POA: Insufficient documentation

## 2011-12-05 DIAGNOSIS — M549 Dorsalgia, unspecified: Secondary | ICD-10-CM | POA: Insufficient documentation

## 2011-12-05 DIAGNOSIS — M791 Myalgia, unspecified site: Secondary | ICD-10-CM

## 2011-12-05 DIAGNOSIS — R05 Cough: Secondary | ICD-10-CM | POA: Insufficient documentation

## 2011-12-05 LAB — CBC
Hemoglobin: 14.6 g/dL (ref 13.0–17.0)
Platelets: 202 10*3/uL (ref 150–400)
RBC: 5.4 MIL/uL (ref 4.22–5.81)
WBC: 6.1 10*3/uL (ref 4.0–10.5)

## 2011-12-05 LAB — COMPREHENSIVE METABOLIC PANEL
ALT: 20 U/L (ref 0–53)
AST: 18 U/L (ref 0–37)
Alkaline Phosphatase: 107 U/L (ref 39–117)
CO2: 28 mEq/L (ref 19–32)
Calcium: 9.5 mg/dL (ref 8.4–10.5)
Chloride: 104 mEq/L (ref 96–112)
GFR calc non Af Amer: 42 mL/min — ABNORMAL LOW (ref >90)
Potassium: 4.3 mEq/L (ref 3.5–5.1)
Sodium: 141 mEq/L (ref 135–145)

## 2011-12-05 LAB — URINALYSIS, ROUTINE W REFLEX MICROSCOPIC
Glucose, UA: NEGATIVE mg/dL
Hgb urine dipstick: NEGATIVE
Leukocytes, UA: NEGATIVE
Protein, ur: 100 mg/dL — AB
Specific Gravity, Urine: 1.012 (ref 1.005–1.030)
pH: 6 (ref 5.0–8.0)

## 2011-12-05 LAB — DIFFERENTIAL
Lymphocytes Relative: 17 % (ref 12–46)
Lymphs Abs: 1.1 10*3/uL (ref 0.7–4.0)
Monocytes Relative: 16 % — ABNORMAL HIGH (ref 3–12)
Neutro Abs: 4 10*3/uL (ref 1.7–7.7)
Neutrophils Relative %: 65 % (ref 43–77)

## 2011-12-05 LAB — URINE MICROSCOPIC-ADD ON

## 2011-12-05 MED ORDER — OXYCODONE-ACETAMINOPHEN 5-325 MG PO TABS: 2.0000 | ORAL_TABLET | ORAL | Status: AC | PRN

## 2011-12-05 MED ORDER — OXYCODONE-ACETAMINOPHEN 5-325 MG PO TABS
1.0000 | ORAL_TABLET | Freq: Once | ORAL | Status: AC
  Administered 2011-12-05: 1 via ORAL
  Filled 2011-12-05: qty 1

## 2011-12-05 NOTE — Discharge Instructions (Signed)
A chest x-ray does not show pneumonia.  Your blood tests, and urine tests do not show significant illness.  Use ibuprofen 600 mg every 6 hours for pain.  Use Percocet for more severe pain.  Followup with your Dr. if your symptoms.  Last more than 3-4 days.

## 2011-12-05 NOTE — ED Notes (Signed)
Pt c/o generalized all over body aches x1 day, woke yesterday am w/a headache, reports hx of seasonal allergies, and sob while lying flat

## 2011-12-05 NOTE — ED Notes (Signed)
Patient transported to X-ray 

## 2011-12-05 NOTE — ED Notes (Signed)
Reports waking up yesterday with headache, chills and generalized body pain. Denies any n/v/d.

## 2011-12-05 NOTE — ED Provider Notes (Signed)
History     CSN: [id number]  Arrival date & time 12/05/11  1500   First MD Initiated Contact with Patient 12/05/11 1617      Chief Complaint  Patient presents with  . Fever  . Generalized Body Aches    (Consider location/radiation/quality/duration/timing/severity/associated sxs/prior treatment) Patient is a 66 y.o. male presenting with fever. The history is provided by the patient.  Fever Primary symptoms of the febrile illness include fever, headaches, cough and myalgias. Primary symptoms do not include shortness of breath, abdominal pain, vomiting, dysuria or rash.   the patient is a 66 year old, male, who presents to emergency department complaining of fever, and myalgias.  For 2 days.  He says that he has a headache, congestion, sinus pressure in his chest and back ache.  He has had a cough with productive sputum.  He denies a sore throat, or ear aches.  He denies nausea, vomiting, diarrhea, or urinary tract symptoms.  He does not smoke.  Past Medical History  Diagnosis Date  . TB SKIN TEST, POSITIVE 03/28/2007  . SLEEP APNEA, OBSTRUCTIVE, MODERATE 04/09/2007  . RENAL INSUFFICIENCY 03/15/2010  . PVD WITH CLAUDICATION 02/10/2010  . Polymyalgia rheumatica 07/26/2008  . POLYARTHRALGIA 07/13/2008  . Other dysphagia 11/21/2009  . Osteoarth NOS-Unspec 03/28/2007  . LUNG NODULE 12/04/2007  . LOW BACK PAIN 04/09/2007  . LEG PAIN, BILATERAL 01/25/2010  . HYPERTENSION 03/28/2007  . HYPERLIPIDEMIA 12/04/2007  . HEMORRHOIDS, RECURRENT 02/11/2008  . GERD 12/04/2007  . ESOPHAGEAL STRICTURE 05/26/2008  . ERECTILE DYSFUNCTION 04/09/2007  . DYSPHAGIA UNSPECIFIED 03/16/2008  . DIVERTICULOSIS, COLON 12/04/2007  . DEPRESSION 12/04/2007  . Degeneration of cervical intervertebral disc 03/28/2007  . COLONIC POLYPS, HX OF 12/04/2007  . CONSTIPATION 09/25/2010  . CEREBROVASCULAR ACCIDENT, HX OF 07/17/2010  . BENIGN PROSTATIC HYPERTROPHY 04/09/2007  . BACK PAIN 09/16/2009  . ASTHMA 12/04/2007  . ALLERGIC RHINITIS  04/09/2007    Past Surgical History  Procedure Date  . Total knee arthroplasty     x 2  . Rotator cuff repair     Left    Family History  Problem Relation Age of Onset  . Heart disease Father   . Lupus Brother   . Hypertension Brother   . Asthma Other     History  Substance Use Topics  . Smoking status: Former Research scientist (life sciences)  . Smokeless tobacco: Not on file  . Alcohol Use: No      Review of Systems  Constitutional: Positive for fever and chills. Negative for diaphoresis.  HENT: Positive for congestion and sinus pressure.   Respiratory: Positive for cough. Negative for chest tightness and shortness of breath.   Cardiovascular: Positive for chest pain.       Bilateral chest aching  Gastrointestinal: Negative for vomiting and abdominal pain.  Genitourinary: Negative for dysuria.  Musculoskeletal: Positive for myalgias.  Skin: Negative for rash.  Neurological: Positive for headaches.  Psychiatric/Behavioral: Negative for confusion.  All other systems reviewed and are negative.    Allergies  Ace inhibitors  Home Medications   Current Outpatient Rx  Name Route Sig Dispense Refill  . ATORVASTATIN CALCIUM 40 MG PO TABS Oral Take 1 tablet (40 mg total) by mouth daily. 90 tablet 3  . BISACODYL 5 MG PO TBEC Oral Take 10 mg by mouth at bedtime as needed. For constipation    . FAMOTIDINE 20 MG PO TABS Oral Take 1 tablet (20 mg total) by mouth 2 (two) times daily. 10 tablet 0  . FLUTICASONE PROPIONATE 50 MCG/ACT  NA SUSP Nasal Place 2 sprays into the nose daily. 16 g 2  . HYDROCHLOROTHIAZIDE 12.5 MG PO CAPS Oral Take 12.5 mg by mouth daily.      Marland Kitchen LACTULOSE 10 GM/15ML PO SOLN      . NEBIVOLOL HCL 10 MG PO TABS Oral Take 1 tablet (10 mg total) by mouth daily. 30 tablet 11  . TAMSULOSIN HCL 0.4 MG PO CAPS  take 1 capsule by mouth once daily 90 capsule 3    BP 137/73  Pulse 63  Temp(Src) 99.2 F (37.3 C) (Oral)  Resp 18  SpO2 99%  Physical Exam  Nursing note and vitals  reviewed. Constitutional: He is oriented to person, place, and time. He appears well-developed and well-nourished.       Obese  HENT:  Head: Normocephalic and atraumatic.  Mouth/Throat: Oropharynx is clear and moist.  Eyes: Conjunctivae and EOM are normal.  Neck: Normal range of motion. Neck supple.  Cardiovascular: Normal rate.   No murmur heard. Pulmonary/Chest: Effort normal and breath sounds normal. No respiratory distress.  Abdominal: Soft. He exhibits no distension. There is no tenderness.  Musculoskeletal: Normal range of motion. He exhibits no edema and no tenderness.  Neurological: He is alert and oriented to person, place, and time. No cranial nerve deficit.  Psychiatric: He has a normal mood and affect. Thought content normal.    ED Course  Procedures (including critical care time)   Labs Reviewed  CBC  DIFFERENTIAL  URINALYSIS, ROUTINE W REFLEX MICROSCOPIC  COMPREHENSIVE METABOLIC PANEL  CK   No results found.   No diagnosis found.    MDM  Myalgias        Barbara Cower, MD 12/05/11 1740

## 2011-12-17 ENCOUNTER — Other Ambulatory Visit: Payer: Self-pay | Admitting: Internal Medicine

## 2011-12-17 MED ORDER — PREDNISONE 10 MG PO TABS: ORAL_TABLET | ORAL | Status: DC

## 2011-12-17 NOTE — Telephone Encounter (Signed)
Pt requesting gout med--barely can walk--[facility] [address]--pt ph# [phone]

## 2011-12-17 NOTE — Telephone Encounter (Signed)
Called the patient left a detailed message prescription has been sent to pharmacy.

## 2011-12-17 NOTE — Telephone Encounter (Signed)
I can do predpack, but can only give pain med with OV - done per emr

## 2011-12-26 ENCOUNTER — Ambulatory Visit: Payer: PRIVATE HEALTH INSURANCE | Admitting: Gastroenterology

## 2012-01-15 ENCOUNTER — Emergency Department (HOSPITAL_COMMUNITY)
Admission: EM | Admit: 2012-01-15 | Discharge: 2012-01-15 | Disposition: A | Discharge status: Home / Self Care | Payer: PRIVATE HEALTH INSURANCE | Attending: Emergency Medicine | Admitting: Emergency Medicine

## 2012-01-15 ENCOUNTER — Encounter (HOSPITAL_COMMUNITY): Payer: Self-pay | Admitting: *Deleted

## 2012-01-15 DIAGNOSIS — I10 Essential (primary) hypertension: Secondary | ICD-10-CM | POA: Insufficient documentation

## 2012-01-15 DIAGNOSIS — E785 Hyperlipidemia, unspecified: Secondary | ICD-10-CM | POA: Insufficient documentation

## 2012-01-15 DIAGNOSIS — I70219 Atherosclerosis of native arteries of extremities with intermittent claudication, unspecified extremity: Secondary | ICD-10-CM | POA: Insufficient documentation

## 2012-01-15 DIAGNOSIS — R609 Edema, unspecified: Secondary | ICD-10-CM | POA: Insufficient documentation

## 2012-01-15 NOTE — ED Provider Notes (Signed)
History     CSN: [id number]  Arrival date & time 01/15/12  30   First MD Initiated Contact with Patient 01/15/12 1636      Chief Complaint  Patient presents with  . Foot Swelling    (Consider location/radiation/quality/duration/timing/severity/associated sxs/prior treatment) HPI Comments: Patient reports that he has intermittent bilateral lower extremity edema from the mid shin distally since 2009.  He reports that this episode of edema has been present for 2 weeks.  He has not tried any interventions or elevated his legs.  He denies any erythema or warmth.  He does have a history of renal insufficiency.  No history of CHF.  He had a CXR done in April, which did not show any signs of heart failure. He denies any PND, orthopnea, DOE, or SOB.   He has a PCP Dr. Ina Kick.    The history is provided by the patient.    Past Medical History  Diagnosis Date  . TB SKIN TEST, POSITIVE 03/28/2007  . SLEEP APNEA, OBSTRUCTIVE, MODERATE 04/09/2007  . RENAL INSUFFICIENCY 03/15/2010  . PVD WITH CLAUDICATION 02/10/2010  . Polymyalgia rheumatica 07/26/2008  . POLYARTHRALGIA 07/13/2008  . Other dysphagia 11/21/2009  . Osteoarth NOS-Unspec 03/28/2007  . LUNG NODULE 12/04/2007  . LOW BACK PAIN 04/09/2007  . LEG PAIN, BILATERAL 01/25/2010  . HYPERTENSION 03/28/2007  . HYPERLIPIDEMIA 12/04/2007  . HEMORRHOIDS, RECURRENT 02/11/2008  . GERD 12/04/2007  . ESOPHAGEAL STRICTURE 05/26/2008  . ERECTILE DYSFUNCTION 04/09/2007  . DYSPHAGIA UNSPECIFIED 03/16/2008  . DIVERTICULOSIS, COLON 12/04/2007  . DEPRESSION 12/04/2007  . Degeneration of cervical intervertebral disc 03/28/2007  . COLONIC POLYPS, HX OF 12/04/2007  . CONSTIPATION 09/25/2010  . CEREBROVASCULAR ACCIDENT, HX OF 07/17/2010  . BENIGN PROSTATIC HYPERTROPHY 04/09/2007  . BACK PAIN 09/16/2009  . ASTHMA 12/04/2007  . ALLERGIC RHINITIS 04/09/2007    Past Surgical History  Procedure Date  . Total knee arthroplasty     x 2  . Rotator cuff repair     Left     Family History  Problem Relation Age of Onset  . Heart disease Father   . Lupus Brother   . Hypertension Brother   . Asthma Other     History  Substance Use Topics  . Smoking status: Former Research scientist (life sciences)  . Smokeless tobacco: Not on file  . Alcohol Use: No      Review of Systems  Constitutional: Negative for fever and chills.  Respiratory: Negative for cough and shortness of breath.   Cardiovascular: Positive for leg swelling. Negative for chest pain.  Gastrointestinal: Negative for abdominal distention.  Genitourinary: Negative for decreased urine volume.  Musculoskeletal: Negative for joint swelling and gait problem.  Skin: Negative for color change and wound.    Allergies  Ace inhibitors  Home Medications   Current Outpatient Rx  Name Route Sig Dispense Refill  . ATORVASTATIN CALCIUM 40 MG PO TABS Oral Take 1 tablet (40 mg total) by mouth daily. 90 tablet 3  . BISACODYL 5 MG PO TBEC Oral Take 10 mg by mouth at bedtime as needed. For constipation    . FLUTICASONE PROPIONATE 50 MCG/ACT NA SUSP Nasal Place 2 sprays into the nose daily. 16 g 2  . HYDROCHLOROTHIAZIDE 12.5 MG PO CAPS Oral Take 12.5 mg by mouth daily.      . NEBIVOLOL HCL 10 MG PO TABS Oral Take 1 tablet (10 mg total) by mouth daily. 30 tablet 11  . TAMSULOSIN HCL 0.4 MG PO CAPS  take 1 capsule  by mouth once daily 90 capsule 3    BP 166/64  Pulse 73  Temp 98.1 F (36.7 C)  Resp 18  SpO2 100%  Physical Exam  Nursing note and vitals reviewed. Constitutional: He appears well-developed and well-nourished. No distress.  HENT:  Head: Normocephalic.  Mouth/Throat: Oropharynx is clear and moist.  Neck: Normal range of motion. Neck supple. No JVD present.  Cardiovascular: Normal rate, regular rhythm, normal heart sounds and intact distal pulses.        1+ pitting edema bilaterally from the mid shin distally  Pulmonary/Chest: Effort normal and breath sounds normal. No respiratory distress. He has no  wheezes. He has no rales.  Musculoskeletal: Normal range of motion.  Neurological: He is alert. No sensory deficit. Gait normal.  Skin: Skin is warm and dry. He is not diaphoretic. No erythema.  Psychiatric: He has a normal mood and affect.    ED Course  Procedures (including critical care time)  Labs Reviewed - No data to display No results found.   1. Edema, peripheral       MDM  Patient presenting with intermittent bilateral peripheral edema for the past 2 years.  Patient had a recent CXR done in April which did not show any signs of CHF.  Patient denies PND, orthopnea, DOE, or SOB at this time.  No JVD on exam.  Therefore, do not feel that edema is caused by heart failure.  Patient does have a history of renal insufficiency.  However, feel that edema is most likely due to venous insufficiency.  Patient instructed to wear compression stockings and elevate feet.  Patient also instructed to follow up with PCP.        Sherlyn Lees Turin, PA-C 01/16/12 [phone]

## 2012-01-15 NOTE — Discharge Instructions (Signed)
Peripheral Edema You have swelling in your legs (peripheral edema). This swelling is due to excess accumulation of salt and water in your body. Edema may be a sign of heart, kidney or liver disease, or a side effect of a medication. It may also be due to problems in the leg veins. Elevating your legs and using special support stockings may be very helpful, if the cause of the swelling is due to poor venous circulation. It is important for you to go to a medical supply store to get compression stockings to wear.  Avoid long periods of standing, whatever the cause. Treatment of edema depends on identifying the cause. Chips, pretzels, pickles and other salty foods should be avoided. Restricting salt in your diet is almost always needed. Water pills (diuretics) are often used to remove the excess salt and water from your body via urine. These medicines prevent the kidney from reabsorbing sodium. This increases urine flow. Diuretic treatment may also result in lowering of potassium levels in your body. Potassium supplements may be needed if you have to use diuretics daily. Daily weights can help you keep track of your progress in clearing your edema. You should call your caregiver for follow up care as recommended. SEEK IMMEDIATE MEDICAL CARE IF:   You have increased swelling, pain, redness, or heat in your legs.   You develop shortness of breath, especially when lying down.   You develop chest or abdominal pain, weakness, or fainting.   You have a fever.  Document Released: 09/06/2004 Document Revised: 07/19/2011 Document Reviewed: 08/17/2009 [facility] Patient Information 2012 [employer].

## 2012-01-15 NOTE — ED Notes (Signed)
Pt reports bilateral foot and ankle swelling and pain. Sts has been going on for "a while, comes and goes; pain in tops of feet and heels." Has been seen for this before and was told it may be gout.

## 2012-01-16 ENCOUNTER — Ambulatory Visit (INDEPENDENT_AMBULATORY_CARE_PROVIDER_SITE_OTHER): Payer: PRIVATE HEALTH INSURANCE | Admitting: Internal Medicine

## 2012-01-16 ENCOUNTER — Encounter: Payer: Self-pay | Admitting: Internal Medicine

## 2012-01-16 VITALS — BP 124/72 | HR 48 | Temp 97.7°F | Ht 70.0 in | Wt 280.1 lb

## 2012-01-16 DIAGNOSIS — R609 Edema, unspecified: Secondary | ICD-10-CM

## 2012-01-16 DIAGNOSIS — N189 Chronic kidney disease, unspecified: Secondary | ICD-10-CM

## 2012-01-16 DIAGNOSIS — I10 Essential (primary) hypertension: Secondary | ICD-10-CM

## 2012-01-16 DIAGNOSIS — M79605 Pain in left leg: Secondary | ICD-10-CM

## 2012-01-16 DIAGNOSIS — M79609 Pain in unspecified limb: Secondary | ICD-10-CM

## 2012-01-16 MED ORDER — FUROSEMIDE 20 MG PO TABS: 20.0000 mg | ORAL_TABLET | Freq: Every day | ORAL | Status: DC

## 2012-01-16 NOTE — Patient Instructions (Addendum)
OK to stop the hydrochlorothiazide 12.5 mg (HCTZ , fluid pill) Please start the lasix 20 mg per day (new fluid pill) Continue all other medications as before Please have the pharmacy call with any refills you may need. Please call if you feel you need something to try such as viagra You will be contacted regarding the referral for: Leg artery test (Lower extremity dopplers to check circulation - it does not hurt) Please go to [facility] on [address] for a pair of the strongest below the knee compression stockings they have to wear to keep the swelling down, and leg elevation as well as low salt diet helps as well Check your wt at home every morning Please return in 4 wks to re-check with blood work done before

## 2012-01-16 NOTE — Assessment & Plan Note (Signed)
With known CKD somewhat worse on the right chronic related to knee swelling s/p right TKR;  To change the hctz to lasix 20 as he find it difficult to take due to ED;  F/u next visit with labs including cr and K

## 2012-01-16 NOTE — ED Provider Notes (Signed)
Medical screening examination/treatment/procedure(s) were performed by non-physician practitioner and as supervising physician I was immediately available for consultation/collaboration.    Kathalene Frames, MD 01/16/12 [phone]

## 2012-01-19 ENCOUNTER — Encounter: Payer: Self-pay | Admitting: Internal Medicine

## 2012-01-19 DIAGNOSIS — M79604 Pain in right leg: Secondary | ICD-10-CM | POA: Insufficient documentation

## 2012-01-19 DIAGNOSIS — M79605 Pain in left leg: Secondary | ICD-10-CM | POA: Insufficient documentation

## 2012-01-19 NOTE — Assessment & Plan Note (Signed)
For LE art dopplers,  to f/u any worsening symptoms or concerns

## 2012-01-19 NOTE — Assessment & Plan Note (Addendum)
stable overall by hx and exam, most recent data reviewed with pt, and pt to continue medical treatment as before except for stopping the hct, to f/u BP closely BP Readings from Last 3 Encounters:  01/16/12 124/72  01/15/12 166/64  12/05/11 137/73

## 2012-01-19 NOTE — Assessment & Plan Note (Signed)
For f/u lab today, consider renal f/u

## 2012-01-19 NOTE — Progress Notes (Signed)
Subjective:    Patient ID: Troy Palmer, male    DOB: 1946/08/01, 66 y.o.   MRN: [id number]  HPI  Here to f/u with recent worsening LE edema, seen in ER June 4, asked to wear compression stocking and elev legs and f/u here;  Not taking HCTZ for 3 days - reluctant to take at all b/c seems to cause ED symtpoms,  Has right knee effusion making RLE swelling mild worse than LLE,  Does incidentally have several months RLE calf pain with ambulation as well.  Pt denies chest pain, increased sob or doe, wheezing, orthopnea, PND, increased LE swelling, palpitations, dizziness or syncope except for the above.  Pt denies new neurological symptoms such as new headache, or facial or extremity weakness or numbness   Pt denies polydipsia, polyuria.  Pt denies fever, wt loss, night sweats, loss of appetite, or other constitutional symptoms Past Medical History  Diagnosis Date  . TB SKIN TEST, POSITIVE 03/28/2007  . SLEEP APNEA, OBSTRUCTIVE, MODERATE 04/09/2007  . RENAL INSUFFICIENCY 03/15/2010  . PVD WITH CLAUDICATION 02/10/2010  . Polymyalgia rheumatica 07/26/2008  . POLYARTHRALGIA 07/13/2008  . Other dysphagia 11/21/2009  . Osteoarth NOS-Unspec 03/28/2007  . LUNG NODULE 12/04/2007  . LOW BACK PAIN 04/09/2007  . LEG PAIN, BILATERAL 01/25/2010  . HYPERTENSION 03/28/2007  . HYPERLIPIDEMIA 12/04/2007  . HEMORRHOIDS, RECURRENT 02/11/2008  . GERD 12/04/2007  . ESOPHAGEAL STRICTURE 05/26/2008  . ERECTILE DYSFUNCTION 04/09/2007  . DYSPHAGIA UNSPECIFIED 03/16/2008  . DIVERTICULOSIS, COLON 12/04/2007  . DEPRESSION 12/04/2007  . Degeneration of cervical intervertebral disc 03/28/2007  . COLONIC POLYPS, HX OF 12/04/2007  . CONSTIPATION 09/25/2010  . CEREBROVASCULAR ACCIDENT, HX OF 07/17/2010  . BENIGN PROSTATIC HYPERTROPHY 04/09/2007  . BACK PAIN 09/16/2009  . ASTHMA 12/04/2007  . ALLERGIC RHINITIS 04/09/2007   Past Surgical History  Procedure Date  . Total knee arthroplasty     x 2  . Rotator cuff repair     Left    reports  that he has quit smoking. He does not have any smokeless tobacco history on file. He reports that he does not drink alcohol or use illicit drugs. family history includes Asthma in his other; Heart disease in his father; Hypertension in his brother; and Lupus in his brother. Allergies  Allergen Reactions  . Ace Inhibitors Swelling    Angioedema throat   Current Outpatient Prescriptions on File Prior to Visit  Medication Sig Dispense Refill  . atorvastatin (LIPITOR) 40 MG tablet Take 1 tablet (40 mg total) by mouth daily.  90 tablet  3  . bisacodyl (BISACODYL) 5 MG EC tablet Take 10 mg by mouth at bedtime as needed. For constipation      . fluticasone (FLONASE) 50 MCG/ACT nasal spray Place 2 sprays into the nose daily.  16 g  2  . nebivolol (BYSTOLIC) 10 MG tablet Take 1 tablet (10 mg total) by mouth daily.  30 tablet  11  . Tamsulosin HCl (FLOMAX) 0.4 MG CAPS take 1 capsule by mouth once daily  90 capsule  3  . furosemide (LASIX) 20 MG tablet Take 1 tablet (20 mg total) by mouth daily.  90 tablet  3   Review of Systems Review of Systems  Constitutional: Negative for diaphoresis and unexpected weight change. .   Eyes: Negative for photophobia and visual disturbance.  Respiratory: Negative for choking and stridor.   Gastrointestinal: Negative for vomiting and blood in stool.  Genitourinary: Negative for hematuria and decreased urine volume.  Musculo;  Right knee pain mild Skin: Negative for color change and wound.  Neurological: Negative for tremors and numbness.  Psychiatric/Behavioral: Negative for decreased concentration. The patient is not hyperactive.      Objective:   Physical Exam BP 124/72  Pulse 48  Temp(Src) 97.7 F (36.5 C) (Oral)  Ht 5\' 10"  (1.778 m)  Wt 280 lb 2 oz (127.064 kg)  BMI 40.19 kg/m2  SpO2 98% Physical Exam  VS noted Constitutional: Pt appears well-developed and well-nourished.  HENT: Head: Normocephalic.  Right Ear: External ear normal.  Left Ear:  External ear normal.  Eyes: Conjunctivae and EOM are normal. Pupils are equal, round, and reactive to light.  Neck: Normal range of motion. Neck supple.  Cardiovascular: Normal rate and regular rhythm.   Pulmonary/Chest: Effort normal and breath sounds normal.  Abd:  Soft, NT, non-distended, + BS Neurological: Pt is alert. Not confused Right knee: 1+ effusion Skin: Skin is warm. No erythema. trace to 1+ edema bilat right > left, dorsalis pedis trace bilat  Psychiatric: Pt behavior is normal. Thought content normal.     Assessment & Plan:

## 2012-01-30 ENCOUNTER — Other Ambulatory Visit: Payer: Self-pay | Admitting: Cardiology

## 2012-01-30 DIAGNOSIS — M79609 Pain in unspecified limb: Secondary | ICD-10-CM

## 2012-01-31 ENCOUNTER — Encounter (INDEPENDENT_AMBULATORY_CARE_PROVIDER_SITE_OTHER): Payer: PRIVATE HEALTH INSURANCE

## 2012-01-31 DIAGNOSIS — I739 Peripheral vascular disease, unspecified: Secondary | ICD-10-CM

## 2012-01-31 DIAGNOSIS — M79609 Pain in unspecified limb: Secondary | ICD-10-CM

## 2012-02-04 ENCOUNTER — Encounter: Payer: Self-pay | Admitting: Internal Medicine

## 2012-02-08 ENCOUNTER — Other Ambulatory Visit: Payer: Self-pay | Admitting: Internal Medicine

## 2012-02-12 ENCOUNTER — Emergency Department (HOSPITAL_COMMUNITY): Payer: Medicare PPO

## 2012-02-12 ENCOUNTER — Encounter (HOSPITAL_COMMUNITY): Payer: Self-pay | Admitting: Emergency Medicine

## 2012-02-12 ENCOUNTER — Emergency Department (HOSPITAL_COMMUNITY)
Admission: EM | Admit: 2012-02-12 | Discharge: 2012-02-12 | Disposition: A | Discharge status: Home / Self Care | Payer: Medicare PPO | Attending: Emergency Medicine | Admitting: Emergency Medicine

## 2012-02-12 DIAGNOSIS — R071 Chest pain on breathing: Secondary | ICD-10-CM | POA: Insufficient documentation

## 2012-02-12 DIAGNOSIS — Z79899 Other long term (current) drug therapy: Secondary | ICD-10-CM | POA: Insufficient documentation

## 2012-02-12 DIAGNOSIS — R0789 Other chest pain: Secondary | ICD-10-CM

## 2012-02-12 DIAGNOSIS — R079 Chest pain, unspecified: Secondary | ICD-10-CM | POA: Insufficient documentation

## 2012-02-12 DIAGNOSIS — I10 Essential (primary) hypertension: Secondary | ICD-10-CM | POA: Insufficient documentation

## 2012-02-12 DIAGNOSIS — R0602 Shortness of breath: Secondary | ICD-10-CM | POA: Insufficient documentation

## 2012-02-12 LAB — COMPREHENSIVE METABOLIC PANEL
AST: 20 U/L (ref 0–37)
CO2: 25 mEq/L (ref 19–32)
Calcium: 8.8 mg/dL (ref 8.4–10.5)
Creatinine, Ser: 1.68 mg/dL — ABNORMAL HIGH (ref 0.50–1.35)
GFR calc Af Amer: 48 mL/min — ABNORMAL LOW (ref >90)
GFR calc non Af Amer: 41 mL/min — ABNORMAL LOW (ref >90)
Glucose, Bld: 99 mg/dL (ref 70–99)

## 2012-02-12 LAB — CBC WITH DIFFERENTIAL/PLATELET
Basophils Absolute: 0 10*3/uL (ref 0.0–0.1)
Eosinophils Relative: 4 % (ref 0–5)
HCT: 38.7 % — ABNORMAL LOW (ref 39.0–52.0)
Lymphocytes Relative: 48 % — ABNORMAL HIGH (ref 12–46)
MCH: 26.9 pg (ref 26.0–34.0)
MCHC: 32.3 g/dL (ref 30.0–36.0)
MCV: 83.4 fL (ref 78.0–100.0)
Monocytes Absolute: 0.7 10*3/uL (ref 0.1–1.0)
RDW: 13.6 % (ref 11.5–15.5)
WBC: 4.5 10*3/uL (ref 4.0–10.5)

## 2012-02-12 LAB — POCT I-STAT TROPONIN I: Troponin i, poc: 0 ng/mL (ref 0.00–0.08)

## 2012-02-12 MED ORDER — LORAZEPAM 2 MG/ML IJ SOLN
1.0000 mg | Freq: Once | INTRAMUSCULAR | Status: AC
Start: 2012-02-12 — End: 2012-02-12
  Administered 2012-02-12: 1 mg via INTRAVENOUS
  Filled 2012-02-12: qty 1

## 2012-02-12 MED ORDER — SODIUM CHLORIDE 0.9 % IV SOLN
Freq: Once | INTRAVENOUS | Status: AC
  Administered 2012-02-12: 03:00:00 via INTRAVENOUS

## 2012-02-12 MED ORDER — METHOCARBAMOL 750 MG PO TABS: 750.0000 mg | ORAL_TABLET | Freq: Four times a day (QID) | ORAL | Status: AC

## 2012-02-12 NOTE — ED Notes (Signed)
Patient reports that he has had midsternal chest pain all day. He reports that he was unable to get control the pain and came in toonight

## 2012-02-12 NOTE — ED Notes (Signed)
Pt c/o substernal left sided chest pain since Monday morning rating 7/10 after exercising; states pain has remained constant; no shortness of breath or nausea; skin warm and dry; tel sinus brady rate 48.  States pain worse with exercise. Oxygen at 2l/min per [place] started.  Dr Zenia Resides in with patient

## 2012-02-12 NOTE — ED Provider Notes (Signed)
History     CSN: [id number]  Arrival date & time 02/12/12  0210   First MD Initiated Contact with Patient 02/12/12 0243      Chief Complaint  Patient presents with  . Chest Pain    (Consider location/radiation/quality/duration/timing/severity/associated sxs/prior treatment) Patient is a 66 y.o. male presenting with chest pain. The history is provided by the patient.  Chest Pain    patient here with intermittent chest pain x1 day localized beneath the left breast and described as sharp. No associated dyspnea or diaphoresis. No recent fever or chills. Pain is worse with certain positions. History of similar symptoms associated with costochondritis. Patient was able to do his normal gym workout routine without any change in his symptoms. No medications taken for this prior to arrival. No rashes noted. Pain is nonradiating.  Past Medical History  Diagnosis Date  . TB SKIN TEST, POSITIVE 03/28/2007  . SLEEP APNEA, OBSTRUCTIVE, MODERATE 04/09/2007  . RENAL INSUFFICIENCY 03/15/2010  . PVD WITH CLAUDICATION 02/10/2010  . Polymyalgia rheumatica 07/26/2008  . POLYARTHRALGIA 07/13/2008  . Other dysphagia 11/21/2009  . Osteoarth NOS-Unspec 03/28/2007  . LUNG NODULE 12/04/2007  . LOW BACK PAIN 04/09/2007  . LEG PAIN, BILATERAL 01/25/2010  . HYPERTENSION 03/28/2007  . HYPERLIPIDEMIA 12/04/2007  . HEMORRHOIDS, RECURRENT 02/11/2008  . GERD 12/04/2007  . ESOPHAGEAL STRICTURE 05/26/2008  . ERECTILE DYSFUNCTION 04/09/2007  . DYSPHAGIA UNSPECIFIED 03/16/2008  . DIVERTICULOSIS, COLON 12/04/2007  . DEPRESSION 12/04/2007  . Degeneration of cervical intervertebral disc 03/28/2007  . COLONIC POLYPS, HX OF 12/04/2007  . CONSTIPATION 09/25/2010  . CEREBROVASCULAR ACCIDENT, HX OF 07/17/2010  . BENIGN PROSTATIC HYPERTROPHY 04/09/2007  . BACK PAIN 09/16/2009  . ASTHMA 12/04/2007  . ALLERGIC RHINITIS 04/09/2007    Past Surgical History  Procedure Date  . Total knee arthroplasty     x 2  . Rotator cuff repair     Left     Family History  Problem Relation Age of Onset  . Heart disease Father   . Lupus Brother   . Hypertension Brother   . Asthma Other     History  Substance Use Topics  . Smoking status: Former Research scientist (life sciences)  . Smokeless tobacco: Not on file  . Alcohol Use: No      Review of Systems  Cardiovascular: Positive for chest pain.  All other systems reviewed and are negative.    Allergies  Ace inhibitors  Home Medications   Current Outpatient Rx  Name Route Sig Dispense Refill  . ATORVASTATIN CALCIUM 40 MG PO TABS Oral Take 1 tablet (40 mg total) by mouth daily. 90 tablet 3  . BISACODYL 5 MG PO TBEC Oral Take 10 mg by mouth at bedtime as needed. For constipation    . FLUTICASONE PROPIONATE 50 MCG/ACT NA SUSP Nasal Place 2 sprays into the nose daily. 16 g 2  . FUROSEMIDE 20 MG PO TABS Oral Take 1 tablet (20 mg total) by mouth daily. 90 tablet 3  . NEBIVOLOL HCL 10 MG PO TABS Oral Take 1 tablet (10 mg total) by mouth daily. 30 tablet 11  . TAMSULOSIN HCL 0.4 MG PO CAPS  take 1 capsule by mouth once daily 90 capsule 3    BP 198/63  Pulse 50  Temp 98.6 F (37 C) (Oral)  Resp 16  SpO2 100%  Physical Exam  Nursing note and vitals reviewed. Constitutional: He is oriented to person, place, and time. He appears well-developed and well-nourished.  Non-toxic appearance. No distress.  HENT:  Head: Normocephalic and atraumatic.  Eyes: Conjunctivae, EOM and lids are normal. Pupils are equal, round, and reactive to light.  Neck: Normal range of motion. Neck supple. No tracheal deviation present. No mass present.  Cardiovascular: Normal rate, regular rhythm and normal heart sounds.  Exam reveals no gallop.   No murmur heard. Pulmonary/Chest: Effort normal and breath sounds normal. No stridor. No respiratory distress. He has no decreased breath sounds. He has no wheezes. He has no rhonchi. He has no rales. He exhibits tenderness. He exhibits no crepitus.    Abdominal: Soft. Normal  appearance and bowel sounds are normal. He exhibits no distension. There is no tenderness. There is no rebound and no CVA tenderness.  Musculoskeletal: Normal range of motion. He exhibits no edema and no tenderness.  Neurological: He is alert and oriented to person, place, and time. He has normal strength. No cranial nerve deficit or sensory deficit. GCS eye subscore is 4. GCS verbal subscore is 5. GCS motor subscore is 6.  Skin: Skin is warm and dry. No abrasion and no rash noted.  Psychiatric: He has a normal mood and affect. His speech is normal and behavior is normal.    ED Course  Procedures (including critical care time)   Labs Reviewed  CBC WITH DIFFERENTIAL  COMPREHENSIVE METABOLIC PANEL  LIPASE, BLOOD   No results found.   No diagnosis found.    MDM   Date: 02/12/2012  Rate: 47  Rhythm: sinus bradycardia  QRS Axis: normal  Intervals: normal  ST/T Wave abnormalities: early repolarization  Conduction Disutrbances:none  Narrative Interpretation:   Old EKG Reviewed: unchanged from 04/2007   4:00 AM Pt given ativan and feels better, pt with chest wall pain, no concern for acs, will d/c      Leota Jacobsen, MD 02/12/12 0400

## 2012-02-12 NOTE — Discharge Instructions (Signed)
Chest Wall Pain Chest wall pain is pain in or around the bones and muscles of your chest. It may take up to 6 weeks to get better. It may take longer if you must stay physically active in your work and activities.  CAUSES  Chest wall pain may happen on its own. However, it may be caused by:  A viral illness like the flu.   Injury.   Coughing.   Exercise.   Arthritis.   Fibromyalgia.   Shingles.  HOME CARE INSTRUCTIONS   Avoid overtiring physical activity. Try not to strain or perform activities that cause pain. This includes any activities using your chest or your abdominal and side muscles, especially if heavy weights are used.   Put ice on the sore area.   Put ice in a plastic bag.   Place a towel between your skin and the bag.   Leave the ice on for 15 to 20 minutes per hour while awake for the first 2 days.   Only take over-the-counter or prescription medicines for pain, discomfort, or fever as directed by your caregiver.  SEEK IMMEDIATE MEDICAL CARE IF:   Your pain increases, or you are very uncomfortable.   You have a fever.   Your chest pain becomes worse.   You have new, unexplained symptoms.   You have nausea or vomiting.   You feel sweaty or lightheaded.   You have a cough with phlegm (sputum), or you cough up blood.  MAKE SURE YOU:   Understand these instructions.   Will watch your condition.   Will get help right away if you are not doing well or get worse.  Document Released: 07/30/2005 Document Revised: 07/19/2011 Document Reviewed: 03/26/2011 [employer] Patient Information 2012 [employer]. 

## 2012-02-20 ENCOUNTER — Encounter: Payer: Self-pay | Admitting: Internal Medicine

## 2012-02-20 ENCOUNTER — Ambulatory Visit (INDEPENDENT_AMBULATORY_CARE_PROVIDER_SITE_OTHER): Payer: Medicare PPO | Admitting: Internal Medicine

## 2012-02-20 VITALS — BP 122/80 | HR 56 | Temp 97.0°F | Ht 70.0 in | Wt 283.5 lb

## 2012-02-20 DIAGNOSIS — I739 Peripheral vascular disease, unspecified: Secondary | ICD-10-CM

## 2012-02-20 DIAGNOSIS — R079 Chest pain, unspecified: Secondary | ICD-10-CM

## 2012-02-20 DIAGNOSIS — M79671 Pain in right foot: Secondary | ICD-10-CM | POA: Insufficient documentation

## 2012-02-20 DIAGNOSIS — M79609 Pain in unspecified limb: Secondary | ICD-10-CM

## 2012-02-20 DIAGNOSIS — J309 Allergic rhinitis, unspecified: Secondary | ICD-10-CM

## 2012-02-20 MED ORDER — MOMETASONE FUROATE 50 MCG/ACT NA SUSP: 2.0000 | Freq: Every day | NASAL | Status: DC

## 2012-02-20 MED ORDER — METHYLPREDNISOLONE ACETATE 80 MG/ML IJ SUSP
120.0000 mg | Freq: Once | INTRAMUSCULAR | Status: AC
  Administered 2012-02-20: 120 mg via INTRAMUSCULAR

## 2012-02-20 NOTE — Patient Instructions (Addendum)
You had the steroid shot today Take all new medications as prescribed - the nasonex at 2 spray/side per day OK to stop the flonase Continue all other medications as before You can also try Allegra OTC for allergies if the nasonex is not enough

## 2012-02-24 ENCOUNTER — Encounter: Payer: Self-pay | Admitting: Internal Medicine

## 2012-02-24 DIAGNOSIS — R079 Chest pain, unspecified: Secondary | ICD-10-CM | POA: Insufficient documentation

## 2012-02-24 NOTE — Assessment & Plan Note (Signed)
Exam ok, but suspect may need orthotics - for podiatry referral

## 2012-02-24 NOTE — Progress Notes (Signed)
Subjective:    Patient ID: Troy Palmer, male    DOB: [birth year], 66 y.o.   MRN: [id number]  HPI  Here after being seen in ER July 2 with CP, w/u neg for acute cardiac, no further pain since then.  Today c/o bilat diffuse achy mid foot pain, mild to mod, worse after standing more than 15 min, better to sit, without claudication symptoms , trauma, swelling, fever or hx of same in past.  Does have several wks ongoing nasal allergy symptoms with clear congestion, itch and sneeze, without fever, pain, ST, cough or wheezing.  Pt denies chest pain, increased sob or doe, wheezing, orthopnea, PND, increased LE swelling, palpitations, dizziness or syncope.   Pt denies polydipsia, polyuria.   Pt denies fever, wt loss, night sweats, loss of appetite, or other constitutional symptoms Past Medical History  Diagnosis Date  . TB SKIN TEST, POSITIVE 03/28/2007  . SLEEP APNEA, OBSTRUCTIVE, MODERATE 04/09/2007  . RENAL INSUFFICIENCY 03/15/2010  . PVD WITH CLAUDICATION 02/10/2010  . Polymyalgia rheumatica 07/26/2008  . POLYARTHRALGIA 07/13/2008  . Other dysphagia 11/21/2009  . Osteoarth NOS-Unspec 03/28/2007  . LUNG NODULE 12/04/2007  . LOW BACK PAIN 04/09/2007  . LEG PAIN, BILATERAL 01/25/2010  . HYPERTENSION 03/28/2007  . HYPERLIPIDEMIA 12/04/2007  . HEMORRHOIDS, RECURRENT 02/11/2008  . GERD 12/04/2007  . ESOPHAGEAL STRICTURE 05/26/2008  . ERECTILE DYSFUNCTION 04/09/2007  . DYSPHAGIA UNSPECIFIED 03/16/2008  . DIVERTICULOSIS, COLON 12/04/2007  . DEPRESSION 12/04/2007  . Degeneration of cervical intervertebral disc 03/28/2007  . COLONIC POLYPS, HX OF 12/04/2007  . CONSTIPATION 09/25/2010  . CEREBROVASCULAR ACCIDENT, HX OF 07/17/2010  . BENIGN PROSTATIC HYPERTROPHY 04/09/2007  . BACK PAIN 09/16/2009  . ASTHMA 12/04/2007  . ALLERGIC RHINITIS 04/09/2007   Past Surgical History  Procedure Date  . Total knee arthroplasty     x 2  . Rotator cuff repair     Left    reports that he has quit smoking. He does not have any  smokeless tobacco history on file. He reports that he does not drink alcohol or use illicit drugs. family history includes Asthma in his other; Heart disease in his father; Hypertension in his brother; and Lupus in his brother. Allergies  Allergen Reactions  . Ace Inhibitors Swelling    Angioedema throat   Current Outpatient Prescriptions on File Prior to Visit  Medication Sig Dispense Refill  . atorvastatin (LIPITOR) 40 MG tablet Take 1 tablet (40 mg total) by mouth daily.  90 tablet  3  . bisacodyl (BISACODYL) 5 MG EC tablet Take 10 mg by mouth at bedtime as needed. For constipation      . furosemide (LASIX) 20 MG tablet Take 1 tablet (20 mg total) by mouth daily.  90 tablet  3  . nebivolol (BYSTOLIC) 10 MG tablet Take 1 tablet (10 mg total) by mouth daily.  30 tablet  11  . Tamsulosin HCl (FLOMAX) 0.4 MG CAPS take 1 capsule by mouth once daily  90 capsule  3  . mometasone (NASONEX) 50 MCG/ACT nasal spray Place 2 sprays into the nose daily.  17 g  11    Review of Systems Review of Systems  Constitutional: Negative for diaphoresis and unexpected weight change.  HENT: Negative for drooling and tinnitus.   Eyes: Negative for photophobia and visual disturbance.  Respiratory: Negative for choking and stridor.   Gastrointestinal: Negative for vomiting and blood in stool.  Genitourinary: Negative for hematuria and decreased urine volume.  Musculoskeletal: Negative for other acute joint  pain Skin: Negative for color change and wound.  Neurological: Negative for tremors and numbness.  Psychiatric/Behavioral: Negative for decreased concentration. The patient is not hyperactive.       Objective:   Physical Exam BP 122/80  Pulse 56  Temp 97 F (36.1 C) (Oral)  Ht 5\' 10"  (1.778 m)  Wt 283 lb 8 oz (128.595 kg)  BMI 40.68 kg/m2  SpO2 97% Physical Exam  VS noted, not ill appearing Constitutional: Pt appears well-developed and well-nourished.  HENT: Head: Normocephalic.  Right Ear:  External ear normal.  Left Ear: External ear normal.  Bilat tm's mild erythema.  Sinus nontender.  Pharynx mild erythema Eyes: Conjunctivae and EOM are normal. Pupils are equal, round, and reactive to light.  Neck: Normal range of motion. Neck supple.  Cardiovascular: Normal rate and regular rhythm.   Pulmonary/Chest: Effort normal and breath sounds normal.  Abd:  Soft, NT, non-distended, + BS Neurological: Pt is alert. Not confused, motor/gait intact Bilat feet without tedner, red, swelling to midfeet, and dorsalis pedis 1+ bilat Skin: Skin is warm. No erythema.  Psychiatric: Pt behavior is normal. Thought content normal.     Assessment & Plan:

## 2012-02-24 NOTE — Assessment & Plan Note (Signed)
Change flonase to nasonex and sample given today,  to f/u any worsening symptoms or concerns

## 2012-02-24 NOTE — Assessment & Plan Note (Signed)
Resolved, > MSK, asympt, declines furhter eval at this time, Continue all other medications as before,  to f/u any worsening symptoms or concerns

## 2012-02-24 NOTE — Assessment & Plan Note (Signed)
stable overall by hx and exam, , and pt to continue medical treatment as before   

## 2012-03-15 ENCOUNTER — Other Ambulatory Visit: Payer: Self-pay | Admitting: Internal Medicine

## 2012-04-10 ENCOUNTER — Other Ambulatory Visit: Payer: Self-pay | Admitting: Internal Medicine

## 2012-04-15 ENCOUNTER — Other Ambulatory Visit (INDEPENDENT_AMBULATORY_CARE_PROVIDER_SITE_OTHER): Payer: Medicare PPO

## 2012-04-15 ENCOUNTER — Ambulatory Visit (INDEPENDENT_AMBULATORY_CARE_PROVIDER_SITE_OTHER): Payer: Medicare PPO | Admitting: Internal Medicine

## 2012-04-15 ENCOUNTER — Encounter: Payer: Self-pay | Admitting: Internal Medicine

## 2012-04-15 VITALS — BP 138/60 | HR 51 | Temp 96.6°F | Ht 70.0 in | Wt 284.1 lb

## 2012-04-15 DIAGNOSIS — I10 Essential (primary) hypertension: Secondary | ICD-10-CM

## 2012-04-15 DIAGNOSIS — Z0000 Encounter for general adult medical examination without abnormal findings: Secondary | ICD-10-CM

## 2012-04-15 DIAGNOSIS — R7309 Other abnormal glucose: Secondary | ICD-10-CM

## 2012-04-15 DIAGNOSIS — J309 Allergic rhinitis, unspecified: Secondary | ICD-10-CM

## 2012-04-15 DIAGNOSIS — N189 Chronic kidney disease, unspecified: Secondary | ICD-10-CM

## 2012-04-15 DIAGNOSIS — R7302 Impaired glucose tolerance (oral): Secondary | ICD-10-CM | POA: Insufficient documentation

## 2012-04-15 DIAGNOSIS — E785 Hyperlipidemia, unspecified: Secondary | ICD-10-CM

## 2012-04-15 DIAGNOSIS — R609 Edema, unspecified: Secondary | ICD-10-CM

## 2012-04-15 LAB — BASIC METABOLIC PANEL
CO2: 29 mEq/L (ref 19–32)
Chloride: 107 mEq/L (ref 96–112)
Glucose, Bld: 62 mg/dL — ABNORMAL LOW (ref 70–99)
Potassium: 4.2 mEq/L (ref 3.5–5.1)
Sodium: 141 mEq/L (ref 135–145)

## 2012-04-15 LAB — CBC WITH DIFFERENTIAL/PLATELET
Basophils Absolute: 0 10*3/uL (ref 0.0–0.1)
Eosinophils Absolute: 0.1 10*3/uL (ref 0.0–0.7)
Lymphocytes Relative: 26.1 % (ref 12.0–46.0)
Lymphs Abs: 1.2 10*3/uL (ref 0.7–4.0)
Monocytes Relative: 17.4 % — ABNORMAL HIGH (ref 3.0–12.0)
Platelets: 199 10*3/uL (ref 150.0–400.0)
RDW: 13.5 % (ref 11.5–14.6)

## 2012-04-15 LAB — HEPATIC FUNCTION PANEL
AST: 22 U/L (ref 0–37)
Albumin: 3.3 g/dL — ABNORMAL LOW (ref 3.5–5.2)
Alkaline Phosphatase: 73 U/L (ref 39–117)
Total Protein: 6.6 g/dL (ref 6.0–8.3)

## 2012-04-15 LAB — HEMOGLOBIN A1C: Hgb A1c MFr Bld: 5.7 % (ref 4.6–6.5)

## 2012-04-15 MED ORDER — FUROSEMIDE 20 MG PO TABS: 20.0000 mg | ORAL_TABLET | Freq: Every day | ORAL | Status: DC

## 2012-04-15 MED ORDER — MOMETASONE FUROATE 50 MCG/ACT NA SUSP: 2.0000 | Freq: Every day | NASAL | Status: DC

## 2012-04-15 MED ORDER — ATORVASTATIN CALCIUM 40 MG PO TABS: 40.0000 mg | ORAL_TABLET | Freq: Every day | ORAL | Status: DC

## 2012-04-15 NOTE — Assessment & Plan Note (Signed)
Did not start nasonex after samples last visit but did help , for further samples today and rx

## 2012-04-15 NOTE — Assessment & Plan Note (Signed)
stable overall by hx and exam, most recent data reviewed with pt, and pt to continue medical treatment as before Lab Results  Component Value Date   WBC 4.5 02/12/2012   HGB 12.5* 02/12/2012   HCT 38.7* 02/12/2012   PLT 179 02/12/2012   GLUCOSE 99 02/12/2012   CHOL 169 10/15/2011   TRIG 136.0 10/15/2011   HDL 49.60 10/15/2011   LDLDIRECT 156.9 10/10/2010   LDLCALC 92 10/15/2011   ALT 20 02/12/2012   AST 20 02/12/2012   NA 140 02/12/2012   K 4.0 02/12/2012   CL 107 02/12/2012   CREATININE 1.68* 02/12/2012   BUN 19 02/12/2012   CO2 25 02/12/2012   TSH 0.93 10/15/2011   PSA 0.99 10/15/2011

## 2012-04-15 NOTE — Assessment & Plan Note (Signed)
stable overall by hx and exam, most recent data reviewed with pt, and pt to continue medical treatment as before Lab Results  Component Value Date   LDLCALC 80 04/15/2012

## 2012-04-15 NOTE — Assessment & Plan Note (Signed)
stable overall by hx and exam, and pt to continue medical treatment as before 

## 2012-04-15 NOTE — Progress Notes (Signed)
Subjective:    Patient ID: Troy Palmer, male    DOB: [birth year], 66 y.o.   MRN: [id number]  HPI  Here to f/u; overall doing ok,  Pt denies chest pain, increased sob or doe, wheezing, orthopnea, PND, increased LE swelling, palpitations, dizziness or syncope.  Pt denies new neurological symptoms such as new headache, or facial or extremity weakness or numbness   Pt denies polydipsia, polyuria, or low sugar symptoms such as weakness or confusion improved with po intake.  Pt states overall good compliance with meds, trying to follow lower cholesterol, diabetic diet, wt overall stable but little exercise however  Needs f/u bun/cr as well.  Has some mild memory dysfunction in that he takes longer for recall and is sometimes forgetful, but family has not noticed except daughter said something months ago.  Brother died at 13yo due to lung dz/lupus and had some memory problems as well.  Does have several wks ongoing nasal allergy symptoms with clear congestion, itch and sneeze, without fever, pain, ST, cough or wheezing. Past Medical History  Diagnosis Date  . TB SKIN TEST, POSITIVE 03/28/2007  . SLEEP APNEA, OBSTRUCTIVE, MODERATE 04/09/2007  . RENAL INSUFFICIENCY 03/15/2010  . PVD WITH CLAUDICATION 02/10/2010  . Polymyalgia rheumatica 07/26/2008  . POLYARTHRALGIA 07/13/2008  . Other dysphagia 11/21/2009  . Osteoarth NOS-Unspec 03/28/2007  . LUNG NODULE 12/04/2007  . LOW BACK PAIN 04/09/2007  . LEG PAIN, BILATERAL 01/25/2010  . HYPERTENSION 03/28/2007  . HYPERLIPIDEMIA 12/04/2007  . HEMORRHOIDS, RECURRENT 02/11/2008  . GERD 12/04/2007  . ESOPHAGEAL STRICTURE 05/26/2008  . ERECTILE DYSFUNCTION 04/09/2007  . DYSPHAGIA UNSPECIFIED 03/16/2008  . DIVERTICULOSIS, COLON 12/04/2007  . DEPRESSION 12/04/2007  . Degeneration of cervical intervertebral disc 03/28/2007  . COLONIC POLYPS, HX OF 12/04/2007  . CONSTIPATION 09/25/2010  . CEREBROVASCULAR ACCIDENT, HX OF 07/17/2010  . BENIGN PROSTATIC HYPERTROPHY 04/09/2007  . BACK  PAIN 09/16/2009  . ASTHMA 12/04/2007  . ALLERGIC RHINITIS 04/09/2007   Past Surgical History  Procedure Date  . Total knee arthroplasty     x 2  . Rotator cuff repair     Left    reports that he has quit smoking. He does not have any smokeless tobacco history on file. He reports that he does not drink alcohol or use illicit drugs. family history includes Asthma in his other; Heart disease in his father; Hypertension in his brother; and Lupus in his brother. Allergies  Allergen Reactions  . Ace Inhibitors Swelling    Angioedema throat   Current Outpatient Prescriptions on File Prior to Visit  Medication Sig Dispense Refill  . bisacodyl (BISACODYL) 5 MG EC tablet Take 10 mg by mouth at bedtime as needed. For constipation      . nebivolol (BYSTOLIC) 10 MG tablet Take 1 tablet (10 mg total) by mouth daily.  30 tablet  11  . Tamsulosin HCl (FLOMAX) 0.4 MG CAPS take 1 capsule by mouth once daily  90 capsule  3  . DISCONTD: atorvastatin (LIPITOR) 40 MG tablet take 1 tablet by mouth once daily  90 tablet  3  . DISCONTD: furosemide (LASIX) 20 MG tablet Take 1 tablet (20 mg total) by mouth daily.  90 tablet  3  . DISCONTD: mometasone (NASONEX) 50 MCG/ACT nasal spray Place 2 sprays into the nose daily.  17 g  11   Review of Systems Review of Systems  Constitutional: Negative for diaphoresis and unexpected weight change.  HENT: Negative for  tinnitus.   Eyes: Negative for photophobia  and visual disturbance.  Respiratory: Negative for choking and stridor.   Gastrointestinal: Negative for vomiting and blood in stool.  Genitourinary: Negative for hematuria and decreased urine volume.  Musculoskeletal: Negative for gait problem.  Skin: Negative for color change and wound.  Neurological: Negative for tremors and numbness.  Psychiatric/Behavioral: Negative for decreased concentration. The patient is not hyperactive.      Objective:   Physical Exam BP 138/60  Pulse 51  Temp 96.6 F (35.9 C)  (Oral)  Ht 5\' 10"  (1.778 m)  Wt 284 lb 2 oz (128.878 kg)  BMI 40.77 kg/m2  SpO2 97% Physical Exam  VS noted, not ill appearing Constitutional: Pt appears well-developed and well-nourished.  HENT: Head: Normocephalic.  Right Ear: External ear normal.  Left Ear: External ear normal.  Eyes: Conjunctivae and EOM are normal. Pupils are equal, round, and reactive to light.  Neck: Normal range of motion. Neck supple.  Cardiovascular: Normal rate and regular rhythm.   Pulmonary/Chest: Effort normal and breath sounds normal.  Neurological: Pt is alert. Not confused;  Motor/dtr/gait intact Psychiatric: Pt behavior is normal. Thought content normal.     Assessment & Plan:

## 2012-04-15 NOTE — Patient Instructions (Addendum)
Take all new medications as prescribed - the nasonex Your lipitor and lasix were refilled today Continue all other medications as before Please have the pharmacy call with any refills you may need. You are given the nasonex sample and discount card Please go to LAB in the Basement for the blood and/or urine tests to be done today You will be contacted by phone if any changes need to be made immediately.  Otherwise, you will receive a letter about your results with an explanation. Please return in 6 mo with Lab testing done 3-5 days before

## 2012-04-15 NOTE — Assessment & Plan Note (Signed)
stable overall by hx and exam, most recent data reviewed with pt, and pt to continue medical treatment as before BP Readings from Last 3 Encounters:  04/15/12 138/60  02/20/12 122/80  02/12/12 178/65

## 2012-04-15 NOTE — Assessment & Plan Note (Signed)
stable overall by hx and exam, most recent data reviewed with pt, and pt to continue medical treatment as before Lab Results  Component Value Date   CREATININE 1.7* 04/15/2012

## 2012-05-18 ENCOUNTER — Emergency Department (HOSPITAL_COMMUNITY)
Admission: EM | Admit: 2012-05-18 | Discharge: 2012-05-18 | Discharge status: Against Medical Advice | Payer: Medicare PPO | Attending: Emergency Medicine | Admitting: Emergency Medicine

## 2012-05-18 ENCOUNTER — Emergency Department (HOSPITAL_COMMUNITY): Payer: Medicare PPO

## 2012-05-18 ENCOUNTER — Encounter (HOSPITAL_COMMUNITY): Payer: Self-pay | Admitting: Emergency Medicine

## 2012-05-18 DIAGNOSIS — N400 Enlarged prostate without lower urinary tract symptoms: Secondary | ICD-10-CM | POA: Insufficient documentation

## 2012-05-18 DIAGNOSIS — K219 Gastro-esophageal reflux disease without esophagitis: Secondary | ICD-10-CM | POA: Insufficient documentation

## 2012-05-18 DIAGNOSIS — Z8673 Personal history of transient ischemic attack (TIA), and cerebral infarction without residual deficits: Secondary | ICD-10-CM | POA: Insufficient documentation

## 2012-05-18 DIAGNOSIS — N289 Disorder of kidney and ureter, unspecified: Secondary | ICD-10-CM | POA: Insufficient documentation

## 2012-05-18 DIAGNOSIS — E785 Hyperlipidemia, unspecified: Secondary | ICD-10-CM | POA: Insufficient documentation

## 2012-05-18 DIAGNOSIS — Z87891 Personal history of nicotine dependence: Secondary | ICD-10-CM | POA: Insufficient documentation

## 2012-05-18 DIAGNOSIS — I10 Essential (primary) hypertension: Secondary | ICD-10-CM | POA: Insufficient documentation

## 2012-05-18 DIAGNOSIS — I517 Cardiomegaly: Secondary | ICD-10-CM | POA: Insufficient documentation

## 2012-05-18 DIAGNOSIS — G4733 Obstructive sleep apnea (adult) (pediatric): Secondary | ICD-10-CM | POA: Insufficient documentation

## 2012-05-18 DIAGNOSIS — M47814 Spondylosis without myelopathy or radiculopathy, thoracic region: Secondary | ICD-10-CM | POA: Insufficient documentation

## 2012-05-18 LAB — CBC WITH DIFFERENTIAL/PLATELET
Basophils Absolute: 0 10*3/uL (ref 0.0–0.1)
Basophils Relative: 0 % (ref 0–1)
Hemoglobin: 12.9 g/dL — ABNORMAL LOW (ref 13.0–17.0)
MCHC: 32.7 g/dL (ref 30.0–36.0)
Monocytes Relative: 10 % (ref 3–12)
Neutro Abs: 2.9 10*3/uL (ref 1.7–7.7)
Neutrophils Relative %: 60 % (ref 43–77)
Platelets: 177 10*3/uL (ref 150–400)
RBC: 4.72 MIL/uL (ref 4.22–5.81)

## 2012-05-18 LAB — POCT I-STAT, CHEM 8
Chloride: 106 mEq/L (ref 96–112)
Glucose, Bld: 90 mg/dL (ref 70–99)
HCT: 41 % (ref 39.0–52.0)
Potassium: 4.5 mEq/L (ref 3.5–5.1)
Sodium: 143 mEq/L (ref 135–145)

## 2012-05-18 LAB — POCT I-STAT TROPONIN I: Troponin i, poc: 0 ng/mL (ref 0.00–0.08)

## 2012-05-18 MED ORDER — ONDANSETRON 4 MG PO TBDP
ORAL_TABLET | ORAL | Status: AC
  Administered 2012-05-18: 4 mg
  Filled 2012-05-18: qty 1

## 2012-05-18 NOTE — ED Notes (Signed)
Patient removed IV and walked out EMS doors.

## 2012-05-18 NOTE — ED Notes (Signed)
Pt presents to ed via ptar with c/o of nausea and weakness while driving today. NAD.Marland Kitchen

## 2012-05-18 NOTE — ED Notes (Signed)
While attempting to start IV, PT told staff to unhook all leads, bp cuff, and not start IV. Stated that the holy spirit would take care of him. That he did not want staff to do anything for him a this time. Pt left on monitor and bp cuff on patient.

## 2012-05-19 NOTE — ED Provider Notes (Signed)
History     CSN: [id number]  Arrival date & time 05/18/12  1026   First MD Initiated Contact with Patient 05/18/12 1402      Chief Complaint  Patient presents with  . Weakness    (Consider location/radiation/quality/duration/timing/severity/associated sxs/prior treatment) HPI Comments: Pt is a 66 year old man who had had an episode of nausea and vomiting this morning.  He went to church and was so weak he could not get out of his car.  His pastor called EMS and he was brought to [facility] ED.  He says that he had had some headache, denies chest pain and shortness of breath.  He had felt weak, but there was no paralysis and no difficulty with speech.    Patient is a 66 y.o. male presenting with weakness. The history is provided by the patient and medical records. No language interpreter was used.  Weakness The primary symptoms include headaches, nausea and vomiting. Primary symptoms do not include syncope, loss of consciousness, altered mental status, focal weakness or fever. The symptoms began less than 1 hour ago. The symptoms are improving. The neurological symptoms are diffuse.  The headache is associated with weakness.  Additional symptoms include weakness. Medical issues also include hypertension.    Past Medical History  Diagnosis Date  . TB SKIN TEST, POSITIVE 03/28/2007  . SLEEP APNEA, OBSTRUCTIVE, MODERATE 04/09/2007  . RENAL INSUFFICIENCY 03/15/2010  . PVD WITH CLAUDICATION 02/10/2010  . Polymyalgia rheumatica 07/26/2008  . POLYARTHRALGIA 07/13/2008  . Other dysphagia 11/21/2009  . Osteoarth NOS-Unspec 03/28/2007  . LUNG NODULE 12/04/2007  . LOW BACK PAIN 04/09/2007  . LEG PAIN, BILATERAL 01/25/2010  . HYPERTENSION 03/28/2007  . HYPERLIPIDEMIA 12/04/2007  . HEMORRHOIDS, RECURRENT 02/11/2008  . GERD 12/04/2007  . ESOPHAGEAL STRICTURE 05/26/2008  . ERECTILE DYSFUNCTION 04/09/2007  . DYSPHAGIA UNSPECIFIED 03/16/2008  . DIVERTICULOSIS, COLON 12/04/2007  . DEPRESSION 12/04/2007  .  Degeneration of cervical intervertebral disc 03/28/2007  . COLONIC POLYPS, HX OF 12/04/2007  . CONSTIPATION 09/25/2010  . CEREBROVASCULAR ACCIDENT, HX OF 07/17/2010  . BENIGN PROSTATIC HYPERTROPHY 04/09/2007  . BACK PAIN 09/16/2009  . ASTHMA 12/04/2007  . ALLERGIC RHINITIS 04/09/2007    Past Surgical History  Procedure Date  . Total knee arthroplasty     x 2  . Rotator cuff repair     Left    Family History  Problem Relation Age of Onset  . Heart disease Father   . Lupus Brother   . Hypertension Brother   . Asthma Other     History  Substance Use Topics  . Smoking status: Former Research scientist (life sciences)  . Smokeless tobacco: Not on file  . Alcohol Use: No      Review of Systems  Constitutional: Negative for fever and chills.  Eyes: Negative.   Respiratory: Negative.  Negative for shortness of breath.   Cardiovascular: Negative.  Negative for chest pain, palpitations and syncope.  Gastrointestinal: Positive for nausea and vomiting.  Genitourinary: Negative.   Musculoskeletal: Negative.   Skin: Negative.   Neurological: Positive for weakness and headaches. Negative for focal weakness and loss of consciousness.  Psychiatric/Behavioral: Negative.  Negative for altered mental status.    Allergies  Ace inhibitors  Home Medications   Current Outpatient Rx  Name Route Sig Dispense Refill  . ATORVASTATIN CALCIUM 40 MG PO TABS Oral Take 1 tablet (40 mg total) by mouth daily. 90 tablet 3  . BISACODYL 5 MG PO TBEC Oral Take 10 mg by mouth at bedtime  as needed. For constipation    . FUROSEMIDE 20 MG PO TABS Oral Take 1 tablet (20 mg total) by mouth daily. 90 tablet 3  . MOMETASONE FUROATE 50 MCG/ACT NA SUSP Nasal Place 2 sprays into the nose daily. 17 g 11  . NEBIVOLOL HCL 10 MG PO TABS Oral Take 1 tablet (10 mg total) by mouth daily. 30 tablet 11  . TAMSULOSIN HCL 0.4 MG PO CAPS  take 1 capsule by mouth once daily 90 capsule 3    BP 165/60  Pulse 45  Resp 11  SpO2 94%  Physical Exam    Nursing note and vitals reviewed. Constitutional: He is oriented to person, place, and time. He appears well-developed and well-nourished. No distress.  HENT:  Head: Normocephalic and atraumatic.  Right Ear: External ear normal.  Left Ear: External ear normal.  Mouth/Throat: Oropharynx is clear and moist.  Eyes: Conjunctivae normal and EOM are normal. Pupils are equal, round, and reactive to light. No scleral icterus.  Neck: Normal range of motion. Neck supple.       No carotid bruit.  Cardiovascular: Normal rate, regular rhythm and normal heart sounds.   Pulmonary/Chest: Effort normal and breath sounds normal.  Abdominal: Soft. Bowel sounds are normal.  Musculoskeletal: Normal range of motion. He exhibits no edema and no tenderness.  Neurological: He is alert and oriented to person, place, and time.       No sensory or motor deficit.  Skin: Skin is warm and dry.  Psychiatric: He has a normal mood and affect. His behavior is normal.    ED Course  Procedures (including critical care time)   Results for orders placed during the hospital encounter of 05/18/12  CBC WITH DIFFERENTIAL      Component Value Range   WBC 4.8  4.0 - 10.5 K/uL   RBC 4.72  4.22 - 5.81 MIL/uL   Hemoglobin 12.9 (*) 13.0 - 17.0 g/dL   HCT 39.4  39.0 - 52.0 %   MCV 83.5  78.0 - 100.0 fL   MCH 27.3  26.0 - 34.0 pg   MCHC 32.7  30.0 - 36.0 g/dL   RDW 12.4  11.5 - 15.5 %   Platelets 177  150 - 400 K/uL   Neutrophils Relative 60  43 - 77 %   Neutro Abs 2.9  1.7 - 7.7 K/uL   Lymphocytes Relative 29  12 - 46 %   Lymphs Abs 1.4  0.7 - 4.0 K/uL   Monocytes Relative 10  3 - 12 %   Monocytes Absolute 0.5  0.1 - 1.0 K/uL   Eosinophils Relative 1  0 - 5 %   Eosinophils Absolute 0.1  0.0 - 0.7 K/uL   Basophils Relative 0  0 - 1 %   Basophils Absolute 0.0  0.0 - 0.1 K/uL  POCT I-STAT, CHEM 8      Component Value Range   Sodium 143  135 - 145 mEq/L   Potassium 4.5  3.5 - 5.1 mEq/L   Chloride 106  96 - 112 mEq/L    BUN 18  6 - 23 mg/dL   Creatinine, Ser 1.60 (*) 0.50 - 1.35 mg/dL   Glucose, Bld 90  70 - 99 mg/dL   Calcium, Ion 1.20  1.13 - 1.30 mmol/L   TCO2 26  0 - 100 mmol/L   Hemoglobin 13.9  13.0 - 17.0 g/dL   HCT 41.0  39.0 - 52.0 %  POCT I-STAT TROPONIN I  Component Value Range   Troponin i, poc 0.00  0.00 - 0.08 ng/mL   Comment 3            Dg Chest 2 View  05/18/2012  *RADIOLOGY REPORT*  Clinical Data: Weakness.  Shortness of breath.  Numbness in the fingers.  Hypertension.  CHEST - 2 VIEW  Comparison: 02/12/2012  Findings: Thoracic spondylosis noted.  Mild cardiomegaly is present.  No pulmonary edema.  No pleural effusion observed.  The lungs appear clear.  IMPRESSION:  1.  Mild cardiomegaly. 2.  Thoracic spondylosis.   Original Report Authenticated By: Carron Curie, M.D.     Lab workup and chest x-ray were negative.  EKG ordered.  Pt left before EKG and prior to discharge instruction.     1. Left against medical advice       Mylinda Latina III, MD 05/19/12 [phone]

## 2012-05-22 ENCOUNTER — Emergency Department (HOSPITAL_COMMUNITY)
Admission: EM | Admit: 2012-05-22 | Discharge: 2012-05-22 | Discharge status: Against Medical Advice | Payer: Medicare PPO | Attending: Emergency Medicine | Admitting: Emergency Medicine

## 2012-05-22 ENCOUNTER — Encounter (HOSPITAL_COMMUNITY): Payer: Self-pay | Admitting: *Deleted

## 2012-05-22 ENCOUNTER — Ambulatory Visit: Payer: Medicare PPO | Admitting: Internal Medicine

## 2012-05-22 DIAGNOSIS — Z0289 Encounter for other administrative examinations: Secondary | ICD-10-CM

## 2012-05-22 DIAGNOSIS — R51 Headache: Secondary | ICD-10-CM | POA: Insufficient documentation

## 2012-05-22 NOTE — ED Notes (Signed)
Pt c/o headache since Sunday; chills on and off

## 2012-05-22 NOTE — ED Notes (Signed)
Pt called to triage x 2 without response

## 2012-07-22 ENCOUNTER — Ambulatory Visit (INDEPENDENT_AMBULATORY_CARE_PROVIDER_SITE_OTHER): Payer: 59 | Admitting: Internal Medicine

## 2012-07-22 ENCOUNTER — Encounter: Payer: Self-pay | Admitting: Internal Medicine

## 2012-07-22 VITALS — BP 130/60 | HR 55 | Temp 97.9°F | Resp 12 | Wt 286.1 lb

## 2012-07-22 DIAGNOSIS — R7302 Impaired glucose tolerance (oral): Secondary | ICD-10-CM

## 2012-07-22 DIAGNOSIS — I10 Essential (primary) hypertension: Secondary | ICD-10-CM

## 2012-07-22 DIAGNOSIS — R7309 Other abnormal glucose: Secondary | ICD-10-CM

## 2012-07-22 DIAGNOSIS — M109 Gout, unspecified: Secondary | ICD-10-CM

## 2012-07-22 MED ORDER — ALLOPURINOL 100 MG PO TABS: 100.0000 mg | ORAL_TABLET | Freq: Every day | ORAL | Status: DC

## 2012-07-22 MED ORDER — TRAMADOL HCL 50 MG PO TABS: 50.0000 mg | ORAL_TABLET | Freq: Four times a day (QID) | ORAL | Status: DC | PRN

## 2012-07-22 MED ORDER — PREDNISONE 10 MG PO TABS: ORAL_TABLET | ORAL | Status: DC

## 2012-07-22 NOTE — Assessment & Plan Note (Signed)
Mild to mod, to f/u with onset polys or cbg > 200,  to f/u any worsening symptoms or concerns

## 2012-07-22 NOTE — Patient Instructions (Addendum)
Take all new medications as prescribed - the prednisone, and the pain medication Please start the New Allopurinol 100 mg per day AFTER the prednisone is done Continue all other medications as before

## 2012-07-22 NOTE — Assessment & Plan Note (Signed)
prob given hx, for pain control, predpack asd, start allupurinol after predpack, f/u uric acid next visit

## 2012-07-22 NOTE — Assessment & Plan Note (Signed)
stable overall by hx and exam, most recent data reviewed with pt, and pt to continue medical treatment as before BP Readings from Last 3 Encounters:  07/22/12 130/60  05/22/12 152/66  05/18/12 165/60

## 2012-07-22 NOTE — Progress Notes (Signed)
Subjective:    Patient ID: Troy Palmer, male    DOB: [birth year], 66 y.o.   MRN: [id number]  HPI  Here with 1 wk onset right first MTP area red/sharp pain/swelling on chronic lasix, similar to previous gout, known CKD, no fever/trauma, not on allopurinol or preventive.  Worse to walk, better to sit.  Has actually been on ongoing problem intermittent for 1 yr.  Pt denies chest pain, increased sob or doe, wheezing, orthopnea, PND, increased LE swelling, palpitations, dizziness or syncope.   Pt denies polydipsia, polyuria.  Pt denies new neurological symptoms such as new headache, or facial or extremity weakness or numbness Past Medical History  Diagnosis Date  . TB SKIN TEST, POSITIVE 03/28/2007  . SLEEP APNEA, OBSTRUCTIVE, MODERATE 04/09/2007  . RENAL INSUFFICIENCY 03/15/2010  . PVD WITH CLAUDICATION 02/10/2010  . Polymyalgia rheumatica 07/26/2008  . POLYARTHRALGIA 07/13/2008  . Other dysphagia 11/21/2009  . Osteoarth NOS-Unspec 03/28/2007  . LUNG NODULE 12/04/2007  . LOW BACK PAIN 04/09/2007  . LEG PAIN, BILATERAL 01/25/2010  . HYPERTENSION 03/28/2007  . HYPERLIPIDEMIA 12/04/2007  . HEMORRHOIDS, RECURRENT 02/11/2008  . GERD 12/04/2007  . ESOPHAGEAL STRICTURE 05/26/2008  . ERECTILE DYSFUNCTION 04/09/2007  . DYSPHAGIA UNSPECIFIED 03/16/2008  . DIVERTICULOSIS, COLON 12/04/2007  . DEPRESSION 12/04/2007  . Degeneration of cervical intervertebral disc 03/28/2007  . COLONIC POLYPS, HX OF 12/04/2007  . CONSTIPATION 09/25/2010  . CEREBROVASCULAR ACCIDENT, HX OF 07/17/2010  . BENIGN PROSTATIC HYPERTROPHY 04/09/2007  . BACK PAIN 09/16/2009  . ASTHMA 12/04/2007  . ALLERGIC RHINITIS 04/09/2007   Past Surgical History  Procedure Date  . Total knee arthroplasty     x 2  . Rotator cuff repair     Left    reports that he has quit smoking. He does not have any smokeless tobacco history on file. He reports that he does not drink alcohol or use illicit drugs. family history includes Asthma in his other; Heart disease  in his father; Hypertension in his brother; and Lupus in his brother. Allergies  Allergen Reactions  . Ace Inhibitors Swelling    Angioedema throat   Current Outpatient Prescriptions on File Prior to Visit  Medication Sig Dispense Refill  . atorvastatin (LIPITOR) 40 MG tablet Take 1 tablet (40 mg total) by mouth daily.  90 tablet  3  . bisacodyl (BISACODYL) 5 MG EC tablet Take 10 mg by mouth at bedtime as needed. For constipation      . furosemide (LASIX) 20 MG tablet Take 1 tablet (20 mg total) by mouth daily.  90 tablet  3  . mometasone (NASONEX) 50 MCG/ACT nasal spray Place 2 sprays into the nose daily.  17 g  11  . nebivolol (BYSTOLIC) 10 MG tablet Take 1 tablet (10 mg total) by mouth daily.  30 tablet  11  . Tamsulosin HCl (FLOMAX) 0.4 MG CAPS take 1 capsule by mouth once daily  90 capsule  3  . allopurinol (ZYLOPRIM) 100 MG tablet Take 1 tablet (100 mg total) by mouth daily.  90 tablet  3   Review of Systems  Constitutional: Negative for diaphoresis and unexpected weight change.  HENT: Negative for tinnitus.   Eyes: Negative for photophobia and visual disturbance.  Respiratory: Negative for choking and stridor.   Gastrointestinal: Negative for vomiting and blood in stool.  Genitourinary: Negative for hematuria and decreased urine volume.  Musculoskeletal: Negative for gait problem.  Skin: Negative for color change and wound.  Neurological: Negative for tremors and numbness.  Psychiatric/Behavioral:  Negative for decreased concentration. The patient is not hyperactive.       Objective:   Physical Exam BP 130/60  Pulse 55  Temp 97.9 F (36.6 C) (Oral)  Resp 12  Wt 286 lb 1.9 oz (129.783 kg)  SpO2 96% Physical Exam  VS noted Constitutional: Pt appears well-developed and well-nourished.  HENT: Head: Normocephalic.  Right Ear: External ear normal.  Left Ear: External ear normal.  Eyes: Conjunctivae and EOM are normal. Pupils are equal, round, and reactive to light.  Neck:  Normal range of motion. Neck supple.  Cardiovascular: Normal rate and regular rhythm.   Pulmonary/Chest: Effort normal and breath sounds normal.  Neurological: Pt is alert. Not confused  Skin: Skin is warm. No erythema.  Psychiatric: Pt behavior is normal. Thought content normal.  Right first MTP with 1-2+ red/tender/swelling       Assessment & Plan:

## 2012-07-29 ENCOUNTER — Encounter (HOSPITAL_COMMUNITY): Payer: Self-pay | Admitting: *Deleted

## 2012-07-29 ENCOUNTER — Emergency Department (HOSPITAL_COMMUNITY)
Admission: EM | Admit: 2012-07-29 | Discharge: 2012-07-29 | Disposition: A | Discharge status: Home / Self Care | Payer: PRIVATE HEALTH INSURANCE | Attending: Emergency Medicine | Admitting: Emergency Medicine

## 2012-07-29 DIAGNOSIS — Z8709 Personal history of other diseases of the respiratory system: Secondary | ICD-10-CM | POA: Insufficient documentation

## 2012-07-29 DIAGNOSIS — Z8659 Personal history of other mental and behavioral disorders: Secondary | ICD-10-CM | POA: Insufficient documentation

## 2012-07-29 DIAGNOSIS — Z8719 Personal history of other diseases of the digestive system: Secondary | ICD-10-CM | POA: Insufficient documentation

## 2012-07-29 DIAGNOSIS — J45909 Unspecified asthma, uncomplicated: Secondary | ICD-10-CM | POA: Insufficient documentation

## 2012-07-29 DIAGNOSIS — Z860100 Personal history of colon polyps, unspecified: Secondary | ICD-10-CM | POA: Insufficient documentation

## 2012-07-29 DIAGNOSIS — Z8611 Personal history of tuberculosis: Secondary | ICD-10-CM | POA: Insufficient documentation

## 2012-07-29 DIAGNOSIS — E785 Hyperlipidemia, unspecified: Secondary | ICD-10-CM | POA: Insufficient documentation

## 2012-07-29 DIAGNOSIS — L723 Sebaceous cyst: Secondary | ICD-10-CM

## 2012-07-29 DIAGNOSIS — Z8673 Personal history of transient ischemic attack (TIA), and cerebral infarction without residual deficits: Secondary | ICD-10-CM | POA: Insufficient documentation

## 2012-07-29 DIAGNOSIS — Z8739 Personal history of other diseases of the musculoskeletal system and connective tissue: Secondary | ICD-10-CM | POA: Insufficient documentation

## 2012-07-29 DIAGNOSIS — M542 Cervicalgia: Secondary | ICD-10-CM | POA: Insufficient documentation

## 2012-07-29 DIAGNOSIS — IMO0001 Reserved for inherently not codable concepts without codable children: Secondary | ICD-10-CM | POA: Insufficient documentation

## 2012-07-29 DIAGNOSIS — Z87448 Personal history of other diseases of urinary system: Secondary | ICD-10-CM | POA: Insufficient documentation

## 2012-07-29 DIAGNOSIS — Z79899 Other long term (current) drug therapy: Secondary | ICD-10-CM | POA: Insufficient documentation

## 2012-07-29 DIAGNOSIS — Z87891 Personal history of nicotine dependence: Secondary | ICD-10-CM | POA: Insufficient documentation

## 2012-07-29 DIAGNOSIS — Z8679 Personal history of other diseases of the circulatory system: Secondary | ICD-10-CM | POA: Insufficient documentation

## 2012-07-29 DIAGNOSIS — I10 Essential (primary) hypertension: Secondary | ICD-10-CM | POA: Insufficient documentation

## 2012-07-29 NOTE — ED Notes (Signed)
Pt states tonight he started having pain in the top of his head and felt a knot on the top of his head; knot noted; no drainage; no injury

## 2012-07-29 NOTE — Discharge Instructions (Signed)
Apply warm compresses to your head. Take ibuprofen up to 600mg  every 6 hours for pain. Return with worsening symptoms or if you develop fever and chills.

## 2012-07-29 NOTE — ED Provider Notes (Signed)
History     CSN: [id number]  Arrival date & time 07/29/12  2110   First MD Initiated Contact with Patient 07/29/12 2135      No chief complaint on file.   (Consider location/radiation/quality/duration/timing/severity/associated sxs/prior treatment) HPI Comments: 66 y/o male presents to the ED complaining of a "knot" on the top of his head he noticed earlier today. Symptoms began with pain at the top of his head, and when he felt the area he had a "knot". Denies injury or trauma. No drainage. Rates pain 5/10 radiating down his neck. States knot has decreased in size since he first noticed it. No fever or chills. No known injury or trauma.  The history is provided by the patient.    Past Medical History  Diagnosis Date  . TB SKIN TEST, POSITIVE 03/28/2007  . SLEEP APNEA, OBSTRUCTIVE, MODERATE 04/09/2007  . RENAL INSUFFICIENCY 03/15/2010  . PVD WITH CLAUDICATION 02/10/2010  . Polymyalgia rheumatica 07/26/2008  . POLYARTHRALGIA 07/13/2008  . Other dysphagia 11/21/2009  . Osteoarth NOS-Unspec 03/28/2007  . LUNG NODULE 12/04/2007  . LOW BACK PAIN 04/09/2007  . LEG PAIN, BILATERAL 01/25/2010  . HYPERTENSION 03/28/2007  . HYPERLIPIDEMIA 12/04/2007  . HEMORRHOIDS, RECURRENT 02/11/2008  . GERD 12/04/2007  . ESOPHAGEAL STRICTURE 05/26/2008  . ERECTILE DYSFUNCTION 04/09/2007  . DYSPHAGIA UNSPECIFIED 03/16/2008  . DIVERTICULOSIS, COLON 12/04/2007  . DEPRESSION 12/04/2007  . Degeneration of cervical intervertebral disc 03/28/2007  . COLONIC POLYPS, HX OF 12/04/2007  . CONSTIPATION 09/25/2010  . CEREBROVASCULAR ACCIDENT, HX OF 07/17/2010  . BENIGN PROSTATIC HYPERTROPHY 04/09/2007  . BACK PAIN 09/16/2009  . ASTHMA 12/04/2007  . ALLERGIC RHINITIS 04/09/2007    Past Surgical History  Procedure Date  . Total knee arthroplasty     x 2  . Rotator cuff repair     Left    Family History  Problem Relation Age of Onset  . Heart disease Father   . Lupus Brother   . Hypertension Brother   . Asthma Other      History  Substance Use Topics  . Smoking status: Former Research scientist (life sciences)  . Smokeless tobacco: Not on file  . Alcohol Use: No      Review of Systems  Constitutional: Negative for fever and chills.  HENT: Positive for neck pain.   Musculoskeletal: Positive for myalgias.  Skin:       Positive for "knot" on head    Allergies  Ace inhibitors  Home Medications   Current Outpatient Rx  Name  Route  Sig  Dispense  Refill  . ALLOPURINOL 100 MG PO TABS   Oral   Take 100 mg by mouth daily.         . ATORVASTATIN CALCIUM 40 MG PO TABS   Oral   Take 40 mg by mouth daily.         Marland Kitchen BISACODYL 5 MG PO TBEC   Oral   Take 10 mg by mouth at bedtime as needed. For constipation         . FUROSEMIDE 20 MG PO TABS   Oral   Take 20 mg by mouth daily.         . MOMETASONE FUROATE 50 MCG/ACT NA SUSP   Nasal   Place 2 sprays into the nose daily.         . NEBIVOLOL HCL 10 MG PO TABS   Oral   Take 1 tablet (10 mg total) by mouth daily.   30 tablet   11   .  TAMSULOSIN HCL 0.4 MG PO CAPS                 BP 179/78  Pulse 59  Temp 98.6 F (37 C) (Oral)  Resp 20  Ht 5\' 10"  (1.778 m)  Wt 286 lb (129.729 kg)  BMI 41.04 kg/m2  SpO2 95%  Physical Exam  Nursing note and vitals reviewed. Constitutional: He is oriented to person, place, and time. He appears well-developed. No distress.       Obese  HENT:  Head: Atraumatic.    Eyes: Conjunctivae normal and EOM are normal. Pupils are equal, round, and reactive to light.  Neck: Normal range of motion. Neck supple.  Cardiovascular: Normal rate, regular rhythm and normal heart sounds.   Pulmonary/Chest: Effort normal and breath sounds normal.  Musculoskeletal: Normal range of motion. He exhibits no edema and no tenderness.  Neurological: He is alert and oriented to person, place, and time.  Skin: Skin is warm and dry.  Psychiatric: He has a normal mood and affect. His behavior is normal.    ED Course  Procedures  (including critical care time)  Labs Reviewed - No data to display No results found.   1. Sebaceous cyst       MDM  66 y/o male with soft, mobile, tender mass on top of scalp. No signs of infection present. Case discussed with Dr. Wilson Singer who also evaluated patient. Unsure of exact diagnosis, but consistent with cyst. Advised warm compresses and ibuprofen. Return precautions discussed. He will f/u with his PCP.        Illene Labrador, PA-C 07/29/12 2249

## 2012-07-30 ENCOUNTER — Encounter: Payer: Self-pay | Admitting: Internal Medicine

## 2012-07-30 ENCOUNTER — Ambulatory Visit (INDEPENDENT_AMBULATORY_CARE_PROVIDER_SITE_OTHER): Payer: 59 | Admitting: Internal Medicine

## 2012-07-30 VITALS — BP 168/62 | HR 58 | Temp 97.8°F | Ht 68.0 in | Wt 290.0 lb

## 2012-07-30 DIAGNOSIS — L723 Sebaceous cyst: Secondary | ICD-10-CM

## 2012-07-30 DIAGNOSIS — L720 Epidermal cyst: Secondary | ICD-10-CM

## 2012-07-30 DIAGNOSIS — R51 Headache: Secondary | ICD-10-CM

## 2012-07-30 MED ORDER — IBUPROFEN 600 MG PO TABS: 600.0000 mg | ORAL_TABLET | Freq: Three times a day (TID) | ORAL | Status: DC | PRN

## 2012-07-30 MED ORDER — SULFAMETHOXAZOLE-TRIMETHOPRIM 800-160 MG PO TABS: 1.0000 | ORAL_TABLET | Freq: Two times a day (BID) | ORAL | Status: DC

## 2012-07-30 NOTE — Patient Instructions (Signed)
Epidermal Cyst An epidermal cyst is sometimes called a sebaceous cyst, epidermal inclusion cyst, or infundibular cyst. These cysts usually contain a substance that looks "pasty" or "cheesy" and may have a bad smell. This substance is a protein called keratin. Epidermal cysts are usually found on the face, neck, or trunk. They may also occur in the vaginal area or other parts of the genitalia of both men and women. Epidermal cysts are usually small, painless, slow-growing bumps or lumps that move freely under the skin. It is important not to try to pop them. This may cause an infection and lead to tenderness and swelling. CAUSES   Epidermal cysts may be caused by a deep penetrating injury to the skin or a plugged hair follicle, often associated with acne. SYMPTOMS   Epidermal cysts can become inflamed and cause:  Redness.   Tenderness.   Increased temperature of the skin over the bumps or lumps.   Grayish-white, bad smelling material that drains from the bump or lump.  DIAGNOSIS   Epidermal cysts are easily diagnosed by your caregiver during an exam. Rarely, a tissue sample (biopsy) may be taken to rule out other conditions that may resemble epidermal cysts. TREATMENT    Epidermal cysts often get better and disappear on their own. They are rarely ever cancerous.   If a cyst becomes infected, it may become inflamed and tender. This may require opening and draining the cyst. Treatment with antibiotics may be necessary. When the infection is gone, the cyst may be removed with minor surgery.   Small, inflamed cysts can often be treated with antibiotics or by injecting steroid medicines.   Sometimes, epidermal cysts become large and bothersome. If this happens, surgical removal in your caregiver's office may be necessary.  HOME CARE INSTRUCTIONS  Only take over-the-counter or prescription medicines as directed by your caregiver.   Take your antibiotics as directed. Finish them even if you  start to feel better.  SEEK MEDICAL CARE IF:    Your cyst becomes tender, red, or swollen.   Your condition is not improving or is getting worse.   You have any other questions or concerns.  MAKE SURE YOU:  Understand these instructions.   Will watch your condition.   Will get help right away if you are not doing well or get worse.  Document Released: 06/30/2004 Document Revised: 10/22/2011 Document Reviewed: 02/05/2011 [facility] Patient Information 2013 [place].

## 2012-07-30 NOTE — Progress Notes (Signed)
Subjective:    Patient ID: Troy Palmer, male    DOB: Jan 12, 1946, 66 y.o.   MRN: [id number]  HPI  Pt presents to the clinic today with c/o of a knot on his head. He was seen yesterday at the ER for the same. They told him it was a sebaceous cyst and that he should follow up with his PCP. The knot is large and tender to touch. It is also causing him headaches. He has not taking anything OTC to make this go away or for the headaches. He has never had anything like this before and is very concerned about it.  Review of Systems  Past Medical History  Diagnosis Date  . TB SKIN TEST, POSITIVE 03/28/2007  . SLEEP APNEA, OBSTRUCTIVE, MODERATE 04/09/2007  . RENAL INSUFFICIENCY 03/15/2010  . PVD WITH CLAUDICATION 02/10/2010  . Polymyalgia rheumatica 07/26/2008  . POLYARTHRALGIA 07/13/2008  . Other dysphagia 11/21/2009  . Osteoarth NOS-Unspec 03/28/2007  . LUNG NODULE 12/04/2007  . LOW BACK PAIN 04/09/2007  . LEG PAIN, BILATERAL 01/25/2010  . HYPERTENSION 03/28/2007  . HYPERLIPIDEMIA 12/04/2007  . HEMORRHOIDS, RECURRENT 02/11/2008  . GERD 12/04/2007  . ESOPHAGEAL STRICTURE 05/26/2008  . ERECTILE DYSFUNCTION 04/09/2007  . DYSPHAGIA UNSPECIFIED 03/16/2008  . DIVERTICULOSIS, COLON 12/04/2007  . DEPRESSION 12/04/2007  . Degeneration of cervical intervertebral disc 03/28/2007  . COLONIC POLYPS, HX OF 12/04/2007  . CONSTIPATION 09/25/2010  . CEREBROVASCULAR ACCIDENT, HX OF 07/17/2010  . BENIGN PROSTATIC HYPERTROPHY 04/09/2007  . BACK PAIN 09/16/2009  . ASTHMA 12/04/2007  . ALLERGIC RHINITIS 04/09/2007    Current Outpatient Prescriptions  Medication Sig Dispense Refill  . allopurinol (ZYLOPRIM) 100 MG tablet Take 100 mg by mouth daily.      Marland Kitchen atorvastatin (LIPITOR) 40 MG tablet Take 40 mg by mouth daily.      . bisacodyl (BISACODYL) 5 MG EC tablet Take 10 mg by mouth at bedtime as needed. For constipation      . furosemide (LASIX) 20 MG tablet Take 20 mg by mouth daily.      . mometasone (NASONEX) 50 MCG/ACT  nasal spray Place 2 sprays into the nose daily.      . nebivolol (BYSTOLIC) 10 MG tablet Take 1 tablet (10 mg total) by mouth daily.  30 tablet  11  . Tamsulosin HCl (FLOMAX) 0.4 MG CAPS         Allergies  Allergen Reactions  . Ace Inhibitors Swelling    Angioedema throat    Family History  Problem Relation Age of Onset  . Heart disease Father   . Lupus Brother   . Hypertension Brother   . Asthma Other     History   Social History  . Marital Status: Legally Separated    Spouse Name: N/A    Number of Children: 6  . Years of Education: N/A   Occupational History  . light Engineer, maintenance    Social History Main Topics  . Smoking status: Former Research scientist (life sciences)  . Smokeless tobacco: Not on file  . Alcohol Use: No  . Drug Use: No  . Sexually Active: Not on file   Other Topics Concern  . Not on file   Social History Narrative   Patient does not get regular exercise.6 children - 1 died with suicide, 1 with homicide     Constitutional: Denies fever, malaise, fatigue, headache or abrupt weight changes.  Skin: Pt reports knot on the back of his head. Denies redness, rashes, lesions or ulcercations.  Neurological: Denies  dizziness, difficulty with memory, difficulty with speech or problems with balance and coordination.   No other specific complaints in a complete review of systems (except as listed in HPI above).     Objective:   Physical Exam    BP 168/62  Pulse 58  Temp 97.8 F (36.6 C) (Oral)  Ht 5\' 8"  (1.727 m)  Wt 290 lb (131.543 kg)  BMI 44.09 kg/m2  SpO2 98% Wt Readings from Last 3 Encounters:  07/30/12 290 lb (131.543 kg)  07/29/12 286 lb (129.729 kg)  07/22/12 286 lb 1.9 oz (129.783 kg)    General: Appears his stated age, well developed, well nourished in NAD. Skin: Sebacious cyst noted on the crown of the head, red and inflammed, tender to touch. Cardiovascular: Normal rate and rhythm. S1,S2 noted.  No murmur, rubs or gallops noted. No JVD or BLE  edema. No carotid bruits noted. Pulmonary/Chest: Normal effort and positive vesicular breath sounds. No respiratory distress. No wheezes, rales or ronchi noted.       Assessment & Plan:   Cyst, new onset with additional workup required:  Will try Bactrim x 10 days Ibuprofen 600 mg for headaches  RTC as needed or if symptoms persist

## 2012-07-30 NOTE — ED Provider Notes (Signed)
Medical screening examination/treatment/procedure(s) were conducted as a shared visit with non-physician practitioner(s) and myself.  I personally evaluated the patient during the encounter.  [phone] with nonspecific lesion to top of head. Doesn't appear to be abscess. No hx of trauma. Doesn't appear fungal. No specific intervention planned at this time. PRN NSAIDs and observation. Return precautions discussed.  Virgel Manifold, MD 07/30/12 [phone]

## 2012-07-31 ENCOUNTER — Encounter: Payer: Self-pay | Admitting: Internal Medicine

## 2012-07-31 ENCOUNTER — Telehealth: Payer: Self-pay | Admitting: Internal Medicine

## 2012-07-31 ENCOUNTER — Ambulatory Visit (INDEPENDENT_AMBULATORY_CARE_PROVIDER_SITE_OTHER): Payer: 59 | Admitting: Internal Medicine

## 2012-07-31 VITALS — BP 120/78 | HR 59 | Temp 97.6°F | Ht 69.5 in | Wt 296.2 lb

## 2012-07-31 DIAGNOSIS — L0390 Cellulitis, unspecified: Secondary | ICD-10-CM

## 2012-07-31 DIAGNOSIS — L0291 Cutaneous abscess, unspecified: Secondary | ICD-10-CM | POA: Insufficient documentation

## 2012-07-31 DIAGNOSIS — T783XXA Angioneurotic edema, initial encounter: Secondary | ICD-10-CM | POA: Insufficient documentation

## 2012-07-31 DIAGNOSIS — R7302 Impaired glucose tolerance (oral): Secondary | ICD-10-CM

## 2012-07-31 DIAGNOSIS — I10 Essential (primary) hypertension: Secondary | ICD-10-CM

## 2012-07-31 DIAGNOSIS — R7309 Other abnormal glucose: Secondary | ICD-10-CM

## 2012-07-31 MED ORDER — DOXYCYCLINE HYCLATE 100 MG PO TABS: 100.0000 mg | ORAL_TABLET | Freq: Two times a day (BID) | ORAL | Status: DC

## 2012-07-31 MED ORDER — METHYLPREDNISOLONE ACETATE 80 MG/ML IJ SUSP
120.0000 mg | Freq: Once | INTRAMUSCULAR | Status: AC
  Administered 2012-07-31: 120 mg via INTRAMUSCULAR

## 2012-07-31 MED ORDER — HYDROXYZINE HCL 25 MG PO TABS: ORAL_TABLET | ORAL | Status: DC

## 2012-07-31 MED ORDER — METHYLPREDNISOLONE 4 MG PO KIT: PACK | ORAL | Status: DC

## 2012-07-31 NOTE — Telephone Encounter (Signed)
Patient informed. 

## 2012-07-31 NOTE — Telephone Encounter (Signed)
Patient Information:  Caller Name: [place]  Phone: [phone]  Patient: Troy Palmer, Troy Palmer  Gender: Male  DOB: Jun 22, 1946  Age: 66 Years  PCP: Cathlean Cower (Adults only)  Office Follow Up:  Does the office need to follow up with this patient?: Yes  Instructions For The Office: Call pt back at [phone] to let him know if he can be sooner.  Already has appt at 2:30 PM with Dr. Jenny Reichmann, triage dispostion says, "go to office now".  PLEASE CALL PT TO ADVISE.  THANKS.  RN Note:  Pt said he already has appt today at 2:30 PM with Dr. Jenny Reichmann.  Disposition says go to offce now.  OFFICE CALL PT BACK TO LET HIM KNOW IF HE CAN COME SOONER.  THANKS.    Symptoms  Reason For Call & Symptoms: was Bactrim for a lump in his head.  Took first dose yesterday now has hives on face and face is swollen.  Reviewed Health History In EMR: Yes  Reviewed Medications In EMR: Yes  Reviewed Allergies In EMR: Yes  Reviewed Surgeries / Procedures: Yes  Date of Onset of Symptoms: 07/30/2012  Guideline(s) Used:  Rash - Widespread on Drugs - Drug Reaction  Disposition Per Guideline:   Go to Office Now  Reason For Disposition Reached:   Face becomes swollen  Advice Given:  N/A

## 2012-07-31 NOTE — Patient Instructions (Addendum)
You had the steroid shot today Take all new medications as prescribed - the medrol steroid pack (generic)., and the generic hydroxyzine for itch and swelling, and the doxycycline antibiotic If the swelling goes away in the next 1-2 days or soon thereafter, you can stop taking the steroid pills before they run out You can also take benadryl OTC at 25-50 mg every 6 hours, as this can help with sleep as well Please do not take the Sulfa antibiotic any further, this will be listed as a new Allergy for you, but you should also remember to Never take Sulfa drug in the future Continue all other medications as before Please have the pharmacy call with any other refills you may need.

## 2012-07-31 NOTE — Progress Notes (Signed)
Subjective:    Patient ID: Troy Palmer, male    DOB: [birth year], 66 y.o.   MRN: [id number]  HPI  Here to f/u after being seen and tx for scalp abscess with septra;  Took one dose yesterday with rapid onset < 1 hr facial/eyelid rash/swelling/itching that has persisted since last night, quite pruritic; denies specific lip or tongue swelling, throat closing, wheezing or sob.  No prior hx of sulfa drug use he remembers, no other med use or contact allergens.   Pt denies fever, wt loss, night sweats, loss of appetite, or other constitutional symptoms , no trauma. Fortunately the abscess itself is improved in size and pain, no drainage. Pt denies chest pain, increased sob or doe, wheezing, orthopnea, PND, increased LE swelling, palpitations, dizziness or syncope. Pt denies new neurological symptoms such as new headache, or facial or extremity weakness or numbness  Pt denies polydipsia, polyuria Past Medical History  Diagnosis Date  . TB SKIN TEST, POSITIVE 03/28/2007  . SLEEP APNEA, OBSTRUCTIVE, MODERATE 04/09/2007  . RENAL INSUFFICIENCY 03/15/2010  . PVD WITH CLAUDICATION 02/10/2010  . Polymyalgia rheumatica 07/26/2008  . POLYARTHRALGIA 07/13/2008  . Other dysphagia 11/21/2009  . Osteoarth NOS-Unspec 03/28/2007  . LUNG NODULE 12/04/2007  . LOW BACK PAIN 04/09/2007  . LEG PAIN, BILATERAL 01/25/2010  . HYPERTENSION 03/28/2007  . HYPERLIPIDEMIA 12/04/2007  . HEMORRHOIDS, RECURRENT 02/11/2008  . GERD 12/04/2007  . ESOPHAGEAL STRICTURE 05/26/2008  . ERECTILE DYSFUNCTION 04/09/2007  . DYSPHAGIA UNSPECIFIED 03/16/2008  . DIVERTICULOSIS, COLON 12/04/2007  . DEPRESSION 12/04/2007  . Degeneration of cervical intervertebral disc 03/28/2007  . COLONIC POLYPS, HX OF 12/04/2007  . CONSTIPATION 09/25/2010  . CEREBROVASCULAR ACCIDENT, HX OF 07/17/2010  . BENIGN PROSTATIC HYPERTROPHY 04/09/2007  . BACK PAIN 09/16/2009  . ASTHMA 12/04/2007  . ALLERGIC RHINITIS 04/09/2007   Past Surgical History  Procedure Date  . Total knee  arthroplasty     x 2  . Rotator cuff repair     Left    reports that he has quit smoking. He does not have any smokeless tobacco history on file. He reports that he does not drink alcohol or use illicit drugs. family history includes Asthma in his other; Heart disease in his father; Hypertension in his brother; and Lupus in his brother. Allergies  Allergen Reactions  . Ace Inhibitors Swelling    Angioedema throat  . Sulfa Antibiotics Hives    And facial angioedema   Current Outpatient Prescriptions on File Prior to Visit  Medication Sig Dispense Refill  . allopurinol (ZYLOPRIM) 100 MG tablet Take 100 mg by mouth daily.      Marland Kitchen atorvastatin (LIPITOR) 40 MG tablet Take 40 mg by mouth daily.      . bisacodyl (BISACODYL) 5 MG EC tablet Take 10 mg by mouth at bedtime as needed. For constipation      . furosemide (LASIX) 20 MG tablet Take 20 mg by mouth daily.      Marland Kitchen ibuprofen (ADVIL,MOTRIN) 600 MG tablet Take 1 tablet (600 mg total) by mouth every 8 (eight) hours as needed for pain.  30 tablet  0  . mometasone (NASONEX) 50 MCG/ACT nasal spray Place 2 sprays into the nose daily.      . nebivolol (BYSTOLIC) 10 MG tablet Take 1 tablet (10 mg total) by mouth daily.  30 tablet  11  . sulfamethoxazole-trimethoprim (BACTRIM DS,SEPTRA DS) 800-160 MG per tablet Take 1 tablet by mouth 2 (two) times daily.  20 tablet  0  .  Tamsulosin HCl (FLOMAX) 0.4 MG CAPS        Review of Systems  Constitutional: Negative for diaphoresis and unexpected weight change.  HENT: Negative for tinnitus.   Eyes: Negative for photophobia and visual disturbance.  Respiratory: Negative for choking and stridor.   Gastrointestinal: Negative for vomiting and blood in stool.  Genitourinary: Negative for hematuria and decreased urine volume.  Musculoskeletal: Negative for gait problem.  Skin: Negative for color change and wound.  Neurological: Negative for tremors and numbness.  Psychiatric/Behavioral: Negative for decreased  concentration. The patient is not hyperactive.       Objective:   Physical Exam BP 120/78  Pulse 59  Temp 97.6 F (36.4 C) (Oral)  Ht 5' 9.5" (1.765 m)  Wt 296 lb 4 oz (134.378 kg)  BMI 43.12 kg/m2  SpO2 96% Physical Exam  VS noted Constitutional: Pt appears well-developed and well-nourished.  HENT: Head: Normocephalic.  Right Ear: External ear normal.  Left Ear: External ear normal.  Eyes: Conjunctivae and EOM are normal. Pupils are equal, round, and reactive to light.  Neck: Normal range of motion. Neck supple.  Cardiovascular: Normal rate and regular rhythm.   Pulmonary/Chest: Effort normal and breath sounds normal.  - no rales or wheezing Neurological: Pt is alert. Not confused  Skin: diffuse swelling 1-2+ facies and upper/lower eyelids with mild erythema nontender, no ulcers or abscess except for left crown area with 1 cm erythema/tender without fluctuance or drainage Psychiatric: Pt behavior is normal. Thought content normal.     Assessment & Plan:

## 2012-07-31 NOTE — Telephone Encounter (Signed)
To stop the bactrim  Ok to see at 230 pm if no worsening throat swelling, sob, wheezing

## 2012-08-02 ENCOUNTER — Encounter: Payer: Self-pay | Admitting: Internal Medicine

## 2012-08-02 NOTE — Assessment & Plan Note (Signed)
stable overall by hx and exam, most recent data reviewed with pt, and pt to continue medical treatment as before Lab Results  Component Value Date   HGBA1C 5.7 04/15/2012

## 2012-08-02 NOTE — Assessment & Plan Note (Signed)
Likely due to septra; to d/c septra, for benadryl 50 mg otc prn, medrol IM today, predpack asd, atarax prn itching,  to f/u any worsening symptoms or concerns

## 2012-08-02 NOTE — Assessment & Plan Note (Signed)
stable overall by hx and exam, most recent data reviewed with pt, and pt to continue medical treatment as before BP Readings from Last 3 Encounters:  07/31/12 120/78  07/30/12 168/62  07/29/12 179/78

## 2012-08-02 NOTE — Assessment & Plan Note (Signed)
Small, improved, to change med to doxycycline asd,  to f/u any worsening symptoms or concerns

## 2012-08-13 ENCOUNTER — Emergency Department (HOSPITAL_COMMUNITY)
Admission: EM | Admit: 2012-08-13 | Discharge: 2012-08-13 | Disposition: A | Discharge status: Home / Self Care | Payer: PRIVATE HEALTH INSURANCE | Attending: Emergency Medicine | Admitting: Emergency Medicine

## 2012-08-13 ENCOUNTER — Encounter (HOSPITAL_COMMUNITY): Payer: Self-pay

## 2012-08-13 ENCOUNTER — Emergency Department (HOSPITAL_COMMUNITY): Payer: PRIVATE HEALTH INSURANCE

## 2012-08-13 DIAGNOSIS — M353 Polymyalgia rheumatica: Secondary | ICD-10-CM | POA: Insufficient documentation

## 2012-08-13 DIAGNOSIS — Y92009 Unspecified place in unspecified non-institutional (private) residence as the place of occurrence of the external cause: Secondary | ICD-10-CM | POA: Insufficient documentation

## 2012-08-13 DIAGNOSIS — Z8673 Personal history of transient ischemic attack (TIA), and cerebral infarction without residual deficits: Secondary | ICD-10-CM | POA: Insufficient documentation

## 2012-08-13 DIAGNOSIS — Z79899 Other long term (current) drug therapy: Secondary | ICD-10-CM | POA: Insufficient documentation

## 2012-08-13 DIAGNOSIS — Z860100 Personal history of colon polyps, unspecified: Secondary | ICD-10-CM | POA: Insufficient documentation

## 2012-08-13 DIAGNOSIS — Z8719 Personal history of other diseases of the digestive system: Secondary | ICD-10-CM | POA: Insufficient documentation

## 2012-08-13 DIAGNOSIS — S93602A Unspecified sprain of left foot, initial encounter: Secondary | ICD-10-CM

## 2012-08-13 DIAGNOSIS — S93609A Unspecified sprain of unspecified foot, initial encounter: Secondary | ICD-10-CM | POA: Insufficient documentation

## 2012-08-13 DIAGNOSIS — W108XXA Fall (on) (from) other stairs and steps, initial encounter: Secondary | ICD-10-CM | POA: Insufficient documentation

## 2012-08-13 DIAGNOSIS — Y9389 Activity, other specified: Secondary | ICD-10-CM | POA: Insufficient documentation

## 2012-08-13 DIAGNOSIS — IMO0002 Reserved for concepts with insufficient information to code with codable children: Secondary | ICD-10-CM | POA: Insufficient documentation

## 2012-08-13 DIAGNOSIS — Z8679 Personal history of other diseases of the circulatory system: Secondary | ICD-10-CM | POA: Insufficient documentation

## 2012-08-13 DIAGNOSIS — I10 Essential (primary) hypertension: Secondary | ICD-10-CM | POA: Insufficient documentation

## 2012-08-13 DIAGNOSIS — Z87891 Personal history of nicotine dependence: Secondary | ICD-10-CM | POA: Insufficient documentation

## 2012-08-13 DIAGNOSIS — Z8659 Personal history of other mental and behavioral disorders: Secondary | ICD-10-CM | POA: Insufficient documentation

## 2012-08-13 DIAGNOSIS — E785 Hyperlipidemia, unspecified: Secondary | ICD-10-CM | POA: Insufficient documentation

## 2012-08-13 DIAGNOSIS — M5030 Other cervical disc degeneration, unspecified cervical region: Secondary | ICD-10-CM | POA: Insufficient documentation

## 2012-08-13 DIAGNOSIS — J45909 Unspecified asthma, uncomplicated: Secondary | ICD-10-CM | POA: Insufficient documentation

## 2012-08-13 DIAGNOSIS — Z8619 Personal history of other infectious and parasitic diseases: Secondary | ICD-10-CM | POA: Insufficient documentation

## 2012-08-13 MED ORDER — HYDROCODONE-ACETAMINOPHEN 5-325 MG PO TABS
1.0000 | ORAL_TABLET | Freq: Four times a day (QID) | ORAL | Status: DC | PRN
Start: 2012-08-13 — End: 2013-05-04

## 2012-08-13 NOTE — ED Provider Notes (Signed)
Medical screening examination/treatment/procedure(s) were performed by non-physician practitioner and as supervising physician I was immediately available for consultation/collaboration.  Carmin Muskrat, MD 08/13/12 2112

## 2012-08-13 NOTE — ED Notes (Signed)
Patient states that he fell down while going down 2 steps. Patient states he was carrying food and just missed the step. Patient reports that he felt and heard a pop on the top of his left foot.

## 2012-08-13 NOTE — ED Provider Notes (Signed)
History   This chart was scribed for Irena Cords, PA-C working with Carmin Muskrat, MD by Marin Comment, ED Scribe. This patient was seen in room WTR6/WTR6 and the patient's care was started at 1709.   CSN: [id number]  Arrival date & time 08/13/12  1551   First MD Initiated Contact with Patient 08/13/12 1709      Chief Complaint  Patient presents with  . Fall  . Foot Pain    The history is provided by the patient. No language interpreter was used.  Troy Palmer is a 67 y.o. male who presents to the Emergency Department complaining of constant, moderate left foot pain that started this morning. He states the pain is localized to the base of his left great toe. He denies any radiation of pain, numbness or tingling. He states that he fell earlier today coming out of his home when he missed the steps. He reports his symptoms are aggravated with weight bearing and ambulating. He has a h/o HTN and gout. He denies any h/o DM.   Past Medical History  Diagnosis Date  . TB SKIN TEST, POSITIVE 03/28/2007  . SLEEP APNEA, OBSTRUCTIVE, MODERATE 04/09/2007  . RENAL INSUFFICIENCY 03/15/2010  . PVD WITH CLAUDICATION 02/10/2010  . Polymyalgia rheumatica 07/26/2008  . POLYARTHRALGIA 07/13/2008  . Other dysphagia 11/21/2009  . Osteoarth NOS-Unspec 03/28/2007  . LUNG NODULE 12/04/2007  . LOW BACK PAIN 04/09/2007  . LEG PAIN, BILATERAL 01/25/2010  . HYPERTENSION 03/28/2007  . HYPERLIPIDEMIA 12/04/2007  . HEMORRHOIDS, RECURRENT 02/11/2008  . GERD 12/04/2007  . ESOPHAGEAL STRICTURE 05/26/2008  . ERECTILE DYSFUNCTION 04/09/2007  . DYSPHAGIA UNSPECIFIED 03/16/2008  . DIVERTICULOSIS, COLON 12/04/2007  . DEPRESSION 12/04/2007  . Degeneration of cervical intervertebral disc 03/28/2007  . COLONIC POLYPS, HX OF 12/04/2007  . CONSTIPATION 09/25/2010  . CEREBROVASCULAR ACCIDENT, HX OF 07/17/2010  . BENIGN PROSTATIC HYPERTROPHY 04/09/2007  . BACK PAIN 09/16/2009  . ASTHMA 12/04/2007  . ALLERGIC RHINITIS 04/09/2007     Past Surgical History  Procedure Date  . Total knee arthroplasty     x 2  . Rotator cuff repair     Left    Family History  Problem Relation Age of Onset  . Heart disease Father   . Lupus Brother   . Hypertension Brother   . Asthma Other     History  Substance Use Topics  . Smoking status: Former Research scientist (life sciences)  . Smokeless tobacco: Never Used  . Alcohol Use: No      Review of Systems All other systems negative except as documented in the HPI. All pertinent positives and negatives as reviewed in the HPI.   Allergies  Ace inhibitors and Sulfa antibiotics  Home Medications   Current Outpatient Rx  Name  Route  Sig  Dispense  Refill  . ALLOPURINOL 100 MG PO TABS   Oral   Take 100 mg by mouth daily.         . ATORVASTATIN CALCIUM 40 MG PO TABS   Oral   Take 40 mg by mouth daily.         Marland Kitchen BISACODYL 5 MG PO TBEC   Oral   Take 10 mg by mouth at bedtime as needed. For constipation         . DOXYCYCLINE HYCLATE 100 MG PO TABS   Oral   Take 1 tablet (100 mg total) by mouth 2 (two) times daily.   20 tablet   0   . FUROSEMIDE 20 MG PO  TABS   Oral   Take 20 mg by mouth daily.         Marland Kitchen HYDROXYZINE HCL 25 MG PO TABS      Take 1-2 tabs by mouth every 6 hrs   40 tablet   1   . IBUPROFEN 600 MG PO TABS   Oral   Take 1 tablet (600 mg total) by mouth every 8 (eight) hours as needed for pain.   30 tablet   0   . METHYLPREDNISOLONE 4 MG PO KIT      follow package directions   21 tablet   0   . MOMETASONE FUROATE 50 MCG/ACT NA SUSP   Nasal   Place 2 sprays into the nose daily.         . NEBIVOLOL HCL 10 MG PO TABS   Oral   Take 1 tablet (10 mg total) by mouth daily.   30 tablet   11   . TAMSULOSIN HCL 0.4 MG PO CAPS   Oral   Take 0.4 mg by mouth daily.            BP 175/71  Pulse 55  Temp 97.9 F (36.6 C) (Oral)  Resp 18  SpO2 98%  Physical Exam  Nursing note and vitals reviewed. Constitutional: He is oriented to person, place,  and time. He appears well-developed and well-nourished. No distress.  HENT:  Head: Normocephalic and atraumatic.  Eyes: EOM are normal.  Neck: Neck supple. No tracheal deviation present.  Cardiovascular: Normal rate.   Pulmonary/Chest: Effort normal. No respiratory distress.  Musculoskeletal: Normal range of motion. He exhibits tenderness.       Tenderness at the base of the left great toe. Normal ROM.   Neurological: He is alert and oriented to person, place, and time.  Skin: Skin is warm and dry.  Psychiatric: He has a normal mood and affect. His behavior is normal.    ED Course  Procedures (including critical care time)  DIAGNOSTIC STUDIES: Oxygen Saturation is 98% on room air, normal by my interpretation.    COORDINATION OF CARE:  17:20-Discussed planned course of treatment with the patient including reviewing imaging, who is agreeable at this time.    Labs Reviewed - No data to display Dg Foot Complete Left  08/13/2012  *RADIOLOGY REPORT*  Clinical Data: Foot pain and fall  LEFT FOOT - COMPLETE 3+ VIEW  Comparison: None  Findings: Hallux valgus deformity present.  Mild degenerative changes involve the first MTP joint.  Bilateral posterior and plantar are heel spurs noted.  IMPRESSION:  1.  No acute findings. 2.  Hallux valgus deformity.   Original Report Authenticated By: Kerby Moors, M.D.     Follow up with the orthopedist provided. Return here as needed. Ice and elevate the foot.    MDM  I personally performed the services described in this documentation, which was scribed in my presence. The recorded information has been reviewed and is accurate.       Brent General, PA-C 08/13/12 1738

## 2012-10-14 ENCOUNTER — Other Ambulatory Visit (INDEPENDENT_AMBULATORY_CARE_PROVIDER_SITE_OTHER): Payer: 59

## 2012-10-14 ENCOUNTER — Encounter: Payer: Self-pay | Admitting: Internal Medicine

## 2012-10-14 ENCOUNTER — Other Ambulatory Visit: Payer: Self-pay | Admitting: Internal Medicine

## 2012-10-14 ENCOUNTER — Ambulatory Visit (INDEPENDENT_AMBULATORY_CARE_PROVIDER_SITE_OTHER): Payer: 59 | Admitting: Internal Medicine

## 2012-10-14 VITALS — BP 140/78 | HR 55 | Temp 97.9°F | Ht 70.0 in | Wt 292.5 lb

## 2012-10-14 DIAGNOSIS — R7309 Other abnormal glucose: Secondary | ICD-10-CM

## 2012-10-14 DIAGNOSIS — R7302 Impaired glucose tolerance (oral): Secondary | ICD-10-CM

## 2012-10-14 DIAGNOSIS — Z0000 Encounter for general adult medical examination without abnormal findings: Secondary | ICD-10-CM

## 2012-10-14 DIAGNOSIS — N400 Enlarged prostate without lower urinary tract symptoms: Secondary | ICD-10-CM

## 2012-10-14 DIAGNOSIS — E782 Mixed hyperlipidemia: Secondary | ICD-10-CM

## 2012-10-14 DIAGNOSIS — Z23 Encounter for immunization: Secondary | ICD-10-CM

## 2012-10-14 DIAGNOSIS — J069 Acute upper respiratory infection, unspecified: Secondary | ICD-10-CM | POA: Insufficient documentation

## 2012-10-14 LAB — BASIC METABOLIC PANEL
BUN: 23 mg/dL (ref 6–23)
CO2: 31 mEq/L (ref 19–32)
Calcium: 9 mg/dL (ref 8.4–10.5)
Chloride: 105 mEq/L (ref 96–112)
Creatinine, Ser: 1.8 mg/dL — ABNORMAL HIGH (ref 0.4–1.5)
GFR: 50.03 mL/min — ABNORMAL LOW (ref >60.00)
Glucose, Bld: 85 mg/dL (ref 70–99)
Potassium: 4.4 mEq/L (ref 3.5–5.1)
Sodium: 140 mEq/L (ref 135–145)

## 2012-10-14 LAB — CBC WITH DIFFERENTIAL/PLATELET
Basophils Relative: 1.7 % (ref 0.0–3.0)
Eosinophils Absolute: 0.1 10*3/uL (ref 0.0–0.7)
Eosinophils Relative: 2.1 % (ref 0.0–5.0)
HCT: 43 % (ref 39.0–52.0)
Hemoglobin: 13.7 g/dL (ref 13.0–17.0)
MCHC: 31.9 g/dL (ref 30.0–36.0)
MCV: 84.9 fl (ref 78.0–100.0)
Monocytes Absolute: 0.8 10*3/uL (ref 0.1–1.0)
Neutro Abs: 2.8 10*3/uL (ref 1.4–7.7)
RBC: 5.06 Mil/uL (ref 4.22–5.81)
WBC: 5.2 10*3/uL (ref 4.5–10.5)

## 2012-10-14 LAB — URINALYSIS, ROUTINE W REFLEX MICROSCOPIC
Bilirubin Urine: NEGATIVE
Ketones, ur: NEGATIVE
Leukocytes, UA: NEGATIVE
Nitrite: NEGATIVE
Specific Gravity, Urine: 1.02 (ref 1.000–1.030)
Total Protein, Urine: 30
Urine Glucose: NEGATIVE
Urobilinogen, UA: 0.2 (ref 0.0–1.0)
pH: 6 (ref 5.0–8.0)

## 2012-10-14 LAB — LIPID PANEL
Cholesterol: 181 mg/dL (ref 0–200)
HDL: 56 mg/dL (ref >39.00)
LDL Cholesterol: 111 mg/dL — ABNORMAL HIGH (ref 0–99)
Total CHOL/HDL Ratio: 3
Triglycerides: 72 mg/dL (ref 0.0–149.0)
VLDL: 14.4 mg/dL (ref 0.0–40.0)

## 2012-10-14 LAB — HEPATIC FUNCTION PANEL
ALT: 23 U/L (ref 0–53)
AST: 24 U/L (ref 0–37)
Albumin: 3.4 g/dL — ABNORMAL LOW (ref 3.5–5.2)
Alkaline Phosphatase: 100 U/L (ref 39–117)
Bilirubin, Direct: 0.1 mg/dL (ref 0.0–0.3)
Total Bilirubin: 0.5 mg/dL (ref 0.3–1.2)
Total Protein: 7.2 g/dL (ref 6.0–8.3)

## 2012-10-14 LAB — TSH: TSH: 1.04 u[IU]/mL (ref 0.35–5.50)

## 2012-10-14 LAB — PSA: PSA: 1.21 ng/mL (ref 0.10–4.00)

## 2012-10-14 MED ORDER — ALLOPURINOL 100 MG PO TABS: 100.0000 mg | ORAL_TABLET | Freq: Every day | ORAL | Status: DC

## 2012-10-14 MED ORDER — FUROSEMIDE 20 MG PO TABS: 20.0000 mg | ORAL_TABLET | Freq: Every day | ORAL | Status: DC

## 2012-10-14 MED ORDER — ATORVASTATIN CALCIUM 40 MG PO TABS: 40.0000 mg | ORAL_TABLET | Freq: Every day | ORAL | Status: DC

## 2012-10-14 MED ORDER — AZITHROMYCIN 250 MG PO TABS: ORAL_TABLET | ORAL | Status: DC

## 2012-10-14 NOTE — Assessment & Plan Note (Signed)
Mild to mod, for antibx course,  to f/u any worsening symptoms or concerns 

## 2012-10-14 NOTE — Assessment & Plan Note (Signed)
stable overall by history and exam, recent data reviewed with pt, and pt to continue medical treatment as before,  to f/u any worsening symptoms or concerns Lab Results  Component Value Date   HGBA1C 5.8 10/14/2012

## 2012-10-14 NOTE — Patient Instructions (Addendum)
You had the tetanus (Td) shot today Please take all new medication as prescribed - the antibiotic Please continue all other medications as before, and refills have been done if requested. Please continue your efforts at being more active, low cholesterol diet, and weight control. You are otherwise up to date with prevention measures today. You can also take Mucinex (or it's generic off brand) for congestion and ear fullness, and tylenol as needed for pain. Please go to the LAB in the Basement (turn left off the elevator) for the tests to be done today You will be contacted by phone if any changes need to be made immediately.  Otherwise, you will receive a letter about your results with an explanation, but please check with MyChart first. Thank you for enrolling in [facility]. Please follow the instructions below to securely access your online medical record. MyChart allows you to send messages to your doctor, view your test results, renew your prescriptions, schedule appointments, and more. To Log into My Chart online, please go by [employer] or [employer] to [email or web address], or download the MyChart App from the [employer].  Your Username is: [email or web address] (pass poon) Please return in 6 months, or sooner if needed

## 2012-10-14 NOTE — Progress Notes (Signed)
Subjective:    Patient ID: Troy Palmer, male    DOB: 10/16/1945, 67 y.o.   MRN: [id number]  HPI  Here for wellness and f/u;  Overall doing ok;  Pt denies CP, worsening SOB, DOE, wheezing, orthopnea, PND, worsening LE edema, palpitations, dizziness or syncope.  Pt denies neurological change such as new headache, facial or extremity weakness.  Pt denies polydipsia, polyuria, or low sugar symptoms. Pt states overall good compliance with treatment and medications, good tolerability, and has been trying to follow lower cholesterol diet.  Pt denies worsening depressive symptoms, suicidal ideation or panic. No fever, night sweats, wt loss, loss of appetite, or other constitutional symptoms.  Pt states good ability with ADL's, has low fall risk, home safety reviewed and adequate, no other significant changes in hearing or vision, and only occasionally active with exercise.  Also incidentally with 2-3 days acute onset fever, facial pain, pressure, headache, general weakness and malaise, and greenish d/c.   Past Medical History  Diagnosis Date  . TB SKIN TEST, POSITIVE 03/28/2007  . SLEEP APNEA, OBSTRUCTIVE, MODERATE 04/09/2007  . RENAL INSUFFICIENCY 03/15/2010  . PVD WITH CLAUDICATION 02/10/2010  . Polymyalgia rheumatica 07/26/2008  . POLYARTHRALGIA 07/13/2008  . Other dysphagia 11/21/2009  . Osteoarth NOS-Unspec 03/28/2007  . LUNG NODULE 12/04/2007  . LOW BACK PAIN 04/09/2007  . LEG PAIN, BILATERAL 01/25/2010  . HYPERTENSION 03/28/2007  . HYPERLIPIDEMIA 12/04/2007  . HEMORRHOIDS, RECURRENT 02/11/2008  . GERD 12/04/2007  . ESOPHAGEAL STRICTURE 05/26/2008  . ERECTILE DYSFUNCTION 04/09/2007  . DYSPHAGIA UNSPECIFIED 03/16/2008  . DIVERTICULOSIS, COLON 12/04/2007  . DEPRESSION 12/04/2007  . Degeneration of cervical intervertebral disc 03/28/2007  . COLONIC POLYPS, HX OF 12/04/2007  . CONSTIPATION 09/25/2010  . CEREBROVASCULAR ACCIDENT, HX OF 07/17/2010  . BENIGN PROSTATIC HYPERTROPHY 04/09/2007  . BACK PAIN 09/16/2009   . ASTHMA 12/04/2007  . ALLERGIC RHINITIS 04/09/2007   Past Surgical History  Procedure Laterality Date  . Total knee arthroplasty      x 2  . Rotator cuff repair      Left    reports that he has quit smoking. He has never used smokeless tobacco. He reports that he does not drink alcohol or use illicit drugs. family history includes Asthma in his other; Heart disease in his father; Hypertension in his brother; and Lupus in his brother. Allergies  Allergen Reactions  . Ace Inhibitors Swelling    Angioedema throat  . Sulfa Antibiotics Hives    And facial angioedema   Current Outpatient Prescriptions on File Prior to Visit  Medication Sig Dispense Refill  . bisacodyl (BISACODYL) 5 MG EC tablet Take 10 mg by mouth at bedtime as needed. For constipation      . HYDROcodone-acetaminophen (NORCO/VICODIN) 5-325 MG per tablet Take 1 tablet by mouth every 6 (six) hours as needed for pain.  15 tablet  0  . hydrOXYzine (ATARAX/VISTARIL) 25 MG tablet Take 1-2 tabs by mouth every 6 hrs  40 tablet  1  . ibuprofen (ADVIL,MOTRIN) 600 MG tablet Take 1 tablet (600 mg total) by mouth every 8 (eight) hours as needed for pain.  30 tablet  0  . mometasone (NASONEX) 50 MCG/ACT nasal spray Place 2 sprays into the nose daily.      . Tamsulosin HCl (FLOMAX) 0.4 MG CAPS Take 0.4 mg by mouth daily.        No current facility-administered medications on file prior to visit.   Review of Systems Constitutional: Negative for diaphoresis, activity change, appetite  change or unexpected weight change.  HENT: Negative for hearing loss, ear pain, facial swelling, mouth sores and neck stiffness.   Eyes: Negative for pain, redness and visual disturbance.  Respiratory: Negative for shortness of breath and wheezing.   Cardiovascular: Negative for chest pain and palpitations.  Gastrointestinal: Negative for diarrhea, blood in stool, abdominal distention or other pain Genitourinary: Negative for hematuria, flank pain or  change in urine volume.  Musculoskeletal: Negative for myalgias and joint swelling.  Skin: Negative for color change and wound.  Neurological: Negative for syncope and numbness. other than noted Hematological: Negative for adenopathy.  Psychiatric/Behavioral: Negative for hallucinations, self-injury, decreased concentration and agitation.      Objective:   Physical Exam BP 140/78  Pulse 55  Temp(Src) 97.9 F (36.6 C) (Oral)  Ht 5\' 10"  (1.778 m)  Wt 292 lb 8 oz (132.677 kg)  BMI 41.97 kg/m2  SpO2 96% VS noted, mild ill Constitutional: Pt is oriented to person, place, and time. Appears well-developed and well-nourished.  Head: Normocephalic and atraumatic.  Right Ear: External ear normal.  Left Ear: External ear normal.  Nose: Nose normal.  Bilat tm's with mild erythema.  Max sinus areas mild tender.  Pharynx with mild erythema, no exudate Eyes: Conjunctivae and EOM are normal. Pupils are equal, round, and reactive to light.  Neck: Normal range of motion. Neck supple. No JVD present. No tracheal deviation present.  Cardiovascular: Normal rate, regular rhythm, normal heart sounds and intact distal pulses.   Pulmonary/Chest: Effort normal and breath sounds normal.  Abdominal: Soft. Bowel sounds are normal. There is no tenderness. No HSM  Musculoskeletal: Normal range of motion. Exhibits no edema.  Lymphadenopathy:  Has no cervical adenopathy.  Neurological: Pt is alert and oriented to person, place, and time. Pt has normal reflexes. No cranial nerve deficit.  Skin: Skin is warm and dry. No rash noted.  Psychiatric:  Has  normal mood and affect. Behavior is normal.     Assessment & Plan:

## 2012-10-14 NOTE — Assessment & Plan Note (Signed)

## 2013-01-13 ENCOUNTER — Ambulatory Visit: Payer: 59 | Admitting: Internal Medicine

## 2013-01-15 ENCOUNTER — Encounter: Payer: Self-pay | Admitting: Internal Medicine

## 2013-01-15 ENCOUNTER — Ambulatory Visit (INDEPENDENT_AMBULATORY_CARE_PROVIDER_SITE_OTHER): Payer: 59 | Admitting: Internal Medicine

## 2013-01-15 VITALS — BP 130/70 | HR 63 | Temp 97.4°F | Ht 70.0 in | Wt 296.1 lb

## 2013-01-15 DIAGNOSIS — R7309 Other abnormal glucose: Secondary | ICD-10-CM

## 2013-01-15 DIAGNOSIS — L03115 Cellulitis of right lower limb: Secondary | ICD-10-CM | POA: Insufficient documentation

## 2013-01-15 DIAGNOSIS — I10 Essential (primary) hypertension: Secondary | ICD-10-CM

## 2013-01-15 DIAGNOSIS — L02619 Cutaneous abscess of unspecified foot: Secondary | ICD-10-CM

## 2013-01-15 DIAGNOSIS — L03116 Cellulitis of left lower limb: Secondary | ICD-10-CM | POA: Insufficient documentation

## 2013-01-15 DIAGNOSIS — E785 Hyperlipidemia, unspecified: Secondary | ICD-10-CM

## 2013-01-15 DIAGNOSIS — R7302 Impaired glucose tolerance (oral): Secondary | ICD-10-CM

## 2013-01-15 MED ORDER — DOXYCYCLINE HYCLATE 100 MG PO TABS: 100.0000 mg | ORAL_TABLET | Freq: Two times a day (BID) | ORAL | Status: DC

## 2013-01-15 NOTE — Assessment & Plan Note (Signed)
stable overall by history and exam, recent data reviewed with pt, and pt to continue medical treatment as before,  to f/u any worsening symptoms or concerns BP Readings from Last 3 Encounters:  01/15/13 130/70  10/14/12 140/78  08/13/12 175/71

## 2013-01-15 NOTE — Assessment & Plan Note (Signed)
stable overall by history and exam, recent data reviewed with pt, and pt to continue medical treatment as before,  to f/u any worsening symptoms or concerns Lab Results  Component Value Date   HGBA1C 5.8 10/14/2012

## 2013-01-15 NOTE — Assessment & Plan Note (Signed)
Worse than left foot, s/p recent ingrown nail removal healing, Mild to mod, for antibx course,  to f/u any worsening symptoms or concerns

## 2013-01-15 NOTE — Patient Instructions (Addendum)
Please take all new medication as prescribed - the antibiotic Please continue all other medications as before Your blood work from March was given to you today

## 2013-01-15 NOTE — Progress Notes (Signed)
Subjective:    Patient ID: Troy Palmer, male    DOB: [birth year], 67 y.o.   MRN: [id number]  HPI   Here to f/u, after recent bilat lateral great toe ingrown nail removals, with 2-3 days red/tender/swelling to the great toes extending the midfoot only on the left, to the ankle on the right, dorsal feet only, though he describes a burning sensation on the plantar distal feet, tender/pain to walk.  No other red streaks, fever, chills, drainage. Wonders about gout but has good med compliance with current meds. No trauma. Past Medical History  Diagnosis Date  . TB SKIN TEST, POSITIVE 03/28/2007  . SLEEP APNEA, OBSTRUCTIVE, MODERATE 04/09/2007  . RENAL INSUFFICIENCY 03/15/2010  . PVD WITH CLAUDICATION 02/10/2010  . Polymyalgia rheumatica 07/26/2008  . POLYARTHRALGIA 07/13/2008  . Other dysphagia 11/21/2009  . Osteoarth NOS-Unspec 03/28/2007  . LUNG NODULE 12/04/2007  . LOW BACK PAIN 04/09/2007  . LEG PAIN, BILATERAL 01/25/2010  . HYPERTENSION 03/28/2007  . HYPERLIPIDEMIA 12/04/2007  . HEMORRHOIDS, RECURRENT 02/11/2008  . GERD 12/04/2007  . ESOPHAGEAL STRICTURE 05/26/2008  . ERECTILE DYSFUNCTION 04/09/2007  . DYSPHAGIA UNSPECIFIED 03/16/2008  . DIVERTICULOSIS, COLON 12/04/2007  . DEPRESSION 12/04/2007  . Degeneration of cervical intervertebral disc 03/28/2007  . COLONIC POLYPS, HX OF 12/04/2007  . CONSTIPATION 09/25/2010  . CEREBROVASCULAR ACCIDENT, HX OF 07/17/2010  . BENIGN PROSTATIC HYPERTROPHY 04/09/2007  . BACK PAIN 09/16/2009  . ASTHMA 12/04/2007  . ALLERGIC RHINITIS 04/09/2007   Past Surgical History  Procedure Laterality Date  . Total knee arthroplasty      x 2  . Rotator cuff repair      Left    reports that he has quit smoking. He has never used smokeless tobacco. He reports that he does not drink alcohol or use illicit drugs. family history includes Asthma in his other; Heart disease in his father; Hypertension in his brother; and Lupus in his brother. Allergies  Allergen Reactions  . Ace  Inhibitors Swelling    Angioedema throat  . Sulfa Antibiotics Hives    And facial angioedema   Current Outpatient Prescriptions on File Prior to Visit  Medication Sig Dispense Refill  . allopurinol (ZYLOPRIM) 100 MG tablet Take 1 tablet (100 mg total) by mouth daily.  90 tablet  3  . atorvastatin (LIPITOR) 40 MG tablet Take 1 tablet (40 mg total) by mouth daily.  90 tablet  3  . azithromycin (ZITHROMAX Z-PAK) 250 MG tablet Use as directed  6 each  1  . bisacodyl (BISACODYL) 5 MG EC tablet Take 10 mg by mouth at bedtime as needed. For constipation      . BYSTOLIC 10 MG tablet take 1 tablet by mouth once daily  30 tablet  11  . furosemide (LASIX) 20 MG tablet Take 1 tablet (20 mg total) by mouth daily.  90 tablet  3  . HYDROcodone-acetaminophen (NORCO/VICODIN) 5-325 MG per tablet Take 1 tablet by mouth every 6 (six) hours as needed for pain.  15 tablet  0  . hydrOXYzine (ATARAX/VISTARIL) 25 MG tablet Take 1-2 tabs by mouth every 6 hrs  40 tablet  1  . ibuprofen (ADVIL,MOTRIN) 600 MG tablet Take 1 tablet (600 mg total) by mouth every 8 (eight) hours as needed for pain.  30 tablet  0  . mometasone (NASONEX) 50 MCG/ACT nasal spray Place 2 sprays into the nose daily.      . Tamsulosin HCl (FLOMAX) 0.4 MG CAPS Take 0.4 mg by mouth daily.  No current facility-administered medications on file prior to visit.   Review of Systems  Constitutional: Negative for unexpected weight change, or unusual diaphoresis  HENT: Negative for tinnitus.   Eyes: Negative for photophobia and visual disturbance.  Respiratory: Negative for choking and stridor.   Gastrointestinal: Negative for vomiting and blood in stool.  Genitourinary: Negative for hematuria and decreased urine volume.  Musculoskeletal: Negative for acute joint swelling Skin: Negative for color change and wound.  Neurological: Negative for tremors and numbness other than noted  Psychiatric/Behavioral: Negative for decreased concentration or   hyperactivity.       Objective:   Physical Exam BP 130/70  Pulse 63  Temp(Src) 97.4 F (36.3 C) (Oral)  Ht 5\' 10"  (1.778 m)  Wt 296 lb 2 oz (134.321 kg)  BMI 42.49 kg/m2  SpO2 97% VS noted, not ill appearing Constitutional: Pt appears well-developed and well-nourished.  HENT: Head: NCAT.  Right Ear: External ear normal.  Left Ear: External ear normal.  Eyes: Conjunctivae and EOM are normal. Pupils are equal, round, and reactive to light.  Neck: Normal range of motion. Neck supple.  Cardiovascular: Normal rate and regular rhythm.   Pulmonary/Chest: Effort normal and breath sounds normal.  Neurological: Pt is alert. Not confused . Motor/sens intact to LE's Skin: left foot with 2+ red/tender/swelling to left great toe with warmth/tender/swelling extending to medial dorsal midfoot only Right foot with 2-3+ red/tender/swelling worst at the great toe with extension to ankle involving all dorsal mid foot No other foot ulcer, red, swelling; feet o/w neurovasc intact Dorsalis pedis 1+ bilat Psychiatric: Pt behavior is normal. Thought content normal.     Assessment & Plan:

## 2013-01-15 NOTE — Assessment & Plan Note (Signed)
Mild to mod, for antibx course,  to f/u any worsening symptoms or concerns 

## 2013-02-10 ENCOUNTER — Ambulatory Visit (INDEPENDENT_AMBULATORY_CARE_PROVIDER_SITE_OTHER): Payer: PRIVATE HEALTH INSURANCE | Admitting: Internal Medicine

## 2013-02-10 ENCOUNTER — Encounter: Payer: Self-pay | Admitting: Internal Medicine

## 2013-02-10 VITALS — BP 138/70 | HR 61 | Temp 97.5°F | Ht 70.0 in | Wt 298.5 lb

## 2013-02-10 DIAGNOSIS — R7309 Other abnormal glucose: Secondary | ICD-10-CM

## 2013-02-10 DIAGNOSIS — J45909 Unspecified asthma, uncomplicated: Secondary | ICD-10-CM

## 2013-02-10 DIAGNOSIS — R609 Edema, unspecified: Secondary | ICD-10-CM

## 2013-02-10 DIAGNOSIS — I10 Essential (primary) hypertension: Secondary | ICD-10-CM

## 2013-02-10 DIAGNOSIS — J309 Allergic rhinitis, unspecified: Secondary | ICD-10-CM

## 2013-02-10 DIAGNOSIS — R7302 Impaired glucose tolerance (oral): Secondary | ICD-10-CM

## 2013-02-10 MED ORDER — METHYLPREDNISOLONE ACETATE 80 MG/ML IJ SUSP
80.0000 mg | Freq: Once | INTRAMUSCULAR | Status: AC
  Administered 2013-02-10: 80 mg via INTRAMUSCULAR

## 2013-02-10 MED ORDER — FLUTICASONE PROPIONATE 50 MCG/ACT NA SUSP: 2.0000 | Freq: Every day | NASAL | Status: DC

## 2013-02-10 MED ORDER — FUROSEMIDE 40 MG PO TABS: 40.0000 mg | ORAL_TABLET | Freq: Every day | ORAL | Status: DC

## 2013-02-10 MED ORDER — METHYLPREDNISOLONE 4 MG PO KIT: PACK | ORAL | Status: DC

## 2013-02-10 NOTE — Patient Instructions (Signed)
You had the steroid shot today Please take all new medication as prescribed - the flonase for the nasal allergies, medrol steroid medication, and the increased lasix (furosemide) to 40 mg per day Please continue all other medications as before, and refills have been done if requested. Please have the pharmacy call with any other refills you may need.  Please remember to sign up for My Chart if you have not done so, as this will be important to you in the future with finding out test results, communicating by private email, and scheduling acute appointments online when needed.

## 2013-02-10 NOTE — Progress Notes (Signed)
Subjective:    Patient ID: Vonzella Nipple, male    DOB: 05-04-1946, 67 y.o.   MRN: [id number]  HPI here with periph edema, worse in the past 2 mo, since the ingrown toenail surgury. Despite fluid pills, Overall good compliance with treatment, and good medicine tolerability - lasix 20 mg. Does have several wks ongoing nasal allergy symptoms with clearish congestion, itch and sneezing, without fever, pain, ST, cough, swelling or wheezing.  Pt denies new neurological symptoms such as new headache, or facial or extremity weakness or numbness   Pt denies polydipsia, polyuria, or low sugar symptoms such as weakness or confusion improved with po intake.   Pt denies fever, wt loss, night sweats, loss of appetite, or other constitutional symptoms Past Medical History  Diagnosis Date  . TB SKIN TEST, POSITIVE 03/28/2007  . SLEEP APNEA, OBSTRUCTIVE, MODERATE 04/09/2007  . RENAL INSUFFICIENCY 03/15/2010  . PVD WITH CLAUDICATION 02/10/2010  . Polymyalgia rheumatica 07/26/2008  . POLYARTHRALGIA 07/13/2008  . Other dysphagia 11/21/2009  . Osteoarth NOS-Unspec 03/28/2007  . LUNG NODULE 12/04/2007  . LOW BACK PAIN 04/09/2007  . LEG PAIN, BILATERAL 01/25/2010  . HYPERTENSION 03/28/2007  . HYPERLIPIDEMIA 12/04/2007  . HEMORRHOIDS, RECURRENT 02/11/2008  . GERD 12/04/2007  . ESOPHAGEAL STRICTURE 05/26/2008  . ERECTILE DYSFUNCTION 04/09/2007  . DYSPHAGIA UNSPECIFIED 03/16/2008  . DIVERTICULOSIS, COLON 12/04/2007  . DEPRESSION 12/04/2007  . Degeneration of cervical intervertebral disc 03/28/2007  . COLONIC POLYPS, HX OF 12/04/2007  . CONSTIPATION 09/25/2010  . CEREBROVASCULAR ACCIDENT, HX OF 07/17/2010  . BENIGN PROSTATIC HYPERTROPHY 04/09/2007  . BACK PAIN 09/16/2009  . ASTHMA 12/04/2007  . ALLERGIC RHINITIS 04/09/2007   Past Surgical History  Procedure Laterality Date  . Total knee arthroplasty      x 2  . Rotator cuff repair      Left    reports that he has quit smoking. He has never used smokeless tobacco. He reports  that he does not drink alcohol or use illicit drugs. family history includes Asthma in his other; Heart disease in his father; Hypertension in his brother; and Lupus in his brother. Allergies  Allergen Reactions  . Ace Inhibitors Swelling    Angioedema throat  . Sulfa Antibiotics Hives    And facial angioedema   Current Outpatient Prescriptions on File Prior to Visit  Medication Sig Dispense Refill  . allopurinol (ZYLOPRIM) 100 MG tablet Take 1 tablet (100 mg total) by mouth daily.  90 tablet  3  . atorvastatin (LIPITOR) 40 MG tablet Take 1 tablet (40 mg total) by mouth daily.  90 tablet  3  . bisacodyl (BISACODYL) 5 MG EC tablet Take 10 mg by mouth at bedtime as needed. For constipation      . BYSTOLIC 10 MG tablet take 1 tablet by mouth once daily  30 tablet  11  . HYDROcodone-acetaminophen (NORCO/VICODIN) 5-325 MG per tablet Take 1 tablet by mouth every 6 (six) hours as needed for pain.  15 tablet  0  . hydrOXYzine (ATARAX/VISTARIL) 25 MG tablet Take 1-2 tabs by mouth every 6 hrs  40 tablet  1  . ibuprofen (ADVIL,MOTRIN) 600 MG tablet Take 1 tablet (600 mg total) by mouth every 8 (eight) hours as needed for pain.  30 tablet  0  . Tamsulosin HCl (FLOMAX) 0.4 MG CAPS Take 0.4 mg by mouth daily.        No current facility-administered medications on file prior to visit.   Review of Systems  Constitutional: Negative for unexpected  weight change, or unusual diaphoresis  HENT: Negative for tinnitus.   Eyes: Negative for photophobia and visual disturbance.  Respiratory: Negative for choking and stridor.   Gastrointestinal: Negative for vomiting and blood in stool.  Genitourinary: Negative for hematuria and decreased urine volume.  Musculoskeletal: Negative for acute joint swelling Skin: Negative for color change and wound.  Neurological: Negative for tremors and numbness other than noted  Psychiatric/Behavioral: Negative for decreased concentration or  hyperactivity.       Objective:    Physical Exam BP 138/70  Pulse 61  Temp(Src) 97.5 F (36.4 C) (Oral)  Ht 5\' 10"  (1.778 m)  Wt 298 lb 8 oz (135.399 kg)  BMI 42.83 kg/m2  SpO2 96% VS noted,  Constitutional: Pt appears well-developed and well-nourished.  HENT: Head: NCAT.  Right Ear: External ear normal.  Left Ear: External ear normal.  Bilat tm's with mild erythema.  Max sinus areas non tender.  Pharynx with mild erythema, no exudate Eyes: Conjunctivae and EOM are normal. Pupils are equal, round, and reactive to light.  Neck: Normal range of motion. Neck supple.  Cardiovascular: Normal rate and regular rhythm.   Pulmonary/Chest: Effort normal and breath sounds no rales or wheezing.  Abd:  Soft, NT, non-distended, + BS Neurological: Pt is alert. Not confused  Skin: Skin is warm. No erythema. bilat ankle edema 1+ Psychiatric: Pt behavior is normal. Thought content normal.     Assessment & Plan:

## 2013-02-13 NOTE — Assessment & Plan Note (Signed)
[place] for lasix 40 qd,  to f/u any worsening symptoms or concerns

## 2013-02-13 NOTE — Assessment & Plan Note (Signed)
stable overall by history and exam, recent data reviewed with pt, and pt to continue medical treatment as before,  to f/u any worsening symptoms or concerns Lab Results  Component Value Date   HGBA1C 5.8 10/14/2012

## 2013-02-13 NOTE — Assessment & Plan Note (Signed)
stable overall by history and exam, recent data reviewed with pt, and pt to continue medical treatment as before,  to f/u any worsening symptoms or concerns BP Readings from Last 3 Encounters:  02/10/13 138/70  01/15/13 130/70  10/14/12 140/78

## 2013-02-13 NOTE — Assessment & Plan Note (Signed)
stable overall by history and exam, recent data reviewed with pt, and pt to continue medical treatment as before,  to f/u any worsening symptoms or concerns SpO2 Readings from Last 3 Encounters:  02/10/13 96%  01/15/13 97%  10/14/12 96%

## 2013-02-13 NOTE — Assessment & Plan Note (Signed)
Ok for [employer], allegra prn,  to f/u any worsening symptoms or concerns

## 2013-03-14 ENCOUNTER — Other Ambulatory Visit: Payer: Self-pay | Admitting: Internal Medicine

## 2013-04-16 ENCOUNTER — Ambulatory Visit: Payer: 59 | Admitting: Internal Medicine

## 2013-04-28 ENCOUNTER — Encounter: Payer: Self-pay | Admitting: Internal Medicine

## 2013-04-28 ENCOUNTER — Ambulatory Visit (INDEPENDENT_AMBULATORY_CARE_PROVIDER_SITE_OTHER): Payer: PRIVATE HEALTH INSURANCE | Admitting: Internal Medicine

## 2013-04-28 VITALS — BP 110/80 | HR 54 | Temp 97.9°F | Ht 70.0 in | Wt 288.5 lb

## 2013-04-28 DIAGNOSIS — J019 Acute sinusitis, unspecified: Secondary | ICD-10-CM | POA: Insufficient documentation

## 2013-04-28 DIAGNOSIS — I10 Essential (primary) hypertension: Secondary | ICD-10-CM

## 2013-04-28 DIAGNOSIS — R7302 Impaired glucose tolerance (oral): Secondary | ICD-10-CM

## 2013-04-28 DIAGNOSIS — R7309 Other abnormal glucose: Secondary | ICD-10-CM

## 2013-04-28 MED ORDER — LEVOFLOXACIN 250 MG PO TABS: 250.0000 mg | ORAL_TABLET | Freq: Every day | ORAL | Status: DC

## 2013-04-28 NOTE — Assessment & Plan Note (Signed)
Mild to mod, for antibx course,  to f/u any worsening symptoms or concerns 

## 2013-04-28 NOTE — Patient Instructions (Signed)
Please take all new medication as prescribed - the antibiotic Please continue all other medications as before, and refills have been done if requested. Please have the pharmacy call with any other refills you may need. You can also take Delsym OTC for cough, and/or Mucinex (or it's generic off brand) for congestion, and tylenol as needed for pain.  Please remember to sign up for My Chart if you have not done so, as this will be important to you in the future with finding out test results, communicating by private email, and scheduling acute appointments online when needed.   

## 2013-04-28 NOTE — Progress Notes (Signed)
Subjective:    Patient ID: Troy Palmer, male    DOB: Jul 23, 1946, 67 y.o.   MRN: [id number]  HPI   Here with 2-3 days acute onset fever, facial pain, pressure, headache, general weakness and malaise, and greenish d/c, with mild ST and cough, but pt denies chest pain, wheezing, increased sob or doe, orthopnea, PND, increased LE swelling, palpitations, dizziness or syncope. Pt denies new neurological symptoms such as new headache, or facial or extremity weakness or numbness   Pt denies polydipsia, polyuria.   Past Medical History  Diagnosis Date  . TB SKIN TEST, POSITIVE 03/28/2007  . SLEEP APNEA, OBSTRUCTIVE, MODERATE 04/09/2007  . RENAL INSUFFICIENCY 03/15/2010  . PVD WITH CLAUDICATION 02/10/2010  . Polymyalgia rheumatica 07/26/2008  . POLYARTHRALGIA 07/13/2008  . Other dysphagia 11/21/2009  . Osteoarth NOS-Unspec 03/28/2007  . LUNG NODULE 12/04/2007  . LOW BACK PAIN 04/09/2007  . LEG PAIN, BILATERAL 01/25/2010  . HYPERTENSION 03/28/2007  . HYPERLIPIDEMIA 12/04/2007  . HEMORRHOIDS, RECURRENT 02/11/2008  . GERD 12/04/2007  . ESOPHAGEAL STRICTURE 05/26/2008  . ERECTILE DYSFUNCTION 04/09/2007  . DYSPHAGIA UNSPECIFIED 03/16/2008  . DIVERTICULOSIS, COLON 12/04/2007  . DEPRESSION 12/04/2007  . Degeneration of cervical intervertebral disc 03/28/2007  . COLONIC POLYPS, HX OF 12/04/2007  . CONSTIPATION 09/25/2010  . CEREBROVASCULAR ACCIDENT, HX OF 07/17/2010  . BENIGN PROSTATIC HYPERTROPHY 04/09/2007  . BACK PAIN 09/16/2009  . ASTHMA 12/04/2007  . ALLERGIC RHINITIS 04/09/2007   Past Surgical History  Procedure Laterality Date  . Total knee arthroplasty      x 2  . Rotator cuff repair      Left    reports that he has quit smoking. He has never used smokeless tobacco. He reports that he does not drink alcohol or use illicit drugs. family history includes Asthma in his other; Heart disease in his father; Hypertension in his brother; Lupus in his brother. Allergies  Allergen Reactions  . Ace Inhibitors  Swelling    Angioedema throat  . Sulfa Antibiotics Hives    And facial angioedema   Current Outpatient Prescriptions on File Prior to Visit  Medication Sig Dispense Refill  . allopurinol (ZYLOPRIM) 100 MG tablet Take 1 tablet (100 mg total) by mouth daily.  90 tablet  3  . atorvastatin (LIPITOR) 40 MG tablet Take 1 tablet (40 mg total) by mouth daily.  90 tablet  3  . bisacodyl (BISACODYL) 5 MG EC tablet Take 10 mg by mouth at bedtime as needed. For constipation      . BYSTOLIC 10 MG tablet take 1 tablet by mouth once daily  30 tablet  11  . fluticasone (FLONASE) 50 MCG/ACT nasal spray Place 2 sprays into the nose daily.  16 g  6  . furosemide (LASIX) 40 MG tablet Take 1 tablet (40 mg total) by mouth daily.  90 tablet  3  . HYDROcodone-acetaminophen (NORCO/VICODIN) 5-325 MG per tablet Take 1 tablet by mouth every 6 (six) hours as needed for pain.  15 tablet  0  . hydrOXYzine (ATARAX/VISTARIL) 25 MG tablet Take 1-2 tabs by mouth every 6 hrs  40 tablet  1  . ibuprofen (ADVIL,MOTRIN) 600 MG tablet Take 1 tablet (600 mg total) by mouth every 8 (eight) hours as needed for pain.  30 tablet  0  . methylPREDNISolone (MEDROL, PAK,) 4 MG tablet follow package directions  21 tablet  0  . tamsulosin (FLOMAX) 0.4 MG CAPS take 1 capsule by mouth once daily  90 capsule  3  .  Tamsulosin HCl (FLOMAX) 0.4 MG CAPS Take 0.4 mg by mouth daily.        No current facility-administered medications on file prior to visit.      Review of Systems  Constitutional: Negative for unexpected weight change, or unusual diaphoresis  HENT: Negative for tinnitus.   Eyes: Negative for photophobia and visual disturbance.  Respiratory: Negative for choking and stridor.   Gastrointestinal: Negative for vomiting and blood in stool.  Genitourinary: Negative for hematuria and decreased urine volume.  Musculoskeletal: Negative for acute joint swelling Skin: Negative for color change and wound.  Neurological: Negative for  tremors and numbness other than noted  Psychiatric/Behavioral: Negative for decreased concentration or  hyperactivity.       Objective:   Physical Exam BP 110/80  Pulse 54  Temp(Src) 97.9 F (36.6 C) (Oral)  Ht 5\' 10"  (1.778 m)  Wt 288 lb 8 oz (130.863 kg)  BMI 41.4 kg/m2  SpO2 96% VS noted, mild ill Constitutional: Pt appears well-developed and well-nourished.  HENT: Head: NCAT.  Right Ear: External ear normal.  Left Ear: External ear normal.  Bilat tm's with mild erythema.  Max sinus areas mod tender.  Pharynx with mild erythema, no exudate' Eyes: Conjunctivae and EOM are normal. Pupils are equal, round, and reactive to light.  Neck: Normal range of motion. Neck supple.  Cardiovascular: Normal rate and regular rhythm.   Pulmonary/Chest: Effort normal and breath sounds normal.  Neurological: Pt is alert. Not confused  Skin: Skin is warm. No erythema.  Psychiatric: Pt behavior is normal. Thought content normal.     Assessment & Plan:

## 2013-04-28 NOTE — Assessment & Plan Note (Signed)
stable overall by history and exam, recent data reviewed with pt, and pt to continue medical treatment as before,  to f/u any worsening symptoms or concerns Lab Results  Component Value Date   HGBA1C 5.8 10/14/2012

## 2013-04-28 NOTE — Assessment & Plan Note (Signed)
stable overall by history and exam, recent data reviewed with pt, and pt to continue medical treatment as before,  to f/u any worsening symptoms or concerns BP Readings from Last 3 Encounters:  04/28/13 110/80  02/10/13 138/70  01/15/13 130/70

## 2013-05-04 ENCOUNTER — Emergency Department (HOSPITAL_COMMUNITY)
Admission: EM | Admit: 2013-05-04 | Discharge: 2013-05-04 | Disposition: A | Discharge status: Home / Self Care | Payer: PRIVATE HEALTH INSURANCE | Attending: Emergency Medicine | Admitting: Emergency Medicine

## 2013-05-04 ENCOUNTER — Encounter (HOSPITAL_COMMUNITY): Payer: Self-pay

## 2013-05-04 DIAGNOSIS — I10 Essential (primary) hypertension: Secondary | ICD-10-CM | POA: Insufficient documentation

## 2013-05-04 DIAGNOSIS — Z860100 Personal history of colon polyps, unspecified: Secondary | ICD-10-CM | POA: Insufficient documentation

## 2013-05-04 DIAGNOSIS — IMO0002 Reserved for concepts with insufficient information to code with codable children: Secondary | ICD-10-CM | POA: Insufficient documentation

## 2013-05-04 DIAGNOSIS — Z87448 Personal history of other diseases of urinary system: Secondary | ICD-10-CM | POA: Insufficient documentation

## 2013-05-04 DIAGNOSIS — Z8659 Personal history of other mental and behavioral disorders: Secondary | ICD-10-CM | POA: Insufficient documentation

## 2013-05-04 DIAGNOSIS — J45909 Unspecified asthma, uncomplicated: Secondary | ICD-10-CM | POA: Insufficient documentation

## 2013-05-04 DIAGNOSIS — Z8739 Personal history of other diseases of the musculoskeletal system and connective tissue: Secondary | ICD-10-CM | POA: Insufficient documentation

## 2013-05-04 DIAGNOSIS — Z792 Long term (current) use of antibiotics: Secondary | ICD-10-CM | POA: Insufficient documentation

## 2013-05-04 DIAGNOSIS — E785 Hyperlipidemia, unspecified: Secondary | ICD-10-CM | POA: Insufficient documentation

## 2013-05-04 DIAGNOSIS — Z8669 Personal history of other diseases of the nervous system and sense organs: Secondary | ICD-10-CM | POA: Insufficient documentation

## 2013-05-04 DIAGNOSIS — Z87891 Personal history of nicotine dependence: Secondary | ICD-10-CM | POA: Insufficient documentation

## 2013-05-04 DIAGNOSIS — Z79899 Other long term (current) drug therapy: Secondary | ICD-10-CM | POA: Insufficient documentation

## 2013-05-04 DIAGNOSIS — Z8611 Personal history of tuberculosis: Secondary | ICD-10-CM | POA: Insufficient documentation

## 2013-05-04 DIAGNOSIS — M109 Gout, unspecified: Secondary | ICD-10-CM

## 2013-05-04 MED ORDER — HYDROCODONE-ACETAMINOPHEN 5-325 MG PO TABS
2.0000 | ORAL_TABLET | Freq: Once | ORAL | Status: AC
  Administered 2013-05-04: 2 via ORAL
  Filled 2013-05-04: qty 2

## 2013-05-04 MED ORDER — PREDNISONE 20 MG PO TABS: ORAL_TABLET | ORAL | Status: DC

## 2013-05-04 MED ORDER — PREDNISONE 20 MG PO TABS
60.0000 mg | ORAL_TABLET | Freq: Once | ORAL | Status: DC
  Filled 2013-05-04: qty 3

## 2013-05-04 MED ORDER — HYDROCODONE-ACETAMINOPHEN 5-325 MG PO TABS: 1.0000 | ORAL_TABLET | Freq: Four times a day (QID) | ORAL | Status: DC | PRN

## 2013-05-04 NOTE — ED Notes (Signed)
Pt c/o rt foot pain after wearing new shoes to church yesterday, states hx of gout

## 2013-05-04 NOTE — ED Provider Notes (Signed)
CSN: [id number]     Arrival date & time 05/04/13  [id number] History   First MD Initiated Contact with Patient 05/04/13 0820     Chief Complaint  Patient presents with  . Foot Pain   (Consider location/radiation/quality/duration/timing/severity/associated sxs/prior Treatment) Patient is a 67 y.o. male presenting with lower extremity pain. The history is provided by the patient.  Foot Pain  pt w hx gout, c/o right great toe/base of great toe area pain for the past couple days. Gradual onset. Constant. Moderate. Worse w palpation, movement, walking, wearing shoe that presses on area. Feels same as prior gout flare. No recent trauma or twisting injury. No breaks in skin, wounds or abrasions. No redness. No numbness. No fever or chills. No other joint swelling or pain. No recent change in diet or meds.      Past Medical History  Diagnosis Date  . TB SKIN TEST, POSITIVE 03/28/2007  . SLEEP APNEA, OBSTRUCTIVE, MODERATE 04/09/2007  . RENAL INSUFFICIENCY 03/15/2010  . PVD WITH CLAUDICATION 02/10/2010  . Polymyalgia rheumatica 07/26/2008  . POLYARTHRALGIA 07/13/2008  . Other dysphagia 11/21/2009  . Osteoarth NOS-Unspec 03/28/2007  . LUNG NODULE 12/04/2007  . LOW BACK PAIN 04/09/2007  . LEG PAIN, BILATERAL 01/25/2010  . HYPERTENSION 03/28/2007  . HYPERLIPIDEMIA 12/04/2007  . HEMORRHOIDS, RECURRENT 02/11/2008  . GERD 12/04/2007  . ESOPHAGEAL STRICTURE 05/26/2008  . ERECTILE DYSFUNCTION 04/09/2007  . DYSPHAGIA UNSPECIFIED 03/16/2008  . DIVERTICULOSIS, COLON 12/04/2007  . DEPRESSION 12/04/2007  . Degeneration of cervical intervertebral disc 03/28/2007  . COLONIC POLYPS, HX OF 12/04/2007  . CONSTIPATION 09/25/2010  . CEREBROVASCULAR ACCIDENT, HX OF 07/17/2010  . BENIGN PROSTATIC HYPERTROPHY 04/09/2007  . BACK PAIN 09/16/2009  . ASTHMA 12/04/2007  . ALLERGIC RHINITIS 04/09/2007   Past Surgical History  Procedure Laterality Date  . Total knee arthroplasty      x 2  . Rotator cuff repair      Left   Family History   Problem Relation Age of Onset  . Heart disease Father   . Lupus Brother   . Hypertension Brother   . Asthma Other    History  Substance Use Topics  . Smoking status: Former Research scientist (life sciences)  . Smokeless tobacco: Never Used  . Alcohol Use: No    Review of Systems  Constitutional: Negative for fever and chills.  Cardiovascular: Negative for leg swelling.  Skin: Negative for wound.  Neurological: Negative for numbness.    Allergies  Ace inhibitors and Sulfa antibiotics  Home Medications   Current Outpatient Rx  Name  Route  Sig  Dispense  Refill  . allopurinol (ZYLOPRIM) 100 MG tablet   Oral   Take 1 tablet (100 mg total) by mouth daily.   90 tablet   3   . atorvastatin (LIPITOR) 40 MG tablet   Oral   Take 1 tablet (40 mg total) by mouth daily.   90 tablet   3   . bisacodyl (BISACODYL) 5 MG EC tablet   Oral   Take 10 mg by mouth at bedtime as needed. For constipation         . BYSTOLIC 10 MG tablet      take 1 tablet by mouth once daily   30 tablet   11   . fluticasone (FLONASE) 50 MCG/ACT nasal spray   Nasal   Place 2 sprays into the nose daily.   16 g   6   . furosemide (LASIX) 40 MG tablet   Oral   Take 1  tablet (40 mg total) by mouth daily.   90 tablet   3   . HYDROcodone-acetaminophen (NORCO/VICODIN) 5-325 MG per tablet   Oral   Take 1 tablet by mouth every 6 (six) hours as needed for pain.   15 tablet   0   . hydrOXYzine (ATARAX/VISTARIL) 25 MG tablet      Take 1-2 tabs by mouth every 6 hrs   40 tablet   1   . ibuprofen (ADVIL,MOTRIN) 600 MG tablet   Oral   Take 1 tablet (600 mg total) by mouth every 8 (eight) hours as needed for pain.   30 tablet   0   . levofloxacin (LEVAQUIN) 250 MG tablet   Oral   Take 1 tablet (250 mg total) by mouth daily.   10 tablet   0   . methylPREDNISolone (MEDROL, PAK,) 4 MG tablet      follow package directions   21 tablet   0   . tamsulosin (FLOMAX) 0.4 MG CAPS      take 1 capsule by mouth once  daily   90 capsule   3   . Tamsulosin HCl (FLOMAX) 0.4 MG CAPS   Oral   Take 0.4 mg by mouth daily.           BP 184/63  Pulse 56  Temp(Src) 98.1 F (36.7 C) (Oral)  Resp 20  SpO2 96% Physical Exam  Nursing note and vitals reviewed. Constitutional: He is oriented to person, place, and time. He appears well-developed and well-nourished. No distress.  HENT:  Head: Atraumatic.  Eyes: Conjunctivae are normal.  Neck: Neck supple. No tracheal deviation present.  Cardiovascular: Normal rate.   Pulmonary/Chest: Effort normal. No accessory muscle usage. No respiratory distress.  Abdominal: He exhibits no distension.  Musculoskeletal: Normal range of motion. He exhibits no edema.  Right great toe/right mtp region pain, tenderness, mild swelling, and mild inc warmth. No cellulitis. No ingrown nail. Normal cap refill distally. Dp/pt2+. Skin intact.   Neurological: He is alert and oriented to person, place, and time.  Skin: Skin is warm and dry. He is not diaphoretic.  Psychiatric: He has a normal mood and affect.    ED Course  Procedures (including critical care time)  MDM  Pt took cab here, does not have to drive.  vicodin po. pred po.  Pt afeb. No cellulitis. Hx gout and states feels same. No hx trauma.   Pt appears stable for d/c.      Mirna Mires, MD 05/04/13 [phone]

## 2013-07-01 ENCOUNTER — Encounter (HOSPITAL_COMMUNITY): Payer: Self-pay | Admitting: Emergency Medicine

## 2013-07-01 ENCOUNTER — Emergency Department (HOSPITAL_COMMUNITY): Payer: PRIVATE HEALTH INSURANCE

## 2013-07-01 ENCOUNTER — Emergency Department (HOSPITAL_COMMUNITY)
Admission: EM | Admit: 2013-07-01 | Discharge: 2013-07-01 | Disposition: A | Discharge status: Home / Self Care | Payer: PRIVATE HEALTH INSURANCE | Attending: Emergency Medicine | Admitting: Emergency Medicine

## 2013-07-01 DIAGNOSIS — M1990 Unspecified osteoarthritis, unspecified site: Secondary | ICD-10-CM | POA: Insufficient documentation

## 2013-07-01 DIAGNOSIS — S60229A Contusion of unspecified hand, initial encounter: Secondary | ICD-10-CM | POA: Insufficient documentation

## 2013-07-01 DIAGNOSIS — Z79899 Other long term (current) drug therapy: Secondary | ICD-10-CM | POA: Diagnosis not present

## 2013-07-01 DIAGNOSIS — Z87448 Personal history of other diseases of urinary system: Secondary | ICD-10-CM | POA: Diagnosis not present

## 2013-07-01 DIAGNOSIS — Z8673 Personal history of transient ischemic attack (TIA), and cerebral infarction without residual deficits: Secondary | ICD-10-CM | POA: Insufficient documentation

## 2013-07-01 DIAGNOSIS — Z87891 Personal history of nicotine dependence: Secondary | ICD-10-CM | POA: Diagnosis not present

## 2013-07-01 DIAGNOSIS — Z792 Long term (current) use of antibiotics: Secondary | ICD-10-CM | POA: Insufficient documentation

## 2013-07-01 DIAGNOSIS — Z8659 Personal history of other mental and behavioral disorders: Secondary | ICD-10-CM | POA: Diagnosis not present

## 2013-07-01 DIAGNOSIS — Z860100 Personal history of colon polyps, unspecified: Secondary | ICD-10-CM | POA: Insufficient documentation

## 2013-07-01 DIAGNOSIS — Z8719 Personal history of other diseases of the digestive system: Secondary | ICD-10-CM | POA: Diagnosis not present

## 2013-07-01 DIAGNOSIS — I10 Essential (primary) hypertension: Secondary | ICD-10-CM | POA: Insufficient documentation

## 2013-07-01 DIAGNOSIS — S60211A Contusion of right wrist, initial encounter: Secondary | ICD-10-CM

## 2013-07-01 DIAGNOSIS — IMO0002 Reserved for concepts with insufficient information to code with codable children: Secondary | ICD-10-CM | POA: Insufficient documentation

## 2013-07-01 DIAGNOSIS — E785 Hyperlipidemia, unspecified: Secondary | ICD-10-CM | POA: Insufficient documentation

## 2013-07-01 DIAGNOSIS — S6990XA Unspecified injury of unspecified wrist, hand and finger(s), initial encounter: Secondary | ICD-10-CM | POA: Diagnosis present

## 2013-07-01 DIAGNOSIS — J45909 Unspecified asthma, uncomplicated: Secondary | ICD-10-CM | POA: Diagnosis not present

## 2013-07-01 DIAGNOSIS — R296 Repeated falls: Secondary | ICD-10-CM | POA: Diagnosis not present

## 2013-07-01 DIAGNOSIS — S60219A Contusion of unspecified wrist, initial encounter: Secondary | ICD-10-CM | POA: Diagnosis not present

## 2013-07-01 DIAGNOSIS — Y939 Activity, unspecified: Secondary | ICD-10-CM | POA: Insufficient documentation

## 2013-07-01 DIAGNOSIS — H18419 Arcus senilis, unspecified eye: Secondary | ICD-10-CM | POA: Insufficient documentation

## 2013-07-01 DIAGNOSIS — Y929 Unspecified place or not applicable: Secondary | ICD-10-CM | POA: Diagnosis not present

## 2013-07-01 DIAGNOSIS — S60221A Contusion of right hand, initial encounter: Secondary | ICD-10-CM

## 2013-07-01 NOTE — ED Notes (Signed)
Pt states he was falling forward and blocked the fall with his rt hand, swelling and tenderness noted

## 2013-07-01 NOTE — ED Provider Notes (Signed)
CSN: [id number]     Arrival date & time 07/01/13  [id number] History   First MD Initiated Contact with Patient 07/01/13 1021     Chief Complaint  Patient presents with  . Hand Injury   (Consider location/radiation/quality/duration/timing/severity/associated sxs/prior Treatment) Patient is a 67 y.o. male presenting with hand injury. The history is provided by the patient.  Hand Injury Location:  Wrist and hand Time since incident:  12 hours Injury: yes   Mechanism of injury: fall   Fall:    Fall occurred:  Standing   Height of fall:  Standing   Impact surface:  Hard floor   Point of impact:  Hands   Entrapped after fall: no   Wrist location:  R wrist Hand location:  R hand Pain details:    Quality:  Aching, pressure, sharp and throbbing   Radiates to:  Does not radiate   Severity:  Moderate   Onset quality:  Sudden   Duration:  12 hours   Timing:  Constant   Progression:  Unchanged Chronicity:  New Handedness:  Right-handed Dislocation: no   Foreign body present:  No foreign bodies Prior injury to area:  No Relieved by:  NSAIDs Worsened by:  Movement and stretching area Ineffective treatments:  None tried Associated symptoms: decreased range of motion   Associated symptoms: no back pain, no fatigue, no fever, no muscle weakness, no neck pain, no numbness, no stiffness, no swelling and no tingling     Past Medical History  Diagnosis Date  . TB SKIN TEST, POSITIVE 03/28/2007  . SLEEP APNEA, OBSTRUCTIVE, MODERATE 04/09/2007  . RENAL INSUFFICIENCY 03/15/2010  . PVD WITH CLAUDICATION 02/10/2010  . Polymyalgia rheumatica 07/26/2008  . POLYARTHRALGIA 07/13/2008  . Other dysphagia 11/21/2009  . Osteoarth NOS-Unspec 03/28/2007  . LUNG NODULE 12/04/2007  . LOW BACK PAIN 04/09/2007  . LEG PAIN, BILATERAL 01/25/2010  . HYPERTENSION 03/28/2007  . HYPERLIPIDEMIA 12/04/2007  . HEMORRHOIDS, RECURRENT 02/11/2008  . GERD 12/04/2007  . ESOPHAGEAL STRICTURE 05/26/2008  . ERECTILE DYSFUNCTION  04/09/2007  . DYSPHAGIA UNSPECIFIED 03/16/2008  . DIVERTICULOSIS, COLON 12/04/2007  . DEPRESSION 12/04/2007  . Degeneration of cervical intervertebral disc 03/28/2007  . COLONIC POLYPS, HX OF 12/04/2007  . CONSTIPATION 09/25/2010  . CEREBROVASCULAR ACCIDENT, HX OF 07/17/2010  . BENIGN PROSTATIC HYPERTROPHY 04/09/2007  . BACK PAIN 09/16/2009  . ASTHMA 12/04/2007  . ALLERGIC RHINITIS 04/09/2007   Past Surgical History  Procedure Laterality Date  . Total knee arthroplasty      x 2  . Rotator cuff repair      Left   Family History  Problem Relation Age of Onset  . Heart disease Father   . Lupus Brother   . Hypertension Brother   . Asthma Other    History  Substance Use Topics  . Smoking status: Former Research scientist (life sciences)  . Smokeless tobacco: Never Used  . Alcohol Use: No    Review of Systems  Constitutional: Negative for fever and fatigue.  Musculoskeletal: Negative for back pain, neck pain and stiffness.    Allergies  Ace inhibitors and Sulfa antibiotics  Home Medications   Current Outpatient Rx  Name  Route  Sig  Dispense  Refill  . atorvastatin (LIPITOR) 40 MG tablet   Oral   Take 40 mg by mouth daily.         . fluticasone (FLONASE) 50 MCG/ACT nasal spray   Nasal   Place 2 sprays into the nose daily.         Marland Kitchen  furosemide (LASIX) 40 MG tablet   Oral   Take 40 mg by mouth daily.         Marland Kitchen HYDROcodone-acetaminophen (NORCO/VICODIN) 5-325 MG per tablet   Oral   Take 1-2 tablets by mouth every 6 (six) hours as needed for pain.   20 tablet   0   . ibuprofen (ADVIL,MOTRIN) 600 MG tablet   Oral   Take 600 mg by mouth every 8 (eight) hours as needed for pain.         Marland Kitchen levofloxacin (LEVAQUIN) 250 MG tablet   Oral   Take 250 mg by mouth daily.         . nebivolol (BYSTOLIC) 10 MG tablet   Oral   Take 10 mg by mouth daily.         . predniSONE (DELTASONE) 20 MG tablet      3 po once a day for 2 days, then 2 po once a day for 3 days, then 1 po once a day for 3  days   15 tablet   0   . Tamsulosin HCl (FLOMAX) 0.4 MG CAPS   Oral   Take 0.4 mg by mouth daily.           BP 182/84  Pulse 57  Temp(Src) 97.9 F (36.6 C) (Oral)  Resp 20  SpO2 98% Physical Exam  Constitutional: He is oriented to person, place, and time. He appears well-developed and well-nourished. No distress.  HENT:  Head: Normocephalic.  Mouth/Throat: Oropharynx is clear and moist. No oropharyngeal exudate.  Eyes: Conjunctivae and EOM are normal. Pupils are equal, round, and reactive to light.  Arcus senilis  Cardiovascular: Normal rate, regular rhythm, normal heart sounds and intact distal pulses.  Exam reveals no gallop and no friction rub.   No murmur heard. Pulmonary/Chest: Effort normal and breath sounds normal. No respiratory distress. He has no wheezes. He has no rales.  Abdominal: Soft. Bowel sounds are normal. He exhibits no distension. There is no tenderness. There is no rebound.  Musculoskeletal: He exhibits edema and tenderness.  Swelling of right hand and right wrist. Tender to palpation in the 4th metcarpal area as well as the distal radus and ulna.  Neurological: He is alert and oriented to person, place, and time.  Skin: He is not diaphoretic.  Psychiatric: He has a normal mood and affect. His behavior is normal.    ED Course  Procedures (including critical care time) Labs Review Labs Reviewed - No data to display Imaging Review Dg Wrist Complete Right  07/01/2013   CLINICAL DATA:  Fall.  Pain.  EXAM: RIGHT WRIST - COMPLETE 3+ VIEW  COMPARISON:  Right hand series 06/30/2013.  FINDINGS: Soft tissue structures are unremarkable. No evidence of fracture or dislocation. Degenerative changes are noted about the wrist. There is prominent degenerative spurring and subchondral lucency at the base of the right 1st metacarpal. Subchondral lucencies noted about the trapezium consistent with degenerative change.  IMPRESSION: No acute abnormality identified. No  evidence of fracture or dislocation.   Electronically Signed   By: [place]   On: 07/01/2013 10:53   Dg Hand Complete Right  07/01/2013   CLINICAL DATA:  Fall.  Pain.  EXAM: RIGHT HAND - COMPLETE 3+ VIEW  COMPARISON:  None.  FINDINGS: No evidence of fracture or dislocation. Mild diffuse degenerative change noted about the hand and wrist. Soft tissues are unremarkable. No foreign body.  IMPRESSION: No acute abnormality.  No evidence of fracture dislocation.  Electronically Signed   By: Marcello Moores  Register   On: 07/01/2013 10:49    EKG Interpretation   None       MDM   1. Hand contusion, right, initial encounter   2. Wrist contusion, right, initial encounter     1. Hand and wrist contusion No snuff box tenderness. It appears that the patient suffered a contusion of the right hand and wrist. The patient states that he does not want pain medications, and that ibuprofen OTC works well for him. I ordered a wrist splint for the patient. I instructed the patient to see his PCP for f/u regarding this injury. The patient is in agreement with this plan.     Marrion Coy, MD 07/01/13 1110

## 2013-07-02 NOTE — ED Provider Notes (Signed)
I saw and evaluated the patient, reviewed the resident's note and I agree with the findings and plan.  EKG Interpretation   None       Pt with mechanical fall.  No fracture noted on xray.  Plan to splint and refer to ortho  Kathalene Frames, MD 07/02/13 [phone]

## 2013-07-25 ENCOUNTER — Emergency Department (HOSPITAL_COMMUNITY): Payer: PRIVATE HEALTH INSURANCE

## 2013-07-25 ENCOUNTER — Encounter (HOSPITAL_COMMUNITY): Payer: Self-pay | Admitting: Emergency Medicine

## 2013-07-25 ENCOUNTER — Emergency Department (HOSPITAL_COMMUNITY)
Admission: EM | Admit: 2013-07-25 | Discharge: 2013-07-25 | Disposition: A | Discharge status: Home / Self Care | Payer: PRIVATE HEALTH INSURANCE | Attending: Emergency Medicine | Admitting: Emergency Medicine

## 2013-07-25 DIAGNOSIS — Z79899 Other long term (current) drug therapy: Secondary | ICD-10-CM | POA: Insufficient documentation

## 2013-07-25 DIAGNOSIS — Z8659 Personal history of other mental and behavioral disorders: Secondary | ICD-10-CM | POA: Diagnosis not present

## 2013-07-25 DIAGNOSIS — Z8719 Personal history of other diseases of the digestive system: Secondary | ICD-10-CM | POA: Insufficient documentation

## 2013-07-25 DIAGNOSIS — N400 Enlarged prostate without lower urinary tract symptoms: Secondary | ICD-10-CM | POA: Insufficient documentation

## 2013-07-25 DIAGNOSIS — Z8739 Personal history of other diseases of the musculoskeletal system and connective tissue: Secondary | ICD-10-CM | POA: Insufficient documentation

## 2013-07-25 DIAGNOSIS — Z87891 Personal history of nicotine dependence: Secondary | ICD-10-CM | POA: Diagnosis not present

## 2013-07-25 DIAGNOSIS — Z8611 Personal history of tuberculosis: Secondary | ICD-10-CM | POA: Diagnosis not present

## 2013-07-25 DIAGNOSIS — J45909 Unspecified asthma, uncomplicated: Secondary | ICD-10-CM | POA: Diagnosis not present

## 2013-07-25 DIAGNOSIS — I10 Essential (primary) hypertension: Secondary | ICD-10-CM

## 2013-07-25 DIAGNOSIS — Z8673 Personal history of transient ischemic attack (TIA), and cerebral infarction without residual deficits: Secondary | ICD-10-CM | POA: Insufficient documentation

## 2013-07-25 DIAGNOSIS — E785 Hyperlipidemia, unspecified: Secondary | ICD-10-CM | POA: Insufficient documentation

## 2013-07-25 DIAGNOSIS — IMO0002 Reserved for concepts with insufficient information to code with codable children: Secondary | ICD-10-CM | POA: Insufficient documentation

## 2013-07-25 DIAGNOSIS — J069 Acute upper respiratory infection, unspecified: Secondary | ICD-10-CM | POA: Insufficient documentation

## 2013-07-25 DIAGNOSIS — Z860100 Personal history of colon polyps, unspecified: Secondary | ICD-10-CM | POA: Insufficient documentation

## 2013-07-25 DIAGNOSIS — J029 Acute pharyngitis, unspecified: Secondary | ICD-10-CM | POA: Diagnosis present

## 2013-07-25 DIAGNOSIS — Z8669 Personal history of other diseases of the nervous system and sense organs: Secondary | ICD-10-CM | POA: Insufficient documentation

## 2013-07-25 LAB — BASIC METABOLIC PANEL
BUN: 23 mg/dL (ref 6–23)
Calcium: 9.3 mg/dL (ref 8.4–10.5)
Chloride: 105 mEq/L (ref 96–112)
Creatinine, Ser: 1.51 mg/dL — ABNORMAL HIGH (ref 0.50–1.35)
GFR calc Af Amer: 53 mL/min — ABNORMAL LOW (ref >90)
GFR calc non Af Amer: 46 mL/min — ABNORMAL LOW (ref >90)
Potassium: 4.2 mEq/L (ref 3.5–5.1)

## 2013-07-25 LAB — CBC
HCT: 38.9 % — ABNORMAL LOW (ref 39.0–52.0)
MCH: 28.3 pg (ref 26.0–34.0)
MCHC: 33.4 g/dL (ref 30.0–36.0)
MCV: 84.7 fL (ref 78.0–100.0)
Platelets: 228 10*3/uL (ref 150–400)
RDW: 13 % (ref 11.5–15.5)
WBC: 7.5 10*3/uL (ref 4.0–10.5)

## 2013-07-25 MED ORDER — GUAIFENESIN ER 600 MG PO TB12: 600.0000 mg | ORAL_TABLET | Freq: Two times a day (BID) | ORAL | Status: DC | PRN

## 2013-07-25 MED ORDER — IBUPROFEN 800 MG PO TABS: 800.0000 mg | ORAL_TABLET | Freq: Three times a day (TID) | ORAL | Status: DC

## 2013-07-25 MED ORDER — NEBIVOLOL HCL 10 MG PO TABS
10.0000 mg | ORAL_TABLET | Freq: Every day | ORAL | Status: DC
  Filled 2013-07-25: qty 1

## 2013-07-25 NOTE — ED Provider Notes (Signed)
CSN: [id number]     Arrival date & time 07/25/13  1829 History   First MD Initiated Contact with Patient 07/25/13 2054     Chief Complaint  Patient presents with  . Sore Throat  . Hypertension   (Consider location/radiation/quality/duration/timing/severity/associated sxs/prior Treatment) The history is provided by the patient.  Troy Palmer is a 67 y.o. male history is male insufficiency, hypertension, hyperlipidemia here presenting sore throat. Sore throat since yesterday. States that he has a history of strep. Denies any fevers or chills. He has some nonproductive cough since using over-the-counter medicines for a sinus congestion. He was noted to be hypotensive  215/110 in triage. Denies chest pain or shortness of breath or abdominal pain.    Past Medical History  Diagnosis Date  . TB SKIN TEST, POSITIVE 03/28/2007  . SLEEP APNEA, OBSTRUCTIVE, MODERATE 04/09/2007  . RENAL INSUFFICIENCY 03/15/2010  . PVD WITH CLAUDICATION 02/10/2010  . Polymyalgia rheumatica 07/26/2008  . POLYARTHRALGIA 07/13/2008  . Other dysphagia 11/21/2009  . Osteoarth NOS-Unspec 03/28/2007  . LUNG NODULE 12/04/2007  . LOW BACK PAIN 04/09/2007  . LEG PAIN, BILATERAL 01/25/2010  . HYPERTENSION 03/28/2007  . HYPERLIPIDEMIA 12/04/2007  . HEMORRHOIDS, RECURRENT 02/11/2008  . GERD 12/04/2007  . ESOPHAGEAL STRICTURE 05/26/2008  . ERECTILE DYSFUNCTION 04/09/2007  . DYSPHAGIA UNSPECIFIED 03/16/2008  . DIVERTICULOSIS, COLON 12/04/2007  . DEPRESSION 12/04/2007  . Degeneration of cervical intervertebral disc 03/28/2007  . COLONIC POLYPS, HX OF 12/04/2007  . CONSTIPATION 09/25/2010  . CEREBROVASCULAR ACCIDENT, HX OF 07/17/2010  . BENIGN PROSTATIC HYPERTROPHY 04/09/2007  . BACK PAIN 09/16/2009  . ASTHMA 12/04/2007  . ALLERGIC RHINITIS 04/09/2007   Past Surgical History  Procedure Laterality Date  . Total knee arthroplasty      x 2  . Rotator cuff repair      Left   Family History  Problem Relation Age of Onset  . Heart disease  Father   . Lupus Brother   . Hypertension Brother   . Asthma Other    History  Substance Use Topics  . Smoking status: Former Research scientist (life sciences)  . Smokeless tobacco: Never Used  . Alcohol Use: No    Review of Systems  HENT: Positive for sore throat.   Respiratory: Positive for cough.   All other systems reviewed and are negative.    Allergies  Ace inhibitors and Sulfa antibiotics  Home Medications   Current Outpatient Rx  Name  Route  Sig  Dispense  Refill  . atorvastatin (LIPITOR) 40 MG tablet   Oral   Take 40 mg by mouth daily.         . fluticasone (FLONASE) 50 MCG/ACT nasal spray   Nasal   Place 2 sprays into the nose daily as needed for allergies.          . furosemide (LASIX) 40 MG tablet   Oral   Take 40 mg by mouth daily.         . naphazoline-pheniramine (NAPHCON-A) 0.025-0.3 % ophthalmic solution   Both Eyes   Place 1 drop into both eyes 4 (four) times daily as needed for irritation.         . nebivolol (BYSTOLIC) 10 MG tablet   Oral   Take 10 mg by mouth daily.         . Tamsulosin HCl (FLOMAX) 0.4 MG CAPS   Oral   Take 0.4 mg by mouth daily.           BP 180/76  Pulse 57  Temp(Src) 97.8 F (36.6 C) (Oral)  Resp 16  SpO2 98% Physical Exam  Nursing note and vitals reviewed. Constitutional: He is oriented to person, place, and time. He appears well-developed and well-nourished.  HENT:  Head: Normocephalic.  OP slightly red, no tonsillar exudates   Eyes: Conjunctivae are normal. Pupils are equal, round, and reactive to light.  Neck: Normal range of motion. Neck supple.  Cardiovascular: Normal rate, regular rhythm and normal heart sounds.   Pulmonary/Chest: Effort normal and breath sounds normal. No respiratory distress. He has no wheezes. He has no rales.  Abdominal: Soft. Bowel sounds are normal. He exhibits no distension. There is no tenderness. There is no rebound and no guarding.  Musculoskeletal: Normal range of motion.   Lymphadenopathy:    He has no cervical adenopathy.  Neurological: He is alert and oriented to person, place, and time. No cranial nerve deficit. Coordination normal.  Skin: Skin is warm and dry.  Psychiatric: He has a normal mood and affect. His behavior is normal. Judgment and thought content normal.    ED Course  Procedures (including critical care time) Labs Review Labs Reviewed  CBC - Abnormal; Notable for the following:    HCT 38.9 (*)    All other components within normal limits  BASIC METABOLIC PANEL - Abnormal; Notable for the following:    Creatinine, Ser 1.51 (*)    GFR calc non Af Amer 46 (*)    GFR calc Af Amer 53 (*)    All other components within normal limits  RAPID STREP SCREEN  CULTURE, GROUP A STREP   Imaging Review Dg Chest 2 View  07/25/2013   CLINICAL DATA:  Cough.  Chest congestion.  Sore throat.  EXAM: CHEST  2 VIEW  COMPARISON:  05/18/2012  FINDINGS: The heart size and mediastinal contours are within normal limits. Both lungs are clear. The visualized skeletal structures are unremarkable.  IMPRESSION: No active cardiopulmonary disease.   Electronically Signed   By: Earle Gell M.D.   On: 07/25/2013 21:51    EKG Interpretation   None       MDM  No diagnosis found. Troy Palmer is a 67 y.o. male here with sore throat. Low centor score. Rapid strep neg. Labs at baseline. I doubt hypertensive emergency. His BP came down to 180/76 on its own without treatment. I told him to stop the over the counter nasal sprays. Will d/c home on motrin, mucinex. Will have him f/u with PMD regarding HTN.     Wandra Arthurs, MD 07/25/13 2224

## 2013-07-25 NOTE — ED Notes (Signed)
Pt c/o sore throat since yesterday 

## 2013-07-27 LAB — CULTURE, GROUP A STREP

## 2013-07-30 ENCOUNTER — Encounter: Payer: Self-pay | Admitting: Internal Medicine

## 2013-07-30 ENCOUNTER — Ambulatory Visit (INDEPENDENT_AMBULATORY_CARE_PROVIDER_SITE_OTHER): Payer: PRIVATE HEALTH INSURANCE | Admitting: Internal Medicine

## 2013-07-30 VITALS — BP 140/70 | HR 70 | Temp 97.3°F | Wt 296.0 lb

## 2013-07-30 DIAGNOSIS — I10 Essential (primary) hypertension: Secondary | ICD-10-CM

## 2013-07-30 DIAGNOSIS — R7302 Impaired glucose tolerance (oral): Secondary | ICD-10-CM

## 2013-07-30 DIAGNOSIS — R7309 Other abnormal glucose: Secondary | ICD-10-CM

## 2013-07-30 DIAGNOSIS — J019 Acute sinusitis, unspecified: Secondary | ICD-10-CM

## 2013-07-30 DIAGNOSIS — R05 Cough: Secondary | ICD-10-CM | POA: Insufficient documentation

## 2013-07-30 MED ORDER — LEVOFLOXACIN 250 MG PO TABS: 250.0000 mg | ORAL_TABLET | Freq: Every day | ORAL | Status: DC

## 2013-07-30 MED ORDER — HYDROCODONE-HOMATROPINE 5-1.5 MG/5ML PO SYRP: 5.0000 mL | ORAL_SOLUTION | Freq: Four times a day (QID) | ORAL | Status: DC | PRN

## 2013-07-30 NOTE — Patient Instructions (Signed)
Please take all new medication as prescribed Please continue all other medications as before, and refills have been done if requested. Please have the pharmacy call with any other refills you may need.  You can also take Mucinex (or it's generic off brand) for congestion, and tylenol as needed for pain.

## 2013-07-30 NOTE — Assessment & Plan Note (Signed)
Mild to mod, for antibx course,  to f/u any worsening symptoms or concerns 

## 2013-07-30 NOTE — Assessment & Plan Note (Signed)
stable overall by history and exam, recent data reviewed with pt, and pt to continue medical treatment as before,  to f/u any worsening symptoms or concerns BP Readings from Last 3 Encounters:  07/30/13 140/70  07/25/13 183/80  07/01/13 182/84

## 2013-07-30 NOTE — Progress Notes (Signed)
Subjective:    Patient ID: Troy Palmer, male    DOB: 07-10-1946, 67 y.o.   MRN: [id number]  HPI   Here with 2-3 wks acute onset fever, facial pain, pressure, headache, general weakness and malaise, and greenish d/c, with mild ST and cough, but pt denies chest pain, wheezing, increased sob or doe, orthopnea, PND, increased LE swelling, palpitations, dizziness or syncope. Previous tx not effective.  Pt denies new neurological symptoms such as new headache, or facial or extremity weakness or numbness   Pt denies polydipsia, polyuria, Past Medical History  Diagnosis Date  . TB SKIN TEST, POSITIVE 03/28/2007  . SLEEP APNEA, OBSTRUCTIVE, MODERATE 04/09/2007  . RENAL INSUFFICIENCY 03/15/2010  . PVD WITH CLAUDICATION 02/10/2010  . Polymyalgia rheumatica 07/26/2008  . POLYARTHRALGIA 07/13/2008  . Other dysphagia 11/21/2009  . Osteoarth NOS-Unspec 03/28/2007  . LUNG NODULE 12/04/2007  . LOW BACK PAIN 04/09/2007  . LEG PAIN, BILATERAL 01/25/2010  . HYPERTENSION 03/28/2007  . HYPERLIPIDEMIA 12/04/2007  . HEMORRHOIDS, RECURRENT 02/11/2008  . GERD 12/04/2007  . ESOPHAGEAL STRICTURE 05/26/2008  . ERECTILE DYSFUNCTION 04/09/2007  . DYSPHAGIA UNSPECIFIED 03/16/2008  . DIVERTICULOSIS, COLON 12/04/2007  . DEPRESSION 12/04/2007  . Degeneration of cervical intervertebral disc 03/28/2007  . COLONIC POLYPS, HX OF 12/04/2007  . CONSTIPATION 09/25/2010  . CEREBROVASCULAR ACCIDENT, HX OF 07/17/2010  . BENIGN PROSTATIC HYPERTROPHY 04/09/2007  . BACK PAIN 09/16/2009  . ASTHMA 12/04/2007  . ALLERGIC RHINITIS 04/09/2007   Past Surgical History  Procedure Laterality Date  . Total knee arthroplasty      x 2  . Rotator cuff repair      Left    reports that he has quit smoking. He has never used smokeless tobacco. He reports that he does not drink alcohol or use illicit drugs. family history includes Asthma in his other; Heart disease in his father; Hypertension in his brother; Lupus in his brother. Allergies  Allergen  Reactions  . Ace Inhibitors Swelling    Angioedema throat  . Sulfa Antibiotics Hives    And facial angioedema   Current Outpatient Prescriptions on File Prior to Visit  Medication Sig Dispense Refill  . atorvastatin (LIPITOR) 40 MG tablet Take 40 mg by mouth daily.      . fluticasone (FLONASE) 50 MCG/ACT nasal spray Place 2 sprays into the nose daily as needed for allergies.       . furosemide (LASIX) 40 MG tablet Take 40 mg by mouth daily.      Marland Kitchen guaiFENesin (MUCINEX) 600 MG 12 hr tablet Take 1 tablet (600 mg total) by mouth 2 (two) times daily as needed.  10 tablet  0  . ibuprofen (ADVIL,MOTRIN) 800 MG tablet Take 1 tablet (800 mg total) by mouth 3 (three) times daily.  21 tablet  0  . naphazoline-pheniramine (NAPHCON-A) 0.025-0.3 % ophthalmic solution Place 1 drop into both eyes 4 (four) times daily as needed for irritation.      . nebivolol (BYSTOLIC) 10 MG tablet Take 10 mg by mouth daily.      . Tamsulosin HCl (FLOMAX) 0.4 MG CAPS Take 0.4 mg by mouth daily.        No current facility-administered medications on file prior to visit.   Review of Systems  Constitutional: Negative for unexpected weight change, or unusual diaphoresis  HENT: Negative for tinnitus.   Eyes: Negative for photophobia and visual disturbance.  Respiratory: Negative for choking and stridor.   Gastrointestinal: Negative for vomiting and blood in stool.  Genitourinary:  Negative for hematuria and decreased urine volume.  Musculoskeletal: Negative for acute joint swelling Skin: Negative for color change and wound.  Neurological: Negative for tremors and numbness other than noted  Psychiatric/Behavioral: Negative for decreased concentration or  hyperactivity.       Objective:   Physical Exam BP 140/70  Pulse 70  Temp(Src) 97.3 F (36.3 C) (Oral)  Wt 296 lb (134.265 kg)  SpO2 94% VS noted, mild ill Constitutional: Pt appears well-developed and well-nourished.  HENT: Head: NCAT.  Right Ear: External ear  normal.  Left Ear: External ear normal.  Bilat tm's with mild erythema.  Max sinus areas mild tender.  Pharynx with mild erythema, no exudate Eyes: Conjunctivae and EOM are normal. Pupils are equal, round, and reactive to light.  Neck: Normal range of motion. Neck supple.  Cardiovascular: Normal rate and regular rhythm.   Pulmonary/Chest: Effort normal and breath sounds normal.  Neurological: Pt is alert. Not confused  Skin: Skin is warm. No erythema.  Psychiatric: Pt behavior is normal. Thought content normal.     Assessment & Plan:

## 2013-07-30 NOTE — Progress Notes (Signed)
Pre-visit discussion using our clinic review tool. No additional management support is needed unless otherwise documented below in the visit note.  

## 2013-07-30 NOTE — Assessment & Plan Note (Signed)
stable overall by history and exam, recent data reviewed with pt, and pt to continue medical treatment as before,  to f/u any worsening symptoms or concerns Lab Results  Component Value Date   HGBA1C 5.8 10/14/2012

## 2013-07-31 ENCOUNTER — Telehealth: Payer: Self-pay

## 2013-07-31 MED ORDER — BENZONATATE 100 MG PO CAPS: ORAL_CAPSULE | ORAL | Status: DC

## 2013-07-31 NOTE — Telephone Encounter (Signed)
Done per emr - for tessalon perle

## 2013-07-31 NOTE — Telephone Encounter (Signed)
The patient stated the medicine he was prescribed is not covered by his insurance (the cough medicine).  He is hoping to get a different med called in.    Pt callback - [phone]

## 2013-07-31 NOTE — Telephone Encounter (Signed)
Patient informed. 

## 2013-09-16 ENCOUNTER — Ambulatory Visit (INDEPENDENT_AMBULATORY_CARE_PROVIDER_SITE_OTHER): Payer: PRIVATE HEALTH INSURANCE | Admitting: Internal Medicine

## 2013-09-16 ENCOUNTER — Encounter: Payer: Self-pay | Admitting: Internal Medicine

## 2013-09-16 VITALS — BP 120/62 | HR 61 | Temp 97.0°F | Ht 70.0 in | Wt 298.2 lb

## 2013-09-16 DIAGNOSIS — M25519 Pain in unspecified shoulder: Secondary | ICD-10-CM

## 2013-09-16 DIAGNOSIS — J45909 Unspecified asthma, uncomplicated: Secondary | ICD-10-CM

## 2013-09-16 DIAGNOSIS — J309 Allergic rhinitis, unspecified: Secondary | ICD-10-CM

## 2013-09-16 DIAGNOSIS — M25511 Pain in right shoulder: Secondary | ICD-10-CM

## 2013-09-16 DIAGNOSIS — M545 Low back pain, unspecified: Secondary | ICD-10-CM

## 2013-09-16 DIAGNOSIS — M25512 Pain in left shoulder: Secondary | ICD-10-CM | POA: Insufficient documentation

## 2013-09-16 DIAGNOSIS — Z0000 Encounter for general adult medical examination without abnormal findings: Secondary | ICD-10-CM

## 2013-09-16 MED ORDER — METHYLPREDNISOLONE ACETATE 80 MG/ML IJ SUSP
80.0000 mg | Freq: Once | INTRAMUSCULAR | Status: AC
Start: 2013-09-16 — End: 2013-09-16
  Administered 2013-09-16: 80 mg via INTRAMUSCULAR

## 2013-09-16 MED ORDER — CETIRIZINE HCL 10 MG PO TABS: 10.0000 mg | ORAL_TABLET | Freq: Every day | ORAL | Status: DC

## 2013-09-16 NOTE — Patient Instructions (Signed)
You had the steroid shot today Please take all new medication as prescribed - the zyrtec Please continue all other medications as before Please have the pharmacy call with any other refills you may need.  You will be contacted regarding the referral for: Dr Smith/sports medicine (in this office) for the shoulder and back pain)  Please return in 1 months, or sooner if needed, with Lab testing done 3-5 days before

## 2013-09-16 NOTE — Assessment & Plan Note (Signed)
With bilat impingement like symtpoms - ok for refer to Dr Smith/sports med

## 2013-09-16 NOTE — Assessment & Plan Note (Signed)
Overall stable except for increased/ongoing pain last few mo, also for sport med eval/refer

## 2013-09-16 NOTE — Assessment & Plan Note (Signed)
Mild to mod, for depomedrol IM, and zyrtec prn,  to f/u any worsening symptoms or concerns, consider f/u with allergy clinic or different nasal steroid

## 2013-09-16 NOTE — Assessment & Plan Note (Signed)
stable overall by history and exam, recent data reviewed with pt, and pt to continue medical treatment as before,  to f/u any worsening symptoms or concerns SpO2 Readings from Last 3 Encounters:  09/16/13 94%  07/30/13 94%  07/25/13 96%

## 2013-09-16 NOTE — Progress Notes (Signed)
Subjective:    Patient ID: Troy Palmer, male    DOB: [birth year], 68 y.o.   MRN: [id number]  HPI  Here to f/u - Does have several wks ongoing nasal allergy symptoms with clearish congestion, itch and sneezing, without fever, pain, ST, cough, swelling or wheezing, overall gradually worsening.  Is now off flonase as taking it seemed to cause elev BP to 200 and had to be seen in the ER, does not want further nasal steroid, or prednisone for that matter as he has gained wt in the past.  No fever, nosebleeds, ST, cough. Pt denies chest pain, increased sob or doe, wheezing, orthopnea, PND, increased LE swelling, palpitations, dizziness or syncope.  Pt denies polydipsia, polyuria,  Pt continues to have recurring LBP without change in severity, bowel or bladder change, fever, wt loss,  worsening LE pain/numbness/weakness, gait change or falls, but pain ongoing. Also with bilat shoulder pain, worse to abduct bilat right > left, worse in last few months. Past Medical History  Diagnosis Date  . TB SKIN TEST, POSITIVE 03/28/2007  . SLEEP APNEA, OBSTRUCTIVE, MODERATE 04/09/2007  . RENAL INSUFFICIENCY 03/15/2010  . PVD WITH CLAUDICATION 02/10/2010  . Polymyalgia rheumatica 07/26/2008  . POLYARTHRALGIA 07/13/2008  . Other dysphagia 11/21/2009  . Osteoarth NOS-Unspec 03/28/2007  . LUNG NODULE 12/04/2007  . LOW BACK PAIN 04/09/2007  . LEG PAIN, BILATERAL 01/25/2010  . HYPERTENSION 03/28/2007  . HYPERLIPIDEMIA 12/04/2007  . HEMORRHOIDS, RECURRENT 02/11/2008  . GERD 12/04/2007  . ESOPHAGEAL STRICTURE 05/26/2008  . ERECTILE DYSFUNCTION 04/09/2007  . DYSPHAGIA UNSPECIFIED 03/16/2008  . DIVERTICULOSIS, COLON 12/04/2007  . DEPRESSION 12/04/2007  . Degeneration of cervical intervertebral disc 03/28/2007  . COLONIC POLYPS, HX OF 12/04/2007  . CONSTIPATION 09/25/2010  . CEREBROVASCULAR ACCIDENT, HX OF 07/17/2010  . BENIGN PROSTATIC HYPERTROPHY 04/09/2007  . BACK PAIN 09/16/2009  . ASTHMA 12/04/2007  . ALLERGIC RHINITIS 04/09/2007     Past Surgical History  Procedure Laterality Date  . Total knee arthroplasty      x 2  . Rotator cuff repair      Left    reports that he has quit smoking. He has never used smokeless tobacco. He reports that he does not drink alcohol or use illicit drugs. family history includes Asthma in his other; Heart disease in his father; Hypertension in his brother; Lupus in his brother. Allergies  Allergen Reactions  . Ace Inhibitors Swelling    Angioedema throat  . Sulfa Antibiotics Hives    And facial angioedema   Current Outpatient Prescriptions on File Prior to Visit  Medication Sig Dispense Refill  . atorvastatin (LIPITOR) 40 MG tablet Take 40 mg by mouth daily.      . benzonatate (TESSALON) 100 MG capsule 1-2 tab by mouth every 6 hrs as needed for cough  60 capsule  1  . fluticasone (FLONASE) 50 MCG/ACT nasal spray Place 2 sprays into the nose daily as needed for allergies.       . furosemide (LASIX) 40 MG tablet Take 40 mg by mouth daily.      Marland Kitchen guaiFENesin (MUCINEX) 600 MG 12 hr tablet Take 1 tablet (600 mg total) by mouth 2 (two) times daily as needed.  10 tablet  0  . ibuprofen (ADVIL,MOTRIN) 800 MG tablet Take 1 tablet (800 mg total) by mouth 3 (three) times daily.  21 tablet  0  . naphazoline-pheniramine (NAPHCON-A) 0.025-0.3 % ophthalmic solution Place 1 drop into both eyes 4 (four) times daily as needed for  irritation.      . nebivolol (BYSTOLIC) 10 MG tablet Take 10 mg by mouth daily.      . Tamsulosin HCl (FLOMAX) 0.4 MG CAPS Take 0.4 mg by mouth daily.        No current facility-administered medications on file prior to visit.   Review of Systems  Constitutional: Negative for unexpected weight change, or unusual diaphoresis  HENT: Negative for tinnitus.   Eyes: Negative for photophobia and visual disturbance.  Respiratory: Negative for choking and stridor.   Gastrointestinal: Negative for vomiting and blood in stool.  Genitourinary: Negative for hematuria and decreased  urine volume.  Musculoskeletal: Negative for acute joint swelling Skin: Negative for color change and wound.  Neurological: Negative for tremors and numbness other than noted  Psychiatric/Behavioral: Negative for decreased concentration or  hyperactivity.       Objective:   Physical Exam BP 120/62  Pulse 61  Temp(Src) 97 F (36.1 C) (Oral)  Ht 5\' 10"  (1.778 m)  Wt 298 lb 4 oz (135.285 kg)  BMI 42.79 kg/m2  SpO2 94% VS noted, not ill apeparing Constitutional: Pt appears well-developed and well-nourished.  HENT: Head: NCAT.  Right Ear: External ear normal.  Left Ear: External ear normal.  Bilat tm's with mild erythema.  Max sinus areas non tender.  Pharynx with mild erythema, no exudate Eyes: Conjunctivae with bilat conjunct watery d/c, and EOM are normal. Pupils are equal, round, and reactive to light.  Neck: Normal range of motion. Neck supple.  Cardiovascular: Normal rate and regular rhythm.   Pulmonary/Chest: Effort normal and breath sounds normal.  - no rales or wheezing Neurological: Pt is alert. Not confused  Skin: Skin is warm. No erythema.  Psychiatric: Pt behavior is normal. Thought content normal.     Assessment & Plan:

## 2013-09-16 NOTE — Progress Notes (Signed)
Pre-visit discussion using our clinic review tool. No additional management support is needed unless otherwise documented below in the visit note.  

## 2013-09-23 ENCOUNTER — Telehealth: Payer: Self-pay

## 2013-09-23 ENCOUNTER — Other Ambulatory Visit: Payer: Self-pay | Admitting: Internal Medicine

## 2013-09-23 ENCOUNTER — Encounter: Payer: Self-pay | Admitting: Family Medicine

## 2013-09-23 ENCOUNTER — Ambulatory Visit (INDEPENDENT_AMBULATORY_CARE_PROVIDER_SITE_OTHER): Payer: PRIVATE HEALTH INSURANCE | Admitting: Family Medicine

## 2013-09-23 VITALS — BP 138/72 | HR 66 | Temp 97.1°F | Resp 18 | Wt 296.0 lb

## 2013-09-23 DIAGNOSIS — M25519 Pain in unspecified shoulder: Secondary | ICD-10-CM

## 2013-09-23 DIAGNOSIS — M25512 Pain in left shoulder: Principal | ICD-10-CM

## 2013-09-23 DIAGNOSIS — M25511 Pain in right shoulder: Secondary | ICD-10-CM

## 2013-09-23 MED ORDER — PREDNISONE 10 MG PO TABS: ORAL_TABLET | ORAL | Status: DC

## 2013-09-23 NOTE — Assessment & Plan Note (Signed)
Patient still has good range of motion as well as strength of his shoulders. Discuss potentially do an ultrasound to evaluate patient's rotator cuff patient would like to weeks. I do think patient is going to have some osteoarthritis as well as possibly some inflammation secondary to his polymyalgia rheumatica. Discussed different medicines and he did not want to add any other prescription medicines. Patient will come back again in 3 weeks' time. Patient continues to have pain at like to do an ultrasound-guided objectionable shoulders. We'll also do an evaluation of the rotator cuff under ultrasound revealed a consider x-rays of the shoulder as well.

## 2013-09-23 NOTE — Progress Notes (Signed)
Pre-visit discussion using our clinic review tool. No additional management support is needed unless otherwise documented below in the visit note.  

## 2013-09-23 NOTE — Patient Instructions (Signed)
Good to meet you Ice 20 minutes 2 times a day  Take tylenol 650 mg three times a day is the best evidence based medicine we have for arthritis.  Glucosamine sulfate 750mg  twice a day is a supplement that has been shown to help moderate to severe arthritis. Vitamin D 2000 IU daily Fish oil 2 grams daily.  Tumeric 500mg  twice daily.  Capsaicin topically up to four times a day may also help with pain. Cortisone injections are an option if these interventions do not seem to make a difference or need more relief.  If cortisone injections do not help, there are different types of shots that may help but they take longer to take effect.  We can discuss this at follow up.  It's important that you continue to stay active. Controlling your weight is important.  Consider physical therapy to strengthen muscles around the joint that hurts to take pressure off of the joint itself. Shoe inserts with good arch support may be helpful.  [employer] orth[facility] at [employer] [facility] could help.  Water aerobics and cycling with low resistance are the best two types of exercise for arthritis. Come back and see me in 3 weeks. If still in pain will do injection.

## 2013-09-23 NOTE — Telephone Encounter (Signed)
Ok this time, but if cont' to be a problem, you may need to see allergist

## 2013-09-23 NOTE — Telephone Encounter (Signed)
The patient still has a runny nose from his allergies.  He was seen last week and did received depo 80, but would now like a script for prednisone to help dry up nasal drainage.

## 2013-09-23 NOTE — Telephone Encounter (Signed)
Patient informed. 

## 2013-09-23 NOTE — Progress Notes (Signed)
  [facility] [place], [place] [address] Phone: [phone] Subjective:    I'm seeing this patient by the request  of:  Cathlean Cower, MD   CC: Bilateral shoulder pain  [id number] Troy Palmer is a 68 y.o. male coming in with complaint of bilateral shoulder pain. Patient has had this pain for multiple years. Patient does have a past medical history significant for polymyalgia rheumatica. Patient states that his shoulders is continuing weeks from time to time. Patient states that he just has a generalized soreness to the shoulders that seems to be worse with overhead activity or repetitive motion. Patient denies any radiation down the arm or any numbness or weakness in the hands or wrists. Patient states that he does sleep fairly comfortably. Patient has tried other medications with minimal improvement. Patient was the severity of 6/10.     Past medical history, social, surgical and family history all reviewed in electronic medical record.   Review of Systems: No headache, visual changes, nausea, vomiting, diarrhea, constipation, dizziness, abdominal pain, skin rash, fevers, chills, night sweats, weight loss, swollen lymph nodes, body aches, joint swelling, muscle aches, chest pain, shortness of breath, mood changes.   Objective There were no vitals taken for this visit.  General: No apparent distress alert and oriented x3 mood and affect normal, dressed appropriately.  HEENT: Pupils equal, extraocular movements intact  Respiratory: Patient's speak in full sentences and does not appear short of breath  Cardiovascular: No lower extremity edema, non tender, no erythema  Skin: Warm dry intact with no signs of infection or rash on extremities or on axial skeleton.  Abdomen: Soft nontender  Neuro: Cranial nerves II through XII are intact, neurovascularly intact in all extremities with 2+ DTRs and 2+ pulses.  Lymph: No lymphadenopathy of posterior  or anterior cervical chain or axillae bilaterally.  Gait normal with good balance and coordination.  MSK:  Non tender with full range of motion and good stability and symmetric strength and tone of elbows, wrist, hip, knee and ankles bilaterally.  Shoulder: bilateraly Inspection reveals no abnormalities, atrophy or asymmetry. Palpation is normal with no tenderness over AC joint or bicipital groove. ROM is full in all planes. Patient does have some mild pain with the last 10 of internal rotation, forward flexion as well as abduction. Rotator cuff strength normal throughout at 4/5 but symmetric  signs of impingement with positive Neer and Hawkin's tests, negative empty can sign. Speeds and Yergason's tests normal. No labral pathology noted with negative Obrien's, negative clunk and good stability. Normal scapular function observed. No painful arc and no drop arm sign. No apprehension sign       Impression and Recommendations:     This case required medical decision making of moderate complexity.

## 2013-10-15 ENCOUNTER — Ambulatory Visit (INDEPENDENT_AMBULATORY_CARE_PROVIDER_SITE_OTHER): Payer: PRIVATE HEALTH INSURANCE | Admitting: Internal Medicine

## 2013-10-15 ENCOUNTER — Other Ambulatory Visit (INDEPENDENT_AMBULATORY_CARE_PROVIDER_SITE_OTHER): Payer: PRIVATE HEALTH INSURANCE

## 2013-10-15 ENCOUNTER — Encounter: Payer: Self-pay | Admitting: Internal Medicine

## 2013-10-15 VITALS — BP 132/82 | HR 58 | Temp 99.0°F | Ht 70.0 in | Wt 297.2 lb

## 2013-10-15 DIAGNOSIS — M353 Polymyalgia rheumatica: Secondary | ICD-10-CM

## 2013-10-15 DIAGNOSIS — R7309 Other abnormal glucose: Secondary | ICD-10-CM

## 2013-10-15 DIAGNOSIS — R7302 Impaired glucose tolerance (oral): Secondary | ICD-10-CM

## 2013-10-15 DIAGNOSIS — M109 Gout, unspecified: Secondary | ICD-10-CM

## 2013-10-15 DIAGNOSIS — E785 Hyperlipidemia, unspecified: Secondary | ICD-10-CM

## 2013-10-15 DIAGNOSIS — N320 Bladder-neck obstruction: Secondary | ICD-10-CM

## 2013-10-15 DIAGNOSIS — R079 Chest pain, unspecified: Secondary | ICD-10-CM

## 2013-10-15 DIAGNOSIS — I10 Essential (primary) hypertension: Secondary | ICD-10-CM

## 2013-10-15 DIAGNOSIS — Z23 Encounter for immunization: Secondary | ICD-10-CM

## 2013-10-15 LAB — CBC WITH DIFFERENTIAL/PLATELET
Basophils Absolute: 0 10*3/uL (ref 0.0–0.1)
Basophils Relative: 0.5 % (ref 0.0–3.0)
Eosinophils Absolute: 0.2 10*3/uL (ref 0.0–0.7)
Eosinophils Relative: 3.5 % (ref 0.0–5.0)
HEMATOCRIT: 41.8 % (ref 39.0–52.0)
Hemoglobin: 13.6 g/dL (ref 13.0–17.0)
LYMPHS ABS: 1.8 10*3/uL (ref 0.7–4.0)
LYMPHS PCT: 36.7 % (ref 12.0–46.0)
MCHC: 32.4 g/dL (ref 30.0–36.0)
MCV: 85 fl (ref 78.0–100.0)
MONOS PCT: 14.1 % — AB (ref 3.0–12.0)
Monocytes Absolute: 0.7 10*3/uL (ref 0.1–1.0)
Neutro Abs: 2.2 10*3/uL (ref 1.4–7.7)
Neutrophils Relative %: 45.2 % (ref 43.0–77.0)
PLATELETS: 244 10*3/uL (ref 150.0–400.0)
RBC: 4.92 Mil/uL (ref 4.22–5.81)
RDW: 13.4 % (ref 11.5–14.6)
WBC: 4.9 10*3/uL (ref 4.5–10.5)

## 2013-10-15 LAB — URINALYSIS, ROUTINE W REFLEX MICROSCOPIC
Bilirubin Urine: NEGATIVE
Hgb urine dipstick: NEGATIVE
Ketones, ur: NEGATIVE
LEUKOCYTES UA: NEGATIVE
Nitrite: NEGATIVE
PH: 5.5 (ref 5.0–8.0)
RBC / HPF: NONE SEEN (ref 0–?)
SPECIFIC GRAVITY, URINE: 1.01 (ref 1.000–1.030)
Total Protein, Urine: NEGATIVE
Urine Glucose: NEGATIVE
Urobilinogen, UA: 0.2 (ref 0.0–1.0)
WBC UA: NONE SEEN (ref 0–?)

## 2013-10-15 LAB — HEPATIC FUNCTION PANEL
ALT: 22 U/L (ref 0–53)
AST: 22 U/L (ref 0–37)
Albumin: 3.2 g/dL — ABNORMAL LOW (ref 3.5–5.2)
Alkaline Phosphatase: 107 U/L (ref 39–117)
Bilirubin, Direct: 0.1 mg/dL (ref 0.0–0.3)
Total Bilirubin: 0.8 mg/dL (ref 0.3–1.2)
Total Protein: 6.5 g/dL (ref 6.0–8.3)

## 2013-10-15 LAB — BASIC METABOLIC PANEL
BUN: 19 mg/dL (ref 6–23)
CALCIUM: 9.1 mg/dL (ref 8.4–10.5)
CO2: 28 mEq/L (ref 19–32)
Chloride: 105 mEq/L (ref 96–112)
Creatinine, Ser: 1.7 mg/dL — ABNORMAL HIGH (ref 0.4–1.5)
GFR: 52.99 mL/min — AB (ref >60.00)
GLUCOSE: 78 mg/dL (ref 70–99)
Potassium: 4.1 mEq/L (ref 3.5–5.1)
Sodium: 138 mEq/L (ref 135–145)

## 2013-10-15 LAB — HEMOGLOBIN A1C: HEMOGLOBIN A1C: 5.7 % (ref 4.6–6.5)

## 2013-10-15 LAB — LIPID PANEL
Cholesterol: 179 mg/dL (ref 0–200)
HDL: 54.8 mg/dL (ref >39.00)
LDL Cholesterol: 109 mg/dL — ABNORMAL HIGH (ref 0–99)
TRIGLYCERIDES: 75 mg/dL (ref 0.0–149.0)
Total CHOL/HDL Ratio: 3
VLDL: 15 mg/dL (ref 0.0–40.0)

## 2013-10-15 LAB — URIC ACID: Uric Acid, Serum: 5.3 mg/dL (ref 4.0–7.8)

## 2013-10-15 LAB — TSH: TSH: 1.43 u[IU]/mL (ref 0.35–5.50)

## 2013-10-15 LAB — PSA: PSA: 1.49 ng/mL (ref 0.10–4.00)

## 2013-10-15 LAB — SEDIMENTATION RATE: Sed Rate: 31 mm/hr — ABNORMAL HIGH (ref 0–22)

## 2013-10-15 MED ORDER — COLCHICINE 0.6 MG PO TABS: ORAL_TABLET | ORAL | Status: DC

## 2013-10-15 MED ORDER — ALLOPURINOL 100 MG PO TABS: 100.0000 mg | ORAL_TABLET | Freq: Every day | ORAL | Status: DC

## 2013-10-15 MED ORDER — METHYLPREDNISOLONE ACETATE 80 MG/ML IJ SUSP
80.0000 mg | Freq: Once | INTRAMUSCULAR | Status: AC
Start: 2013-10-15 — End: 2013-10-15
  Administered 2013-10-15: 80 mg via INTRAMUSCULAR

## 2013-10-15 NOTE — Assessment & Plan Note (Signed)
BP Readings from Last 3 Encounters:  10/15/13 132/82  09/23/13 138/72  09/16/13 120/62   stable overall by history and exam, recent data reviewed with pt, and pt to continue medical treatment as before,  to f/u any worsening symptoms or concerns

## 2013-10-15 NOTE — Progress Notes (Signed)
Subjective:    Patient ID: Troy Palmer, male    DOB: 1946-07-06, 68 y.o.   MRN: [id number]  HPI  Here for yearly f/u;  Overall doing ok;  Pt denies worsening SOB, DOE, wheezing, orthopnea, PND, worsening LE edema, palpitations, dizziness or syncope, but has several wks of nonexertional left CP, dull without radiation, diaphoresis, n/v, palps.   Pt denies neurological change such as new headache, facial or extremity weakness.  Pt denies polydipsia, polyuria, or low sugar symptoms. Pt states overall good compliance with treatment and medications, good tolerability, and has been trying to follow lower cholesterol diet.  Pt denies worsening depressive symptoms, suicidal ideation or panic. No fever, night sweats, wt loss, loss of appetite, or other constitutional symptoms.  Pt states good ability with ADL's, has low fall risk, home safety reviewed and adequate, no other significant changes in hearing or vision, and only occasionally active with exercise.  Today incidentally also with 1 wk mild to mod bilat feet first MTP pain/red/swelling similar to that successfully tx as gout about mid 2014. No prior or since until now.  Pt requests ongoing tx for prevention as well. Due for prevnar today  Bilat shoulder pain improved after seeing Dr Tamala Julian Past Medical History  Diagnosis Date  . TB SKIN TEST, POSITIVE 03/28/2007  . SLEEP APNEA, OBSTRUCTIVE, MODERATE 04/09/2007  . RENAL INSUFFICIENCY 03/15/2010  . PVD WITH CLAUDICATION 02/10/2010  . Polymyalgia rheumatica 07/26/2008  . POLYARTHRALGIA 07/13/2008  . Other dysphagia 11/21/2009  . Osteoarth NOS-Unspec 03/28/2007  . LUNG NODULE 12/04/2007  . LOW BACK PAIN 04/09/2007  . LEG PAIN, BILATERAL 01/25/2010  . HYPERTENSION 03/28/2007  . HYPERLIPIDEMIA 12/04/2007  . HEMORRHOIDS, RECURRENT 02/11/2008  . GERD 12/04/2007  . ESOPHAGEAL STRICTURE 05/26/2008  . ERECTILE DYSFUNCTION 04/09/2007  . DYSPHAGIA UNSPECIFIED 03/16/2008  . DIVERTICULOSIS, COLON 12/04/2007  . DEPRESSION  12/04/2007  . Degeneration of cervical intervertebral disc 03/28/2007  . COLONIC POLYPS, HX OF 12/04/2007  . CONSTIPATION 09/25/2010  . CEREBROVASCULAR ACCIDENT, HX OF 07/17/2010  . BENIGN PROSTATIC HYPERTROPHY 04/09/2007  . BACK PAIN 09/16/2009  . ASTHMA 12/04/2007  . ALLERGIC RHINITIS 04/09/2007   Past Surgical History  Procedure Laterality Date  . Total knee arthroplasty      x 2  . Rotator cuff repair      Left    reports that he has quit smoking. He has never used smokeless tobacco. He reports that he does not drink alcohol or use illicit drugs. family history includes Asthma in his other; Heart disease in his father; Hypertension in his brother; Lupus in his brother. Allergies  Allergen Reactions  . Ace Inhibitors Swelling    Angioedema throat  . Sulfa Antibiotics Hives    And facial angioedema   Current Outpatient Prescriptions on File Prior to Visit  Medication Sig Dispense Refill  . atorvastatin (LIPITOR) 40 MG tablet Take 40 mg by mouth daily.      . furosemide (LASIX) 40 MG tablet Take 40 mg by mouth daily.      Marland Kitchen ibuprofen (ADVIL,MOTRIN) 800 MG tablet Take 1 tablet (800 mg total) by mouth 3 (three) times daily.  21 tablet  0  . naphazoline-pheniramine (NAPHCON-A) 0.025-0.3 % ophthalmic solution Place 1 drop into both eyes 4 (four) times daily as needed for irritation.      . nebivolol (BYSTOLIC) 10 MG tablet Take 10 mg by mouth daily.      . Tamsulosin HCl (FLOMAX) 0.4 MG CAPS Take 0.4 mg by mouth  daily.        No current facility-administered medications on file prior to visit.    Review of Systems Constitutional: Negative for diaphoresis, activity change, appetite change or unexpected weight change.  HENT: Negative for hearing loss, ear pain, facial swelling, mouth sores and neck stiffness.   Eyes: Negative for pain, redness and visual disturbance.  Respiratory: Negative for shortness of breath and wheezing.   Cardiovascular: Negative for chest pain and palpitations.    Gastrointestinal: Negative for diarrhea, blood in stool, abdominal distention or other pain Genitourinary: Negative for hematuria, flank pain or change in urine volume.  Musculoskeletal: Negative for myalgias and joint swelling.  Skin: Negative for color change and wound.  Neurological: Negative for syncope and numbness. other than noted Hematological: Negative for adenopathy.  Psychiatric/Behavioral: Negative for hallucinations, self-injury, decreased concentration and agitation.      Objective:   Physical Exam BP 132/82  Pulse 58  Temp(Src) 99 F (37.2 C) (Oral)  Ht 5\' 10"  (1.778 m)  Wt 297 lb 4 oz (134.832 kg)  BMI 42.65 kg/m2  SpO2 94% VS noted,  Constitutional: Pt is oriented to person, place, and time. Appears well-developed and well-nourished. Annabell Sabal Head: Normocephalic and atraumatic.  Right Ear: External ear normal.  Left Ear: External ear normal.  Nose: Nose normal.  Mouth/Throat: Oropharynx is clear and moist.  Eyes: Conjunctivae and EOM are normal. Pupils are equal, round, and reactive to light.  Neck: Normal range of motion. Neck supple. No JVD present. No tracheal deviation present.  Cardiovascular: Normal rate, regular rhythm, normal heart sounds and intact distal pulses.   Pulmonary/Chest: Effort normal and breath sounds normal.  Abdominal: Soft. Bowel sounds are normal. There is no tenderness. No HSM  Musculoskeletal: Normal range of motion. Exhibits no edema.  Lymphadenopathy:  Has no cervical adenopathy.  Neurological: Pt is alert and oriented to person, place, and time. Pt has normal reflexes. No cranial nerve deficit.  Skin: Skin is warm and dry. No rash noted.  Bilat first MTP 1+ red/tender/swelling Psychiatric:  Has mild dysphroic mood and affect. Behavior is normal.     Assessment & Plan:

## 2013-10-15 NOTE — Progress Notes (Signed)
Pre visit review using our clinic review tool, if applicable. No additional management support is needed unless otherwise documented below in the visit note. 

## 2013-10-15 NOTE — Assessment & Plan Note (Signed)
Also for esr today, but recent tx improved shoulder pain

## 2013-10-15 NOTE — Assessment & Plan Note (Signed)
stable overall by history and exam, recent data reviewed with pt, and pt to continue medical treatment as before,  to f/u any worsening symptoms or concerns Lab Results  Component Value Date   LDLCALC 111* 10/14/2012

## 2013-10-15 NOTE — Patient Instructions (Signed)
You had the new Prevnar pneumonia shot, and the steroid shot today  Your EKG was OK today  Please take all new medication as prescribed - the colchicine as needed as long the pain needs to get better, as well as the allopurinol 100 mg per day  Please continue all other medications as before, and refills have been done if requested. Please have the pharmacy call with any other refills you may need.  Please continue your efforts at being more active, low cholesterol diet, and weight control. You are otherwise up to date with prevention measures today.  Please go to the LAB in the Basement (turn left off the elevator) for the tests to be done today You will be contacted by phone if any changes need to be made immediately.  Otherwise, you will receive a letter about your results with an explanation, but please check with MyChart first.  You will be contacted regarding the referral for: stress test  Please remember to sign up for MyChart if you have not done so, as this will be important to you in the future with finding out test results, communicating by private email, and scheduling acute appointments online when needed.  Please return in 6 months, or sooner if needed

## 2013-10-15 NOTE — Assessment & Plan Note (Signed)
Asympto, for a1c today,  to f/u any worsening symptoms or concerns, cont diet, wt loss efforts

## 2013-10-15 NOTE — Addendum Note (Signed)
Addended by: Sharon Seller B on: 10/15/2013 11:49 AM   Modules accepted: Orders

## 2013-10-15 NOTE — Assessment & Plan Note (Addendum)
ECG reviewed as per emr, atypical pain, with mult CRF's - for stress test  Note:  Total time for pt hx, exam, review of record with pt in the room, determination of diagnoses and plan for further eval and tx is > 40 min, with over 50% spent in coordination and counseling of patient

## 2013-10-15 NOTE — Assessment & Plan Note (Signed)
Mild to mod, for depomedrol IM, colchciine bid prn, and allopurinol, check uric acid,  to f/u any worsening symptoms or concerns

## 2013-10-16 ENCOUNTER — Other Ambulatory Visit: Payer: Self-pay | Admitting: Internal Medicine

## 2013-10-22 ENCOUNTER — Ambulatory Visit: Payer: PRIVATE HEALTH INSURANCE | Admitting: Family Medicine

## 2013-11-02 ENCOUNTER — Encounter (HOSPITAL_COMMUNITY): Payer: PRIVATE HEALTH INSURANCE

## 2013-11-02 ENCOUNTER — Encounter: Payer: Self-pay | Admitting: Cardiology

## 2013-11-09 ENCOUNTER — Other Ambulatory Visit: Payer: Self-pay | Admitting: Internal Medicine

## 2013-11-09 NOTE — Telephone Encounter (Signed)
Refill done.  

## 2013-11-25 ENCOUNTER — Encounter (HOSPITAL_COMMUNITY): Payer: PRIVATE HEALTH INSURANCE

## 2013-12-22 ENCOUNTER — Ambulatory Visit (INDEPENDENT_AMBULATORY_CARE_PROVIDER_SITE_OTHER): Payer: PRIVATE HEALTH INSURANCE | Admitting: Internal Medicine

## 2013-12-22 ENCOUNTER — Encounter: Payer: Self-pay | Admitting: Internal Medicine

## 2013-12-22 ENCOUNTER — Other Ambulatory Visit (INDEPENDENT_AMBULATORY_CARE_PROVIDER_SITE_OTHER): Payer: PRIVATE HEALTH INSURANCE

## 2013-12-22 ENCOUNTER — Ambulatory Visit: Payer: PRIVATE HEALTH INSURANCE

## 2013-12-22 VITALS — BP 132/82 | HR 63 | Temp 97.8°F | Ht 70.0 in | Wt 292.1 lb

## 2013-12-22 DIAGNOSIS — R209 Unspecified disturbances of skin sensation: Secondary | ICD-10-CM

## 2013-12-22 DIAGNOSIS — R202 Paresthesia of skin: Secondary | ICD-10-CM

## 2013-12-22 DIAGNOSIS — N452 Orchitis: Secondary | ICD-10-CM | POA: Insufficient documentation

## 2013-12-22 DIAGNOSIS — M542 Cervicalgia: Secondary | ICD-10-CM

## 2013-12-22 DIAGNOSIS — I10 Essential (primary) hypertension: Secondary | ICD-10-CM

## 2013-12-22 DIAGNOSIS — N453 Epididymo-orchitis: Secondary | ICD-10-CM

## 2013-12-22 LAB — VITAMIN B12: Vitamin B-12: 352 pg/mL (ref 211–911)

## 2013-12-22 MED ORDER — TIZANIDINE HCL 4 MG PO TABS: 4.0000 mg | ORAL_TABLET | Freq: Four times a day (QID) | ORAL | Status: DC | PRN

## 2013-12-22 MED ORDER — DOXYCYCLINE HYCLATE 100 MG PO TABS: 100.0000 mg | ORAL_TABLET | Freq: Two times a day (BID) | ORAL | Status: DC

## 2013-12-22 NOTE — Assessment & Plan Note (Signed)
stable overall by history and exam, recent data reviewed with pt, and pt to continue medical treatment as before,  to f/u any worsening symptoms or concerns BP Readings from Last 3 Encounters:  12/22/13 132/82  10/15/13 132/82  09/23/13 138/72

## 2013-12-22 NOTE — Patient Instructions (Signed)
Please take all new medication as prescribed - the muscle relaxer as needed, and the antibiotic  Please also consider taking an OTC B12 vitamin (or B complex multivitamin) at one per day  A Vitamin B12 level will be added to your next lab testing (but not today)  Please continue all other medications as before, and refills have been done if requested. Please have the pharmacy call with any other refills you may need.  Please continue your efforts at being more active, low cholesterol diet, and weight control.  Please go to the XRAY Department in the Basement (go straight as you get off the elevator) for the x-ray testing (neck xrays)  You will be contacted by phone if any changes need to be made immediately.  Otherwise, you will receive a letter about your results with an explanation, but please check with MyChart first.

## 2013-12-22 NOTE — Progress Notes (Signed)
Pre visit review using our clinic review tool, if applicable. No additional management support is needed unless otherwise documented below in the visit note. 

## 2013-12-22 NOTE — Assessment & Plan Note (Signed)
Mild to mod, for antibx course,  to f/u any worsening symptoms or concerns 

## 2013-12-22 NOTE — Progress Notes (Signed)
Subjective:    Patient ID: Troy Palmer, male    DOB: 07-12-1946, 68 y.o.   MRN: [id number]  HPI  Here with several complaints; c/o 3 mo mild to start scrotal pain now mod , sharp, more constant, persistent, radiatates to the rectal area though Denies worsening reflux, abd pain, dysphagia, n/v, bowel change or blood except for low mid suprapubic area pain.  Also with pain to post mid neck, starts at lower neck, radiates to the back, then the frontal head, intermittent, throbs to start with then "just hurts" and feels pain in the eyes, but no photo/phonophobia, n/v, motrin 800 mg may have helped, but uses mostly for DJD pain.  Also has burning to soles of feet, ongoing , intermittent , off and on since last yr, started about the same times as gout last yr. Nohx frank DM, or b12 deficiency or other neuropathy. Denies urinary symptoms such as dysuria, frequency, urgency, flank pain, hematuria or n/v, fever, chills, except for nocturia x 3 days (stable for 5-6 yrs)  Past Medical History  Diagnosis Date  . TB SKIN TEST, POSITIVE 03/28/2007  . SLEEP APNEA, OBSTRUCTIVE, MODERATE 04/09/2007  . RENAL INSUFFICIENCY 03/15/2010  . PVD WITH CLAUDICATION 02/10/2010  . Polymyalgia rheumatica 07/26/2008  . POLYARTHRALGIA 07/13/2008  . Other dysphagia 11/21/2009  . Osteoarth NOS-Unspec 03/28/2007  . LUNG NODULE 12/04/2007  . LOW BACK PAIN 04/09/2007  . LEG PAIN, BILATERAL 01/25/2010  . HYPERTENSION 03/28/2007  . HYPERLIPIDEMIA 12/04/2007  . HEMORRHOIDS, RECURRENT 02/11/2008  . GERD 12/04/2007  . ESOPHAGEAL STRICTURE 05/26/2008  . ERECTILE DYSFUNCTION 04/09/2007  . DYSPHAGIA UNSPECIFIED 03/16/2008  . DIVERTICULOSIS, COLON 12/04/2007  . DEPRESSION 12/04/2007  . Degeneration of cervical intervertebral disc 03/28/2007  . COLONIC POLYPS, HX OF 12/04/2007  . CONSTIPATION 09/25/2010  . CEREBROVASCULAR ACCIDENT, HX OF 07/17/2010  . BENIGN PROSTATIC HYPERTROPHY 04/09/2007  . BACK PAIN 09/16/2009  . ASTHMA 12/04/2007  . ALLERGIC  RHINITIS 04/09/2007   Past Surgical History  Procedure Laterality Date  . Total knee arthroplasty      x 2  . Rotator cuff repair      Left    reports that he has quit smoking. He has never used smokeless tobacco. He reports that he does not drink alcohol or use illicit drugs. family history includes Asthma in his other; Heart disease in his father; Hypertension in his brother; Lupus in his brother. Allergies  Allergen Reactions  . Ace Inhibitors Swelling    Angioedema throat  . Sulfa Antibiotics Hives    And facial angioedema   Current Outpatient Prescriptions on File Prior to Visit  Medication Sig Dispense Refill  . allopurinol (ZYLOPRIM) 100 MG tablet Take 1 tablet (100 mg total) by mouth daily.  90 tablet  3  . atorvastatin (LIPITOR) 40 MG tablet take 1 tablet by mouth once daily  90 tablet  3  . BYSTOLIC 10 MG tablet take 1 tablet by mouth once daily  30 tablet  6  . colchicine 0.6 MG tablet 1 tab by mouth twice per day as needed  60 tablet  5  . furosemide (LASIX) 40 MG tablet Take 40 mg by mouth daily.      Marland Kitchen ibuprofen (ADVIL,MOTRIN) 800 MG tablet Take 1 tablet (800 mg total) by mouth 3 (three) times daily.  21 tablet  0  . naphazoline-pheniramine (NAPHCON-A) 0.025-0.3 % ophthalmic solution Place 1 drop into both eyes 4 (four) times daily as needed for irritation.      Marland Kitchen  nebivolol (BYSTOLIC) 10 MG tablet Take 10 mg by mouth daily.      . Tamsulosin HCl (FLOMAX) 0.4 MG CAPS Take 0.4 mg by mouth daily.        No current facility-administered medications on file prior to visit.   Review of Systems  Constitutional: Negative for unusual diaphoresis or other sweats  HENT: Negative for ringing in ear Eyes: Negative for double vision or worsening visual disturbance.  Respiratory: Negative for choking and stridor.   Gastrointestinal: Negative for vomiting or other signifcant bowel change Genitourinary: Negative for hematuria or decreased urine volume.  Musculoskeletal: Negative  for other MSK pain or swelling Skin: Negative for color change and worsening wound.  Neurological: Negative for tremors and numbness other than noted  Psychiatric/Behavioral: Negative for decreased concentration or agitation other than above       Objective:   Physical Exam BP 132/82  Pulse 63  Temp(Src) 97.8 F (36.6 C) (Oral)  Ht 5\' 10"  (1.778 m)  Wt 292 lb 2 oz (132.507 kg)  BMI 41.92 kg/m2  SpO2 96% VS noted,  Constitutional: Pt appears well-developed, well-nourished.  HENT: Head: NCAT.  Right Ear: External ear normal.  Left Ear: External ear normal.  Eyes: . Pupils are equal, round, and reactive to light. Conjunctivae and EOM are normal Neck: Normal range of motion. Neck supple. NT, no rash Cardiovascular: Normal rate and regular rhythm.   Pulmonary/Chest: Effort normal and breath sounds normal.  Abd:  Soft, NT, ND, + BS GU - normal male, except right testicle 1-2+ swollen/tender Neurological: Pt is alert. Not confused , motor grossly intact, sens intact Skin: Skin is warm. No rash Psychiatric: Pt behavior is normal. No agitation.     Assessment & Plan:

## 2013-12-22 NOTE — Assessment & Plan Note (Signed)
Exam benign, cant r/o b12 deficiency, to start otc b12 supplement, check b12 with next labs

## 2013-12-22 NOTE — Assessment & Plan Note (Signed)
Suspect recurrent pain related to underlying DJD or DDD - for film today, cont motrin prn, also tizandine prn,  to f/u any worsening symptoms or concerns

## 2013-12-23 ENCOUNTER — Ambulatory Visit: Payer: PRIVATE HEALTH INSURANCE | Admitting: Internal Medicine

## 2013-12-28 ENCOUNTER — Telehealth: Payer: Self-pay | Admitting: Internal Medicine

## 2013-12-28 MED ORDER — CIPROFLOXACIN HCL 500 MG PO TABS: 500.0000 mg | ORAL_TABLET | Freq: Two times a day (BID) | ORAL | Status: DC

## 2013-12-28 NOTE — Telephone Encounter (Signed)
Pt called stated that Dr. Jenny Reichmann gave him medication for muscle relaxer and antibiotic on 12/22/13, pt stated it is making his stomach sick and not feeling good.

## 2013-12-28 NOTE — Telephone Encounter (Signed)
Most likely this is related to the doxycycline, as some persons can have some GI upset  Ok to stop the doxy  I added this as an intolerance to his chart (not a true allergy though)  OK to change to cipro course - done per emr  Robin also to ask pt if he is getting better please with what he called "groin pain" last visit

## 2013-12-29 NOTE — Telephone Encounter (Signed)
Patient informed of change and is feeling better.

## 2014-01-05 ENCOUNTER — Telehealth: Payer: Self-pay | Admitting: Internal Medicine

## 2014-01-05 NOTE — Telephone Encounter (Signed)
Patient is calling because he says that he still is having abdominal pain and ear pain. Medications he has been taking is not working for him. Wants to know what he should do or if referral can be made. Please advise.

## 2014-01-06 NOTE — Telephone Encounter (Signed)
Informed the patient of MD's response and the patient had made an appointment tomorrow with Dr. Jenny Reichmann to discuss.

## 2014-01-06 NOTE — Telephone Encounter (Signed)
Not sure what to say since we did not really discuss ear pain or abd pain at last visit.  If having allergy congestion, consider zyrtec OTC and mucinex otc prn, and even prilosec otc for abd pain

## 2014-01-07 ENCOUNTER — Ambulatory Visit: Payer: PRIVATE HEALTH INSURANCE | Admitting: Internal Medicine

## 2014-01-19 ENCOUNTER — Encounter: Payer: Self-pay | Admitting: Internal Medicine

## 2014-01-19 ENCOUNTER — Ambulatory Visit (INDEPENDENT_AMBULATORY_CARE_PROVIDER_SITE_OTHER): Payer: PRIVATE HEALTH INSURANCE | Admitting: Internal Medicine

## 2014-01-19 VITALS — BP 122/72 | HR 72 | Temp 98.2°F | Ht 70.0 in | Wt 293.0 lb

## 2014-01-19 DIAGNOSIS — M545 Low back pain, unspecified: Secondary | ICD-10-CM

## 2014-01-19 DIAGNOSIS — N452 Orchitis: Secondary | ICD-10-CM

## 2014-01-19 DIAGNOSIS — I10 Essential (primary) hypertension: Secondary | ICD-10-CM

## 2014-01-19 DIAGNOSIS — N453 Epididymo-orchitis: Secondary | ICD-10-CM

## 2014-01-19 MED ORDER — TRAMADOL HCL 50 MG PO TABS: 50.0000 mg | ORAL_TABLET | Freq: Three times a day (TID) | ORAL | Status: DC | PRN

## 2014-01-19 MED ORDER — CIPROFLOXACIN HCL 500 MG PO TABS: 500.0000 mg | ORAL_TABLET | Freq: Two times a day (BID) | ORAL | Status: DC

## 2014-01-19 NOTE — Patient Instructions (Signed)
Please take all new medication as prescribed - the antibiotic, and pain medication  Please continue all other medications as before, and refills have been done if requested.  Please have the pharmacy call with any other refills you may need.  Please keep your appointments with your specialists as you may have planned  Please go to the LAB in the Basement (turn left off the elevator) for the tests to be done tomorrow - just the urine testing

## 2014-01-19 NOTE — Assessment & Plan Note (Signed)
Recurrent, for antibx,  to f/u any worsening symptoms or concerns

## 2014-01-19 NOTE — Progress Notes (Signed)
Pre visit review using our clinic review tool, if applicable. No additional management support is needed unless otherwise documented below in the visit note. 

## 2014-01-19 NOTE — Progress Notes (Signed)
Subjective:    Patient ID: Troy Palmer, male    DOB: Nov 19, 1945, 68 y.o.   MRN: [id number]  HPI    Here to fu with recurrence of pain to right testicle with some radiation to the perineal and right lower back.  Some discomfort to the right lower abd as well. Denies urinary symptoms such as dysuria, frequency, urgency, flank pain, hematuria or n/v, fever, chills.   Has hx of orchtis, as well as know right sacroillitis, and lumbar disc disease.  Pt without bowel or bladder change, fever, wt loss,  worsening LE pain/numbness/weakness, gait change or falls.  Nohx of renal stones Past Medical History  Diagnosis Date  . TB SKIN TEST, POSITIVE 03/28/2007  . SLEEP APNEA, OBSTRUCTIVE, MODERATE 04/09/2007  . RENAL INSUFFICIENCY 03/15/2010  . PVD WITH CLAUDICATION 02/10/2010  . Polymyalgia rheumatica 07/26/2008  . POLYARTHRALGIA 07/13/2008  . Other dysphagia 11/21/2009  . Osteoarth NOS-Unspec 03/28/2007  . LUNG NODULE 12/04/2007  . LOW BACK PAIN 04/09/2007  . LEG PAIN, BILATERAL 01/25/2010  . HYPERTENSION 03/28/2007  . HYPERLIPIDEMIA 12/04/2007  . HEMORRHOIDS, RECURRENT 02/11/2008  . GERD 12/04/2007  . ESOPHAGEAL STRICTURE 05/26/2008  . ERECTILE DYSFUNCTION 04/09/2007  . DYSPHAGIA UNSPECIFIED 03/16/2008  . DIVERTICULOSIS, COLON 12/04/2007  . DEPRESSION 12/04/2007  . Degeneration of cervical intervertebral disc 03/28/2007  . COLONIC POLYPS, HX OF 12/04/2007  . CONSTIPATION 09/25/2010  . CEREBROVASCULAR ACCIDENT, HX OF 07/17/2010  . BENIGN PROSTATIC HYPERTROPHY 04/09/2007  . BACK PAIN 09/16/2009  . ASTHMA 12/04/2007  . ALLERGIC RHINITIS 04/09/2007   Past Surgical History  Procedure Laterality Date  . Total knee arthroplasty      x 2  . Rotator cuff repair      Left    reports that he has quit smoking. He has never used smokeless tobacco. He reports that he does not drink alcohol or use illicit drugs. family history includes Asthma in his other; Heart disease in his father; Hypertension in his brother; Lupus  in his brother. Allergies  Allergen Reactions  . Ace Inhibitors Swelling    Angioedema throat  . Doxycycline     Some GI upset  . Sulfa Antibiotics Hives    And facial angioedema   Current Outpatient Prescriptions on File Prior to Visit  Medication Sig Dispense Refill  . allopurinol (ZYLOPRIM) 100 MG tablet Take 1 tablet (100 mg total) by mouth daily.  90 tablet  3  . atorvastatin (LIPITOR) 40 MG tablet take 1 tablet by mouth once daily  90 tablet  3  . BYSTOLIC 10 MG tablet take 1 tablet by mouth once daily  30 tablet  6  . colchicine 0.6 MG tablet 1 tab by mouth twice per day as needed  60 tablet  5  . furosemide (LASIX) 40 MG tablet Take 40 mg by mouth daily.      Marland Kitchen ibuprofen (ADVIL,MOTRIN) 800 MG tablet Take 1 tablet (800 mg total) by mouth 3 (three) times daily.  21 tablet  0  . naphazoline-pheniramine (NAPHCON-A) 0.025-0.3 % ophthalmic solution Place 1 drop into both eyes 4 (four) times daily as needed for irritation.      . nebivolol (BYSTOLIC) 10 MG tablet Take 10 mg by mouth daily.      . Tamsulosin HCl (FLOMAX) 0.4 MG CAPS Take 0.4 mg by mouth daily.       Marland Kitchen tiZANidine (ZANAFLEX) 4 MG tablet Take 1 tablet (4 mg total) by mouth every 6 (six) hours as needed for muscle spasms.  60 tablet  2   No current facility-administered medications on file prior to visit.   Review of Systems  Constitutional: Negative for unusual diaphoresis or other sweats  HENT: Negative for ringing in ear Eyes: Negative for double vision or worsening visual disturbance.  Respiratory: Negative for choking and stridor.   Gastrointestinal: Negative for vomiting or other signifcant bowel change Genitourinary: Negative for hematuria or decreased urine volume.  Musculoskeletal: Negative for other MSK pain or swelling Skin: Negative for color change and worsening wound.  Neurological: Negative for tremors and numbness other than noted  Psychiatric/Behavioral: Negative for decreased concentration or  agitation other than above       Objective:   Physical Exam BP 122/72  Pulse 72  Temp(Src) 98.2 F (36.8 C) (Oral)  Ht 5\' 10"  (1.778 m)  Wt 293 lb (132.904 kg)  BMI 42.04 kg/m2  SpO2 97% VS noted,  Constitutional: Pt appears well-developed, well-nourished.  HENT: Head: NCAT.  Right Ear: External ear normal.  Left Ear: External ear normal.  Eyes: . Pupils are equal, round, and reactive to light. Conjunctivae and EOM are normal Neck: Normal range of motion. Neck supple.  Cardiovascular: Normal rate and regular rhythm.   Pulmonary/Chest: Effort normal and breath sounds normal.  Abd:  Soft, NT, ND, + BS, no flank tender GU: normal male except for right testicular mild sweling/tender Neurological: Pt is alert. Not confused , motor grossly intact Spine nontender but points to right lumbar paravertebral as a place of pain Skin: Skin is warm. No rash Psychiatric: Pt behavior is normal. No agitation.     Assessment & Plan:

## 2014-01-19 NOTE — Assessment & Plan Note (Signed)
stable overall by history and exam, recent data reviewed with pt, and pt to continue medical treatment as before,  to f/u any worsening symptoms or concerns BP Readings from Last 3 Encounters:  01/19/14 122/72  12/22/13 132/82  10/15/13 132/82

## 2014-01-19 NOTE — Assessment & Plan Note (Signed)
Cant r/o renal stone -for UA, but suspect possible underlying lumbar DDD/djd or sacroiliitis pain, for pain controll, consider MRI

## 2014-01-20 ENCOUNTER — Other Ambulatory Visit (INDEPENDENT_AMBULATORY_CARE_PROVIDER_SITE_OTHER): Payer: PRIVATE HEALTH INSURANCE

## 2014-01-20 DIAGNOSIS — N452 Orchitis: Secondary | ICD-10-CM

## 2014-01-20 DIAGNOSIS — M545 Low back pain, unspecified: Secondary | ICD-10-CM

## 2014-01-20 DIAGNOSIS — N453 Epididymo-orchitis: Secondary | ICD-10-CM

## 2014-01-20 LAB — URINALYSIS, ROUTINE W REFLEX MICROSCOPIC
BILIRUBIN URINE: NEGATIVE
HGB URINE DIPSTICK: NEGATIVE
KETONES UR: NEGATIVE
LEUKOCYTES UA: NEGATIVE
Nitrite: NEGATIVE
Specific Gravity, Urine: 1.01 (ref 1.000–1.030)
Total Protein, Urine: 30 — AB
Urine Glucose: NEGATIVE
Urobilinogen, UA: 0.2 (ref 0.0–1.0)
pH: 6 (ref 5.0–8.0)

## 2014-02-07 ENCOUNTER — Other Ambulatory Visit: Payer: Self-pay | Admitting: Internal Medicine

## 2014-03-17 ENCOUNTER — Other Ambulatory Visit: Payer: Self-pay | Admitting: Internal Medicine

## 2014-03-21 ENCOUNTER — Encounter (HOSPITAL_COMMUNITY): Payer: Self-pay | Admitting: Emergency Medicine

## 2014-03-21 ENCOUNTER — Emergency Department (HOSPITAL_COMMUNITY)
Admission: EM | Admit: 2014-03-21 | Discharge: 2014-03-21 | Disposition: A | Discharge status: Home / Self Care | Payer: PRIVATE HEALTH INSURANCE | Attending: Emergency Medicine | Admitting: Emergency Medicine

## 2014-03-21 DIAGNOSIS — Z87448 Personal history of other diseases of urinary system: Secondary | ICD-10-CM | POA: Insufficient documentation

## 2014-03-21 DIAGNOSIS — M7660 Achilles tendinitis, unspecified leg: Secondary | ICD-10-CM

## 2014-03-21 DIAGNOSIS — Z860100 Personal history of colon polyps, unspecified: Secondary | ICD-10-CM | POA: Insufficient documentation

## 2014-03-21 DIAGNOSIS — N400 Enlarged prostate without lower urinary tract symptoms: Secondary | ICD-10-CM | POA: Insufficient documentation

## 2014-03-21 DIAGNOSIS — Z8719 Personal history of other diseases of the digestive system: Secondary | ICD-10-CM | POA: Insufficient documentation

## 2014-03-21 DIAGNOSIS — Z791 Long term (current) use of non-steroidal anti-inflammatories (NSAID): Secondary | ICD-10-CM | POA: Insufficient documentation

## 2014-03-21 DIAGNOSIS — M1990 Unspecified osteoarthritis, unspecified site: Secondary | ICD-10-CM | POA: Insufficient documentation

## 2014-03-21 DIAGNOSIS — Z79899 Other long term (current) drug therapy: Secondary | ICD-10-CM | POA: Diagnosis not present

## 2014-03-21 DIAGNOSIS — J45909 Unspecified asthma, uncomplicated: Secondary | ICD-10-CM | POA: Insufficient documentation

## 2014-03-21 DIAGNOSIS — E785 Hyperlipidemia, unspecified: Secondary | ICD-10-CM | POA: Diagnosis not present

## 2014-03-21 DIAGNOSIS — Z87891 Personal history of nicotine dependence: Secondary | ICD-10-CM | POA: Insufficient documentation

## 2014-03-21 DIAGNOSIS — Z8673 Personal history of transient ischemic attack (TIA), and cerebral infarction without residual deficits: Secondary | ICD-10-CM | POA: Diagnosis not present

## 2014-03-21 DIAGNOSIS — Z8659 Personal history of other mental and behavioral disorders: Secondary | ICD-10-CM | POA: Diagnosis not present

## 2014-03-21 DIAGNOSIS — M79609 Pain in unspecified limb: Secondary | ICD-10-CM | POA: Diagnosis present

## 2014-03-21 DIAGNOSIS — G8929 Other chronic pain: Secondary | ICD-10-CM | POA: Diagnosis not present

## 2014-03-21 DIAGNOSIS — I10 Essential (primary) hypertension: Secondary | ICD-10-CM | POA: Insufficient documentation

## 2014-03-21 MED ORDER — NAPROXEN 375 MG PO TABS
375.0000 mg | ORAL_TABLET | Freq: Two times a day (BID) | ORAL | Status: DC
  Filled 2014-03-21: qty 1

## 2014-03-21 MED ORDER — NAPROXEN 375 MG PO TABS: 375.0000 mg | ORAL_TABLET | Freq: Two times a day (BID) | ORAL | Status: DC

## 2014-03-21 MED ORDER — NAPROXEN 500 MG PO TABS
500.0000 mg | ORAL_TABLET | Freq: Two times a day (BID) | ORAL | Status: DC
  Administered 2014-03-21: 500 mg via ORAL
  Filled 2014-03-21: qty 1

## 2014-03-21 NOTE — ED Provider Notes (Signed)
Medical screening examination/treatment/procedure(s) were performed by non-physician practitioner and as supervising physician I was immediately available for consultation/collaboration.   EKG Interpretation None        Julianne Rice, MD 03/21/14 [phone]

## 2014-03-21 NOTE — ED Notes (Signed)
Pt c/o pain to heel of R foot. Pt also has swelling to R foot. Pt states he normally has swelling intermittently to both ankles. Pt states symptoms started yesterday. Pt reports that he has a hx of gout in his feet. Pt ambulatory to exam room with steady gait.

## 2014-03-21 NOTE — ED Provider Notes (Signed)
CSN: [id number]     Arrival date & time 03/21/14  0000 History   First MD Initiated Contact with Patient 03/21/14 0013     Chief Complaint  Patient presents with  . Foot Pain     (Consider location/radiation/quality/duration/timing/severity/associated sxs/prior Treatment) HPI Comments: Patient states, that after washing his sneakers, and wearing them to the gym.  He developed right heel pain.  He, states it felt like there was something in, his shoe.  He changed his sandals, but has had persistent pain in his acute least tendon area.  Has not taken anything for discomfort.  Patient is a 68 y.o. male presenting with lower extremity pain. The history is provided by the patient.  Foot Pain This is a new problem. The current episode started yesterday. The problem occurs constantly. The problem has been gradually worsening. Associated symptoms include arthralgias. Pertinent negatives include no fever, joint swelling, numbness, rash or weakness. The symptoms are aggravated by walking. He has tried nothing for the symptoms. The treatment provided no relief.    Past Medical History  Diagnosis Date  . TB SKIN TEST, POSITIVE 03/28/2007  . SLEEP APNEA, OBSTRUCTIVE, MODERATE 04/09/2007  . RENAL INSUFFICIENCY 03/15/2010  . PVD WITH CLAUDICATION 02/10/2010  . Polymyalgia rheumatica 07/26/2008  . POLYARTHRALGIA 07/13/2008  . Other dysphagia 11/21/2009  . Osteoarth NOS-Unspec 03/28/2007  . LUNG NODULE 12/04/2007  . LOW BACK PAIN 04/09/2007  . LEG PAIN, BILATERAL 01/25/2010  . HYPERTENSION 03/28/2007  . HYPERLIPIDEMIA 12/04/2007  . HEMORRHOIDS, RECURRENT 02/11/2008  . GERD 12/04/2007  . ESOPHAGEAL STRICTURE 05/26/2008  . ERECTILE DYSFUNCTION 04/09/2007  . DYSPHAGIA UNSPECIFIED 03/16/2008  . DIVERTICULOSIS, COLON 12/04/2007  . DEPRESSION 12/04/2007  . Degeneration of cervical intervertebral disc 03/28/2007  . COLONIC POLYPS, HX OF 12/04/2007  . CONSTIPATION 09/25/2010  . CEREBROVASCULAR ACCIDENT, HX OF 07/17/2010  .  BENIGN PROSTATIC HYPERTROPHY 04/09/2007  . BACK PAIN 09/16/2009  . ASTHMA 12/04/2007  . ALLERGIC RHINITIS 04/09/2007   Past Surgical History  Procedure Laterality Date  . Total knee arthroplasty      x 2  . Rotator cuff repair      Left   Family History  Problem Relation Age of Onset  . Heart disease Father   . Lupus Brother   . Hypertension Brother   . Asthma Other    History  Substance Use Topics  . Smoking status: Former Research scientist (life sciences)  . Smokeless tobacco: Never Used  . Alcohol Use: No    Review of Systems  Constitutional: Negative for fever.  Musculoskeletal: Positive for arthralgias. Negative for joint swelling.  Skin: Negative for rash and wound.  Neurological: Negative for weakness and numbness.  All other systems reviewed and are negative.     Allergies  Ace inhibitors; Doxycycline; and Sulfa antibiotics  Home Medications   Prior to Admission medications   Medication Sig Start Date End Date Taking? Authorizing Provider  allopurinol (ZYLOPRIM) 100 MG tablet Take 1 tablet (100 mg total) by mouth daily. 10/15/13   Biagio Borg, MD  atorvastatin (LIPITOR) 40 MG tablet take 1 tablet by mouth once daily 10/16/13   Biagio Borg, MD  BYSTOLIC 10 MG tablet take 1 tablet by mouth once daily 11/09/13   Biagio Borg, MD  ciprofloxacin (CIPRO) 500 MG tablet Take 1 tablet (500 mg total) by mouth 2 (two) times daily. 01/19/14   Biagio Borg, MD  colchicine 0.6 MG tablet 1 tab by mouth twice per day as needed 10/15/13   Jeneen Rinks  Quin Hoop, MD  furosemide (LASIX) 40 MG tablet Take 40 mg by mouth daily.    Historical Provider, MD  furosemide (LASIX) 40 MG tablet take 1 tablet by mouth once daily    Biagio Borg, MD  ibuprofen (ADVIL,MOTRIN) 800 MG tablet Take 1 tablet (800 mg total) by mouth 3 (three) times daily. 07/25/13   Wandra Arthurs, MD  naphazoline-pheniramine (NAPHCON-A) 0.025-0.3 % ophthalmic solution Place 1 drop into both eyes 4 (four) times daily as needed for irritation.    Historical  Provider, MD  naproxen (NAPROSYN) 375 MG tablet Take 1 tablet (375 mg total) by mouth 2 (two) times daily with a meal. 03/21/14   Garald Balding, NP  nebivolol (BYSTOLIC) 10 MG tablet Take 10 mg by mouth daily.    Historical Provider, MD  tamsulosin (FLOMAX) 0.4 MG CAPS capsule take 1 capsule by mouth once daily 03/17/14   Biagio Borg, MD  tiZANidine (ZANAFLEX) 4 MG tablet Take 1 tablet (4 mg total) by mouth every 6 (six) hours as needed for muscle spasms. 12/22/13   Biagio Borg, MD  traMADol (ULTRAM) 50 MG tablet Take 1 tablet (50 mg total) by mouth every 8 (eight) hours as needed. 01/19/14   Biagio Borg, MD   BP 155/71  Pulse 65  Temp(Src) 98.3 F (36.8 C) (Oral)  Resp 16  SpO2 99% Physical Exam  Vitals reviewed. Constitutional: He appears well-developed and well-nourished.  Eyes: Pupils are equal, round, and reactive to light.  Neck: Normal range of motion.  Cardiovascular: Normal rate.   Pulmonary/Chest: Effort normal.  Musculoskeletal: Normal range of motion. He exhibits tenderness.       Feet:  Neurological: He is alert.  Skin: Skin is warm. No erythema.   There is no breaking the skin.  Bruising deformity or discoloration    ED Course  Procedures (including critical care time) Labs Review Labs Reviewed - No data to display  Imaging Review No results found.   EKG Interpretation None      MDM   Final diagnoses:  Achilles tendon pain         Garald Balding, NP 03/21/14 [phone]

## 2014-03-21 NOTE — Discharge Instructions (Signed)
Achilles Tendinitis   with Rehab  Achilles tendinitis is a disorder of the Achilles tendon. The Achilles tendon connects the large calf muscles (Gastrocnemius and Soleus) to the heel bone (calcaneus). This tendon is sometimes called the heel cord. It is important for pushing-off and standing on your toes and is important for walking, running, or jumping. Tendinitis is often caused by overuse and repetitive microtrauma.  SYMPTOMS  · Pain, tenderness, swelling, warmth, and redness may occur over the Achilles tendon even at rest.  · Pain with pushing off, or flexing or extending the ankle.  · Pain that is worsened after or during activity.  CAUSES   · Overuse sometimes seen with rapid increase in exercise programs or in sports requiring running and jumping.  · Poor physical conditioning (strength and flexibility or endurance).  · Running sports, especially training running down hills.  · Inadequate warm-up before practice or play or failure to stretch before participation.  · Injury to the tendon.  PREVENTION   · Warm up and stretch before practice or competition.  · Allow time for adequate rest and recovery between practices and competition.  · Keep up conditioning.  ¨ Keep up ankle and leg flexibility.  ¨ Improve or keep muscle strength and endurance.  ¨ Improve cardiovascular fitness.  · Use proper technique.  · Use proper equipment (shoes, skates).  · To help prevent recurrence, taping, protective strapping, or an adhesive bandage may be recommended for several weeks after healing is complete.  PROGNOSIS   · Recovery may take weeks to several months to heal.  · Longer recovery is expected if symptoms have been prolonged.  · Recovery is usually quicker if the inflammation is due to a direct blow as compared with overuse or sudden strain.  RELATED COMPLICATIONS   · Healing time will be prolonged if the condition is not correctly treated. The injury must be given plenty of time to heal.  · Symptoms can reoccur if  activity is resumed too soon.  · Untreated, tendinitis may increase the risk of tendon rupture requiring additional time for recovery and possibly surgery.  TREATMENT   · The first treatment consists of rest anti-inflammatory medication, and ice to relieve the pain.  · Stretching and strengthening exercises after resolution of pain will likely help reduce the risk of recurrence. Referral to a physical therapist or athletic trainer for further evaluation and treatment may be helpful.  · A walking boot or cast may be recommended to rest the Achilles tendon. This can help break the cycle of inflammation and microtrauma.  · Arch supports (orthotics) may be prescribed or recommended by your caregiver as an adjunct to therapy and rest.  · Surgery to remove the inflamed tendon lining or degenerated tendon tissue is rarely necessary and has shown less than predictable results.  MEDICATION   · Nonsteroidal anti-inflammatory medications, such as aspirin and ibuprofen, may be used for pain and inflammation relief. Do not take within 7 days before surgery. Take these as directed by your caregiver. Contact your caregiver immediately if any bleeding, stomach upset, or signs of allergic reaction occur. Other minor pain relievers, such as acetaminophen, may also be used.  · Pain relievers may be prescribed as necessary by your caregiver. Do not take prescription pain medication for longer than 4 to 7 days. Use only as directed and only as much as you need.  · Cortisone injections are rarely indicated. Cortisone injections may weaken tendons and predispose to rupture. It is better   to give the condition more time to heal than to use them.  HEAT AND COLD  · Cold is used to relieve pain and reduce inflammation for acute and chronic Achilles tendinitis. Cold should be applied for 10 to 15 minutes every 2 to 3 hours for inflammation and pain and immediately after any activity that aggravates your symptoms. Use ice packs or an ice  massage.  · Heat may be used before performing stretching and strengthening activities prescribed by your caregiver. Use a heat pack or a warm soak.  SEEK MEDICAL CARE IF:  · Symptoms get worse or do not improve in 2 weeks despite treatment.  · New, unexplained symptoms develop. Drugs used in treatment may produce side effects.  EXERCISES  RANGE OF MOTION (ROM) AND STRETCHING EXERCISES - Achilles Tendinitis   These exercises may help you when beginning to rehabilitate your injury. Your symptoms may resolve with or without further involvement from your physician, physical therapist or athletic trainer. While completing these exercises, remember:   · Restoring tissue flexibility helps normal motion to return to the joints. This allows healthier, less painful movement and activity.  · An effective stretch should be held for at least 30 seconds.  · A stretch should never be painful. You should only feel a gentle lengthening or release in the stretched tissue.  STRETCH - Gastroc, Standing   · Place hands on wall.  · Extend right / left leg, keeping the front knee somewhat bent.  · Slightly point your toes inward on your back foot.  · Keeping your right / left heel on the floor and your knee straight, shift your weight toward the wall, not allowing your back to arch.  · You should feel a gentle stretch in the right / left calf. Hold this position for __________ seconds.  Repeat __________ times. Complete this stretch __________ times per day.  STRETCH - Soleus, Standing   · Place hands on wall.  · Extend right / left leg, keeping the other knee somewhat bent.  · Slightly point your toes inward on your back foot.  · Keep your right / left heel on the floor, bend your back knee, and slightly shift your weight over the back leg so that you feel a gentle stretch deep in your back calf.  · Hold this position for __________ seconds.  Repeat __________ times. Complete this stretch __________ times per day.  STRETCH -  Gastrocsoleus, Standing   Note: This exercise can place a lot of stress on your foot and ankle. Please complete this exercise only if specifically instructed by your caregiver.   · Place the ball of your right / left foot on a step, keeping your other foot firmly on the same step.  · Hold on to the wall or a rail for balance.  · Slowly lift your other foot, allowing your body weight to press your heel down over the edge of the step.  · You should feel a stretch in your right / left calf.  · Hold this position for __________ seconds.  · Repeat this exercise with a slight bend in your knee.  Repeat __________ times. Complete this stretch __________ times per day.   STRENGTHENING EXERCISES - Achilles Tendinitis  These exercises may help you when beginning to rehabilitate your injury. They may resolve your symptoms with or without further involvement from your physician, physical therapist or athletic trainer. While completing these exercises, remember:   · Muscles can gain both the endurance   and the strength needed for everyday activities through controlled exercises.  · Complete these exercises as instructed by your physician, physical therapist or athletic trainer. Progress the resistance and repetitions only as guided.  · You may experience muscle soreness or fatigue, but the pain or discomfort you are trying to eliminate should never worsen during these exercises. If this pain does worsen, stop and make certain you are following the directions exactly. If the pain is still present after adjustments, discontinue the exercise until you can discuss the trouble with your clinician.  STRENGTH - Plantar-flexors   · Sit with your right / left leg extended. Holding onto both ends of a rubber exercise band/tubing, loop it around the ball of your foot. Keep a slight tension in the band.  · Slowly push your toes away from you, pointing them downward.  · Hold this position for __________ seconds. Return slowly, controlling the  tension in the band/tubing.  Repeat __________ times. Complete this exercise __________ times per day.   STRENGTH - Plantar-flexors   · Stand with your feet shoulder width apart. Steady yourself with a wall or table using as little support as needed.  · Keeping your weight evenly spread over the width of your feet, rise up on your toes.*  · Hold this position for __________ seconds.  Repeat __________ times. Complete this exercise __________ times per day.   *If this is too easy, shift your weight toward your right / left leg until you feel challenged. Ultimately, you may be asked to do this exercise with your right / left foot only.  STRENGTH - Plantar-flexors, Eccentric   Note: This exercise can place a lot of stress on your foot and ankle. Please complete this exercise only if specifically instructed by your caregiver.   · Place the balls of your feet on a step. With your hands, use only enough support from a wall or rail to keep your balance.  · Keep your knees straight and rise up on your toes.  · Slowly shift your weight entirely to your right / left toes and pick up your opposite foot. Gently and with controlled movement, lower your weight through your right / left foot so that your heel drops below the level of the step. You will feel a slight stretch in the back of your calf at the end position.  · Use the healthy leg to help rise up onto the balls of both feet, then lower weight only on the right / left leg again. Build up to 15 repetitions. Then progress to 3 consecutive sets of 15 repetitions.*  · After completing the above exercise, complete the same exercise with a slight knee bend (about 30 degrees). Again, build up to 15 repetitions. Then progress to 3 consecutive sets of 15 repetitions.*  Perform this exercise __________ times per day.   *When you easily complete 3 sets of 15, your physician, physical therapist or athletic trainer may advise you to add resistance by wearing a backpack filled with  additional weight.  STRENGTH - Plantar Flexors, Seated   · Sit on a chair that allows your feet to rest flat on the ground. If necessary, sit at the edge of the chair.  · Keeping your toes firmly on the ground, lift your right / left heel as far as you can without increasing any discomfort in your ankle.  Repeat __________ times. Complete this exercise __________ times a day.  *If instructed by your physician, physical therapist or athletic   trainer, you may add ____________________ of resistance by placing a weighted object on your right / left knee.  Document Released: 02/28/2005 Document Revised: 10/22/2011 Document Reviewed: 11/11/2008  [employer]® Patient Information ©2015 [employer]. This information is not intended to replace advice given to you by your health care provider. Make sure you discuss any questions you have with your health care provider.

## 2014-04-21 ENCOUNTER — Ambulatory Visit: Payer: PRIVATE HEALTH INSURANCE | Admitting: Internal Medicine

## 2014-04-22 ENCOUNTER — Ambulatory Visit: Payer: PRIVATE HEALTH INSURANCE | Admitting: Internal Medicine

## 2014-04-28 ENCOUNTER — Ambulatory Visit: Payer: PRIVATE HEALTH INSURANCE | Admitting: Internal Medicine

## 2014-05-04 ENCOUNTER — Ambulatory Visit (INDEPENDENT_AMBULATORY_CARE_PROVIDER_SITE_OTHER): Payer: PRIVATE HEALTH INSURANCE | Admitting: Internal Medicine

## 2014-05-04 ENCOUNTER — Encounter: Payer: Self-pay | Admitting: Internal Medicine

## 2014-05-04 VITALS — BP 142/80 | HR 60 | Temp 97.7°F | Ht 70.0 in | Wt 295.1 lb

## 2014-05-04 DIAGNOSIS — M25551 Pain in right hip: Secondary | ICD-10-CM | POA: Insufficient documentation

## 2014-05-04 DIAGNOSIS — I10 Essential (primary) hypertension: Secondary | ICD-10-CM

## 2014-05-04 DIAGNOSIS — N183 Chronic kidney disease, stage 3 unspecified: Secondary | ICD-10-CM

## 2014-05-04 DIAGNOSIS — M25559 Pain in unspecified hip: Secondary | ICD-10-CM

## 2014-05-04 MED ORDER — NEBIVOLOL HCL 10 MG PO TABS: ORAL_TABLET | ORAL | Status: DC

## 2014-05-04 NOTE — Assessment & Plan Note (Signed)
Mild elev persistent, for increased bystolic 20 qd,  to f/u any worsening symptoms or concerns

## 2014-05-04 NOTE — Assessment & Plan Note (Signed)
Suspect right hip bursitis, for tylenol prn for now, tramadol prn, refer Dr Smith/sport medicine,  to f/u any worsening symptoms or concerns

## 2014-05-04 NOTE — Patient Instructions (Addendum)
OK to increase the bystolic to 20 mg per day (two of the 10 mg pills)  Please continue all other medications as before, including the tramadol for pain  Please have the pharmacy call with any other refills you may need.  Please keep your appointments with your specialists as you may have planned  You will be contacted regarding the referral for: Dr Smith/sport medicine  Please go to the LAB in the Basement (turn left off the elevator) for the tests to be done today - just the kidney function today  You will be contacted by phone if any changes need to be made immediately.  Otherwise, you will receive a letter about your results with an explanation, but please check with MyChart first.  Please remember to sign up for MyChart if you have not done so, as this will be important to you in the future with finding out test results, communicating by private email, and scheduling acute appointments online when needed.  Please return in 6 months, or sooner if needed, with lab testing 3-5 days ahead

## 2014-05-04 NOTE — Progress Notes (Signed)
Subjective:    Patient ID: Troy Palmer, male    DOB: 02/11/1946, 68 y.o.   MRN: [id number]  HPI  Here to f/u; overall doing ok,  Pt denies chest pain, increased sob or doe, wheezing, orthopnea, PND, increased LE swelling, palpitations, dizziness or syncope.  Pt denies polydipsia, polyuria, or low sugar symptoms such as weakness or confusion improved with po intake.  Pt denies new neurological symptoms such as new headache, or facial or extremity weakness or numbness.   Pt states overall good compliance with meds, has been trying to follow lower cholesterol diet, with wt overall stable. Does have recurring constipation with the tramadol .    Going to GYM almost every day.  Does also have pain to right lateral hip area, worse to lie on right side, several wks, sharp and burning, no radiation, better to not lie on right side. Concerned he may need hip replacement as several of his sibs have. No Further testicle pain after antibx last viist Past Medical History  Diagnosis Date  . TB SKIN TEST, POSITIVE 03/28/2007  . SLEEP APNEA, OBSTRUCTIVE, MODERATE 04/09/2007  . RENAL INSUFFICIENCY 03/15/2010  . PVD WITH CLAUDICATION 02/10/2010  . Polymyalgia rheumatica 07/26/2008  . POLYARTHRALGIA 07/13/2008  . Other dysphagia 11/21/2009  . Osteoarth NOS-Unspec 03/28/2007  . LUNG NODULE 12/04/2007  . LOW BACK PAIN 04/09/2007  . LEG PAIN, BILATERAL 01/25/2010  . HYPERTENSION 03/28/2007  . HYPERLIPIDEMIA 12/04/2007  . HEMORRHOIDS, RECURRENT 02/11/2008  . GERD 12/04/2007  . ESOPHAGEAL STRICTURE 05/26/2008  . ERECTILE DYSFUNCTION 04/09/2007  . DYSPHAGIA UNSPECIFIED 03/16/2008  . DIVERTICULOSIS, COLON 12/04/2007  . DEPRESSION 12/04/2007  . Degeneration of cervical intervertebral disc 03/28/2007  . COLONIC POLYPS, HX OF 12/04/2007  . CONSTIPATION 09/25/2010  . CEREBROVASCULAR ACCIDENT, HX OF 07/17/2010  . BENIGN PROSTATIC HYPERTROPHY 04/09/2007  . BACK PAIN 09/16/2009  . ASTHMA 12/04/2007  . ALLERGIC RHINITIS 04/09/2007   Past  Surgical History  Procedure Laterality Date  . Total knee arthroplasty      x 2  . Rotator cuff repair      Left    reports that he has quit smoking. He has never used smokeless tobacco. He reports that he does not drink alcohol or use illicit drugs. family history includes Asthma in his other; Heart disease in his father; Hypertension in his brother; Lupus in his brother. Allergies  Allergen Reactions  . Ace Inhibitors Swelling    Angioedema throat  . Doxycycline     Some GI upset  . Sulfa Antibiotics Hives    And facial angioedema   Current Outpatient Prescriptions on File Prior to Visit  Medication Sig Dispense Refill  . allopurinol (ZYLOPRIM) 100 MG tablet Take 1 tablet (100 mg total) by mouth daily.  90 tablet  3  . atorvastatin (LIPITOR) 40 MG tablet take 1 tablet by mouth once daily  90 tablet  3  . colchicine 0.6 MG tablet 1 tab by mouth twice per day as needed  60 tablet  5  . furosemide (LASIX) 40 MG tablet Take 40 mg by mouth daily.      . naphazoline-pheniramine (NAPHCON-A) 0.025-0.3 % ophthalmic solution Place 1 drop into both eyes 4 (four) times daily as needed for irritation.      . naproxen (NAPROSYN) 375 MG tablet Take 1 tablet (375 mg total) by mouth 2 (two) times daily with a meal.  30 tablet  0  . tamsulosin (FLOMAX) 0.4 MG CAPS capsule take 1 capsule by mouth  once daily  90 capsule  3  . tiZANidine (ZANAFLEX) 4 MG tablet Take 1 tablet (4 mg total) by mouth every 6 (six) hours as needed for muscle spasms.  60 tablet  2  . traMADol (ULTRAM) 50 MG tablet Take 1 tablet (50 mg total) by mouth every 8 (eight) hours as needed.  60 tablet  0   No current facility-administered medications on file prior to visit.   Review of Systems  Constitutional: Negative for unusual diaphoresis or other sweats  HENT: Negative for ringing in ear Eyes: Negative for double vision or worsening visual disturbance.  Respiratory: Negative for choking and stridor.   Gastrointestinal:  Negative for vomiting or other signifcant bowel change Genitourinary: Negative for hematuria or decreased urine volume.  Musculoskeletal: Negative for other MSK pain or swelling Skin: Negative for color change and worsening wound.  Neurological: Negative for tremors and numbness other than noted  Psychiatric/Behavioral: Negative for decreased concentration or agitation other than above       Objective:   Physical Exam BP 142/80  Pulse 60  Temp(Src) 97.7 F (36.5 C) (Oral)  Ht 5\' 10"  (1.778 m)  Wt 295 lb 2 oz (133.868 kg)  BMI 42.35 kg/m2  SpO2 97% VS noted,  Constitutional: Pt appears well-developed, well-nourished.  HENT: Head: NCAT.  Right Ear: External ear normal.  Left Ear: External ear normal.  Eyes: . Pupils are equal, round, and reactive to light. Conjunctivae and EOM are normal Neck: Normal range of motion. Neck supple.  Cardiovascular: Normal rate and regular rhythm.   Pulmonary/Chest: Effort normal and breath sounds normal.  Abd:  Soft, NT, ND, + BS Right lateral hip area with tender over greater trochanter  Neurological: Pt is alert. Not confused , motor grossly intact, dtr intact Skin: Skin is warm. No rash, no LE edema Psychiatric: Pt behavior is normal. No agitation.     Assessment & Plan:   BP Readings from Last 3 Encounters:  05/04/14 142/80  03/21/14 155/71  01/19/14 122/72   Wt Readings from Last 3 Encounters:  05/04/14 295 lb 2 oz (133.868 kg)  01/19/14 293 lb (132.904 kg)  12/22/13 292 lb 2 oz (132.507 kg)

## 2014-05-04 NOTE — Assessment & Plan Note (Signed)
stable overall by history and exam, recent data reviewed with pt, and pt to continue medical treatment as before,  to f/u any worsening symptoms or concerns Lab Results  Component Value Date   CREATININE 1.7* 10/15/2013   For f/u lab today, avoid nsaids

## 2014-05-04 NOTE — Progress Notes (Signed)
Pre visit review using our clinic review tool, if applicable. No additional management support is needed unless otherwise documented below in the visit note. 

## 2014-05-05 ENCOUNTER — Other Ambulatory Visit (INDEPENDENT_AMBULATORY_CARE_PROVIDER_SITE_OTHER): Payer: PRIVATE HEALTH INSURANCE

## 2014-05-05 ENCOUNTER — Encounter: Payer: Self-pay | Admitting: Family Medicine

## 2014-05-05 ENCOUNTER — Ambulatory Visit (INDEPENDENT_AMBULATORY_CARE_PROVIDER_SITE_OTHER): Payer: PRIVATE HEALTH INSURANCE | Admitting: Family Medicine

## 2014-05-05 ENCOUNTER — Ambulatory Visit: Payer: PRIVATE HEALTH INSURANCE | Admitting: Family Medicine

## 2014-05-05 VITALS — BP 124/66 | HR 56 | Ht 70.0 in | Wt 295.0 lb

## 2014-05-05 DIAGNOSIS — M76899 Other specified enthesopathies of unspecified lower limb, excluding foot: Secondary | ICD-10-CM

## 2014-05-05 DIAGNOSIS — M7061 Trochanteric bursitis, right hip: Secondary | ICD-10-CM

## 2014-05-05 DIAGNOSIS — M25551 Pain in right hip: Secondary | ICD-10-CM

## 2014-05-05 DIAGNOSIS — M25559 Pain in unspecified hip: Secondary | ICD-10-CM

## 2014-05-05 NOTE — Assessment & Plan Note (Signed)
Patient was given an injection today. Patient tolerated the procedure fairly well. Patient did have improve pain afterwards. Patient given home exercise program as well as an icing protocol. Patient already has other pain medications from providers. Patient will continue those on an as-needed basis. Patient will follow up again in 3 weeks for further evaluation and treatment. He continued to have pain we may need to consider further imaging such as hip x-rays as well as formal physical therapy. Will discuss at followup.

## 2014-05-05 NOTE — Patient Instructions (Signed)
Good to see you Work on hip abductors.  Ice 20 minutes 2 times daily. Usually after activity and before bed. Exercises 3 times a week.  Come back in 3 weeks if not perfect.

## 2014-05-05 NOTE — Progress Notes (Signed)
[facility] [place],  [address] Phone: [phone] Subjective:    I'm seeing this patient by the request  of:  Cathlean Cower, MD   CC: Right hip pain  [id number] Troy Palmer is a 68 y.o. male coming in with complaint of right hip pain. Patient has a past medical history significant for polymyalgia rheumatica. Patient states that he has had lateral hip pain since he's had his knee replacement but over the course of the last several weeks it has started to increase significantly. Patient states that it hurts more after sitting for a long amount of time and when he tries to get up to walk. Take sometimes to improve with walking. Patient states that he can wake him up at night if he rolls onto that side as well. Patient with the severity of 8/10. States that he can radiate down the lateral aspect of the leg and denies any back pain it seems to be associated with this. Denies any weakness of the leg as well.     Past medical history, social, surgical and family history all reviewed in electronic medical record.   Review of Systems: No headache, visual changes, nausea, vomiting, diarrhea, constipation, dizziness, abdominal pain, skin rash, fevers, chills, night sweats, weight loss, swollen lymph nodes, body aches, joint swelling, muscle aches, chest pain, shortness of breath, mood changes.   Objective Blood pressure 124/66, pulse 56, height 5\' 10"  (1.778 m), weight 295 lb (133.811 kg), SpO2 96.00%.  General: No apparent distress alert and oriented x3 mood and affect normal, dressed appropriately. Obese HEENT: Pupils equal, extraocular movements intact  Respiratory: Patient's speak in full sentences and does not appear short of breath  Cardiovascular: No lower extremity edema, non tender, no erythema  Skin: Warm dry intact with no signs of infection or rash on extremities or on axial skeleton.  Abdomen: Soft nontender  Neuro: Cranial nerves  II through XII are intact, neurovascularly intact in all extremities with 2+ DTRs and 2+ pulses.  Lymph: No lymphadenopathy of posterior or anterior cervical chain or axillae bilaterally.  Gait normal with good balance and coordination.  MSK:  Non tender with full range of motion and good stability and symmetric strength and tone of shoulders, elbows, wrist, knee and ankles bilaterally. Patient has had a total knee replacement on the right side.  MSK US performed of: Right This study was ordered, performed, and interpreted by Charlann Boxer D.O.  Hip: Trochanteric bursa with significant hypoechoic changes and swelling Acetabular labrum visualized and without tears, displacement, or effusion in joint. Femoral neck appears unremarkable without increased power doppler signal along Cortex.  IMPRESSION:  Greater trochanter bursitis   Procedure: Real-time Ultrasound Guided Injection of right greater trochanteric bursitis secondary to patient's body habitus Device: GE Logiq E  Ultrasound guided injection is preferred based studies that show increased duration, increased effect, greater accuracy, decreased procedural pain, increased response rate, and decreased cost with ultrasound guided versus blind injection.  Verbal informed consent obtained.  Time-out conducted.  Noted no overlying erythema, induration, or other signs of local infection.  Skin prepped in a sterile fashion.  Local anesthesia: Topical Ethyl chloride.  With sterile technique and under real time ultrasound guidance:  Greater trochanteric area was visualized and patient's bursa was noted. A 22-gauge 3 inch needle was inserted and 4 cc of 0.5% Marcaine and 1 cc of Kenalog 40 mg/dL was injected. Pictures taken Completed without difficulty  Pain immediately resolved suggesting  accurate placement of the medication.  Advised to call if fevers/chills, erythema, induration, drainage, or persistent bleeding.  Images permanently stored and  available for review in the ultrasound unit.  Impression: Technically successful ultrasound guided injection.       Impression and Recommendations:     This case required medical decision making of moderate complexity.

## 2014-05-09 ENCOUNTER — Other Ambulatory Visit: Payer: Self-pay | Admitting: Internal Medicine

## 2014-05-09 DIAGNOSIS — Z0000 Encounter for general adult medical examination without abnormal findings: Secondary | ICD-10-CM

## 2014-05-09 DIAGNOSIS — R7302 Impaired glucose tolerance (oral): Secondary | ICD-10-CM

## 2014-05-27 ENCOUNTER — Encounter: Payer: Self-pay | Admitting: Gastroenterology

## 2014-05-28 ENCOUNTER — Other Ambulatory Visit: Payer: Self-pay

## 2014-06-16 ENCOUNTER — Telehealth (HOSPITAL_COMMUNITY): Payer: Self-pay | Admitting: Internal Medicine

## 2014-06-16 NOTE — Telephone Encounter (Signed)
Called patient to schedule AWV and stated that due to insurance [facility]) that now has a new PCP.

## 2014-06-16 NOTE — Telephone Encounter (Deleted)
What is this message pertaining to?

## 2014-07-29 ENCOUNTER — Ambulatory Visit: Payer: PRIVATE HEALTH INSURANCE | Admitting: Internal Medicine

## 2014-08-31 DIAGNOSIS — R5383 Other fatigue: Secondary | ICD-10-CM | POA: Diagnosis not present

## 2014-08-31 DIAGNOSIS — N400 Enlarged prostate without lower urinary tract symptoms: Secondary | ICD-10-CM | POA: Diagnosis not present

## 2014-08-31 DIAGNOSIS — E785 Hyperlipidemia, unspecified: Secondary | ICD-10-CM | POA: Diagnosis not present

## 2014-08-31 DIAGNOSIS — E049 Nontoxic goiter, unspecified: Secondary | ICD-10-CM | POA: Diagnosis not present

## 2014-08-31 DIAGNOSIS — M1990 Unspecified osteoarthritis, unspecified site: Secondary | ICD-10-CM | POA: Diagnosis not present

## 2014-08-31 DIAGNOSIS — E039 Hypothyroidism, unspecified: Secondary | ICD-10-CM | POA: Diagnosis not present

## 2014-08-31 DIAGNOSIS — I10 Essential (primary) hypertension: Secondary | ICD-10-CM | POA: Diagnosis not present

## 2014-08-31 DIAGNOSIS — T7840XA Allergy, unspecified, initial encounter: Secondary | ICD-10-CM | POA: Diagnosis not present

## 2014-09-01 DIAGNOSIS — M479 Spondylosis, unspecified: Secondary | ICD-10-CM | POA: Diagnosis not present

## 2014-09-01 DIAGNOSIS — M25551 Pain in right hip: Secondary | ICD-10-CM | POA: Diagnosis not present

## 2014-10-28 ENCOUNTER — Other Ambulatory Visit: Payer: Self-pay | Admitting: Internal Medicine

## 2014-10-28 ENCOUNTER — Telehealth: Payer: Self-pay

## 2014-10-28 DIAGNOSIS — M5441 Lumbago with sciatica, right side: Secondary | ICD-10-CM | POA: Diagnosis not present

## 2014-10-28 DIAGNOSIS — M109 Gout, unspecified: Secondary | ICD-10-CM | POA: Diagnosis not present

## 2014-10-28 NOTE — Telephone Encounter (Signed)
Patient states he recd flu vaccine in October 2015

## 2014-10-30 ENCOUNTER — Other Ambulatory Visit: Payer: Self-pay | Admitting: Internal Medicine

## 2014-11-02 ENCOUNTER — Ambulatory Visit: Payer: PRIVATE HEALTH INSURANCE | Admitting: Internal Medicine

## 2014-11-09 ENCOUNTER — Other Ambulatory Visit: Payer: Self-pay | Admitting: Internal Medicine

## 2014-11-09 DIAGNOSIS — M5441 Lumbago with sciatica, right side: Secondary | ICD-10-CM

## 2014-11-11 DIAGNOSIS — N183 Chronic kidney disease, stage 3 (moderate): Secondary | ICD-10-CM | POA: Diagnosis not present

## 2014-11-11 DIAGNOSIS — M109 Gout, unspecified: Secondary | ICD-10-CM | POA: Diagnosis not present

## 2014-11-11 DIAGNOSIS — I129 Hypertensive chronic kidney disease with stage 1 through stage 4 chronic kidney disease, or unspecified chronic kidney disease: Secondary | ICD-10-CM | POA: Diagnosis not present

## 2014-11-26 ENCOUNTER — Ambulatory Visit
Admission: RE | Admit: 2014-11-26 | Discharge: 2014-11-26 | Disposition: A | Discharge status: Home / Self Care | Payer: Medicare HMO | Source: Ambulatory Visit | Attending: Internal Medicine | Admitting: Internal Medicine

## 2014-11-26 DIAGNOSIS — M5441 Lumbago with sciatica, right side: Secondary | ICD-10-CM

## 2014-12-21 ENCOUNTER — Other Ambulatory Visit: Payer: Self-pay | Admitting: Internal Medicine

## 2015-01-13 ENCOUNTER — Other Ambulatory Visit: Payer: Self-pay | Admitting: Internal Medicine

## 2015-03-16 ENCOUNTER — Other Ambulatory Visit: Payer: Self-pay | Admitting: Internal Medicine

## 2015-04-18 ENCOUNTER — Other Ambulatory Visit: Payer: Self-pay | Admitting: Internal Medicine

## 2015-05-07 ENCOUNTER — Other Ambulatory Visit: Payer: Self-pay | Admitting: Internal Medicine

## 2015-06-16 ENCOUNTER — Other Ambulatory Visit: Payer: Self-pay | Admitting: Internal Medicine

## 2015-07-18 ENCOUNTER — Other Ambulatory Visit: Payer: Self-pay | Admitting: Internal Medicine

## 2015-07-23 ENCOUNTER — Other Ambulatory Visit: Payer: Self-pay | Admitting: Internal Medicine

## 2015-08-18 DIAGNOSIS — J0140 Acute pansinusitis, unspecified: Secondary | ICD-10-CM | POA: Diagnosis not present

## 2015-08-18 DIAGNOSIS — M5441 Lumbago with sciatica, right side: Secondary | ICD-10-CM | POA: Insufficient documentation

## 2015-08-18 DIAGNOSIS — M48061 Spinal stenosis, lumbar region without neurogenic claudication: Secondary | ICD-10-CM | POA: Insufficient documentation

## 2015-08-18 DIAGNOSIS — I509 Heart failure, unspecified: Secondary | ICD-10-CM | POA: Insufficient documentation

## 2015-08-18 DIAGNOSIS — M109 Gout, unspecified: Secondary | ICD-10-CM | POA: Insufficient documentation

## 2015-08-23 ENCOUNTER — Other Ambulatory Visit: Payer: Self-pay | Admitting: Internal Medicine

## 2015-08-29 DIAGNOSIS — N183 Chronic kidney disease, stage 3 (moderate): Secondary | ICD-10-CM | POA: Diagnosis not present

## 2015-08-29 DIAGNOSIS — Z6841 Body Mass Index (BMI) 40.0 and over, adult: Secondary | ICD-10-CM | POA: Diagnosis not present

## 2015-08-29 DIAGNOSIS — I129 Hypertensive chronic kidney disease with stage 1 through stage 4 chronic kidney disease, or unspecified chronic kidney disease: Secondary | ICD-10-CM | POA: Diagnosis not present

## 2015-09-20 DIAGNOSIS — I129 Hypertensive chronic kidney disease with stage 1 through stage 4 chronic kidney disease, or unspecified chronic kidney disease: Secondary | ICD-10-CM | POA: Diagnosis not present

## 2015-09-20 DIAGNOSIS — J309 Allergic rhinitis, unspecified: Secondary | ICD-10-CM | POA: Diagnosis not present

## 2015-09-20 DIAGNOSIS — R1013 Epigastric pain: Secondary | ICD-10-CM | POA: Diagnosis not present

## 2015-09-20 DIAGNOSIS — E7800 Pure hypercholesterolemia, unspecified: Secondary | ICD-10-CM | POA: Diagnosis not present

## 2015-09-20 DIAGNOSIS — N183 Chronic kidney disease, stage 3 (moderate): Secondary | ICD-10-CM | POA: Diagnosis not present

## 2015-09-27 DIAGNOSIS — R1013 Epigastric pain: Secondary | ICD-10-CM | POA: Diagnosis not present

## 2015-09-27 DIAGNOSIS — N189 Chronic kidney disease, unspecified: Secondary | ICD-10-CM | POA: Diagnosis not present

## 2015-09-27 DIAGNOSIS — K760 Fatty (change of) liver, not elsewhere classified: Secondary | ICD-10-CM | POA: Diagnosis not present

## 2015-10-19 ENCOUNTER — Ambulatory Visit (INDEPENDENT_AMBULATORY_CARE_PROVIDER_SITE_OTHER): Payer: PPO | Admitting: Family Medicine

## 2015-10-19 VITALS — BP 140/70 | HR 54 | Temp 97.5°F | Resp 14 | Ht 69.0 in | Wt 301.0 lb

## 2015-10-19 DIAGNOSIS — J029 Acute pharyngitis, unspecified: Secondary | ICD-10-CM | POA: Diagnosis not present

## 2015-10-19 DIAGNOSIS — R05 Cough: Secondary | ICD-10-CM | POA: Diagnosis not present

## 2015-10-19 DIAGNOSIS — R059 Cough, unspecified: Secondary | ICD-10-CM

## 2015-10-19 MED ORDER — AMOXICILLIN-POT CLAVULANATE 875-125 MG PO TABS: 1.0000 | ORAL_TABLET | Freq: Two times a day (BID) | ORAL | Status: DC

## 2015-10-19 MED ORDER — HYDROCODONE-HOMATROPINE 5-1.5 MG/5ML PO SYRP: 5.0000 mL | ORAL_SOLUTION | Freq: Three times a day (TID) | ORAL | Status: DC | PRN

## 2015-10-19 NOTE — Progress Notes (Signed)
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 Both his granddaughter subinsular recently.   Patient began feeling ill 4 days ago he developed a sore throat , headache , and weakness with myalgias. He's not had a fever however. He says he has some rumbling in his stomach and some loose stools but no frank diarrhea or vomiting.    patient complains about a cough over the last 3 days as well.  Patient's had no rash.   Objective: Patient appears to be in no acute distress BP 140/70 mmHg  Pulse 54  Temp(Src) 97.5 F (36.4 C) (Oral)  Resp 14  Ht 5\' 9"  (1.753 m)  Wt 301 lb (136.533 kg)  BMI 44.43 kg/m2  SpO2 96%  HEENT: Patient's throat is quite red and mildly swollen with some white exudates. His neck is supple and he has no adenopathy. His TMs are normal.  Chest: Clear  Heart: Regular no murmur  Extremities: No edema  Acute pharyngitis, unspecified etiology - Plan: amoxicillin-clavulanate (AUGMENTIN) 875-125 MG tablet  Cough - Plan: amoxicillin-clavulanate (AUGMENTIN) 875-125 MG tablet, HYDROcodone-homatropine (HYCODAN) 5-1.5 MG/5ML syrup  Robyn Haber, MD

## 2015-10-19 NOTE — Patient Instructions (Signed)
Please let me know if you're not feeling better by Saturday. I'm expecting that the antibiotic will take a couple days start getting the symptoms under control.

## 2015-11-13 ENCOUNTER — Ambulatory Visit (INDEPENDENT_AMBULATORY_CARE_PROVIDER_SITE_OTHER): Payer: PPO

## 2015-11-13 ENCOUNTER — Ambulatory Visit (INDEPENDENT_AMBULATORY_CARE_PROVIDER_SITE_OTHER): Payer: PPO | Admitting: Family Medicine

## 2015-11-13 VITALS — BP 130/70 | HR 52 | Temp 97.8°F | Resp 16 | Ht 69.0 in | Wt 291.0 lb

## 2015-11-13 DIAGNOSIS — R9431 Abnormal electrocardiogram [ECG] [EKG]: Secondary | ICD-10-CM | POA: Diagnosis not present

## 2015-11-13 DIAGNOSIS — M25512 Pain in left shoulder: Secondary | ICD-10-CM

## 2015-11-13 DIAGNOSIS — R079 Chest pain, unspecified: Secondary | ICD-10-CM | POA: Diagnosis not present

## 2015-11-13 DIAGNOSIS — R5381 Other malaise: Secondary | ICD-10-CM | POA: Diagnosis not present

## 2015-11-13 DIAGNOSIS — M25511 Pain in right shoulder: Secondary | ICD-10-CM | POA: Diagnosis not present

## 2015-11-13 DIAGNOSIS — R5383 Other fatigue: Secondary | ICD-10-CM

## 2015-11-13 DIAGNOSIS — R51 Headache: Secondary | ICD-10-CM

## 2015-11-13 DIAGNOSIS — M542 Cervicalgia: Secondary | ICD-10-CM

## 2015-11-13 DIAGNOSIS — R05 Cough: Secondary | ICD-10-CM

## 2015-11-13 DIAGNOSIS — R001 Bradycardia, unspecified: Secondary | ICD-10-CM

## 2015-11-13 DIAGNOSIS — R059 Cough, unspecified: Secondary | ICD-10-CM

## 2015-11-13 DIAGNOSIS — M791 Myalgia, unspecified site: Secondary | ICD-10-CM

## 2015-11-13 DIAGNOSIS — N189 Chronic kidney disease, unspecified: Secondary | ICD-10-CM

## 2015-11-13 DIAGNOSIS — R519 Headache, unspecified: Secondary | ICD-10-CM

## 2015-11-13 LAB — POCT CBC
Granulocyte percent: 50.8 %G (ref 37–80)
HCT, POC: 41.7 % — AB (ref 43.5–53.7)
Hemoglobin: 14.3 g/dL (ref 14.1–18.1)
Lymph, poc: 1.9 (ref 0.6–3.4)
MCH, POC: 28.3 pg (ref 27–31.2)
MCHC: 34.4 g/dL (ref 31.8–35.4)
MCV: 82.1 fL (ref 80–97)
MID (cbc): 0.5 (ref 0–0.9)
MPV: 7.6 fL (ref 0–99.8)
POC Granulocyte: 2.5 (ref 2–6.9)
POC LYMPH PERCENT: 39.5 %L (ref 10–50)
POC MID %: 9.7 %M (ref 0–12)
Platelet Count, POC: 207 10*3/uL (ref 142–424)
RBC: 5.07 M/uL (ref 4.69–6.13)
RDW, POC: 14.1 %
WBC: 4.9 10*3/uL (ref 4.6–10.2)

## 2015-11-13 LAB — BASIC METABOLIC PANEL
BUN: 34 mg/dL — ABNORMAL HIGH (ref 7–25)
CHLORIDE: 106 mmol/L (ref 98–110)
CO2: 27 mmol/L (ref 20–31)
CREATININE: 2.17 mg/dL — AB (ref 0.70–1.25)
Calcium: 9.2 mg/dL (ref 8.6–10.3)
Glucose, Bld: 92 mg/dL (ref 65–99)
POTASSIUM: 4.7 mmol/L (ref 3.5–5.3)
Sodium: 139 mmol/L (ref 135–146)

## 2015-11-13 LAB — POCT INFLUENZA A/B
Influenza A, POC: NEGATIVE
Influenza B, POC: NEGATIVE

## 2015-11-13 NOTE — Progress Notes (Signed)
Subjective:    Patient ID: Troy Palmer, male    DOB: [birth year], 70 y.o.   MRN: [id number]  HPI  Troy Palmer is a 70 y.o. male  PCP is Cathlean Cower, MD next appt with PCP in 3 days.   Hx of multiple medical problems as below Including polymyalgia rheumatica, gout, chronic lower extremity edema, hyperlipidemia, and obesity. Presenting today with generalized bodyaches, backache, headache and abdominal pain.  Started yesterday with HA, shoulder pain, fatigue, nausea.  Still didn't feel well this morning. Slight cough, but dry and not new. No measured fevers. One episode of feeling hot yesterday. No recent diarrhea. No rhinorrhea. Less po fluids today, but able to eat and drink last night. Has had occasional ache in back and shoulders, head in the past, but usually goes away.   Noted chest pain yesterday about 1pm in the car. Again yesterday evening at 9pm- felt someone stabbing in chest - sharp pain. Had not eaten recently. Associated with nausea, but no diaphoresis or shortness of breath.  First time lasted 30 minutes, second time lasted 15 minutes, then went away.  Center of chest and to the left.  No relief with moving positions or pushing on it. Has not had chest pain yet today. Reports normal stress test 2 years ago (However in [place], stress Myoview was ordered in 2015, but never done).  No known hx of MI/CAD.  Hx of GERD and esophageal stricture, but had not had pain like this with those conditions. Longstanding shortness of breath, but not changed with recent symptoms.   Hx chronic kidney disease, followed in [facility]. Had blood work done approximately a month ago, he states was okay.  Was treated a few weeks ago with Augmentin for sore throat, but he was told he had the flu?  Did note exudate on tonsils based on last note.   Patient Active Problem List   Diagnosis Date Noted  . Greater trochanteric bursitis of right hip 05/05/2014  . Right hip pain 05/04/2014  . Posterior neck  pain 12/22/2013  . Orchitis, right 12/22/2013  . Paresthesia of both feet 12/22/2013  . Bilateral shoulder pain 09/16/2013  . Allergic angioedema 07/31/2012  . Impaired glucose tolerance 04/15/2012  . Chest pain 02/24/2012  . Foot pain, bilateral 02/20/2012  . Leg pain, bilateral 01/19/2012  . Edema 01/16/2012  . Dysphagia 11/21/2011  . Gout 06/07/2011  . CKD (chronic kidney disease) 06/07/2011  . Preventative health care 02/14/2011  . Encounter for long-term (current) use of other medications 11/08/2010  . CONSTIPATION 09/25/2010  . Memory loss 06/28/2010  . PVD (peripheral vascular disease) ([place]) 02/10/2010  . LEG PAIN, BILATERAL 01/25/2010  . BACK PAIN 09/16/2009  . Polymyalgia rheumatica ([place]) 07/26/2008  . POLYARTHRALGIA 07/13/2008  . ESOPHAGEAL STRICTURE 05/26/2008  . HEMORRHOIDS, RECURRENT 02/11/2008  . HYPERLIPIDEMIA 12/04/2007  . DEPRESSION 12/04/2007  . ASTHMA 12/04/2007  . LUNG NODULE 12/04/2007  . GERD 12/04/2007  . DIVERTICULOSIS, COLON 12/04/2007  . COLONIC POLYPS, HX OF 12/04/2007  . ERECTILE DYSFUNCTION 04/09/2007  . SLEEP APNEA, OBSTRUCTIVE, MODERATE 04/09/2007  . ALLERGIC RHINITIS 04/09/2007  . BENIGN PROSTATIC HYPERTROPHY 04/09/2007  . LOW BACK PAIN 04/09/2007  . OBESITY 03/28/2007  . HYPERTENSION 03/28/2007  . Osteoarth NOS-Unspec 03/28/2007  . Degeneration of cervical intervertebral disc 03/28/2007  . TB SKIN TEST, POSITIVE 03/28/2007   Past Medical History  Diagnosis Date  . TB SKIN TEST, POSITIVE 03/28/2007  . SLEEP APNEA, OBSTRUCTIVE, MODERATE 04/09/2007  . RENAL  INSUFFICIENCY 03/15/2010  . PVD WITH CLAUDICATION 02/10/2010  . Polymyalgia rheumatica ([place]) 07/26/2008  . POLYARTHRALGIA 07/13/2008  . Other dysphagia 11/21/2009  . Osteoarth NOS-Unspec 03/28/2007  . LUNG NODULE 12/04/2007  . LOW BACK PAIN 04/09/2007  . LEG PAIN, BILATERAL 01/25/2010  . HYPERTENSION 03/28/2007  . HYPERLIPIDEMIA 12/04/2007  . HEMORRHOIDS, RECURRENT 02/11/2008  . GERD  12/04/2007  . ESOPHAGEAL STRICTURE 05/26/2008  . ERECTILE DYSFUNCTION 04/09/2007  . DYSPHAGIA UNSPECIFIED 03/16/2008  . DIVERTICULOSIS, COLON 12/04/2007  . DEPRESSION 12/04/2007  . Degeneration of cervical intervertebral disc 03/28/2007  . COLONIC POLYPS, HX OF 12/04/2007  . CONSTIPATION 09/25/2010  . CEREBROVASCULAR ACCIDENT, HX OF 07/17/2010  . BENIGN PROSTATIC HYPERTROPHY 04/09/2007  . BACK PAIN 09/16/2009  . ASTHMA 12/04/2007  . ALLERGIC RHINITIS 04/09/2007   Past Surgical History  Procedure Laterality Date  . Total knee arthroplasty      x 2  . Rotator cuff repair      Left   Allergies  Allergen Reactions  . Ace Inhibitors Swelling    Angioedema throat  . Doxycycline     Some GI upset  . Sulfa Antibiotics Hives    And facial angioedema   Prior to Admission medications   Medication Sig Start Date End Date Taking? Authorizing Provider  allopurinol (ZYLOPRIM) 100 MG tablet take 1 tablet by mouth once daily 12/21/14  Yes Biagio Borg, MD  atorvastatin (LIPITOR) 40 MG tablet take 1 tablet by mouth once daily 10/28/14  Yes Biagio Borg, MD  BYSTOLIC 10 MG tablet take 2 tablets by mouth once daily 06/16/15  Yes Biagio Borg, MD  furosemide (LASIX) 40 MG tablet Take 40 mg by mouth daily.   Yes Historical Provider, MD  naphazoline-pheniramine (NAPHCON-A) 0.025-0.3 % ophthalmic solution Place 1 drop into both eyes 4 (four) times daily as needed for irritation.   Yes Historical Provider, MD  naproxen (NAPROSYN) 375 MG tablet Take 1 tablet (375 mg total) by mouth 2 (two) times daily with a meal. 03/21/14  Yes Junius Creamer, NP  tamsulosin [facility]) 0.4 MG CAPS capsule take 1 capsule by mouth once daily 03/16/15  Yes Biagio Borg, MD  tiZANidine (ZANAFLEX) 4 MG tablet Take 1 tablet (4 mg total) by mouth every 6 (six) hours as needed for muscle spasms. 12/22/13  Yes Biagio Borg, MD  amoxicillin-clavulanate (AUGMENTIN) 875-125 MG tablet Take 1 tablet by mouth 2 (two) times daily. Patient not taking: Reported  on 11/13/2015 10/19/15   Robyn Haber, MD  colchicine 0.6 MG tablet 1 tab by mouth twice per day as needed Patient not taking: Reported on 10/19/2015 10/15/13   Biagio Borg, MD  furosemide (LASIX) 40 MG tablet take 1 tablet by mouth once daily Patient not taking: Reported on 10/19/2015 04/19/15   Biagio Borg, MD  HYDROcodone-homatropine [facility]) 5-1.5 MG/5ML syrup Take 5 mLs by mouth every 8 (eight) hours as needed for cough. Patient not taking: Reported on 11/13/2015 10/19/15   Robyn Haber, MD  traMADol (ULTRAM) 50 MG tablet Take 1 tablet (50 mg total) by mouth every 8 (eight) hours as needed. Patient not taking: Reported on 10/19/2015 01/19/14   Biagio Borg, MD   Social History   Social History  . Marital Status: Legally Separated    Spouse Name: N/A  . Number of Children: 6  . Years of Education: N/A   Occupational History  . light Engineer, maintenance    Social History Main Topics  . Smoking status: Former Research scientist (life sciences)  .  Smokeless tobacco: Never Used  . Alcohol Use: No  . Drug Use: No  . Sexual Activity: Not on file   Other Topics Concern  . Not on file   Social History Narrative   Patient does not get regular exercise.   6 children - 1 died with suicide, 1 with homicide        Review of Systems  Constitutional: Negative for fever, chills and diaphoresis.  Respiratory: Negative for cough, choking, chest tightness and shortness of breath.   Cardiovascular: Positive for chest pain.  Gastrointestinal: Positive for nausea (with chest pain. ) and abdominal pain (upper stomach - nausea feeling - notices after eating for months. no new sx's. ).  Genitourinary: Negative for dysuria, hematuria and difficulty urinating (hx of BPH, but urinating normally. ).  Musculoskeletal: Positive for myalgias, back pain, arthralgias and neck pain.  Skin: Negative for rash.       Objective:   Physical Exam  Constitutional: He is oriented to person, place, and time. He appears well-developed and  well-nourished.  HENT:  Head: Normocephalic and atraumatic.  Mouth/Throat: No oropharyngeal exudate.  Eyes: EOM are normal. Pupils are equal, round, and reactive to light.  Neck: Neck supple. No JVD present. Carotid bruit is not present.  Cardiovascular: Normal rate, regular rhythm and normal heart sounds.   No murmur heard.  Chest wall nontender, unable to reproduce pain with palpation.  Pulmonary/Chest: Effort normal and breath sounds normal. He has no rales.  Abdominal: Soft. Bowel sounds are normal. He exhibits no distension. There is no tenderness. There is no guarding.  Musculoskeletal:  Tender to palpation diffusely across the upper shoulders, paraspinal muscles in the neck, paraspinal muscles of the lower lumbar spine. No midline bony tenderness. Slightly decreased range of motion of cervical spine, equal bilaterally.   Neurological: He is alert and oriented to person, place, and time.  Skin: Skin is warm and dry.  Psychiatric: He has a normal mood and affect. His behavior is normal.  Vitals reviewed.  Filed Vitals:   11/13/15 1105  BP: 130/70  Pulse: 60  Temp: 97.8 F (36.6 C)  TempSrc: Oral  Resp: 16  Height: 5\' 9"  (1.753 m)  Weight: 291 lb (131.997 kg)  SpO2: 98%    Results for orders placed or performed in visit on 11/13/15  POCT Influenza A/B  Result Value Ref Range   Influenza A, POC Negative Negative   Influenza B, POC Negative Negative  POCT CBC  Result Value Ref Range   WBC 4.9 4.6 - 10.2 K/uL   Lymph, poc 1.9 0.6 - 3.4   POC LYMPH PERCENT 39.5 10 - 50 %L   MID (cbc) 0.5 0 - 0.9   POC MID % 9.7 0 - 12 %M   POC Granulocyte 2.5 2 - 6.9   Granulocyte percent 50.8 37 - 80 %G   RBC 5.07 4.69 - 6.13 M/uL   Hemoglobin 14.3 14.1 - 18.1 g/dL   HCT, POC 41.7 (A) 43.5 - 53.7 %   MCV 82.1 80 - 97 fL   MCH, POC 28.3 27 - 31.2 pg   MCHC 34.4 31.8 - 35.4 g/dL   RDW, POC 14.1 %   Platelet Count, POC 207 142 - 424 K/uL   MPV 7.6 0 - 99.8 fL    EKG sinus  bradycardia, rate 47. Diffuse ST elevation, suspected repolarization variant. Seen on previous EKGs as well, without apparent significant changes.   At times in the past has experienced lightheadedness  with walking, but this improves fairly quickly. Denies any lightheadedness or dizziness with walking today. Denies any current chest pain today. Ambulatory heart rate 52-72 with 2 laps. No lightheadedness.     Assessment & Plan:  RAMIR KALLAY is a 70 y.o. male Myalgia - Plan: POCT Influenza A/B Pain of both shoulder joints Neck pain Cough - Plan: POCT Influenza A/B, DG Chest 2 View Nonintractable headache, unspecified chronicity pattern, unspecified headache type Malaise and fatigue - Plan: POCT Influenza A/B, POCT CBC, Basic metabolic panel  - Possible viral illness, likely flu with no fever and negative flu test.  Symptomatic care with Tylenol, rest, fluids, RTC precautions.  Chest pain, unspecified chest pain type - Plan: EKG 12-Lead, DG Chest 2 View  -2 episodes yesterday, asymptomatic today. Diffuse ST elevation noted on EKG, but this appears to be present previously, and may be more early repolarization. Differential includes pericarditis with diffuse ST changes, but again this is been present before, and no chest pain today. Only 2 episodes yesterday. Cardiac risk factors of age, hyperlipidemia, hypertension. EKG discussed with cardiologist on call.  -Refer for outpatient cardiology evaluation and stress test in next few days. Advised patient if any recurrence of chest pain, go to emergency room or call 911. Understanding expressed.  Chronic kidney disease, unspecified stage - Plan: Basic metabolic panel  -Stable by report approximately one month ago. Will recheck BMP with current fatigue.  Bradycardia  - Chronic per patient. Appropriately increases with ambulation. Discuss with cardiology as well. RTC/ER precautions.  No orders of the defined types were placed in this encounter.     Patient Instructions       IF you received an x-ray today, you will receive an invoice from [facility]. Please contact [facility]iology at [phone] with questions or concerns regarding your invoice.   IF you received labwork today, you will receive an invoice from [employer]. Please contact [facility] at [phone] with questions or concerns regarding your invoice.   Our billing staff will not be able to assist you with questions regarding bills from these companies.  You will be contacted with the lab results as soon as they are available. The fastest way to get your results is to activate your My Chart account. Instructions are located on the last page of this paperwork. If you have not heard from [place] regarding the results in 2 weeks, please contact this office.     Your body aches, and fatigue may be related to a viral illness. However for the episodes of chest pain yesterday, and your abnormal EKG, you do need to see a cardiologist and follow-up fairly quickly. I will place a referral for you to see them in the next few days. The EKG appears overall similar to EKGs in the past, but it is recommended that you have stress testing fairly soon. If any further episodes of chest pain, go directly to the emergency room or call 911 for further evaluation.  Okay to take Tylenol for the muscle aches. If you are not already taking aspirin once a day, I do recommend a baby aspirin (81 mg, once per day)  until evaluated by cardiology.    Return to the clinic or go to the nearest emergency room if any of your symptoms worsen or new symptoms occur.  Nonspecific Chest Pain  Chest pain can be caused by many different conditions. There is always a chance that your pain could be related to something serious, such as  a heart attack or a blood clot in your lungs. Chest pain can also be caused by conditions that are not life-threatening. If you have chest pain, it  is very important to follow up with your health care provider. CAUSES  Chest pain can be caused by:  Heartburn.  Pneumonia or bronchitis.  Anxiety or stress.  Inflammation around your heart (pericarditis) or lung (pleuritis or pleurisy).  A blood clot in your lung.  A collapsed lung (pneumothorax). It can develop suddenly on its own (spontaneous pneumothorax) or from trauma to the chest.  Shingles infection (varicella-zoster virus).  Heart attack.  Damage to the bones, muscles, and cartilage that make up your chest wall. This can include:  Bruised bones due to injury.  Strained muscles or cartilage due to frequent or repeated coughing or overwork.  Fracture to one or more ribs.  Sore cartilage due to inflammation (costochondritis). RISK FACTORS  Risk factors for chest pain may include:  Activities that increase your risk for trauma or injury to your chest.  Respiratory infections or conditions that cause frequent coughing.  Medical conditions or overeating that can cause heartburn.  Heart disease or family history of heart disease.  Conditions or health behaviors that increase your risk of developing a blood clot.  Having had chicken pox (varicella zoster). SIGNS AND SYMPTOMS Chest pain can feel like:  Burning or tingling on the surface of your chest or deep in your chest.  Crushing, pressure, aching, or squeezing pain.  Dull or sharp pain that is worse when you move, cough, or take a deep breath.  Pain that is also felt in your back, neck, shoulder, or arm, or pain that spreads to any of these areas. Your chest pain may come and go, or it may stay constant. DIAGNOSIS Lab tests or other studies may be needed to find the cause of your pain. Your health care provider may have you take a test called an ambulatory ECG (electrocardiogram). An ECG records your heartbeat patterns at the time the test is performed. You may also have other tests, such  as:  Transthoracic echocardiogram (TTE). During echocardiography, sound waves are used to create a picture of all of the heart structures and to look at how blood flows through your heart.  Transesophageal echocardiogram (TEE).This is a more advanced imaging test that obtains images from inside your body. It allows your health care provider to see your heart in finer detail.  Cardiac monitoring. This allows your health care provider to monitor your heart rate and rhythm in real time.  Holter monitor. This is a portable device that records your heartbeat and can help to diagnose abnormal heartbeats. It allows your health care provider to track your heart activity for several days, if needed.  Stress tests. These can be done through exercise or by taking medicine that makes your heart beat more quickly.  Blood tests.  Imaging tests. TREATMENT  Your treatment depends on what is causing your chest pain. Treatment may include:  Medicines. These may include:  Acid blockers for heartburn.  Anti-inflammatory medicine.  Pain medicine for inflammatory conditions.  Antibiotic medicine, if an infection is present.  Medicines to dissolve blood clots.  Medicines to treat coronary artery disease.  Supportive care for conditions that do not require medicines. This may include:  Resting.  Applying heat or cold packs to injured areas.  Limiting activities until pain decreases. HOME CARE INSTRUCTIONS  If you were prescribed an antibiotic medicine, finish it all even if  you start to feel better.  Avoid any activities that bring on chest pain.  Do not use any tobacco products, including cigarettes, chewing tobacco, or electronic cigarettes. If you need help quitting, ask your health care provider.  Do not drink alcohol.  Take medicines only as directed by your health care provider.  Keep all follow-up visits as directed by your health care provider. This is important. This includes any  further testing if your chest pain does not go away.  If heartburn is the cause for your chest pain, you may be told to keep your head raised (elevated) while sleeping. This reduces the chance that acid will go from your stomach into your esophagus.  Make lifestyle changes as directed by your health care provider. These may include:  Getting regular exercise. Ask your health care provider to suggest some activities that are safe for you.  Eating a heart-healthy diet. A registered dietitian can help you to learn healthy eating options.  Maintaining a healthy weight.  Managing diabetes, if necessary.  Reducing stress. SEEK MEDICAL CARE IF:  Your chest pain does not go away after treatment.  You have a rash with blisters on your chest.  You have a fever. SEEK IMMEDIATE MEDICAL CARE IF:   Your chest pain is worse.  You have an increasing cough, or you cough up blood.  You have severe abdominal pain.  You have severe weakness.  You faint.  You have chills.  You have sudden, unexplained chest discomfort.  You have sudden, unexplained discomfort in your arms, back, neck, or jaw.  You have shortness of breath at any time.  You suddenly start to sweat, or your skin gets clammy.  You feel nauseous or you vomit.  You suddenly feel light-headed or dizzy.  Your heart begins to beat quickly, or it feels like it is skipping beats. These symptoms may represent a serious problem that is an emergency. Do not wait to see if the symptoms will go away. Get medical help right away. Call your local emergency services (911 in the [place] Do not drive yourself to the hospital.   This information is not intended to replace advice given to you by your health care provider. Make sure you discuss any questions you have with your health care provider.   Document Released: 05/09/2005 Document Revised: 08/20/2014 Document Reviewed: 03/05/2014 Elsevier Interactive Patient Education 2016 [employer]. Fatigue Fatigue is feeling tired all of the time, a lack of energy, or a lack of motivation. Occasional or mild fatigue is often a normal response to activity or life in general. However, long-lasting (chronic) or extreme fatigue may indicate an underlying medical condition. HOME CARE INSTRUCTIONS  Watch your fatigue for any changes. The following actions may help to lessen any discomfort you are feeling:  Talk to your health care provider about how much sleep you need each night. Try to get the required amount every night.  Take medicines only as directed by your health care provider.  Eat a healthy and nutritious diet. Ask your health care provider if you need help changing your diet.  Drink enough fluid to keep your urine clear or pale yellow.  Practice ways of relaxing, such as yoga, meditation, massage therapy, or acupuncture.  Exercise regularly.   Change situations that cause you stress. Try to keep your work and personal routine reasonable.  Do not abuse illegal drugs.  Limit alcohol intake to no more than 1 drink per day for nonpregnant women and 2  drinks per day for men. One drink equals 12 ounces of beer, 5 ounces of wine, or 1 ounces of hard liquor.  Take a multivitamin, if directed by your health care provider. SEEK MEDICAL CARE IF:   Your fatigue does not get better.  You have a fever.   You have unintentional weight loss or gain.  You have headaches.   You have difficulty:   Falling asleep.  Sleeping throughout the night.  You feel angry, guilty, anxious, or sad.   You are unable to have a bowel movement (constipation).   You skin is dry.   Your legs or another part of your body is swollen.  SEEK IMMEDIATE MEDICAL CARE IF:   You feel confused.   Your vision is blurry.  You feel faint or pass out.   You have a severe headache.   You have severe abdominal, pelvic, or back pain.   You have chest pain, shortness of breath, or  an irregular or fast heartbeat.   You are unable to urinate or you urinate less than normal.   You develop abnormal bleeding, such as bleeding from the rectum, vagina, nose, lungs, or nipples.  You vomit blood.   You have thoughts about harming yourself or committing suicide.   You are worried that you might harm someone else.    This information is not intended to replace advice given to you by your health care provider. Make sure you discuss any questions you have with your health care provider.   Document Released: 05/27/2007 Document Revised: 08/20/2014 Document Reviewed: 12/01/2013 Elsevier Interactive Patient Education 2016 Else[employer] Inc[employer]  Bradycardia Bradycardia is a slower-than-normal heart rate. A normal resting heart rate for an adult ranges from 60 to 100 beats per minute. With bradycardia, the resting heart rate is less than 60 beats per minute. Bradycardia is a problem if your heart cannot pump enough oxygen-rich blood through your body. Bradycardia is not a problem for everyone. For some healthy adults, a slow resting heart rate is normal.  CAUSES  Bradycardia may be caused by:  A problem with the heart's electrical system, such as heart block.  A problem with the heart's natural pacemaker (sinus node).  Heart disease, damage, or infection.  Certain medicines that treat heart conditions.  Certain conditions, such as hypothyroidism and obstructive sleep apnea. RISK FACTORS  Risk factors include:  Being 56 or older.  Having high blood pressure (hypertension), high cholesterol (hyperlipidemia), or diabetes.  Drinking heavily, using tobacco products, or using drugs.  Being stressed. SIGNS AND SYMPTOMS  Signs and symptoms include:  Light-headedness.  Faintingor near fainting.  Fatigue and weakness.  Shortness of breath.  Chest pain (angina).  Drowsiness.  Confusion.  Dizziness. DIAGNOSIS  Diagnosis of bradycardia may include:  A physical  exam.  An electrocardiogram (ECG).  Blood tests. TREATMENT  Treatment for bradycardia may include:  Treatment of an underlying condition.  Pacemaker placement. A pacemaker is a small, battery-powered device that is placed under the skin and is programmed to sense your heartbeats. If your heart rate is lower than the programmed rate, the pacemaker will pace your heart.  Changing your medicines or dosages. HOME CARE INSTRUCTIONS  Take medicines only as directed by your health care provider.  Manage any health conditions that contribute to bradycardia as directed by your health care provider.  Follow a heart-healthy diet. A dietitian can help educate you on healthy food options and changes.  Follow an exercise program approved by your health care  provider.  Maintain a healthy weight. Lose weight as approved by your health care provider.  Do not use tobacco products, including cigarettes, chewing tobacco, or electronic cigarettes. If you need help quitting, ask your health care provider.  Do not use illegal drugs.  Limit alcohol intake to no more than 1 drink per day for nonpregnant women and 2 drinks per day for men. One drink equals 12 ounces of beer, 5 ounces of wine, or 1 ounces of hard liquor.  Keep all follow-up visits as directed by your health care provider. This is important. SEEK MEDICAL CARE IF:  You feel light-headed or dizzy.  You almost faint.  You feel weak or are easily fatigued during physical activity.  You experience confusion or have memory problems. SEEK IMMEDIATE MEDICAL CARE IF:   You faint.  You have an irregular heartbeat.  You have chest pain.  You have trouble breathing. MAKE SURE YOU:   Understand these instructions.  Will watch your condition.  Will get help right away if you are not doing well or get worse.   This information is not intended to replace advice given to you by your health care provider. Make sure you discuss any  questions you have with your health care provider.   Document Released: 04/21/2002 Document Revised: 08/20/2014 Document Reviewed: 11/04/2013 Elsevier Interactive Patient Education [employer].

## 2015-11-13 NOTE — Patient Instructions (Addendum)
IF you received an x-ray today, you will receive an invoice from [facility]. Please contact [facility] at [phone] with questions or concerns regarding your invoice.   IF you received labwork today, you will receive an invoice from [employer]. Please contact [facility] at [phone] with questions or concerns regarding your invoice.   Our billing staff will not be able to assist you with questions regarding bills from these companies.  You will be contacted with the lab results as soon as they are available. The fastest way to get your results is to activate your My Chart account. Instructions are located on the last page of this paperwork. If you have not heard from [place] regarding the results in 2 weeks, please contact this office.     Your body aches, and fatigue may be related to a viral illness. However for the episodes of chest pain yesterday, and your abnormal EKG, you do need to see a cardiologist and follow-up fairly quickly. I will place a referral for you to see them in the next few days. The EKG appears overall similar to EKGs in the past, but it is recommended that you have stress testing fairly soon. If any further episodes of chest pain, go directly to the emergency room or call 911 for further evaluation.  Okay to take Tylenol for the muscle aches. If you are not already taking aspirin once a day, I do recommend a baby aspirin (81 mg, once per day)  until evaluated by cardiology.    Return to the clinic or go to the nearest emergency room if any of your symptoms worsen or new symptoms occur.  Nonspecific Chest Pain  Chest pain can be caused by many different conditions. There is always a chance that your pain could be related to something serious, such as a heart attack or a blood clot in your lungs. Chest pain can also be caused by conditions that are not life-threatening. If you have chest pain, it is very important to follow  up with your health care provider. CAUSES  Chest pain can be caused by:  Heartburn.  Pneumonia or bronchitis.  Anxiety or stress.  Inflammation around your heart (pericarditis) or lung (pleuritis or pleurisy).  A blood clot in your lung.  A collapsed lung (pneumothorax). It can develop suddenly on its own (spontaneous pneumothorax) or from trauma to the chest.  Shingles infection (varicella-zoster virus).  Heart attack.  Damage to the bones, muscles, and cartilage that make up your chest wall. This can include:  Bruised bones due to injury.  Strained muscles or cartilage due to frequent or repeated coughing or overwork.  Fracture to one or more ribs.  Sore cartilage due to inflammation (costochondritis). RISK FACTORS  Risk factors for chest pain may include:  Activities that increase your risk for trauma or injury to your chest.  Respiratory infections or conditions that cause frequent coughing.  Medical conditions or overeating that can cause heartburn.  Heart disease or family history of heart disease.  Conditions or health behaviors that increase your risk of developing a blood clot.  Having had chicken pox (varicella zoster). SIGNS AND SYMPTOMS Chest pain can feel like:  Burning or tingling on the surface of your chest or deep in your chest.  Crushing, pressure, aching, or squeezing pain.  Dull or sharp pain that is worse when you move, cough, or take a deep breath.  Pain that is also felt in your back, neck, shoulder,  or arm, or pain that spreads to any of these areas. Your chest pain may come and go, or it may stay constant. DIAGNOSIS Lab tests or other studies may be needed to find the cause of your pain. Your health care provider may have you take a test called an ambulatory ECG (electrocardiogram). An ECG records your heartbeat patterns at the time the test is performed. You may also have other tests, such as:  Transthoracic echocardiogram (TTE).  During echocardiography, sound waves are used to create a picture of all of the heart structures and to look at how blood flows through your heart.  Transesophageal echocardiogram (TEE).This is a more advanced imaging test that obtains images from inside your body. It allows your health care provider to see your heart in finer detail.  Cardiac monitoring. This allows your health care provider to monitor your heart rate and rhythm in real time.  Holter monitor. This is a portable device that records your heartbeat and can help to diagnose abnormal heartbeats. It allows your health care provider to track your heart activity for several days, if needed.  Stress tests. These can be done through exercise or by taking medicine that makes your heart beat more quickly.  Blood tests.  Imaging tests. TREATMENT  Your treatment depends on what is causing your chest pain. Treatment may include:  Medicines. These may include:  Acid blockers for heartburn.  Anti-inflammatory medicine.  Pain medicine for inflammatory conditions.  Antibiotic medicine, if an infection is present.  Medicines to dissolve blood clots.  Medicines to treat coronary artery disease.  Supportive care for conditions that do not require medicines. This may include:  Resting.  Applying heat or cold packs to injured areas.  Limiting activities until pain decreases. HOME CARE INSTRUCTIONS  If you were prescribed an antibiotic medicine, finish it all even if you start to feel better.  Avoid any activities that bring on chest pain.  Do not use any tobacco products, including cigarettes, chewing tobacco, or electronic cigarettes. If you need help quitting, ask your health care provider.  Do not drink alcohol.  Take medicines only as directed by your health care provider.  Keep all follow-up visits as directed by your health care provider. This is important. This includes any further testing if your chest pain does  not go away.  If heartburn is the cause for your chest pain, you may be told to keep your head raised (elevated) while sleeping. This reduces the chance that acid will go from your stomach into your esophagus.  Make lifestyle changes as directed by your health care provider. These may include:  Getting regular exercise. Ask your health care provider to suggest some activities that are safe for you.  Eating a heart-healthy diet. A registered dietitian can help you to learn healthy eating options.  Maintaining a healthy weight.  Managing diabetes, if necessary.  Reducing stress. SEEK MEDICAL CARE IF:  Your chest pain does not go away after treatment.  You have a rash with blisters on your chest.  You have a fever. SEEK IMMEDIATE MEDICAL CARE IF:   Your chest pain is worse.  You have an increasing cough, or you cough up blood.  You have severe abdominal pain.  You have severe weakness.  You faint.  You have chills.  You have sudden, unexplained chest discomfort.  You have sudden, unexplained discomfort in your arms, back, neck, or jaw.  You have shortness of breath at any time.  You suddenly start  to sweat, or your skin gets clammy.  You feel nauseous or you vomit.  You suddenly feel light-headed or dizzy.  Your heart begins to beat quickly, or it feels like it is skipping beats. These symptoms may represent a serious problem that is an emergency. Do not wait to see if the symptoms will go away. Get medical help right away. Call your local emergency services (911 in the [place] Do not drive yourself to the hospital.   This information is not intended to replace advice given to you by your health care provider. Make sure you discuss any questions you have with your health care provider.   Document Released: 05/09/2005 Document Revised: 08/20/2014 Document Reviewed: 03/05/2014 Elsevier Interactive Patient Education 2016 [employer]. Fatigue Fatigue is feeling tired  all of the time, a lack of energy, or a lack of motivation. Occasional or mild fatigue is often a normal response to activity or life in general. However, long-lasting (chronic) or extreme fatigue may indicate an underlying medical condition. HOME CARE INSTRUCTIONS  Watch your fatigue for any changes. The following actions may help to lessen any discomfort you are feeling:  Talk to your health care provider about how much sleep you need each night. Try to get the required amount every night.  Take medicines only as directed by your health care provider.  Eat a healthy and nutritious diet. Ask your health care provider if you need help changing your diet.  Drink enough fluid to keep your urine clear or pale yellow.  Practice ways of relaxing, such as yoga, meditation, massage therapy, or acupuncture.  Exercise regularly.   Change situations that cause you stress. Try to keep your work and personal routine reasonable.  Do not abuse illegal drugs.  Limit alcohol intake to no more than 1 drink per day for nonpregnant women and 2 drinks per day for men. One drink equals 12 ounces of beer, 5 ounces of wine, or 1 ounces of hard liquor.  Take a multivitamin, if directed by your health care provider. SEEK MEDICAL CARE IF:   Your fatigue does not get better.  You have a fever.   You have unintentional weight loss or gain.  You have headaches.   You have difficulty:   Falling asleep.  Sleeping throughout the night.  You feel angry, guilty, anxious, or sad.   You are unable to have a bowel movement (constipation).   You skin is dry.   Your legs or another part of your body is swollen.  SEEK IMMEDIATE MEDICAL CARE IF:   You feel confused.   Your vision is blurry.  You feel faint or pass out.   You have a severe headache.   You have severe abdominal, pelvic, or back pain.   You have chest pain, shortness of breath, or an irregular or fast heartbeat.   You  are unable to urinate or you urinate less than normal.   You develop abnormal bleeding, such as bleeding from the rectum, vagina, nose, lungs, or nipples.  You vomit blood.   You have thoughts about harming yourself or committing suicide.   You are worried that you might harm someone else.    This information is not intended to replace advice given to you by your health care provider. Make sure you discuss any questions you have with your health care provider.   Document Released: 05/27/2007 Document Revised: 08/20/2014 Document Reviewed: 12/01/2013 Elsevier Interactive Patient Education 2016 Else[employer] Inc[employer]  Bradycardia Bradycardia is a slower-than-normal  heart rate. A normal resting heart rate for an adult ranges from 60 to 100 beats per minute. With bradycardia, the resting heart rate is less than 60 beats per minute. Bradycardia is a problem if your heart cannot pump enough oxygen-rich blood through your body. Bradycardia is not a problem for everyone. For some healthy adults, a slow resting heart rate is normal.  CAUSES  Bradycardia may be caused by:  A problem with the heart's electrical system, such as heart block.  A problem with the heart's natural pacemaker (sinus node).  Heart disease, damage, or infection.  Certain medicines that treat heart conditions.  Certain conditions, such as hypothyroidism and obstructive sleep apnea. RISK FACTORS  Risk factors include:  Being 19 or older.  Having high blood pressure (hypertension), high cholesterol (hyperlipidemia), or diabetes.  Drinking heavily, using tobacco products, or using drugs.  Being stressed. SIGNS AND SYMPTOMS  Signs and symptoms include:  Light-headedness.  Faintingor near fainting.  Fatigue and weakness.  Shortness of breath.  Chest pain (angina).  Drowsiness.  Confusion.  Dizziness. DIAGNOSIS  Diagnosis of bradycardia may include:  A physical exam.  An electrocardiogram  (ECG).  Blood tests. TREATMENT  Treatment for bradycardia may include:  Treatment of an underlying condition.  Pacemaker placement. A pacemaker is a small, battery-powered device that is placed under the skin and is programmed to sense your heartbeats. If your heart rate is lower than the programmed rate, the pacemaker will pace your heart.  Changing your medicines or dosages. HOME CARE INSTRUCTIONS  Take medicines only as directed by your health care provider.  Manage any health conditions that contribute to bradycardia as directed by your health care provider.  Follow a heart-healthy diet. A dietitian can help educate you on healthy food options and changes.  Follow an exercise program approved by your health care provider.  Maintain a healthy weight. Lose weight as approved by your health care provider.  Do not use tobacco products, including cigarettes, chewing tobacco, or electronic cigarettes. If you need help quitting, ask your health care provider.  Do not use illegal drugs.  Limit alcohol intake to no more than 1 drink per day for nonpregnant women and 2 drinks per day for men. One drink equals 12 ounces of beer, 5 ounces of wine, or 1 ounces of hard liquor.  Keep all follow-up visits as directed by your health care provider. This is important. SEEK MEDICAL CARE IF:  You feel light-headed or dizzy.  You almost faint.  You feel weak or are easily fatigued during physical activity.  You experience confusion or have memory problems. SEEK IMMEDIATE MEDICAL CARE IF:   You faint.  You have an irregular heartbeat.  You have chest pain.  You have trouble breathing. MAKE SURE YOU:   Understand these instructions.  Will watch your condition.  Will get help right away if you are not doing well or get worse.   This information is not intended to replace advice given to you by your health care provider. Make sure you discuss any questions you have with your health  care provider.   Document Released: 04/21/2002 Document Revised: 08/20/2014 Document Reviewed: 11/04/2013 Elsevier Interactive Patient Education [employer].

## 2015-11-16 ENCOUNTER — Encounter: Payer: Self-pay | Admitting: Internal Medicine

## 2015-11-16 ENCOUNTER — Ambulatory Visit (INDEPENDENT_AMBULATORY_CARE_PROVIDER_SITE_OTHER): Payer: PPO | Admitting: Internal Medicine

## 2015-11-16 ENCOUNTER — Other Ambulatory Visit (INDEPENDENT_AMBULATORY_CARE_PROVIDER_SITE_OTHER): Payer: PPO

## 2015-11-16 VITALS — BP 138/68 | HR 57 | Temp 98.4°F | Resp 20 | Wt 294.0 lb

## 2015-11-16 DIAGNOSIS — K219 Gastro-esophageal reflux disease without esophagitis: Secondary | ICD-10-CM

## 2015-11-16 DIAGNOSIS — I10 Essential (primary) hypertension: Secondary | ICD-10-CM

## 2015-11-16 DIAGNOSIS — R079 Chest pain, unspecified: Secondary | ICD-10-CM

## 2015-11-16 DIAGNOSIS — Z0000 Encounter for general adult medical examination without abnormal findings: Secondary | ICD-10-CM

## 2015-11-16 DIAGNOSIS — K222 Esophageal obstruction: Secondary | ICD-10-CM | POA: Diagnosis not present

## 2015-11-16 DIAGNOSIS — Z1159 Encounter for screening for other viral diseases: Secondary | ICD-10-CM | POA: Diagnosis not present

## 2015-11-16 DIAGNOSIS — Z0001 Encounter for general adult medical examination with abnormal findings: Secondary | ICD-10-CM

## 2015-11-16 DIAGNOSIS — R7302 Impaired glucose tolerance (oral): Secondary | ICD-10-CM

## 2015-11-16 DIAGNOSIS — R6889 Other general symptoms and signs: Secondary | ICD-10-CM

## 2015-11-16 DIAGNOSIS — N183 Chronic kidney disease, stage 3 (moderate): Secondary | ICD-10-CM | POA: Diagnosis not present

## 2015-11-16 LAB — CBC WITH DIFFERENTIAL/PLATELET
BASOS PCT: 0.5 % (ref 0.0–3.0)
Basophils Absolute: 0 10*3/uL (ref 0.0–0.1)
EOS ABS: 0.2 10*3/uL (ref 0.0–0.7)
Eosinophils Relative: 4.2 % (ref 0.0–5.0)
HEMATOCRIT: 40 % (ref 39.0–52.0)
HEMOGLOBIN: 13.2 g/dL (ref 13.0–17.0)
LYMPHS PCT: 37.3 % (ref 12.0–46.0)
Lymphs Abs: 1.9 10*3/uL (ref 0.7–4.0)
MCHC: 33.1 g/dL (ref 30.0–36.0)
MCV: 82.5 fl (ref 78.0–100.0)
MONOS PCT: 12.8 % — AB (ref 3.0–12.0)
Monocytes Absolute: 0.7 10*3/uL (ref 0.1–1.0)
NEUTROS ABS: 2.4 10*3/uL (ref 1.4–7.7)
Neutrophils Relative %: 45.2 % (ref 43.0–77.0)
PLATELETS: 223 10*3/uL (ref 150.0–400.0)
RBC: 4.85 Mil/uL (ref 4.22–5.81)
RDW: 14.4 % (ref 11.5–15.5)
WBC: 5.2 10*3/uL (ref 4.0–10.5)

## 2015-11-16 LAB — BASIC METABOLIC PANEL
BUN: 27 mg/dL — AB (ref 6–23)
CALCIUM: 9.2 mg/dL (ref 8.4–10.5)
CO2: 28 mEq/L (ref 19–32)
CREATININE: 1.8 mg/dL — AB (ref 0.40–1.50)
Chloride: 109 mEq/L (ref 96–112)
GFR: 48.3 mL/min — AB (ref >60.00)
Glucose, Bld: 102 mg/dL — ABNORMAL HIGH (ref 70–99)
Potassium: 4.3 mEq/L (ref 3.5–5.1)
Sodium: 142 mEq/L (ref 135–145)

## 2015-11-16 LAB — URINALYSIS, ROUTINE W REFLEX MICROSCOPIC
BILIRUBIN URINE: NEGATIVE
Hgb urine dipstick: NEGATIVE
KETONES UR: NEGATIVE
LEUKOCYTES UA: NEGATIVE
Nitrite: NEGATIVE
PH: 5.5 (ref 5.0–8.0)
RBC / HPF: NONE SEEN (ref 0–?)
SPECIFIC GRAVITY, URINE: 1.01 (ref 1.000–1.030)
TOTAL PROTEIN, URINE-UPE24: NEGATIVE
UROBILINOGEN UA: 0.2 (ref 0.0–1.0)
Urine Glucose: NEGATIVE

## 2015-11-16 LAB — TSH: TSH: 1.25 u[IU]/mL (ref 0.35–4.50)

## 2015-11-16 LAB — LIPID PANEL
CHOL/HDL RATIO: 3
Cholesterol: 156 mg/dL (ref 0–200)
HDL: 45 mg/dL (ref >39.00)
LDL CALC: 94 mg/dL (ref 0–99)
NONHDL: 110.67
Triglycerides: 81 mg/dL (ref 0.0–149.0)
VLDL: 16.2 mg/dL (ref 0.0–40.0)

## 2015-11-16 LAB — HEPATIC FUNCTION PANEL
ALT: 15 U/L (ref 0–53)
AST: 17 U/L (ref 0–37)
Albumin: 3.6 g/dL (ref 3.5–5.2)
Alkaline Phosphatase: 99 U/L (ref 39–117)
BILIRUBIN DIRECT: 0.1 mg/dL (ref 0.0–0.3)
BILIRUBIN TOTAL: 0.5 mg/dL (ref 0.2–1.2)
Total Protein: 6.8 g/dL (ref 6.0–8.3)

## 2015-11-16 LAB — PSA: PSA: 1.13 ng/mL (ref 0.10–4.00)

## 2015-11-16 LAB — HEMOGLOBIN A1C: HEMOGLOBIN A1C: 5.8 % (ref 4.6–6.5)

## 2015-11-16 LAB — HEPATITIS C ANTIBODY: HCV AB: NEGATIVE

## 2015-11-16 MED ORDER — PANTOPRAZOLE SODIUM 40 MG PO TBEC: 40.0000 mg | DELAYED_RELEASE_TABLET | Freq: Every day | ORAL | Status: DC

## 2015-11-16 NOTE — Progress Notes (Signed)
Pre visit review using our clinic review tool, if applicable. No additional management support is needed unless otherwise documented below in the visit note. 

## 2015-11-16 NOTE — Assessment & Plan Note (Signed)

## 2015-11-16 NOTE — Assessment & Plan Note (Signed)
Ok for start PPI for uncontrolled symptoms, protonix 40 qd, f/u GI

## 2015-11-16 NOTE — Assessment & Plan Note (Signed)
stable overall by history and exam, recent data reviewed with pt, and pt to continue medical treatment as before,  to f/u any worsening symptoms or concerns BP Readings from Last 3 Encounters:  11/16/15 138/68  11/13/15 130/70  10/19/15 140/70

## 2015-11-16 NOTE — Assessment & Plan Note (Addendum)
?   Mild worsening recently, dc nsaids, for renal US, refer nephrology  In addition to the time spent performing CPE, I spent an additional 40 minutes face to face,in which greater than 50% of this time was spent in counseling and coordination of care for patient's acute illness as documented.

## 2015-11-16 NOTE — Assessment & Plan Note (Signed)
With ongoing diffuculty swallowing recent, for PPI, refer GI - may need repeat EGD

## 2015-11-16 NOTE — Assessment & Plan Note (Signed)
Atypical, suspect possibly GI but cant r/o cardiac with mult risk factors, for stress test later this week

## 2015-11-16 NOTE — Patient Instructions (Addendum)
.  Please take all new medication as prescribed - the protonix 40 mg per day for the stomach  Please STOP the naproxyn (alleve) as this can hurt your kidneys and stomach  You can also take tylenol as needed for pain.  Please continue all other medications as before, and refills have been done if requested.  Please have the pharmacy call with any other refills you may need.  Please continue your efforts at being more active, low cholesterol diet, and weight control.  You are otherwise up to date with prevention measures today.  Please keep your appointments with your specialists as you may have planned, including your stress test later this week  You will be contacted regarding the referral for: kidney ultrasound, and nephrology (kidney doctor), as well as Gastroenterology (GI) for the swallowing and reflux  Please go to the LAB in the Basement (turn left off the elevator) for the tests to be done today  You will be contacted by phone if any changes need to be made immediately.  Otherwise, you will receive a letter about your results with an explanation, but please check with MyChart first.  Please remember to sign up for MyChart if you have not done so, as this will be important to you in the future with finding out test results, communicating by private email, and scheduling acute appointments online when needed.  Please return in 6 months, or sooner if needed

## 2015-11-16 NOTE — Assessment & Plan Note (Signed)
stable overall by history and exam, recent data reviewed with pt, and pt to continue medical treatment as before,  to f/u any worsening symptoms or concerns Lab Results  Component Value Date   HGBA1C 5.7 10/15/2013

## 2015-11-16 NOTE — Progress Notes (Signed)
Subjective:    Patient ID: Troy Palmer, male    DOB: 1945-10-11, 70 y.o.   MRN: [id number]  HPI  Here for wellness and f/u;  Overall doing ok;  Pt denies worsening SOB, DOE, wheezing, orthopnea, PND, worsening LE edema, palpitations, dizziness or syncope.  Pt denies neurological change such as new headache, facial or extremity weakness.  Pt denies polydipsia, polyuria, or low sugar symptoms. Pt states overall good compliance with treatment and medications, good tolerability, and has been trying to follow appropriate diet.  Pt denies worsening depressive symptoms, suicidal ideation or panic. No fever, night sweats, wt loss, loss of appetite, or other constitutional symptoms.  Pt states good ability with ADL's, has low fall risk, home safety reviewed and adequate, no other significant changes in hearing or vision, and only occasionally active with exercise. Did have episode atypical CP recently, seen in UC Apr 2, ECG no acute, scheduled for stress test for Friday apr 7. CXr neg for acute. Did have increased cr to 2.1 (previous 1.7) and advised to f/u.  Pt has been referred to a [facility] but was never actually see there.   Last renal US 2013 with left renal atrophy.  Pt requests new u/s, and referral.  Also has had mild worsening reflux with dysphagia midl to mod, but no abd pain, wt loss, n/v, bowel change or blood.  Last EGD 2012 with Dr Deatra Ina with stricture.  Cannot recall why PPI was stopped in past.  Takes nsaid fairly often several times per wk, with intermittent shoulder pains. Past Medical History  Diagnosis Date  . TB SKIN TEST, POSITIVE 03/28/2007  . SLEEP APNEA, OBSTRUCTIVE, MODERATE 04/09/2007  . RENAL INSUFFICIENCY 03/15/2010  . PVD WITH CLAUDICATION 02/10/2010  . Polymyalgia rheumatica ([place]) 07/26/2008  . POLYARTHRALGIA 07/13/2008  . Other dysphagia 11/21/2009  . Osteoarth NOS-Unspec 03/28/2007  . LUNG NODULE 12/04/2007  . LOW BACK PAIN 04/09/2007  . LEG PAIN, BILATERAL 01/25/2010  .  HYPERTENSION 03/28/2007  . HYPERLIPIDEMIA 12/04/2007  . HEMORRHOIDS, RECURRENT 02/11/2008  . GERD 12/04/2007  . ESOPHAGEAL STRICTURE 05/26/2008  . ERECTILE DYSFUNCTION 04/09/2007  . DYSPHAGIA UNSPECIFIED 03/16/2008  . DIVERTICULOSIS, COLON 12/04/2007  . DEPRESSION 12/04/2007  . Degeneration of cervical intervertebral disc 03/28/2007  . COLONIC POLYPS, HX OF 12/04/2007  . CONSTIPATION 09/25/2010  . CEREBROVASCULAR ACCIDENT, HX OF 07/17/2010  . BENIGN PROSTATIC HYPERTROPHY 04/09/2007  . BACK PAIN 09/16/2009  . ASTHMA 12/04/2007  . ALLERGIC RHINITIS 04/09/2007   Past Surgical History  Procedure Laterality Date  . Total knee arthroplasty      x 2  . Rotator cuff repair      Left    reports that he has quit smoking. He has never used smokeless tobacco. He reports that he does not drink alcohol or use illicit drugs. family history includes Asthma in his other; Heart disease in his father; Hypertension in his brother; Lupus in his brother. Allergies  Allergen Reactions  . Ace Inhibitors Swelling    Angioedema throat  . Doxycycline     Some GI upset  . Sulfa Antibiotics Hives    And facial angioedema   Current Outpatient Prescriptions on File Prior to Visit  Medication Sig Dispense Refill  . allopurinol (ZYLOPRIM) 100 MG tablet take 1 tablet by mouth once daily 90 tablet 3  . atorvastatin (LIPITOR) 40 MG tablet take 1 tablet by mouth once daily 90 tablet 2  . BYSTOLIC 10 MG tablet take 2 tablets by mouth once daily  60 tablet 0  . furosemide (LASIX) 40 MG tablet Take 40 mg by mouth daily.    . naphazoline-pheniramine (NAPHCON-A) 0.025-0.3 % ophthalmic solution Place 1 drop into both eyes 4 (four) times daily as needed for irritation.    . naproxen (NAPROSYN) 375 MG tablet Take 1 tablet (375 mg total) by mouth 2 (two) times daily with a meal. 30 tablet 0  . tamsulosin (FLOMAX) 0.4 MG CAPS capsule take 1 capsule by mouth once daily 90 capsule 0  . tiZANidine (ZANAFLEX) 4 MG tablet Take 1 tablet (4 mg  total) by mouth every 6 (six) hours as needed for muscle spasms. 60 tablet 2   No current facility-administered medications on file prior to visit.    Review of Systems Constitutional: Negative for increased diaphoresis, or other activity, appetite or siginficant weight change other than noted HENT: Negative for worsening hearing loss, ear pain, facial swelling, mouth sores and neck stiffness.   Eyes: Negative for other worsening pain, redness or visual disturbance.  Respiratory: Negative for choking or stridor Cardiovascular: Negative for other chest pain and palpitations.  Gastrointestinal: Negative for worsening diarrhea, blood in stool, or abdominal distention Genitourinary: Negative for hematuria, flank pain or change in urine volume.  Musculoskeletal: Negative for myalgias or other joint complaints.  Skin: Negative for other color change and wound or drainage.  Neurological: Negative for syncope and numbness. other than noted Hematological: Negative for adenopathy. or other swelling Psychiatric/Behavioral: Negative for hallucinations, SI, self-injury, decreased concentration or other worsening agitation.      Objective:   Physical Exam BP 138/68 mmHg  Pulse 57  Temp(Src) 98.4 F (36.9 C) (Oral)  Resp 20  Wt 294 lb (133.358 kg)  SpO2 98% VS noted, not ill appearing Constitutional: Pt is oriented to person, place, and time. Appears well-developed and well-nourished, in no significant distress Head: Normocephalic and atraumatic  Eyes: Conjunctivae and EOM are normal. Pupils are equal, round, and reactive to light Right Ear: External ear normal.  Left Ear: External ear normal Nose: Nose normal.  Mouth/Throat: Oropharynx is clear and moist  Neck: Normal range of motion. Neck supple. No JVD present. No tracheal deviation present or significant neck LA or mass Cardiovascular: Normal rate, regular rhythm, normal heart sounds and intact distal pulses.   Pulmonary/Chest: Effort  normal and breath sounds without rales or wheezing  Abdominal: Soft. Bowel sounds are normal. NT. No HSM  except for mild epigastric tender Musculoskeletal: Normal range of motion. Exhibits no edema, no chest wall tender Lymphadenopathy: Has no cervical adenopathy.  Neurological: Pt is alert and oriented to person, place, and time. Pt has normal reflexes. No cranial nerve deficit. Motor grossly intact Skin: Skin is warm and dry. No rash noted or new ulcers Psychiatric:  Has normal mood and affect. Behavior is normal.     Assessment & Plan:

## 2015-11-23 ENCOUNTER — Ambulatory Visit (HOSPITAL_COMMUNITY): Admission: RE | Admit: 2015-11-23 | Payer: PPO | Source: Ambulatory Visit

## 2015-12-12 ENCOUNTER — Encounter: Payer: Self-pay | Admitting: Gastroenterology

## 2016-01-31 DIAGNOSIS — E669 Obesity, unspecified: Secondary | ICD-10-CM | POA: Diagnosis not present

## 2016-01-31 DIAGNOSIS — I10 Essential (primary) hypertension: Secondary | ICD-10-CM | POA: Diagnosis not present

## 2016-01-31 DIAGNOSIS — N183 Chronic kidney disease, stage 3 (moderate): Secondary | ICD-10-CM | POA: Diagnosis not present

## 2016-01-31 DIAGNOSIS — M109 Gout, unspecified: Secondary | ICD-10-CM | POA: Diagnosis not present

## 2016-02-07 ENCOUNTER — Telehealth: Payer: Self-pay | Admitting: Gastroenterology

## 2016-02-07 ENCOUNTER — Ambulatory Visit: Payer: PPO | Admitting: Gastroenterology

## 2016-02-07 NOTE — Telephone Encounter (Signed)
Patient had appointment that he previously cancelled. He states that he is feeling better and declined to schedule.

## 2016-02-07 NOTE — Telephone Encounter (Signed)
There are not any records to indicate the urgency or if he is actively having any issues. I have him scheduled with the first available appointment. Ask the patient if he will be willing to see Alonza Bogus, PA on 02/16/16 at 3:00 pm. She can evaluate, diagnose and begin treatment. That will also get him established with a primary GI again. Otherwise the appointments with the doctors are in August to September now.

## 2016-02-16 ENCOUNTER — Ambulatory Visit: Payer: PPO | Admitting: Gastroenterology

## 2016-04-27 ENCOUNTER — Other Ambulatory Visit: Payer: Self-pay | Admitting: Internal Medicine

## 2016-04-28 ENCOUNTER — Other Ambulatory Visit: Payer: Self-pay | Admitting: Internal Medicine

## 2016-05-17 ENCOUNTER — Ambulatory Visit (INDEPENDENT_AMBULATORY_CARE_PROVIDER_SITE_OTHER): Payer: PPO | Admitting: Internal Medicine

## 2016-05-17 ENCOUNTER — Encounter: Payer: Self-pay | Admitting: Internal Medicine

## 2016-05-17 VITALS — BP 140/80 | HR 59 | Temp 98.1°F | Resp 20 | Wt 306.5 lb

## 2016-05-17 DIAGNOSIS — I10 Essential (primary) hypertension: Secondary | ICD-10-CM | POA: Diagnosis not present

## 2016-05-17 DIAGNOSIS — N183 Chronic kidney disease, stage 3 unspecified: Secondary | ICD-10-CM

## 2016-05-17 DIAGNOSIS — R7302 Impaired glucose tolerance (oral): Secondary | ICD-10-CM | POA: Diagnosis not present

## 2016-05-17 DIAGNOSIS — E785 Hyperlipidemia, unspecified: Secondary | ICD-10-CM | POA: Diagnosis not present

## 2016-05-17 DIAGNOSIS — Z0001 Encounter for general adult medical examination with abnormal findings: Secondary | ICD-10-CM

## 2016-05-17 MED ORDER — FUROSEMIDE 40 MG PO TABS: ORAL_TABLET | ORAL | 11 refills | Status: DC

## 2016-05-17 NOTE — Assessment & Plan Note (Signed)
Suspect edema related to this, for lasix 40 qam, and 40 qpm prn swelling only, cont f/u renal as planned

## 2016-05-17 NOTE — Assessment & Plan Note (Signed)
stable overall by history and exam, recent data reviewed with pt, and pt to continue medical treatment as before,  to f/u any worsening symptoms or concerns. Lab Results  Component Value Date   LDLCALC 94 11/16/2015

## 2016-05-17 NOTE — Progress Notes (Signed)
Subjective:    Patient ID: Troy [facility], male    DOB: 06-Sep-1945, 70 y.o.   MRN: [id number]  HPI  Here to f/u; overall doing ok,  Pt denies chest pain, increasing sob or doe, wheezing, orthopnea, PND, palpitations, dizziness or syncope, but has had worsening bilat distal LE edema small, worse in the evening, better in the AM  Pt denies new neurological symptoms such as new headache, or facial or extremity weakness or numbness.  Pt denies polydipsia, polyuria, or low sugar episode.   Pt denies new neurological symptoms such as new headache, or facial or extremity weakness or numbness.   Pt states overall good compliance with meds, mostly trying to follow appropriate diet, with wt overall stable,  but little exercise however. Seeing renal every 6 mo with labs for CKD.  No hx of CHF  Due for flu shot Past Medical History:  Diagnosis Date  . ALLERGIC RHINITIS 04/09/2007  . ASTHMA 12/04/2007  . BACK PAIN 09/16/2009  . BENIGN PROSTATIC HYPERTROPHY 04/09/2007  . CEREBROVASCULAR ACCIDENT, HX OF 07/17/2010  . COLONIC POLYPS, HX OF 12/04/2007  . CONSTIPATION 09/25/2010  . Degeneration of cervical intervertebral disc 03/28/2007  . DEPRESSION 12/04/2007  . DIVERTICULOSIS, COLON 12/04/2007  . DYSPHAGIA UNSPECIFIED 03/16/2008  . ERECTILE DYSFUNCTION 04/09/2007  . ESOPHAGEAL STRICTURE 05/26/2008  . GERD 12/04/2007  . HEMORRHOIDS, RECURRENT 02/11/2008  . HYPERLIPIDEMIA 12/04/2007  . HYPERTENSION 03/28/2007  . LEG PAIN, BILATERAL 01/25/2010  . LOW BACK PAIN 04/09/2007  . LUNG NODULE 12/04/2007  . Osteoarth NOS-Unspec 03/28/2007  . Other dysphagia 11/21/2009  . POLYARTHRALGIA 07/13/2008  . Polymyalgia rheumatica ([place]) 07/26/2008  . PVD WITH CLAUDICATION 02/10/2010  . RENAL INSUFFICIENCY 03/15/2010  . SLEEP APNEA, OBSTRUCTIVE, MODERATE 04/09/2007  . TB SKIN TEST, POSITIVE 03/28/2007   Past Surgical History:  Procedure Laterality Date  . ROTATOR CUFF REPAIR     Left  . TOTAL KNEE ARTHROPLASTY     x 2    reports that  he has quit smoking. He has never used smokeless tobacco. He reports that he does not drink alcohol or use drugs. family history includes Asthma in his other; Heart disease in his father; Hypertension in his brother; Lupus in his brother. Allergies  Allergen Reactions  . Ace Inhibitors Swelling    Angioedema throat  . Doxycycline     Some GI upset  . Sulfa Antibiotics Hives    And facial angioedema   Current Outpatient Prescriptions on File Prior to Visit  Medication Sig Dispense Refill  . allopurinol (ZYLOPRIM) 100 MG tablet take 1 tablet by mouth once daily 90 tablet 3  . atorvastatin (LIPITOR) 40 MG tablet take 1 tablet by mouth once daily 90 tablet 2  . BYSTOLIC 10 MG tablet take 2 tablets by mouth once daily 60 tablet 5  . naphazoline-pheniramine (NAPHCON-A) 0.025-0.3 % ophthalmic solution Place 1 drop into both eyes 4 (four) times daily as needed for irritation.    . pantoprazole (PROTONIX) 40 MG tablet Take 1 tablet (40 mg total) by mouth daily. 90 tablet 3  . tamsulosin (FLOMAX) 0.4 MG CAPS capsule take 1 capsule by mouth once daily 90 capsule 0  . tiZANidine (ZANAFLEX) 4 MG tablet Take 1 tablet (4 mg total) by mouth every 6 (six) hours as needed for muscle spasms. 60 tablet 2   No current facility-administered medications on file prior to visit.    Review of Systems  Constitutional: Negative for unusual diaphoresis or night sweats HENT: Negative for  ear swelling or discharge Eyes: Negative for worsening visual haziness  Respiratory: Negative for choking and stridor.   Gastrointestinal: Negative for distension or worsening eructation Genitourinary: Negative for retention or change in urine volume.  Musculoskeletal: Negative for other MSK pain or swelling Skin: Negative for color change and worsening wound Neurological: Negative for tremors and numbness other than noted  Psychiatric/Behavioral: Negative for decreased concentration or agitation other than above         Objective:   Physical Exam BP 140/80   Pulse (!) 59   Temp 98.1 F (36.7 C) (Oral)   Resp 20   Wt (!) 306 lb 8 oz (139 kg)   SpO2 96%   BMI 45.26 kg/m  VS noted,  Constitutional: Pt appears in no apparent distress HENT: Head: NCAT.  Right Ear: External ear normal.  Left Ear: External ear normal.  Eyes: . Pupils are equal, round, and reactive to light. Conjunctivae and EOM are normal Neck: Normal range of motion. Neck supple.  Cardiovascular: Normal rate and regular rhythm.   Pulmonary/Chest: Effort normal and breath sounds without rales or wheezing.  Abd:  Soft, NT, ND, + BS Neurological: Pt is alert. Not confused , motor grossly intact Skin: Skin is warm. No rash, no LE edema Psychiatric: Pt behavior is normal. No agitation.      Assessment & Plan:

## 2016-05-17 NOTE — Progress Notes (Signed)
Pre visit review using our clinic review tool, if applicable. No additional management support is needed unless otherwise documented below in the visit note. 

## 2016-05-17 NOTE — Assessment & Plan Note (Signed)
stable overall by history and exam, recent data reviewed with pt, and pt to continue medical treatment as before,  to f/u any worsening symptoms or concerns BP Readings from Last 3 Encounters:  05/17/16 140/80  11/16/15 138/68  11/13/15 130/70

## 2016-05-17 NOTE — Patient Instructions (Signed)
OK to take the lasix at 40 mg every AM, but also 40 mg in the PM as needed for swelling only  Please continue all other medications as before, and refills have been done if requested.  Please have the pharmacy call with any other refills you may need.  Please continue your efforts at being more active, low cholesterol diet, and weight control.  You are otherwise up to date with prevention measures today.  Please keep your appointments with your specialists as you may have planned  We can hold on further lab tests today  Please return in 6 months, or sooner if needed, with Lab testing done 3-5 days before

## 2016-05-17 NOTE — Assessment & Plan Note (Signed)
stable overall by history and exam, recent data reviewed with pt, and pt to continue medical treatment as before,  to f/u any worsening symptoms or concerns Lab Results  Component Value Date   HGBA1C 5.8 11/16/2015

## 2016-05-22 ENCOUNTER — Other Ambulatory Visit: Payer: Self-pay | Admitting: Internal Medicine

## 2016-06-19 ENCOUNTER — Encounter: Payer: Self-pay | Admitting: Internal Medicine

## 2016-06-19 ENCOUNTER — Ambulatory Visit (INDEPENDENT_AMBULATORY_CARE_PROVIDER_SITE_OTHER): Payer: PPO | Admitting: Internal Medicine

## 2016-06-19 VITALS — BP 136/72 | HR 71 | Temp 98.1°F | Resp 20 | Wt 300.0 lb

## 2016-06-19 DIAGNOSIS — M25511 Pain in right shoulder: Secondary | ICD-10-CM | POA: Diagnosis not present

## 2016-06-19 DIAGNOSIS — I10 Essential (primary) hypertension: Secondary | ICD-10-CM | POA: Diagnosis not present

## 2016-06-19 MED ORDER — TRAMADOL HCL 50 MG PO TABS: 50.0000 mg | ORAL_TABLET | Freq: Three times a day (TID) | ORAL | 1 refills | Status: DC | PRN

## 2016-06-19 NOTE — Progress Notes (Signed)
Pre visit review using our clinic review tool, if applicable. No additional management support is needed unless otherwise documented below in the visit note. 

## 2016-06-19 NOTE — Progress Notes (Signed)
Subjective:    Patient ID: Troy Palmer, male    DOB: 19-May-1946, 70 y.o.   MRN: [id number]  HPI  Here to f/u with 1 wk acute onset right shoudler pain and sudden inability to bring the arm up more than a small way, cannot comb hair or reach overhead; no overt trauma, just seemed to happen, similar to previous left shoudler rot cuff tear.  Pt denies chest pain, increased sob or doe, wheezing, orthopnea, PND, increased LE swelling, palpitations, dizziness or syncope.   Pt denies polydipsia, polyuria. Pt denies new neurological symptoms such as new headache, or facial or extremity weakness or numbness  Has renal f/u in Dec per pt  No other ne complaints Past Medical History:  Diagnosis Date  . ALLERGIC RHINITIS 04/09/2007  . ASTHMA 12/04/2007  . BACK PAIN 09/16/2009  . BENIGN PROSTATIC HYPERTROPHY 04/09/2007  . CEREBROVASCULAR ACCIDENT, HX OF 07/17/2010  . COLONIC POLYPS, HX OF 12/04/2007  . CONSTIPATION 09/25/2010  . Degeneration of cervical intervertebral disc 03/28/2007  . DEPRESSION 12/04/2007  . DIVERTICULOSIS, COLON 12/04/2007  . DYSPHAGIA UNSPECIFIED 03/16/2008  . ERECTILE DYSFUNCTION 04/09/2007  . ESOPHAGEAL STRICTURE 05/26/2008  . GERD 12/04/2007  . HEMORRHOIDS, RECURRENT 02/11/2008  . HYPERLIPIDEMIA 12/04/2007  . HYPERTENSION 03/28/2007  . LEG PAIN, BILATERAL 01/25/2010  . LOW BACK PAIN 04/09/2007  . LUNG NODULE 12/04/2007  . Osteoarth NOS-Unspec 03/28/2007  . Other dysphagia 11/21/2009  . POLYARTHRALGIA 07/13/2008  . Polymyalgia rheumatica ([place]) 07/26/2008  . PVD WITH CLAUDICATION 02/10/2010  . RENAL INSUFFICIENCY 03/15/2010  . SLEEP APNEA, OBSTRUCTIVE, MODERATE 04/09/2007  . TB SKIN TEST, POSITIVE 03/28/2007   Past Surgical History:  Procedure Laterality Date  . ROTATOR CUFF REPAIR     Left  . TOTAL KNEE ARTHROPLASTY     x 2    reports that he has quit smoking. He has never used smokeless tobacco. He reports that he does not drink alcohol or use drugs. family history includes Asthma in  his other; Heart disease in his father; Hypertension in his brother; Lupus in his brother. Allergies  Allergen Reactions  . Ace Inhibitors Swelling    Angioedema throat  . Doxycycline     Some GI upset  . Sulfa Antibiotics Hives    And facial angioedema   Current Outpatient Prescriptions on File Prior to Visit  Medication Sig Dispense Refill  . allopurinol (ZYLOPRIM) 100 MG tablet take 1 tablet by mouth once daily 90 tablet 3  . amLODipine (NORVASC) 5 MG tablet take 1 tablet by mouth once daily 30 tablet 5  . atorvastatin (LIPITOR) 40 MG tablet take 1 tablet by mouth once daily 90 tablet 2  . BYSTOLIC 10 MG tablet take 2 tablets by mouth once daily 60 tablet 5  . furosemide (LASIX) 40 MG tablet 1 tab by mouth in the AM daily, with 1 by mouth in the PM as needed for swelling only 60 tablet 11  . naphazoline-pheniramine (NAPHCON-A) 0.025-0.3 % ophthalmic solution Place 1 drop into both eyes 4 (four) times daily as needed for irritation.    . pantoprazole (PROTONIX) 40 MG tablet Take 1 tablet (40 mg total) by mouth daily. 90 tablet 3  . tamsulosin (FLOMAX) 0.4 MG CAPS capsule take 1 capsule by mouth once daily 90 capsule 0  . tiZANidine (ZANAFLEX) 4 MG tablet Take 1 tablet (4 mg total) by mouth every 6 (six) hours as needed for muscle spasms. 60 tablet 2   No current facility-administered medications on file prior  to visit.     Review of Systems .All otherwise neg per pt    Objective:   Physical Exam BP 136/72   Pulse 71   Temp 98.1 F (36.7 C) (Oral)   Resp 20   Wt 300 lb (136.1 kg)   SpO2 93%   BMI 44.30 kg/m  VS noted,  Constitutional: Pt appears in no apparent distress HENT: Head: NCAT.  Right Ear: External ear normal.  Left Ear: External ear normal.  Eyes: . Pupils are equal, round, and reactive to light. Conjunctivae and EOM are normal Neck: Normal range of motion. Neck supple.  Cardiovascular: Normal rate and regular rhythm.   Pulmonary/Chest: Effort normal and  breath sounds without rales or wheezing.  Right shoulder with diffuse mild tender, forward elev and abduction to 15 degrees only with marked pain and discomfort Neurological: Pt is alert. Not confused , motor grossly intact Skin: Skin is warm. No rash, no LE edema Psychiatric: Pt behavior is normal. No agitation.     Assessment & Plan:

## 2016-06-19 NOTE — Patient Instructions (Signed)
Please take all new medication as prescribed - the pain medication  You will be contacted regarding the referral for: Dr Tamala Julian - (or you can  Make an appt with him before leaving today)  Please continue all other medications as before, and refills have been done if requested.  Please have the pharmacy call with any other refills you may need.  Please keep your appointments with your specialists as you may have planned

## 2016-06-24 NOTE — Assessment & Plan Note (Signed)
stable overall by history and exam, recent data reviewed with pt, and pt to continue medical treatment as before,  to f/u any worsening symptoms or concerns BP Readings from Last 3 Encounters:  06/19/16 136/72  05/17/16 140/80  11/16/15 138/68

## 2016-06-24 NOTE — Assessment & Plan Note (Signed)
Exam c/w probable right rot cuff tear, for tramadol prn, refer Dr Smith/sport med in this office,  to f/u any worsening symptoms or concerns

## 2016-06-25 NOTE — Progress Notes (Signed)
[facility] [address], [place] [address] Phone: [phone] Subjective:    I'm seeing this patient by the request  of:  Cathlean Cower, MD   CC: right shoulder pain  [id number]  Troy Palmer is a 70 y.o. male coming in with complaint of right shoulder pain. Patient states that this started approximately 2 weeks ago. Sudden inability to raise his arm. States that can't combing his hair and reaching overhand causes more pain. Does not remember any injury. Patient has Patient denies any significant radiation down the arm. Weakness noted. Rates the severity of pain is8 out of 10. Maybe some association with a flu shot but really was giving him trouble before then.      Past Medical History:  Diagnosis Date  . ALLERGIC RHINITIS 04/09/2007  . ASTHMA 12/04/2007  . BACK PAIN 09/16/2009  . BENIGN PROSTATIC HYPERTROPHY 04/09/2007  . CEREBROVASCULAR ACCIDENT, HX OF 07/17/2010  . COLONIC POLYPS, HX OF 12/04/2007  . CONSTIPATION 09/25/2010  . Degeneration of cervical intervertebral disc 03/28/2007  . DEPRESSION 12/04/2007  . DIVERTICULOSIS, COLON 12/04/2007  . DYSPHAGIA UNSPECIFIED 03/16/2008  . ERECTILE DYSFUNCTION 04/09/2007  . ESOPHAGEAL STRICTURE 05/26/2008  . GERD 12/04/2007  . HEMORRHOIDS, RECURRENT 02/11/2008  . HYPERLIPIDEMIA 12/04/2007  . HYPERTENSION 03/28/2007  . LEG PAIN, BILATERAL 01/25/2010  . LOW BACK PAIN 04/09/2007  . LUNG NODULE 12/04/2007  . Osteoarth NOS-Unspec 03/28/2007  . Other dysphagia 11/21/2009  . POLYARTHRALGIA 07/13/2008  . Polymyalgia rheumatica ([place]) 07/26/2008  . PVD WITH CLAUDICATION 02/10/2010  . RENAL INSUFFICIENCY 03/15/2010  . SLEEP APNEA, OBSTRUCTIVE, MODERATE 04/09/2007  . TB SKIN TEST, POSITIVE 03/28/2007   Past Surgical History:  Procedure Laterality Date  . ROTATOR CUFF REPAIR     Left  . TOTAL KNEE ARTHROPLASTY     x 2   Social History   Social History  . Marital status: Widowed    Spouse name: N/A  . Number of  children: 6  . Years of education: N/A   Occupational History  . light Engineer, maintenance    Social History Main Topics  . Smoking status: Former Research scientist (life sciences)  . Smokeless tobacco: Never Used  . Alcohol use No  . Drug use: No  . Sexual activity: Not Asked   Other Topics Concern  . None   Social History Narrative   Patient does not get regular exercise.   6 children - 1 died with suicide, 1 with homicide   Allergies  Allergen Reactions  . Ace Inhibitors Swelling    Angioedema throat  . Doxycycline     Some GI upset  . Sulfa Antibiotics Hives    And facial angioedema   Family History  Problem Relation Age of Onset  . Heart disease Father   . Lupus Brother   . Hypertension Brother   . Asthma Other     Past medical history, social, surgical and family history all reviewed in electronic medical record.  No pertanent information unless stated regarding to the chief complaint.   Review of Systems:Review of systems updated and as accurate as of 06/26/16  No headache, visual changes, nausea, vomiting, diarrhea, constipation, dizziness, abdominal pain, skin rash, fevers, chills, night sweats, weight loss, swollen lymph nodes, body aches, joint swelling, muscle aches, chest pain, shortness of breath, mood changes.   Objective  Blood pressure (!) 142/80, pulse (!) 52, height 5\' 9"  (1.753 m), weight 296 lb 3.2 oz (134.4 kg). Systems examined below as of 06/26/16  General: No apparent distress alert and oriented x3 mood and affect normal, dressed appropriately.  HEENT: Pupils equal, extraocular movements intact  Respiratory: Patient's speak in full sentences and does not appear short of breath  Cardiovascular: No lower extremity edema, non tender, no erythema  Skin: Warm dry intact with no signs of infection or rash on extremities or on axial skeleton.  Abdomen: Soft nontender  Neuro: Cranial nerves II through XII are intact, neurovascularly intact in all extremities with 2+ DTRs  and 2+ pulses.  Lymph: No lymphadenopathy of posterior or anterior cervical chain or axillae bilaterally.  Gait normal with good balance and coordination.  MSK:  Non tender with full range of motion and good stability and symmetric strength and tone of  elbows, wrist, hip, knee and ankles bilaterally.  Shoulder: Right Inspection reveals no abnormalities, atrophy or asymmetry. Severe diffuse pain worse over lateral deltoid. Mild inflammation and warmth to the skin.  Active ROM if FF of 25 degrees, lateral of 40 degrees, external ROM of 10 degrees, internal ROM to lateral hip  Rotator cuff strength 2/5 compared to contralateral side.  signs of impingement with positive Neer and Hawkin's tests, but negative empty can sign. Speeds and Yergason's tests positive. No labral pathology noted with negative Obrien's, negative clunk and good stability. Normal scapular function observed. No painful arc and no drop arm sign. No apprehension sign Contralateral shoulder unremarkable.   MSK US performed of: Right This study was ordered, performed, and interpreted by Charlann Boxer D.O.  Shoulder:   Supraspinatus:full thickness tear noted. Retraction noted. Infraspinatus:  Appears normal on long and transverse views. Significant increase in Doppler flow Subscapularis:  Full tear noted but no retraction.  Teres Minor:  Appears normal on long and transverse views. AC joint:  Capsule undistended, no geyser sign. Glenohumeral Joint:  Mild OA Glenoid Labrum:  Intact without visualized tears. Biceps Tendon:  Appears normal on long and transverse views, no fraying of tendon, tendon located in intertubercular groove, no subluxation with shoulder internal or external rotation. Deltoid does have increase hypoechoic changes in subcutaneous material consistent with cellulitis.   Impression rotator cuff tear with likely deltoid cellulitis.      Impression and Recommendations:     This case required medical  decision making of moderate complexity.      Note: This dictation was prepared with Dragon dictation along with smaller phrase technology. Any transcriptional errors that result from this process are unintentional.

## 2016-06-26 ENCOUNTER — Ambulatory Visit (INDEPENDENT_AMBULATORY_CARE_PROVIDER_SITE_OTHER)
Admission: RE | Admit: 2016-06-26 | Discharge: 2016-06-26 | Disposition: A | Discharge status: Home / Self Care | Payer: PPO | Source: Ambulatory Visit | Attending: Family Medicine | Admitting: Family Medicine

## 2016-06-26 ENCOUNTER — Ambulatory Visit (INDEPENDENT_AMBULATORY_CARE_PROVIDER_SITE_OTHER): Payer: PPO | Admitting: Family Medicine

## 2016-06-26 ENCOUNTER — Ambulatory Visit: Payer: Self-pay

## 2016-06-26 ENCOUNTER — Encounter: Payer: Self-pay | Admitting: Family Medicine

## 2016-06-26 VITALS — BP 142/80 | HR 52 | Ht 69.0 in | Wt 296.2 lb

## 2016-06-26 DIAGNOSIS — L03119 Cellulitis of unspecified part of limb: Secondary | ICD-10-CM | POA: Diagnosis not present

## 2016-06-26 DIAGNOSIS — M25511 Pain in right shoulder: Secondary | ICD-10-CM

## 2016-06-26 DIAGNOSIS — M75121 Complete rotator cuff tear or rupture of right shoulder, not specified as traumatic: Secondary | ICD-10-CM | POA: Diagnosis not present

## 2016-06-26 MED ORDER — AMOXICILLIN-POT CLAVULANATE 875-125 MG PO TABS: 1.0000 | ORAL_TABLET | Freq: Two times a day (BID) | ORAL | 0 refills | Status: DC

## 2016-06-26 NOTE — Patient Instructions (Addendum)
Good to see you.  Ice 20 minutes 2 times daily. Usually after activity and before bed. pennsaid pinkie amount topically 2 times daily as needed.  We will get xray today  MRI is ordered to further evaluate but likely will need sugery Will give antibiotic incase the small infection is playing a role.  I will talk to you after the MRI

## 2016-06-26 NOTE — Assessment & Plan Note (Signed)
Will start abx to be safe, likely no giving the whole amount of pain.  Discussed labs which patient declined.

## 2016-06-26 NOTE — Assessment & Plan Note (Signed)
Appears complete secondary to patient having significant weakness. Differential includes frozen shoulder. Ultrasound would not be perfect secondary to patient's body habitus so may not be a appropriate imaging. I do feel that an MRI is necessary. Patient will be fitted for a open MRI. We discussed which activities doing which was to avoid. We will see what imaging shows and likely will refer to surgery

## 2016-07-10 ENCOUNTER — Ambulatory Visit
Admission: RE | Admit: 2016-07-10 | Discharge: 2016-07-10 | Disposition: A | Discharge status: Home / Self Care | Payer: PPO | Source: Ambulatory Visit | Attending: Family Medicine | Admitting: Family Medicine

## 2016-07-10 DIAGNOSIS — M25511 Pain in right shoulder: Secondary | ICD-10-CM | POA: Diagnosis not present

## 2016-07-13 ENCOUNTER — Other Ambulatory Visit: Payer: Self-pay | Admitting: Family Medicine

## 2016-07-13 ENCOUNTER — Other Ambulatory Visit: Payer: Self-pay | Admitting: *Deleted

## 2016-07-13 DIAGNOSIS — M75121 Complete rotator cuff tear or rupture of right shoulder, not specified as traumatic: Secondary | ICD-10-CM

## 2016-07-13 MED ORDER — TRAMADOL HCL 50 MG PO TABS: 50.0000 mg | ORAL_TABLET | Freq: Three times a day (TID) | ORAL | 1 refills | Status: DC | PRN

## 2016-07-13 NOTE — Progress Notes (Signed)
Tramadol refill  For rotator cuff, refer to [facility].

## 2016-07-17 ENCOUNTER — Telehealth: Payer: Self-pay | Admitting: Family Medicine

## 2016-07-17 NOTE — Telephone Encounter (Signed)
Is requesting call back in regard to surgeon referral.

## 2016-07-17 NOTE — Telephone Encounter (Signed)
Spoke to pt, made him aware referral faxed faxed to [facility] they will be contacting him to schedule an appt.

## 2016-07-24 DIAGNOSIS — I10 Essential (primary) hypertension: Secondary | ICD-10-CM | POA: Diagnosis not present

## 2016-07-24 DIAGNOSIS — E669 Obesity, unspecified: Secondary | ICD-10-CM | POA: Diagnosis not present

## 2016-07-24 DIAGNOSIS — M109 Gout, unspecified: Secondary | ICD-10-CM | POA: Diagnosis not present

## 2016-07-24 DIAGNOSIS — Z6841 Body Mass Index (BMI) 40.0 and over, adult: Secondary | ICD-10-CM | POA: Diagnosis not present

## 2016-07-24 DIAGNOSIS — N183 Chronic kidney disease, stage 3 (moderate): Secondary | ICD-10-CM | POA: Diagnosis not present

## 2016-08-17 DIAGNOSIS — M75121 Complete rotator cuff tear or rupture of right shoulder, not specified as traumatic: Secondary | ICD-10-CM | POA: Diagnosis not present

## 2016-09-01 ENCOUNTER — Other Ambulatory Visit: Payer: Self-pay | Admitting: Internal Medicine

## 2016-09-04 DIAGNOSIS — M75121 Complete rotator cuff tear or rupture of right shoulder, not specified as traumatic: Secondary | ICD-10-CM | POA: Diagnosis not present

## 2016-09-04 DIAGNOSIS — M19011 Primary osteoarthritis, right shoulder: Secondary | ICD-10-CM | POA: Diagnosis not present

## 2016-09-04 DIAGNOSIS — G8918 Other acute postprocedural pain: Secondary | ICD-10-CM | POA: Diagnosis not present

## 2016-09-04 DIAGNOSIS — M7541 Impingement syndrome of right shoulder: Secondary | ICD-10-CM | POA: Diagnosis not present

## 2016-09-21 DIAGNOSIS — M19011 Primary osteoarthritis, right shoulder: Secondary | ICD-10-CM | POA: Diagnosis not present

## 2016-09-25 DIAGNOSIS — Z4789 Encounter for other orthopedic aftercare: Secondary | ICD-10-CM | POA: Diagnosis not present

## 2016-09-25 DIAGNOSIS — M75121 Complete rotator cuff tear or rupture of right shoulder, not specified as traumatic: Secondary | ICD-10-CM | POA: Diagnosis not present

## 2016-09-25 DIAGNOSIS — M25511 Pain in right shoulder: Secondary | ICD-10-CM | POA: Diagnosis not present

## 2016-10-18 ENCOUNTER — Other Ambulatory Visit: Payer: Self-pay | Admitting: Internal Medicine

## 2016-10-19 DIAGNOSIS — Z9889 Other specified postprocedural states: Secondary | ICD-10-CM | POA: Diagnosis not present

## 2016-10-23 ENCOUNTER — Other Ambulatory Visit: Payer: Self-pay | Admitting: Internal Medicine

## 2016-11-08 ENCOUNTER — Other Ambulatory Visit: Payer: Self-pay | Admitting: Internal Medicine

## 2016-11-09 ENCOUNTER — Other Ambulatory Visit: Payer: Self-pay | Admitting: Internal Medicine

## 2016-11-16 ENCOUNTER — Ambulatory Visit (INDEPENDENT_AMBULATORY_CARE_PROVIDER_SITE_OTHER): Payer: PPO | Admitting: Internal Medicine

## 2016-11-16 ENCOUNTER — Encounter: Payer: Self-pay | Admitting: Internal Medicine

## 2016-11-16 ENCOUNTER — Ambulatory Visit (INDEPENDENT_AMBULATORY_CARE_PROVIDER_SITE_OTHER)
Admission: RE | Admit: 2016-11-16 | Discharge: 2016-11-16 | Disposition: A | Discharge status: Home / Self Care | Payer: PPO | Source: Ambulatory Visit | Attending: Internal Medicine | Admitting: Internal Medicine

## 2016-11-16 VITALS — BP 132/84 | Temp 97.7°F | Ht 69.5 in | Wt 295.0 lb

## 2016-11-16 DIAGNOSIS — J309 Allergic rhinitis, unspecified: Secondary | ICD-10-CM | POA: Diagnosis not present

## 2016-11-16 DIAGNOSIS — I10 Essential (primary) hypertension: Secondary | ICD-10-CM | POA: Diagnosis not present

## 2016-11-16 DIAGNOSIS — R079 Chest pain, unspecified: Secondary | ICD-10-CM

## 2016-11-16 DIAGNOSIS — Z0001 Encounter for general adult medical examination with abnormal findings: Secondary | ICD-10-CM | POA: Diagnosis not present

## 2016-11-16 DIAGNOSIS — Z0000 Encounter for general adult medical examination without abnormal findings: Secondary | ICD-10-CM | POA: Diagnosis not present

## 2016-11-16 DIAGNOSIS — R0609 Other forms of dyspnea: Secondary | ICD-10-CM

## 2016-11-16 DIAGNOSIS — R7302 Impaired glucose tolerance (oral): Secondary | ICD-10-CM | POA: Diagnosis not present

## 2016-11-16 DIAGNOSIS — R0600 Dyspnea, unspecified: Secondary | ICD-10-CM

## 2016-11-16 MED ORDER — TRIAMCINOLONE ACETONIDE 55 MCG/ACT NA AERO: 2.0000 | INHALATION_SPRAY | Freq: Every day | NASAL | 12 refills | Status: DC

## 2016-11-16 NOTE — Patient Instructions (Signed)
Please take all new medication as prescribed - the nasacort for allergies  Please continue all other medications as before, and refills have been done if requested.  Please have the pharmacy call with any other refills you may need.  Please continue your efforts at being more active, low cholesterol diet, and weight control.  You are otherwise up to date with prevention measures today.  You will be contacted regarding the referral for: Echocardiogram, and Stress Test  Please keep your appointments with your specialists as you may have planned  Please go to the XRAY Department in the Basement (go straight as you get off the elevator) for the x-ray testing  You will be contacted by phone if any changes need to be made immediately.  Otherwise, you will receive a letter about your results with an explanation, but please check with MyChart first.  Please remember to sign up for MyChart if you have not done so, as this will be important to you in the future with finding out test results, communicating by private email, and scheduling acute appointments online when needed.  If you have Medicare related insurance (such as traditional Medicare, Blue [employer] or [employer], or similar), Please make an appointment at the [facility] with Sharee Pimple, the [employer], for your Wellness Visit in this office, which is a benefit with your insurance.  Please return in 1 year for your yearly visit, or sooner if needed, with Lab testing done 3-5 days before

## 2016-11-16 NOTE — Progress Notes (Signed)
Pre visit review using our clinic review tool, if applicable. No additional management support is needed unless otherwise documented below in the visit note. 

## 2016-11-16 NOTE — Progress Notes (Signed)
Subjective:    Patient ID: Troy Palmer, male    DOB: May 04, 1946, 71 y.o.   MRN: [id number]  HPI  Here for wellness and f/u;  Overall doing ok;  Pt denies neurological change such as new headache, facial or extremity weakness.  Pt denies polydipsia, polyuria, or low sugar symptoms. Pt states overall good compliance with treatment and medications, good tolerability, and has been trying to follow appropriate diet.  Pt denies worsening depressive symptoms, suicidal ideation or panic. No fever, night sweats, wt loss, loss of appetite, or other constitutional symptoms.  Pt states good ability with ADL's, has low fall risk, home safety reviewed and adequate, no other significant changes in hearing or vision, and only occasionally active with exercise.  No other changes to basic hx except below.  Sees renal on regular basis with stable renal fxn per pt Wt Readings from Last 3 Encounters:  11/16/16 295 lb (133.8 kg)  06/26/16 296 lb 3.2 oz (134.4 kg)  06/19/16 300 lb (136.1 kg)   BP Readings from Last 3 Encounters:  11/16/16 132/84  06/26/16 (!) 142/80  06/19/16 136/72  c/o dyspnea sometimes with sitting, going up some stairs, or even lying down, no recent wt gain, ongoing since early jan 2018;  Has intermittent CP SSCP, pressure like, seems better with belching, without radation, n/v, sob, palps or diziness.     Pt denies fever, wt loss, night sweats, loss of appetite, or other constitutional symptoms  Does have several wks ongoing nasal allergy symptoms with clearish congestion, itch and sneezing, without fever, pain, ST, cough, swelling or wheezing, has not tried otc meds due to cost.  Past Medical History:  Diagnosis Date  . ALLERGIC RHINITIS 04/09/2007  . ASTHMA 12/04/2007  . BACK PAIN 09/16/2009  . BENIGN PROSTATIC HYPERTROPHY 04/09/2007  . CEREBROVASCULAR ACCIDENT, HX OF 07/17/2010  . COLONIC POLYPS, HX OF 12/04/2007  . CONSTIPATION 09/25/2010  . Degeneration of cervical intervertebral disc  03/28/2007  . DEPRESSION 12/04/2007  . DIVERTICULOSIS, COLON 12/04/2007  . DYSPHAGIA UNSPECIFIED 03/16/2008  . ERECTILE DYSFUNCTION 04/09/2007  . ESOPHAGEAL STRICTURE 05/26/2008  . GERD 12/04/2007  . HEMORRHOIDS, RECURRENT 02/11/2008  . HYPERLIPIDEMIA 12/04/2007  . HYPERTENSION 03/28/2007  . LEG PAIN, BILATERAL 01/25/2010  . LOW BACK PAIN 04/09/2007  . LUNG NODULE 12/04/2007  . Osteoarth NOS-Unspec 03/28/2007  . Other dysphagia 11/21/2009  . POLYARTHRALGIA 07/13/2008  . Polymyalgia rheumatica ([place]) 07/26/2008  . PVD WITH CLAUDICATION 02/10/2010  . RENAL INSUFFICIENCY 03/15/2010  . SLEEP APNEA, OBSTRUCTIVE, MODERATE 04/09/2007  . TB SKIN TEST, POSITIVE 03/28/2007   Past Surgical History:  Procedure Laterality Date  . ROTATOR CUFF REPAIR     Left  . TOTAL KNEE ARTHROPLASTY     x 2    reports that he has quit smoking. He has never used smokeless tobacco. He reports that he does not drink alcohol or use drugs. family history includes Asthma in his other; Heart disease in his father; Hypertension in his brother; Lupus in his brother. Allergies  Allergen Reactions  . Ace Inhibitors Swelling    Angioedema throat  . Doxycycline     Some GI upset  . Sulfa Antibiotics Hives    And facial angioedema   Current Outpatient Prescriptions on File Prior to Visit  Medication Sig Dispense Refill  . allopurinol (ZYLOPRIM) 100 MG tablet take 1 tablet by mouth once daily as directed 90 tablet 0  . amLODipine (NORVASC) 5 MG tablet take 1 tablet by mouth once daily 30  tablet 5  . atorvastatin (LIPITOR) 40 MG tablet take 1 tablet by mouth once daily 90 tablet 2  . BYSTOLIC 10 MG tablet take 2 tablets by mouth once daily 60 tablet 5  . furosemide (LASIX) 40 MG tablet 1 tab by mouth in the AM daily, with 1 by mouth in the PM as needed for swelling only 60 tablet 11  . naphazoline-pheniramine (NAPHCON-A) 0.025-0.3 % ophthalmic solution Place 1 drop into both eyes 4 (four) times daily as needed for irritation.    .  pantoprazole (PROTONIX) 40 MG tablet take 1 tablet by mouth once daily 90 tablet 3  . tamsulosin (FLOMAX) 0.4 MG CAPS capsule take 1 capsule by mouth once daily 90 capsule 2  . tiZANidine (ZANAFLEX) 4 MG tablet Take 1 tablet (4 mg total) by mouth every 6 (six) hours as needed for muscle spasms. 60 tablet 2  . traMADol (ULTRAM) 50 MG tablet Take 1 tablet (50 mg total) by mouth every 8 (eight) hours as needed. 60 tablet 1   No current facility-administered medications on file prior to visit.    Review of Systems Constitutional: Negative for other unusual diaphoresis, sweats, appetite or weight changes HENT: Negative for other worsening hearing loss, ear pain, facial swelling, mouth sores or neck stiffness.   Eyes: Negative for other worsening pain, redness or other visual disturbance.  Respiratory: Negative for other stridor or swelling Cardiovascular: Negative for other palpitations or other chest pain  Gastrointestinal: Negative for worsening diarrhea or loose stools, blood in stool, distention or other pain Genitourinary: Negative for hematuria, flank pain or other change in urine volume.  Musculoskeletal: Negative for myalgias or other joint swelling.  Skin: Negative for other color change, or other wound or worsening drainage.  Neurological: Negative for other syncope or numbness. Hematological: Negative for other adenopathy or swelling Psychiatric/Behavioral: Negative for hallucinations, other worsening agitation, SI, self-injury, or new decreased concentration All other system neg per pt    Objective:   Physical Exam BP 132/84   Temp 97.7 F (36.5 C) (Oral)   Ht 5' 9.5" (1.765 m)   Wt 295 lb (133.8 kg)   BMI 42.94 kg/m  VS noted,  Constitutional: Pt is oriented to person, place, and time. Appears well-developed and well-nourished, in no significant distress and comfortable Head: Normocephalic and atraumatic  Bilat tm's with mild erythema.  Max sinus areas mild tender.  Pharynx  with mild erythema, no exudate Eyes: Conjunctivae and EOM are normal. Pupils are equal, round, and reactive to light Right Ear: External ear normal without discharge Left Ear: External ear normal without discharge Nose: Nose without discharge or deformity Bilat tm's with mild erythema.  Max sinus areas non tender.  Pharynx with mild erythema, no exudate Mouth/Throat: Oropharynx is without other ulcerations and moist  Neck: Normal range of motion. Neck supple. No JVD present. No tracheal deviation present or significant neck LA or mass Cardiovascular: Normal rate, regular rhythm, normal heart sounds and intact distal pulses.   Pulmonary/Chest: WOB normal and breath sounds without rales or wheezing  Abdominal: Soft. Bowel sounds are normal. NT. No HSM  Musculoskeletal: Normal range of motion. Exhibits no edema Lymphadenopathy: Has no other cervical adenopathy.  Neurological: Pt is alert and oriented to person, place, and time. Pt has normal reflexes. No cranial nerve deficit. Motor grossly intact, Gait intact Skin: Skin is warm and dry. No rash noted or new ulcerations Psychiatric:  Has normal mood and affect. Behavior is normal without agitation No other exam  findings  ECG I have personally interpreted Sinus  Bradycardia  - 56 - WNL    Assessment & Plan:

## 2016-11-18 NOTE — Assessment & Plan Note (Signed)
For nasacort asd,  to f/u any worsening symptoms or concerns °

## 2016-11-18 NOTE — Assessment & Plan Note (Signed)
stable overall by history and exam, recent data reviewed with pt, and pt to continue medical treatment as before,  to f/u any worsening symptoms or concerns BP Readings from Last 3 Encounters:  11/16/16 132/84  06/26/16 (!) 142/80  06/19/16 136/72

## 2016-11-18 NOTE — Assessment & Plan Note (Signed)

## 2016-11-18 NOTE — Assessment & Plan Note (Signed)
stable overall by history and exam, recent data reviewed with pt, and pt to continue medical treatment as before,  to f/u any worsening symptoms or concerns Lab Results  Component Value Date   HGBA1C 5.8 11/16/2015

## 2016-11-18 NOTE — Assessment & Plan Note (Signed)
Atypical, for stress test,  to f/u any worsening symptoms or concerns

## 2016-11-18 NOTE — Assessment & Plan Note (Addendum)
Etiology unclear , exam benign, for echo,  to f/u any worsening symptoms or concerns  In addition to the time spent performing CPE, I spent an additional 15 minutes face to face,in which greater than 50% of this time was spent in counseling and coordination of care for patient's illness as documented., including d/w pt regarding differential dx of dyspnea and CP, as well as dx and tx of allergies

## 2016-12-03 ENCOUNTER — Telehealth (HOSPITAL_COMMUNITY): Payer: Self-pay | Admitting: Internal Medicine

## 2016-12-03 NOTE — Telephone Encounter (Signed)
I called patient and spoke with him in regards to getting scheduled for both and echo and myoview. He voiced that he did not have a lot of money to have these done at this moment due to paying for other doctors appt. Patient did agree to having echo done at this time.

## 2016-12-04 NOTE — Telephone Encounter (Signed)
Ok with me to do what he can, thanks

## 2016-12-04 NOTE — Telephone Encounter (Signed)
FYI - please see previous note

## 2016-12-17 ENCOUNTER — Encounter (HOSPITAL_COMMUNITY): Payer: Self-pay | Admitting: Emergency Medicine

## 2016-12-17 ENCOUNTER — Telehealth: Payer: Self-pay | Admitting: Internal Medicine

## 2016-12-17 ENCOUNTER — Emergency Department (HOSPITAL_COMMUNITY)
Admission: EM | Admit: 2016-12-17 | Discharge: 2016-12-17 | Discharge status: Against Medical Advice | Payer: PPO | Attending: Emergency Medicine | Admitting: Emergency Medicine

## 2016-12-17 ENCOUNTER — Telehealth (HOSPITAL_COMMUNITY): Payer: Self-pay | Admitting: Internal Medicine

## 2016-12-17 ENCOUNTER — Emergency Department (HOSPITAL_COMMUNITY): Payer: PPO

## 2016-12-17 ENCOUNTER — Other Ambulatory Visit (HOSPITAL_COMMUNITY): Payer: PPO

## 2016-12-17 DIAGNOSIS — Z96659 Presence of unspecified artificial knee joint: Secondary | ICD-10-CM | POA: Insufficient documentation

## 2016-12-17 DIAGNOSIS — N189 Chronic kidney disease, unspecified: Secondary | ICD-10-CM | POA: Insufficient documentation

## 2016-12-17 DIAGNOSIS — I129 Hypertensive chronic kidney disease with stage 1 through stage 4 chronic kidney disease, or unspecified chronic kidney disease: Secondary | ICD-10-CM | POA: Diagnosis not present

## 2016-12-17 DIAGNOSIS — R072 Precordial pain: Secondary | ICD-10-CM | POA: Diagnosis not present

## 2016-12-17 DIAGNOSIS — R0789 Other chest pain: Secondary | ICD-10-CM | POA: Diagnosis not present

## 2016-12-17 DIAGNOSIS — Z79899 Other long term (current) drug therapy: Secondary | ICD-10-CM | POA: Insufficient documentation

## 2016-12-17 DIAGNOSIS — Z87891 Personal history of nicotine dependence: Secondary | ICD-10-CM | POA: Diagnosis not present

## 2016-12-17 DIAGNOSIS — R079 Chest pain, unspecified: Secondary | ICD-10-CM | POA: Diagnosis not present

## 2016-12-17 DIAGNOSIS — J45909 Unspecified asthma, uncomplicated: Secondary | ICD-10-CM | POA: Diagnosis not present

## 2016-12-17 LAB — I-STAT TROPONIN, ED: Troponin i, poc: 0.01 ng/mL (ref 0.00–0.08)

## 2016-12-17 NOTE — Telephone Encounter (Signed)
User: Troy Palmer Date/time: 12/05/2016 3:30 PM  Comment: 4/25 - pt can only afford one test and pt chose the echo. sent Dr. Jenny Reichmann note to let him know./lb  Context: [place] Schedule Orders/Appt Requests Outcome:   Phone number:  Phone Type:   Comm. type: Telephone Call type: Outgoing  Contact: [name] A Relation to patient: Self  Letter:      User: Troy Palmer Date/time: 11/21/2016 2:06 PM  Comment: 4/11 waiting on HTA Auth/lb  Context: [place] Schedule Orders/Appt Requests Outcome:   Phone number:  Phone Type:   Comm. type: Telephone Call type: Incoming  Contact: [name], [name] Relation to patient: Self  Letter:             4/25 - pt can only afford one test and pt chose the echo. sent Dr. Jenny Reichmann note to let him know./lb 4/23 HTA Auth# [id number] exp 7/10/lb 4/11 waiting on HTA Auth/lb

## 2016-12-17 NOTE — ED Provider Notes (Signed)
[facility] DEPT Provider Note   CSN: [id number] Arrival date & time: 12/17/16  0957     History   Chief Complaint Chief Complaint  Patient presents with  . Chest Pain    HPI Troy Palmer is a 71 y.o. male.  HPI Patient is a 71 year old male who presents to emergency department with intermittent chest discomfort with associated shortness of breath over the past month.  He also has some new lower extremity swelling.  No prior history of cardiac disease.  He saw his primary care physician who is ordered an outpatient stress Myoview but the patient reports that he does not have the money to pay the co-pay.  He understands the importance of this.  No prior history of heart catheterization.  He reports a negative stress test in 2012.  Denies fevers and chills.  No history DVT or pulmonary embolism.  No unilateral leg swelling.      Past Medical History:  Diagnosis Date  . ALLERGIC RHINITIS 04/09/2007  . ASTHMA 12/04/2007  . BACK PAIN 09/16/2009  . BENIGN PROSTATIC HYPERTROPHY 04/09/2007  . CEREBROVASCULAR ACCIDENT, HX OF 07/17/2010  . COLONIC POLYPS, HX OF 12/04/2007  . CONSTIPATION 09/25/2010  . Degeneration of cervical intervertebral disc 03/28/2007  . DEPRESSION 12/04/2007  . DIVERTICULOSIS, COLON 12/04/2007  . DYSPHAGIA UNSPECIFIED 03/16/2008  . ERECTILE DYSFUNCTION 04/09/2007  . ESOPHAGEAL STRICTURE 05/26/2008  . GERD 12/04/2007  . HEMORRHOIDS, RECURRENT 02/11/2008  . HYPERLIPIDEMIA 12/04/2007  . HYPERTENSION 03/28/2007  . LEG PAIN, BILATERAL 01/25/2010  . LOW BACK PAIN 04/09/2007  . LUNG NODULE 12/04/2007  . Osteoarth NOS-Unspec 03/28/2007  . Other dysphagia 11/21/2009  . POLYARTHRALGIA 07/13/2008  . Polymyalgia rheumatica ([place]) 07/26/2008  . PVD WITH CLAUDICATION 02/10/2010  . RENAL INSUFFICIENCY 03/15/2010  . SLEEP APNEA, OBSTRUCTIVE, MODERATE 04/09/2007  . TB SKIN TEST, POSITIVE 03/28/2007    Patient Active Problem List   Diagnosis Date Noted  . Dyspnea 11/16/2016  . Complete  tear of right rotator cuff 06/26/2016  . Cellulitis of deltoid region 06/26/2016  . Greater trochanteric bursitis of right hip 05/05/2014  . Right hip pain 05/04/2014  . Posterior neck pain 12/22/2013  . Orchitis, right 12/22/2013  . Paresthesia of both feet 12/22/2013  . Bilateral shoulder pain 09/16/2013  . Allergic angioedema 07/31/2012  . Impaired glucose tolerance 04/15/2012  . Chest pain 02/24/2012  . Foot pain, bilateral 02/20/2012  . Leg pain, bilateral 01/19/2012  . Edema 01/16/2012  . Dysphagia 11/21/2011  . Gout 06/07/2011  . CKD (chronic kidney disease) 06/07/2011  . Wellness examination 02/14/2011  . Encounter for long-term (current) use of other medications 11/08/2010  . CONSTIPATION 09/25/2010  . Memory loss 06/28/2010  . PVD (peripheral vascular disease) ([place]) 02/10/2010  . LEG PAIN, BILATERAL 01/25/2010  . BACK PAIN 09/16/2009  . Polymyalgia rheumatica ([place]) 07/26/2008  . POLYARTHRALGIA 07/13/2008  . ESOPHAGEAL STRICTURE 05/26/2008  . HEMORRHOIDS, RECURRENT 02/11/2008  . Hyperlipidemia 12/04/2007  . DEPRESSION 12/04/2007  . ASTHMA 12/04/2007  . LUNG NODULE 12/04/2007  . GERD 12/04/2007  . DIVERTICULOSIS, COLON 12/04/2007  . COLONIC POLYPS, HX OF 12/04/2007  . ERECTILE DYSFUNCTION 04/09/2007  . SLEEP APNEA, OBSTRUCTIVE, MODERATE 04/09/2007  . Allergic rhinitis 04/09/2007  . BENIGN PROSTATIC HYPERTROPHY 04/09/2007  . LOW BACK PAIN 04/09/2007  . OBESITY 03/28/2007  . Essential hypertension 03/28/2007  . Osteoarth NOS-Unspec 03/28/2007  . Degeneration of cervical intervertebral disc 03/28/2007  . TB SKIN TEST, POSITIVE 03/28/2007    Past Surgical History:  Procedure Laterality Date  .  ROTATOR CUFF REPAIR     Left  . TOTAL KNEE ARTHROPLASTY     x 2       Home Medications    Prior to Admission medications   Medication Sig Start Date End Date Taking? Authorizing Provider  allopurinol (ZYLOPRIM) 100 MG tablet take 1 tablet by mouth once daily as  directed 09/03/16  Yes Biagio Borg, MD  atorvastatin (LIPITOR) 40 MG tablet take 1 tablet by mouth once daily 10/28/14  Yes Biagio Borg, MD  BYSTOLIC 10 MG tablet take 2 tablets by mouth once daily 10/23/16  Yes Biagio Borg, MD  furosemide (LASIX) 40 MG tablet 1 tab by mouth in the AM daily, with 1 by mouth in the PM as needed for swelling only 05/17/16  Yes Biagio Borg, MD  naphazoline-pheniramine (NAPHCON-A) 0.025-0.3 % ophthalmic solution Place 1 drop into both eyes 4 (four) times daily as needed for irritation.   Yes [provider]  tamsulosin (FLOMAX) 0.4 MG CAPS capsule take 1 capsule by mouth once daily 10/18/16  Yes Biagio Borg, MD  amLODipine (NORVASC) 5 MG tablet take 1 tablet by mouth once daily Patient not taking: Reported on 12/17/2016 11/12/16   Biagio Borg, MD  pantoprazole (PROTONIX) 40 MG tablet take 1 tablet by mouth once daily Patient not taking: Reported on 12/17/2016 11/08/16   Biagio Borg, MD  tiZANidine (ZANAFLEX) 4 MG tablet Take 1 tablet (4 mg total) by mouth every 6 (six) hours as needed for muscle spasms. Patient not taking: Reported on 12/17/2016 12/22/13   Biagio Borg, MD  traMADol (ULTRAM) 50 MG tablet Take 1 tablet (50 mg total) by mouth every 8 (eight) hours as needed. Patient not taking: Reported on 12/17/2016 07/13/16   Lyndal Pulley, DO  triamcinolone (NASACORT AQ) 55 MCG/ACT AERO nasal inhaler Place 2 sprays into the nose daily. Patient not taking: Reported on 12/17/2016 11/16/16   Biagio Borg, MD    Family History Family History  Problem Relation Age of Onset  . Heart disease Father   . Lupus Brother   . Hypertension Brother   . Asthma Other     Social History Social History  Substance Use Topics  . Smoking status: Former Research scientist (life sciences)  . Smokeless tobacco: Never Used  . Alcohol use No     Allergies   Ace inhibitors; Doxycycline; and Sulfa antibiotics   Review of Systems Review of Systems  All other systems reviewed and are  negative.    Physical Exam Updated Vital Signs BP 138/77 (BP Location: Left Arm)   Pulse (!) 54   Temp 97.6 F (36.4 C) (Oral)   Resp 20   Ht 5\' 9"  (1.753 m)   Wt 290 lb (131.5 kg)   SpO2 99%   BMI 42.83 kg/m   Physical Exam  Constitutional: He is oriented to person, place, and time.  HENT:  Head: Normocephalic and atraumatic.  Eyes: EOM are normal.  Neck: Normal range of motion.  Cardiovascular: Normal rate, regular rhythm and intact distal pulses.   Pulmonary/Chest: Effort normal and breath sounds normal.  Abdominal: He exhibits no distension. There is no tenderness.  Musculoskeletal: Normal range of motion.  Neurological: He is alert and oriented to person, place, and time.  Skin: Skin is warm and dry.  Nursing note and vitals reviewed.    ED Treatments / Results  Labs (all labs ordered are listed, but only abnormal results are displayed) Labs Reviewed  BASIC  METABOLIC PANEL  CBC  I-STAT TROPOININ, ED    EKG  EKG Interpretation  Date/Time:  Monday Dec 17 2016 10:07:28 EDT Ventricular Rate:  55 PR Interval:    QRS Duration: 85 QT Interval:  418 QTC Calculation: 400 R Axis:   5 Text Interpretation:  Sinus rhythm Consider left atrial enlargement Low voltage, precordial leads Borderline ST elevation, lateral leads Baseline wander in lead(s) II III aVL aVF Poor data quality in current ECG precludes serial comparison Confirmed by Dorri Ozturk  MD, Ariyannah Pauling ([id number]) on 12/17/2016 10:57:16 AM       Radiology Dg Chest 2 View  Result Date: 12/17/2016 CLINICAL DATA:  Chest pain and lower extremity edema EXAM: CHEST  2 VIEW COMPARISON:  November 16, 2016 FINDINGS: There is no edema or consolidation. The heart size and pulmonary vascularity are normal. No adenopathy. No pneumothorax. There is degenerative change in the thoracic spine. IMPRESSION: No edema or consolidation. Electronically Signed   By: Lowella Grip III M.D.   On: 12/17/2016 10:56    Procedures Procedures  (including critical care time)  Medications Ordered in ED Medications - No data to display   Initial Impression / Assessment and Plan / ED Course  I have reviewed the triage vital signs and the nursing notes.  Pertinent labs & imaging results that were available during my care of the patient were reviewed by me and considered in my medical decision making (see chart for details).     Patient was somewhat concerning story for unstable angina.  He is scheduled for an outpatient stress Myoview and I have tried to tell the patient home port and this test should be.  He needs to leave the emergency department this time to pick his grandchildren up from school but he understands that his evaluation here in the emergency department is incomplete.  He would at least need another EKG another troponin for me to feel more comfortable about discharge and the patient home.  He has no active symptoms of ACS at this time but his story is concerning.  Doubt DVT and doubt pulmonary embolism.  I've encouraged the patient to return the emergency department today after he picks his grandchildren up or do any other time for new or worsening symptoms.  The patient is alert and oriented 3.  He understands the potential repercussions of an incomplete examination here in the emergency department.  He understands the risk of MI and death.  There is no further questions at this time.  Close primary care and cardiology follow-up  Final Clinical Impressions(s) / ED Diagnoses   Final diagnoses:  Precordial chest pain    New Prescriptions New Prescriptions   No medications on file     Jola Schmidt, MD 12/17/16 1205

## 2016-12-17 NOTE — Telephone Encounter (Signed)
Patient Name: Troy Palmer DOB: 12/31/1945 Initial Comment Caller states he's having chest pains. Nurse Assessment Nurse: Ronnald Ramp, RN, Miranda Date/Time (Eastern Time): 12/17/2016 9:24:38 AM Confirm and document reason for call. If symptomatic, describe symptoms. ---Caller states he is having swelling in his legs and feet worse since yesterday morning. He has been having chest pain off and on for 1 month. He is having chest pain today for 2.5 hrs. Does the patient have any new or worsening symptoms? ---Yes Will a triage be completed? ---Yes Related visit to physician within the last 2 weeks? ---No Does the PT have any chronic conditions? (i.e. diabetes, asthma, etc.) ---Yes List chronic conditions. ---HTN, High Cholesterol, Edema Is this a behavioral health or substance abuse call? ---No Guidelines Guideline Title Affirmed Question Affirmed Notes Chest Pain [1] Chest pain lasts > 5 minutes AND [2] age > 65 Final Disposition User Call EMS 911 Now Ronnald Ramp, RN, Miranda Referrals [facility] - ED Disagree/Comply: Disagree Disagree/Comply Reason: Disagree with instructions - not calling 911 but will go to ED

## 2016-12-17 NOTE — ED Notes (Signed)
Patient transported to X-ray 

## 2016-12-17 NOTE — ED Triage Notes (Signed)
Pt complaint of intermittent left chest pressure with associated lower extremity swelling for a month.

## 2016-12-17 NOTE — ED Notes (Addendum)
This write is triage nurse. Campos MD and pt walked to triage and Jeanell Sparrow has discussed AMA with pt and asked if pt need to sign prior to leaving. This write had pt sign AMA.  Pt walked with steady gait from triage to lobby.

## 2016-12-17 NOTE — ED Notes (Signed)
Bed: WA06 Expected date:  Expected time:  Means of arrival:  Comments: 

## 2016-12-18 ENCOUNTER — Other Ambulatory Visit: Payer: Self-pay | Admitting: Internal Medicine

## 2017-01-12 ENCOUNTER — Encounter (HOSPITAL_COMMUNITY): Payer: Self-pay | Admitting: *Deleted

## 2017-01-12 ENCOUNTER — Emergency Department (HOSPITAL_COMMUNITY): Payer: PPO

## 2017-01-12 ENCOUNTER — Emergency Department (HOSPITAL_COMMUNITY)
Admission: EM | Admit: 2017-01-12 | Discharge: 2017-01-12 | Disposition: A | Discharge status: Home / Self Care | Payer: PPO | Attending: Emergency Medicine | Admitting: Emergency Medicine

## 2017-01-12 DIAGNOSIS — J020 Streptococcal pharyngitis: Secondary | ICD-10-CM | POA: Insufficient documentation

## 2017-01-12 DIAGNOSIS — J45909 Unspecified asthma, uncomplicated: Secondary | ICD-10-CM | POA: Insufficient documentation

## 2017-01-12 DIAGNOSIS — Z96659 Presence of unspecified artificial knee joint: Secondary | ICD-10-CM | POA: Insufficient documentation

## 2017-01-12 DIAGNOSIS — Z8673 Personal history of transient ischemic attack (TIA), and cerebral infarction without residual deficits: Secondary | ICD-10-CM | POA: Diagnosis not present

## 2017-01-12 DIAGNOSIS — Z87891 Personal history of nicotine dependence: Secondary | ICD-10-CM | POA: Insufficient documentation

## 2017-01-12 DIAGNOSIS — I129 Hypertensive chronic kidney disease with stage 1 through stage 4 chronic kidney disease, or unspecified chronic kidney disease: Secondary | ICD-10-CM | POA: Insufficient documentation

## 2017-01-12 DIAGNOSIS — Z79899 Other long term (current) drug therapy: Secondary | ICD-10-CM | POA: Insufficient documentation

## 2017-01-12 DIAGNOSIS — R590 Localized enlarged lymph nodes: Secondary | ICD-10-CM | POA: Insufficient documentation

## 2017-01-12 DIAGNOSIS — R5383 Other fatigue: Secondary | ICD-10-CM | POA: Insufficient documentation

## 2017-01-12 DIAGNOSIS — N189 Chronic kidney disease, unspecified: Secondary | ICD-10-CM | POA: Diagnosis not present

## 2017-01-12 DIAGNOSIS — J029 Acute pharyngitis, unspecified: Secondary | ICD-10-CM | POA: Diagnosis not present

## 2017-01-12 LAB — COMPREHENSIVE METABOLIC PANEL
ALT: 15 U/L — AB (ref 17–63)
AST: 26 U/L (ref 15–41)
Albumin: 3 g/dL — ABNORMAL LOW (ref 3.5–5.0)
Alkaline Phosphatase: 125 U/L (ref 38–126)
Anion gap: 9 (ref 5–15)
BUN: 13 mg/dL (ref 6–20)
CO2: 28 mmol/L (ref 22–32)
CREATININE: 2.09 mg/dL — AB (ref 0.61–1.24)
Calcium: 8.4 mg/dL — ABNORMAL LOW (ref 8.9–10.3)
Chloride: 101 mmol/L (ref 101–111)
GFR calc Af Amer: 35 mL/min — ABNORMAL LOW (ref >60)
GFR calc non Af Amer: 30 mL/min — ABNORMAL LOW (ref >60)
Glucose, Bld: 109 mg/dL — ABNORMAL HIGH (ref 65–99)
Potassium: 3.5 mmol/L (ref 3.5–5.1)
SODIUM: 138 mmol/L (ref 135–145)
Total Bilirubin: 0.8 mg/dL (ref 0.3–1.2)
Total Protein: 6.8 g/dL (ref 6.5–8.1)

## 2017-01-12 LAB — CBC WITH DIFFERENTIAL/PLATELET
BASOS ABS: 0 10*3/uL (ref 0.0–0.1)
Basophils Relative: 0 %
EOS ABS: 0 10*3/uL (ref 0.0–0.7)
EOS PCT: 0 %
HCT: 39.1 % (ref 39.0–52.0)
Hemoglobin: 12.7 g/dL — ABNORMAL LOW (ref 13.0–17.0)
Lymphocytes Relative: 9 %
Lymphs Abs: 1.3 10*3/uL (ref 0.7–4.0)
MCH: 26.7 pg (ref 26.0–34.0)
MCHC: 32.5 g/dL (ref 30.0–36.0)
MCV: 82.1 fL (ref 78.0–100.0)
Monocytes Absolute: 2.3 10*3/uL — ABNORMAL HIGH (ref 0.1–1.0)
Monocytes Relative: 16 %
Neutro Abs: 10.4 10*3/uL — ABNORMAL HIGH (ref 1.7–7.7)
Neutrophils Relative %: 75 %
PLATELETS: 207 10*3/uL (ref 150–400)
RBC: 4.76 MIL/uL (ref 4.22–5.81)
RDW: 13.8 % (ref 11.5–15.5)
WBC: 14.1 10*3/uL — AB (ref 4.0–10.5)

## 2017-01-12 LAB — RAPID STREP SCREEN (MED CTR MEBANE ONLY): Streptococcus, Group A Screen (Direct): POSITIVE — AB

## 2017-01-12 LAB — I-STAT CG4 LACTIC ACID, ED: Lactic Acid, Venous: 1.53 mmol/L (ref 0.5–1.9)

## 2017-01-12 MED ORDER — CLINDAMYCIN PHOSPHATE 600 MG/50ML IV SOLN
600.0000 mg | Freq: Once | INTRAVENOUS | Status: AC
  Administered 2017-01-12: 600 mg via INTRAVENOUS
  Filled 2017-01-12: qty 50

## 2017-01-12 MED ORDER — ACETAMINOPHEN 325 MG PO TABS
650.0000 mg | ORAL_TABLET | ORAL | Status: DC | PRN
  Administered 2017-01-12: 650 mg via ORAL
  Filled 2017-01-12: qty 2

## 2017-01-12 MED ORDER — CLINDAMYCIN HCL 300 MG PO CAPS: 300.0000 mg | ORAL_CAPSULE | Freq: Four times a day (QID) | ORAL | 0 refills | Status: DC

## 2017-01-12 MED ORDER — DEXAMETHASONE SODIUM PHOSPHATE 10 MG/ML IJ SOLN
10.0000 mg | Freq: Once | INTRAMUSCULAR | Status: DC
  Filled 2017-01-12: qty 1

## 2017-01-12 MED ORDER — DEXAMETHASONE SODIUM PHOSPHATE 10 MG/ML IJ SOLN
10.0000 mg | Freq: Four times a day (QID) | INTRAMUSCULAR | Status: DC
  Administered 2017-01-12 (×2): 10 mg via INTRAVENOUS
  Filled 2017-01-12: qty 1

## 2017-01-12 MED ORDER — SODIUM CHLORIDE 0.9 % IV BOLUS (SEPSIS)
500.0000 mL | Freq: Once | INTRAVENOUS | Status: AC
  Administered 2017-01-12: 500 mL via INTRAVENOUS

## 2017-01-12 NOTE — ED Notes (Signed)
Pt was able to eat [place] sandwich, applesauce, and coke without any nausea/vomiting/choking.

## 2017-01-12 NOTE — ED Provider Notes (Signed)
[facility] DEPT Provider Note   CSN: [id number] Arrival date & time: 01/12/17  0725     History   Chief Complaint Chief Complaint  Patient presents with  . Sore Throat    HPI Troy Palmer is a 71 y.o. male.  HPI Patient presents with sore throat starting yesterday. States he's had pain with swallowing. Denies any difficulty breathing or voice changes. Has had fevers and chills at home. No known sick contacts. No recent dental procedures. Past Medical History:  Diagnosis Date  . ALLERGIC RHINITIS 04/09/2007  . ASTHMA 12/04/2007  . BACK PAIN 09/16/2009  . BENIGN PROSTATIC HYPERTROPHY 04/09/2007  . CEREBROVASCULAR ACCIDENT, HX OF 07/17/2010  . COLONIC POLYPS, HX OF 12/04/2007  . CONSTIPATION 09/25/2010  . Degeneration of cervical intervertebral disc 03/28/2007  . DEPRESSION 12/04/2007  . DIVERTICULOSIS, COLON 12/04/2007  . DYSPHAGIA UNSPECIFIED 03/16/2008  . ERECTILE DYSFUNCTION 04/09/2007  . ESOPHAGEAL STRICTURE 05/26/2008  . GERD 12/04/2007  . HEMORRHOIDS, RECURRENT 02/11/2008  . HYPERLIPIDEMIA 12/04/2007  . HYPERTENSION 03/28/2007  . LEG PAIN, BILATERAL 01/25/2010  . LOW BACK PAIN 04/09/2007  . LUNG NODULE 12/04/2007  . Osteoarth NOS-Unspec 03/28/2007  . Other dysphagia 11/21/2009  . POLYARTHRALGIA 07/13/2008  . Polymyalgia rheumatica ([place]) 07/26/2008  . PVD WITH CLAUDICATION 02/10/2010  . RENAL INSUFFICIENCY 03/15/2010  . SLEEP APNEA, OBSTRUCTIVE, MODERATE 04/09/2007  . TB SKIN TEST, POSITIVE 03/28/2007    Patient Active Problem List   Diagnosis Date Noted  . Dyspnea 11/16/2016  . Complete tear of right rotator cuff 06/26/2016  . Cellulitis of deltoid region 06/26/2016  . Greater trochanteric bursitis of right hip 05/05/2014  . Right hip pain 05/04/2014  . Posterior neck pain 12/22/2013  . Orchitis, right 12/22/2013  . Paresthesia of both feet 12/22/2013  . Bilateral shoulder pain 09/16/2013  . Allergic angioedema 07/31/2012  . Impaired glucose tolerance 04/15/2012  . Chest  pain 02/24/2012  . Foot pain, bilateral 02/20/2012  . Leg pain, bilateral 01/19/2012  . Edema 01/16/2012  . Dysphagia 11/21/2011  . Gout 06/07/2011  . CKD (chronic kidney disease) 06/07/2011  . Wellness examination 02/14/2011  . Encounter for long-term (current) use of other medications 11/08/2010  . CONSTIPATION 09/25/2010  . Memory loss 06/28/2010  . PVD (peripheral vascular disease) ([place]) 02/10/2010  . LEG PAIN, BILATERAL 01/25/2010  . BACK PAIN 09/16/2009  . Polymyalgia rheumatica ([place]) 07/26/2008  . POLYARTHRALGIA 07/13/2008  . ESOPHAGEAL STRICTURE 05/26/2008  . HEMORRHOIDS, RECURRENT 02/11/2008  . Hyperlipidemia 12/04/2007  . DEPRESSION 12/04/2007  . ASTHMA 12/04/2007  . LUNG NODULE 12/04/2007  . GERD 12/04/2007  . DIVERTICULOSIS, COLON 12/04/2007  . COLONIC POLYPS, HX OF 12/04/2007  . ERECTILE DYSFUNCTION 04/09/2007  . SLEEP APNEA, OBSTRUCTIVE, MODERATE 04/09/2007  . Allergic rhinitis 04/09/2007  . BENIGN PROSTATIC HYPERTROPHY 04/09/2007  . LOW BACK PAIN 04/09/2007  . OBESITY 03/28/2007  . Essential hypertension 03/28/2007  . Osteoarth NOS-Unspec 03/28/2007  . Degeneration of cervical intervertebral disc 03/28/2007  . TB SKIN TEST, POSITIVE 03/28/2007    Past Surgical History:  Procedure Laterality Date  . ROTATOR CUFF REPAIR     Left  . TOTAL KNEE ARTHROPLASTY     x 2       Home Medications    Prior to Admission medications   Medication Sig Start Date End Date Taking? Authorizing Provider  allopurinol (ZYLOPRIM) 100 MG tablet take 1 tablet by mouth once daily 12/18/16  Yes Biagio Borg, MD  atorvastatin (LIPITOR) 40 MG tablet take 1 tablet by mouth  once daily 10/28/14  Yes Biagio Borg, MD  BYSTOLIC 10 MG tablet take 2 tablets by mouth once daily 10/23/16  Yes Biagio Borg, MD  furosemide (LASIX) 40 MG tablet 1 tab by mouth in the AM daily, with 1 by mouth in the PM as needed for swelling only 05/17/16  Yes Biagio Borg, MD  naphazoline-pheniramine  (NAPHCON-A) 0.025-0.3 % ophthalmic solution Place 1 drop into both eyes 4 (four) times daily as needed for irritation.   Yes [provider]  tamsulosin (FLOMAX) 0.4 MG CAPS capsule take 1 capsule by mouth once daily 10/18/16  Yes Biagio Borg, MD  amLODipine (NORVASC) 5 MG tablet take 1 tablet by mouth once daily Patient not taking: Reported on 12/17/2016 11/12/16   Biagio Borg, MD  clindamycin (CLEOCIN) 300 MG capsule Take 1 capsule (300 mg total) by mouth 4 (four) times daily. X 7 days 01/12/17   Julianne Rice, MD  pantoprazole (PROTONIX) 40 MG tablet Take 40 mg by mouth daily. 11/08/16   [provider]  tiZANidine (ZANAFLEX) 4 MG tablet Take 1 tablet (4 mg total) by mouth every 6 (six) hours as needed for muscle spasms. Patient not taking: Reported on 12/17/2016 12/22/13   Biagio Borg, MD    Family History Family History  Problem Relation Age of Onset  . Heart disease Father   . Lupus Brother   . Hypertension Brother   . Asthma Other     Social History Social History  Substance Use Topics  . Smoking status: Former Research scientist (life sciences)  . Smokeless tobacco: Never Used  . Alcohol use No     Allergies   Ace inhibitors; Doxycycline; and Sulfa antibiotics   Review of Systems Review of Systems  Constitutional: Positive for chills, fatigue and fever.  HENT: Positive for sore throat. Negative for congestion, sinus pressure, trouble swallowing and voice change.   Respiratory: Negative for cough and shortness of breath.   Cardiovascular: Negative for chest pain, palpitations and leg swelling.  Gastrointestinal: Negative for abdominal pain, nausea and vomiting.  Musculoskeletal: Positive for myalgias. Negative for arthralgias, back pain, joint swelling, neck pain and neck stiffness.  Skin: Negative for rash and wound.  Neurological: Negative for dizziness, weakness, light-headedness, numbness and headaches.  All other systems reviewed and are negative.    Physical  Exam Updated Vital Signs BP (!) 142/54   Pulse (!) 59   Temp 98.3 F (36.8 C) (Oral)   Resp 20   Ht 5\' 9"  (1.753 m)   Wt 131.5 kg (290 lb)   SpO2 96%   BMI 42.83 kg/m   Physical Exam  Constitutional: He is oriented to person, place, and time. He appears well-developed and well-nourished. No distress.  HENT:  Head: Normocephalic and atraumatic.  Mouth/Throat: Oropharyngeal exudate present.  Bilateral tonsillar hypertrophy. Exudate on the right. Uvula is midline.  Eyes: EOM are normal. Pupils are equal, round, and reactive to light.  Neck: Normal range of motion. Neck supple.  No meningismus  Cardiovascular: Normal rate and regular rhythm.  Exam reveals no gallop and no friction rub.   No murmur heard. Pulmonary/Chest: Effort normal and breath sounds normal. No respiratory distress. He has no wheezes. He has no rales. He exhibits no tenderness.  Abdominal: Soft. Bowel sounds are normal. There is no tenderness. There is no rebound and no guarding.  Musculoskeletal: Normal range of motion. He exhibits no edema or tenderness.  Lymphadenopathy:    He has cervical adenopathy.  Neurological:  He is alert and oriented to person, place, and time.  Skin: Skin is warm and dry. Capillary refill takes less than 2 seconds. No rash noted. No erythema.  Psychiatric: He has a normal mood and affect. His behavior is normal.  Nursing note and vitals reviewed.    ED Treatments / Results  Labs (all labs ordered are listed, but only abnormal results are displayed) Labs Reviewed  RAPID STREP SCREEN (NOT AT [facility]) - Abnormal; Notable for the following:       Result Value   Streptococcus, Group A Screen (Direct) POSITIVE (*)    All other components within normal limits  CBC WITH DIFFERENTIAL/PLATELET - Abnormal; Notable for the following:    WBC 14.1 (*)    Hemoglobin 12.7 (*)    Neutro Abs 10.4 (*)    Monocytes Absolute 2.3 (*)    All other components within normal limits  COMPREHENSIVE  METABOLIC PANEL - Abnormal; Notable for the following:    Glucose, Bld 109 (*)    Creatinine, Ser 2.09 (*)    Calcium 8.4 (*)    Albumin 3.0 (*)    ALT 15 (*)    GFR calc non Af Amer 30 (*)    GFR calc Af Amer 35 (*)    All other components within normal limits  I-STAT CG4 LACTIC ACID, ED    EKG  EKG Interpretation None       Radiology No results found.  Procedures Procedures (including critical care time)  Medications Ordered in ED Medications  sodium chloride 0.9 % bolus 500 mL (0 mLs Intravenous Stopped 01/12/17 1048)  clindamycin (CLEOCIN) IVPB 600 mg (0 mg Intravenous Stopped 01/12/17 1130)     Initial Impression / Assessment and Plan / ED Course  I have reviewed the triage vital signs and the nursing notes.  Pertinent labs & imaging results that were available during my care of the patient were reviewed by me and considered in my medical decision making (see chart for details).     Patient is feeling better after medication. Fever has resolved. Given IM antibiotics. Strict return precautions given.  Final Clinical Impressions(s) / ED Diagnoses   Final diagnoses:  Pharyngitis due to Streptococcus species    New Prescriptions Discharge Medication List as of 01/12/2017  3:32 PM    START taking these medications   Details  clindamycin (CLEOCIN) 300 MG capsule Take 1 capsule (300 mg total) by mouth 4 (four) times daily. X 7 days, Starting Sat 01/12/2017, Print         Julianne Rice, MD 01/15/17 [phone]

## 2017-01-12 NOTE — ED Notes (Signed)
Pt given "happy meal" and coke to drink, very happy to have something to eat. Eating well

## 2017-01-12 NOTE — ED Notes (Signed)
On way to CT 

## 2017-01-12 NOTE — ED Triage Notes (Signed)
PT reports a sore throat with a cough and ear ache.

## 2017-01-16 ENCOUNTER — Ambulatory Visit: Payer: PPO

## 2017-01-16 NOTE — Progress Notes (Deleted)
Subjective:   Troy Palmer is a 71 y.o. male who presents for Medicare Annual/Subsequent preventive examination.  Review of Systems:  No ROS.  Medicare Wellness Visit.    Sleep patterns: {SX; SLEEP PATTERNS:18802::"feels rested on waking","does not get up to void","gets up *** times nightly to void","sleeps *** hours nightly"}.    Home Safety/Smoke Alarms: Feels safe in home. Smoke alarms in place.  Living environment; residence and Firearm Safety: {Rehab home environment / accessibility:30080::"no firearms","firearms stored safely"}. Seat Belt Safety/Bike Helmet: Wears seat belt.   Counseling:   Eye Exam-  Dental-  Male:   CCS- Last 10/30/10, normal, recall 10 years    PSA-  Lab Results  Component Value Date   PSA 1.13 11/16/2015   PSA 1.49 10/15/2013   PSA 1.21 10/14/2012       Objective:    Vitals: There were no vitals taken for this visit.  There is no height or weight on file to calculate BMI.  Tobacco History  Smoking Status  . Former Smoker  Smokeless Tobacco  . Never Used     Counseling given: Not Answered   Past Medical History:  Diagnosis Date  . ALLERGIC RHINITIS 04/09/2007  . ASTHMA 12/04/2007  . BACK PAIN 09/16/2009  . BENIGN PROSTATIC HYPERTROPHY 04/09/2007  . CEREBROVASCULAR ACCIDENT, HX OF 07/17/2010  . COLONIC POLYPS, HX OF 12/04/2007  . CONSTIPATION 09/25/2010  . Degeneration of cervical intervertebral disc 03/28/2007  . DEPRESSION 12/04/2007  . DIVERTICULOSIS, COLON 12/04/2007  . DYSPHAGIA UNSPECIFIED 03/16/2008  . ERECTILE DYSFUNCTION 04/09/2007  . ESOPHAGEAL STRICTURE 05/26/2008  . GERD 12/04/2007  . HEMORRHOIDS, RECURRENT 02/11/2008  . HYPERLIPIDEMIA 12/04/2007  . HYPERTENSION 03/28/2007  . LEG PAIN, BILATERAL 01/25/2010  . LOW BACK PAIN 04/09/2007  . LUNG NODULE 12/04/2007  . Osteoarth NOS-Unspec 03/28/2007  . Other dysphagia 11/21/2009  . POLYARTHRALGIA 07/13/2008  . Polymyalgia rheumatica ([place]) 07/26/2008  . PVD WITH CLAUDICATION 02/10/2010  .  RENAL INSUFFICIENCY 03/15/2010  . SLEEP APNEA, OBSTRUCTIVE, MODERATE 04/09/2007  . TB SKIN TEST, POSITIVE 03/28/2007   Past Surgical History:  Procedure Laterality Date  . ROTATOR CUFF REPAIR     Left  . TOTAL KNEE ARTHROPLASTY     x 2   Family History  Problem Relation Age of Onset  . Heart disease Father   . Lupus Brother   . Hypertension Brother   . Asthma Other    History  Sexual Activity  . Sexual activity: Not on file    Outpatient Encounter Prescriptions as of 01/16/2017  Medication Sig  . allopurinol (ZYLOPRIM) 100 MG tablet take 1 tablet by mouth once daily  . amLODipine (NORVASC) 5 MG tablet take 1 tablet by mouth once daily (Patient not taking: Reported on 12/17/2016)  . atorvastatin (LIPITOR) 40 MG tablet take 1 tablet by mouth once daily  . BYSTOLIC 10 MG tablet take 2 tablets by mouth once daily  . clindamycin (CLEOCIN) 300 MG capsule Take 1 capsule (300 mg total) by mouth 4 (four) times daily. X 7 days  . furosemide (LASIX) 40 MG tablet 1 tab by mouth in the AM daily, with 1 by mouth in the PM as needed for swelling only  . naphazoline-pheniramine (NAPHCON-A) 0.025-0.3 % ophthalmic solution Place 1 drop into both eyes 4 (four) times daily as needed for irritation.  . pantoprazole (PROTONIX) 40 MG tablet Take 40 mg by mouth daily.  . tamsulosin (FLOMAX) 0.4 MG CAPS capsule take 1 capsule by mouth once daily  . tiZANidine (  ZANAFLEX) 4 MG tablet Take 1 tablet (4 mg total) by mouth every 6 (six) hours as needed for muscle spasms. (Patient not taking: Reported on 12/17/2016)   No facility-administered encounter medications on file as of 01/16/2017.     Activities of Daily Living No flowsheet data found.  Patient Care Team: Biagio Borg, MD as PCP - General   Assessment:    Physical assessment deferred to PCP.  Exercise Activities and Dietary recommendations   Diet (meal preparation, eat out, water intake, caffeinated beverages, dairy products, fruits and vegetables):  {Desc; diets:16563} Breakfast: Lunch:  Dinner:      Goals    None     Fall Risk Fall Risk  11/16/2016 11/16/2015 10/15/2013  Falls in the past year? No No No   Depression Screen PHQ 2/9 Scores 11/16/2016 11/16/2015 11/13/2015 10/19/2015  PHQ - 2 Score 0 0 0 0    Cognitive Function        Immunization History  Administered Date(s) Administered  . Influenza Whole 05/26/2008, 05/21/2012, 05/11/2013  . Influenza-Unspecified 05/29/2014  . Pneumococcal Conjugate-13 10/15/2013  . Pneumococcal Polysaccharide-23 10/15/2011  . Tetanus 10/14/2012   Screening Tests Health Maintenance  Topic Date Due  . INFLUENZA VACCINE  03/13/2017  . COLONOSCOPY  10/29/2020  . TETANUS/TDAP  10/15/2022  . Hepatitis C Screening  Completed  . PNA vac Low Risk Adult  Completed      Plan:     I have personally reviewed and noted the following in the patient's chart:   . Medical and social history . Use of alcohol, tobacco or illicit drugs  . Current medications and supplements . Functional ability and status . Nutritional status . Physical activity . Advanced directives . List of other physicians . Vitals . Screenings to include cognitive, depression, and falls . Referrals and appointments  In addition, I have reviewed and discussed with patient certain preventive protocols, quality metrics, and best practice recommendations. A written personalized care plan for preventive services as well as general preventive health recommendations were provided to patient.     Michiel Cowboy, RN  01/16/2017

## 2017-01-16 NOTE — Progress Notes (Deleted)
Pre visit review using our clinic review tool, if applicable. No additional management support is needed unless otherwise documented below in the visit note. 

## 2017-01-29 ENCOUNTER — Other Ambulatory Visit: Payer: Self-pay

## 2017-01-29 ENCOUNTER — Emergency Department (HOSPITAL_COMMUNITY)
Admission: EM | Admit: 2017-01-29 | Discharge: 2017-01-29 | Disposition: A | Discharge status: Home / Self Care | Payer: PPO | Attending: Emergency Medicine | Admitting: Emergency Medicine

## 2017-01-29 ENCOUNTER — Ambulatory Visit (HOSPITAL_COMMUNITY): Payer: PPO

## 2017-01-29 ENCOUNTER — Emergency Department (HOSPITAL_COMMUNITY): Payer: PPO

## 2017-01-29 ENCOUNTER — Encounter (HOSPITAL_COMMUNITY): Payer: Self-pay | Admitting: Emergency Medicine

## 2017-01-29 DIAGNOSIS — R0789 Other chest pain: Secondary | ICD-10-CM | POA: Insufficient documentation

## 2017-01-29 DIAGNOSIS — R079 Chest pain, unspecified: Secondary | ICD-10-CM | POA: Diagnosis not present

## 2017-01-29 DIAGNOSIS — Z79899 Other long term (current) drug therapy: Secondary | ICD-10-CM | POA: Insufficient documentation

## 2017-01-29 DIAGNOSIS — N189 Chronic kidney disease, unspecified: Secondary | ICD-10-CM | POA: Insufficient documentation

## 2017-01-29 DIAGNOSIS — Z8673 Personal history of transient ischemic attack (TIA), and cerebral infarction without residual deficits: Secondary | ICD-10-CM | POA: Insufficient documentation

## 2017-01-29 DIAGNOSIS — Z87891 Personal history of nicotine dependence: Secondary | ICD-10-CM | POA: Insufficient documentation

## 2017-01-29 DIAGNOSIS — I129 Hypertensive chronic kidney disease with stage 1 through stage 4 chronic kidney disease, or unspecified chronic kidney disease: Secondary | ICD-10-CM | POA: Insufficient documentation

## 2017-01-29 DIAGNOSIS — J45909 Unspecified asthma, uncomplicated: Secondary | ICD-10-CM | POA: Diagnosis not present

## 2017-01-29 LAB — CBC
HEMATOCRIT: 37.2 % — AB (ref 39.0–52.0)
HEMOGLOBIN: 11.9 g/dL — AB (ref 13.0–17.0)
MCH: 26.3 pg (ref 26.0–34.0)
MCHC: 32 g/dL (ref 30.0–36.0)
MCV: 82.3 fL (ref 78.0–100.0)
Platelets: 273 10*3/uL (ref 150–400)
RBC: 4.52 MIL/uL (ref 4.22–5.81)
RDW: 13.8 % (ref 11.5–15.5)
WBC: 4.9 10*3/uL (ref 4.0–10.5)

## 2017-01-29 LAB — I-STAT TROPONIN, ED
TROPONIN I, POC: 0 ng/mL (ref 0.00–0.08)
Troponin i, poc: 0 ng/mL (ref 0.00–0.08)

## 2017-01-29 LAB — BASIC METABOLIC PANEL
ANION GAP: 5 (ref 5–15)
BUN: 13 mg/dL (ref 6–20)
CALCIUM: 8.7 mg/dL — AB (ref 8.9–10.3)
CHLORIDE: 107 mmol/L (ref 101–111)
CO2: 26 mmol/L (ref 22–32)
Creatinine, Ser: 1.59 mg/dL — ABNORMAL HIGH (ref 0.61–1.24)
GFR calc Af Amer: 49 mL/min — ABNORMAL LOW (ref >60)
GFR calc non Af Amer: 42 mL/min — ABNORMAL LOW (ref >60)
GLUCOSE: 91 mg/dL (ref 65–99)
Potassium: 4.2 mmol/L (ref 3.5–5.1)
Sodium: 138 mmol/L (ref 135–145)

## 2017-01-29 NOTE — ED Notes (Signed)
Back from xray

## 2017-01-29 NOTE — ED Notes (Signed)
Patient transported to X-ray 

## 2017-01-29 NOTE — ED Triage Notes (Signed)
Per EMS pt was having an ECHO at [facility] for chest pain x2 weeks that begins under left arm and radiates to back and chest.  Pt was bradycardiac with a rate in the low 50's.  PTA pt had 324 aspirin and 1 sublingual nitro with relief.

## 2017-01-29 NOTE — ED Provider Notes (Signed)
[facility] DEPT Provider Note   CSN: [id number] Arrival date & time: 01/29/17  1059     History   Chief Complaint Chief Complaint  Patient presents with  . Chest Pain    HPI Troy Palmer is a 71 y.o. male.  HPI Patient is a 71 year old male with no known coronary disease who presents to the emergency department where he was receiving an echocardiogram when he described to the team that he was having sharp tingly left-sided chest discomfort for which he was brought to the emergency department for evaluation.  He was given aspirin and nitroglycerin.  He is without any discomfort or pain at this time.  He states that the sharp and burning left-sided chest discomfort has been rather constant over the past 2 weeks.  He states it begins under his left arm and radiates towards his back and his chest.  No history of thoracic aneurysm.  No prior heart catheterization.  No associated shortness of breath.  Denies nausea vomiting.  No diaphoresis.   Past Medical History:  Diagnosis Date  . ALLERGIC RHINITIS 04/09/2007  . ASTHMA 12/04/2007  . BACK PAIN 09/16/2009  . BENIGN PROSTATIC HYPERTROPHY 04/09/2007  . CEREBROVASCULAR ACCIDENT, HX OF 07/17/2010  . COLONIC POLYPS, HX OF 12/04/2007  . CONSTIPATION 09/25/2010  . Degeneration of cervical intervertebral disc 03/28/2007  . DEPRESSION 12/04/2007  . DIVERTICULOSIS, COLON 12/04/2007  . DYSPHAGIA UNSPECIFIED 03/16/2008  . ERECTILE DYSFUNCTION 04/09/2007  . ESOPHAGEAL STRICTURE 05/26/2008  . GERD 12/04/2007  . HEMORRHOIDS, RECURRENT 02/11/2008  . HYPERLIPIDEMIA 12/04/2007  . HYPERTENSION 03/28/2007  . LEG PAIN, BILATERAL 01/25/2010  . LOW BACK PAIN 04/09/2007  . LUNG NODULE 12/04/2007  . Osteoarth NOS-Unspec 03/28/2007  . Other dysphagia 11/21/2009  . POLYARTHRALGIA 07/13/2008  . Polymyalgia rheumatica ([place]) 07/26/2008  . PVD WITH CLAUDICATION 02/10/2010  . RENAL INSUFFICIENCY 03/15/2010  . SLEEP APNEA, OBSTRUCTIVE, MODERATE 04/09/2007  . TB SKIN TEST,  POSITIVE 03/28/2007    Patient Active Problem List   Diagnosis Date Noted  . Dyspnea 11/16/2016  . Complete tear of right rotator cuff 06/26/2016  . Cellulitis of deltoid region 06/26/2016  . Greater trochanteric bursitis of right hip 05/05/2014  . Right hip pain 05/04/2014  . Posterior neck pain 12/22/2013  . Orchitis, right 12/22/2013  . Paresthesia of both feet 12/22/2013  . Bilateral shoulder pain 09/16/2013  . Allergic angioedema 07/31/2012  . Impaired glucose tolerance 04/15/2012  . Chest pain 02/24/2012  . Foot pain, bilateral 02/20/2012  . Leg pain, bilateral 01/19/2012  . Edema 01/16/2012  . Dysphagia 11/21/2011  . Gout 06/07/2011  . CKD (chronic kidney disease) 06/07/2011  . Wellness examination 02/14/2011  . Encounter for long-term (current) use of other medications 11/08/2010  . CONSTIPATION 09/25/2010  . Memory loss 06/28/2010  . PVD (peripheral vascular disease) ([place]) 02/10/2010  . LEG PAIN, BILATERAL 01/25/2010  . BACK PAIN 09/16/2009  . Polymyalgia rheumatica ([place]) 07/26/2008  . POLYARTHRALGIA 07/13/2008  . ESOPHAGEAL STRICTURE 05/26/2008  . HEMORRHOIDS, RECURRENT 02/11/2008  . Hyperlipidemia 12/04/2007  . DEPRESSION 12/04/2007  . ASTHMA 12/04/2007  . LUNG NODULE 12/04/2007  . GERD 12/04/2007  . DIVERTICULOSIS, COLON 12/04/2007  . COLONIC POLYPS, HX OF 12/04/2007  . ERECTILE DYSFUNCTION 04/09/2007  . SLEEP APNEA, OBSTRUCTIVE, MODERATE 04/09/2007  . Allergic rhinitis 04/09/2007  . BENIGN PROSTATIC HYPERTROPHY 04/09/2007  . LOW BACK PAIN 04/09/2007  . OBESITY 03/28/2007  . Essential hypertension 03/28/2007  . Osteoarth NOS-Unspec 03/28/2007  . Degeneration of cervical intervertebral disc 03/28/2007  .  TB SKIN TEST, POSITIVE 03/28/2007    Past Surgical History:  Procedure Laterality Date  . ROTATOR CUFF REPAIR     Left  . TOTAL KNEE ARTHROPLASTY     x 2       Home Medications    Prior to Admission medications   Medication Sig Start Date  End Date Taking? Authorizing Provider  allopurinol (ZYLOPRIM) 100 MG tablet take 1 tablet by mouth once daily 12/18/16  Yes Biagio Borg, MD  amLODipine (NORVASC) 5 MG tablet take 1 tablet by mouth once daily 11/12/16  Yes Biagio Borg, MD  atorvastatin (LIPITOR) 40 MG tablet take 1 tablet by mouth once daily 10/28/14  Yes Biagio Borg, MD  BYSTOLIC 10 MG tablet take 2 tablets by mouth once daily 10/23/16  Yes Biagio Borg, MD  furosemide (LASIX) 40 MG tablet 1 tab by mouth in the AM daily, with 1 by mouth in the PM as needed for swelling only 05/17/16  Yes Biagio Borg, MD  naphazoline-pheniramine (NAPHCON-A) 0.025-0.3 % ophthalmic solution Place 1 drop into both eyes 4 (four) times daily as needed for irritation.   Yes [provider]  tamsulosin (FLOMAX) 0.4 MG CAPS capsule take 1 capsule by mouth once daily 10/18/16  Yes Biagio Borg, MD  tiZANidine (ZANAFLEX) 4 MG tablet Take 1 tablet (4 mg total) by mouth every 6 (six) hours as needed for muscle spasms. Patient not taking: Reported on 12/17/2016 12/22/13   Biagio Borg, MD    Family History Family History  Problem Relation Age of Onset  . Heart disease Father   . Lupus Brother   . Hypertension Brother   . Asthma Other     Social History Social History  Substance Use Topics  . Smoking status: Former Research scientist (life sciences)  . Smokeless tobacco: Never Used  . Alcohol use No     Allergies   Ace inhibitors; Doxycycline; and Sulfa antibiotics   Review of Systems Review of Systems  All other systems reviewed and are negative.    Physical Exam Updated Vital Signs BP (!) 156/67   Pulse (!) 46   Temp 97.8 F (36.6 C) (Oral)   Resp 17   Ht 5\' 10"  (1.778 m)   Wt 131.5 kg (290 lb)   SpO2 97%   BMI 41.61 kg/m   Physical Exam  Constitutional: He is oriented to person, place, and time. He appears well-developed and well-nourished.  HENT:  Head: Normocephalic and atraumatic.  Eyes: EOM are normal.  Neck: Normal range of motion.    Cardiovascular: Normal rate, regular rhythm, normal heart sounds and intact distal pulses.   Pulmonary/Chest: Effort normal and breath sounds normal. No respiratory distress. He exhibits no tenderness.  Abdominal: Soft. He exhibits no distension. There is no tenderness.  Musculoskeletal: Normal range of motion.  Neurological: He is alert and oriented to person, place, and time.  Skin: Skin is warm and dry.  Psychiatric: He has a normal mood and affect. Judgment normal.  Nursing note and vitals reviewed.    ED Treatments / Results  Labs (all labs ordered are listed, but only abnormal results are displayed) Labs Reviewed  BASIC METABOLIC PANEL - Abnormal; Notable for the following:       Result Value   Creatinine, Ser 1.59 (*)    Calcium 8.7 (*)    GFR calc non Af Amer 42 (*)    GFR calc Af Amer 49 (*)    All other components within  normal limits  CBC - Abnormal; Notable for the following:    Hemoglobin 11.9 (*)    HCT 37.2 (*)    All other components within normal limits  I-STAT TROPOININ, ED  I-STAT TROPOININ, ED    EKG  EKG Interpretation  Date/Time:  Tuesday January 29 2017 11:08:29 EDT Ventricular Rate:  49 PR Interval:  188 QRS Duration: 84 QT Interval:  454 QTC Calculation: 410 R Axis:   40 Text Interpretation:  Marked sinus bradycardia Early repolarization Abnormal ECG No significant change was found Confirmed by Jola Schmidt [phone]) on 01/29/2017 3:39:09 PM       Radiology Dg Chest 2 View  Result Date: 01/29/2017 CLINICAL DATA:  Left-sided chest pain for 2 weeks. Shortness of breath today. EXAM: CHEST  2 VIEW COMPARISON:  12/17/2016 FINDINGS: The cardiac silhouette, mediastinal and hilar contours are within normal limits and stable. There is mild tortuosity and ectasia of the thoracic aorta. The lungs are clear. No pleural effusion. The bony thorax is intact. Stable AC joint degenerative changes. IMPRESSION: No acute cardiopulmonary findings. Electronically  Signed   By: Marijo Sanes M.D.   On: 01/29/2017 11:59    Procedures Procedures (including critical care time)  Medications Ordered in ED Medications - No data to display   Initial Impression / Assessment and Plan / ED Course  I have reviewed the triage vital signs and the nursing notes.  Pertinent labs & imaging results that were available during my care of the patient were reviewed by me and considered in my medical decision making (see chart for details).   atypical presentation of chest discomfort.  EKG without ischemic changes.  Troponin negative 2.  Referral back to cardiology for ongoing outpatient workup.  I do not think he needs to be hospitalized this time.  I do not think he needs urgent/emergent heart catheterization.  He does need ongoing cardiac workup as he is 71 years old with some risk factors.  I suspect he'll benefit from a noninvasive outpatient test.  He understands return to the ER for any new or worsening symptoms.  Final Clinical Impressions(s) / ED Diagnoses   Final diagnoses:  Chest pain, unspecified type    New Prescriptions New Prescriptions   No medications on file     Jola Schmidt, MD 01/29/17 1540

## 2017-01-29 NOTE — ED Notes (Signed)
EKG given to Dr. Venora Maples

## 2017-02-11 ENCOUNTER — Other Ambulatory Visit: Payer: Self-pay

## 2017-02-11 ENCOUNTER — Ambulatory Visit (HOSPITAL_COMMUNITY): Payer: PPO | Attending: Internal Medicine

## 2017-02-11 DIAGNOSIS — I340 Nonrheumatic mitral (valve) insufficiency: Secondary | ICD-10-CM | POA: Diagnosis not present

## 2017-02-11 DIAGNOSIS — R079 Chest pain, unspecified: Secondary | ICD-10-CM | POA: Diagnosis not present

## 2017-02-11 DIAGNOSIS — R0600 Dyspnea, unspecified: Secondary | ICD-10-CM | POA: Insufficient documentation

## 2017-02-26 ENCOUNTER — Encounter: Payer: Self-pay | Admitting: Internal Medicine

## 2017-02-26 ENCOUNTER — Ambulatory Visit (INDEPENDENT_AMBULATORY_CARE_PROVIDER_SITE_OTHER): Payer: PPO | Admitting: Internal Medicine

## 2017-02-26 VITALS — BP 142/86 | HR 65 | Ht 70.0 in | Wt 295.0 lb

## 2017-02-26 DIAGNOSIS — M109 Gout, unspecified: Secondary | ICD-10-CM | POA: Insufficient documentation

## 2017-02-26 DIAGNOSIS — I10 Essential (primary) hypertension: Secondary | ICD-10-CM

## 2017-02-26 DIAGNOSIS — R7302 Impaired glucose tolerance (oral): Secondary | ICD-10-CM

## 2017-02-26 MED ORDER — COLCHICINE 0.6 MG PO TABS: 0.6000 mg | ORAL_TABLET | Freq: Two times a day (BID) | ORAL | 5 refills | Status: DC

## 2017-02-26 MED ORDER — TRAMADOL HCL 50 MG PO TABS
50.0000 mg | ORAL_TABLET | Freq: Three times a day (TID) | ORAL | 0 refills | Status: DC | PRN
Start: 2017-02-26 — End: 2017-03-29

## 2017-02-26 NOTE — Patient Instructions (Addendum)
OK to the colchicine twice per day, and the tramadol as needed for pain  Please continue all other medications as before, including the allopurinol at 100 mg per day  Please have the pharmacy call with any other refills you may need.  Please continue your efforts at being more active, low cholesterol diet, and weight control.  Please keep your appointments with your specialists as you may have planned

## 2017-02-26 NOTE — Progress Notes (Signed)
Subjective:    Patient ID: Troy Palmer, male    DOB: Feb 02, 1946, 71 y.o.   MRN: [id number]  HPI  Here with 3 days acute onset bilat feet first MCP pain/redness/swelling left > right, constant, sharp, without radiation, worse to walk and stand, better to sit.  No falls but very difficult to walk as both feet hurt.  Pt denies chest pain, increased sob or doe, wheezing, orthopnea, PND, increased LE swelling, palpitations, dizziness or syncope.  Pt denies new neurological symptoms such as new headache, or facial or extremity weakness or numbness   Pt denies fever, wt loss, night sweats, loss of appetite, or other constitutional symptoms  Pt denies polydipsia, polyuria Past Medical History:  Diagnosis Date  . ALLERGIC RHINITIS 04/09/2007  . ASTHMA 12/04/2007  . BACK PAIN 09/16/2009  . BENIGN PROSTATIC HYPERTROPHY 04/09/2007  . CEREBROVASCULAR ACCIDENT, HX OF 07/17/2010  . COLONIC POLYPS, HX OF 12/04/2007  . CONSTIPATION 09/25/2010  . Degeneration of cervical intervertebral disc 03/28/2007  . DEPRESSION 12/04/2007  . DIVERTICULOSIS, COLON 12/04/2007  . DYSPHAGIA UNSPECIFIED 03/16/2008  . ERECTILE DYSFUNCTION 04/09/2007  . ESOPHAGEAL STRICTURE 05/26/2008  . GERD 12/04/2007  . HEMORRHOIDS, RECURRENT 02/11/2008  . HYPERLIPIDEMIA 12/04/2007  . HYPERTENSION 03/28/2007  . LEG PAIN, BILATERAL 01/25/2010  . LOW BACK PAIN 04/09/2007  . LUNG NODULE 12/04/2007  . Osteoarth NOS-Unspec 03/28/2007  . Other dysphagia 11/21/2009  . POLYARTHRALGIA 07/13/2008  . Polymyalgia rheumatica ([place]) 07/26/2008  . PVD WITH CLAUDICATION 02/10/2010  . RENAL INSUFFICIENCY 03/15/2010  . SLEEP APNEA, OBSTRUCTIVE, MODERATE 04/09/2007  . TB SKIN TEST, POSITIVE 03/28/2007   Past Surgical History:  Procedure Laterality Date  . ROTATOR CUFF REPAIR     Left  . TOTAL KNEE ARTHROPLASTY     x 2    reports that he has quit smoking. He has never used smokeless tobacco. He reports that he does not drink alcohol or use drugs. family history  includes Asthma in his other; Heart disease in his father; Hypertension in his brother; Lupus in his brother. Allergies  Allergen Reactions  . Ace Inhibitors Swelling    Angioedema throat  . Doxycycline     Some GI upset  . Sulfa Antibiotics Hives    And facial angioedema   Current Outpatient Prescriptions on File Prior to Visit  Medication Sig Dispense Refill  . allopurinol (ZYLOPRIM) 100 MG tablet take 1 tablet by mouth once daily 90 tablet 3  . BYSTOLIC 10 MG tablet take 2 tablets by mouth once daily 60 tablet 5  . furosemide (LASIX) 40 MG tablet 1 tab by mouth in the AM daily, with 1 by mouth in the PM as needed for swelling only 60 tablet 11  . naphazoline-pheniramine (NAPHCON-A) 0.025-0.3 % ophthalmic solution Place 1 drop into both eyes 4 (four) times daily as needed for irritation.    . tamsulosin (FLOMAX) 0.4 MG CAPS capsule take 1 capsule by mouth once daily 90 capsule 2  . tiZANidine (ZANAFLEX) 4 MG tablet Take 1 tablet (4 mg total) by mouth every 6 (six) hours as needed for muscle spasms. 60 tablet 2   No current facility-administered medications on file prior to visit.    Review of Systems  Constitutional: Negative for other unusual diaphoresis or sweats HENT: Negative for ear discharge or swelling Eyes: Negative for other worsening visual disturbances Respiratory: Negative for stridor or other swelling  Gastrointestinal: Negative for worsening distension or other blood Genitourinary: Negative for retention or other urinary change Musculoskeletal: Negative  for other MSK pain or swelling Skin: Negative for color change or other new lesions Neurological: Negative for worsening tremors and other numbness  Psychiatric/Behavioral: Negative for worsening agitation or other fatigue All other system neg per pt    Objective:   Physical Exam BP (!) 142/86   Pulse 65   Ht 5\' 10"  (1.778 m)   Wt 295 lb (133.8 kg)   SpO2 98%   BMI 42.33 kg/m  VS noted, not ill  appearing Constitutional: Pt appears in NAD HENT: Head: NCAT.  Right Ear: External ear normal.  Left Ear: External ear normal.  Eyes: . Pupils are equal, round, and reactive to light. Conjunctivae and EOM are normal Nose: without d/c or deformity Neck: Neck supple. Gross normal ROM Cardiovascular: Normal rate and regular rhythm.   Pulmonary/Chest: Effort normal and breath sounds without rales or wheezing.  bilat feet with bilat first mtp red/tender/swelling without ulcer or other foot and ankle swelling which are o/w neurovasc intact Neurological: Pt is alert. At baseline orientation, motor grossly intact Skin: Skin is warm. No rashes, other new lesions, no LE edema Psychiatric: Pt behavior is normal without agitation  No other exam findings Lab Results  Component Value Date   WBC 4.9 01/29/2017   HGB 11.9 (L) 01/29/2017   HCT 37.2 (L) 01/29/2017   PLT 273 01/29/2017   GLUCOSE 91 01/29/2017   CHOL 156 11/16/2015   TRIG 81.0 11/16/2015   HDL 45.00 11/16/2015   LDLDIRECT 156.9 10/10/2010   LDLCALC 94 11/16/2015   ALT 15 (L) 01/12/2017   AST 26 01/12/2017   NA 138 01/29/2017   K 4.2 01/29/2017   CL 107 01/29/2017   CREATININE 1.59 (H) 01/29/2017   BUN 13 01/29/2017   CO2 26 01/29/2017   TSH 1.25 11/16/2015   PSA 1.13 11/16/2015   HGBA1C 5.8 11/16/2015       Assessment & Plan:

## 2017-02-27 NOTE — Assessment & Plan Note (Signed)
Similar to previous episode, for colchicine asd, tramadol for pain,  to f/u any worsening symptoms or concerns

## 2017-02-27 NOTE — Assessment & Plan Note (Signed)
Mild elevated, possibly reactive, o/w stable overall by history and exam, recent data reviewed with pt, and pt to continue medical treatment as before,  to f/u any worsening symptoms or concerns BP Readings from Last 3 Encounters:  02/26/17 (!) 142/86  01/29/17 (!) 150/65  01/12/17 (!) 142/54

## 2017-02-27 NOTE — Assessment & Plan Note (Signed)
stable overall by history and exam, recent data reviewed with pt, and pt to continue medical treatment as before,  to f/u any worsening symptoms or concerns Lab Results  Component Value Date   HGBA1C 5.8 11/16/2015

## 2017-03-11 ENCOUNTER — Other Ambulatory Visit: Payer: Self-pay | Admitting: Internal Medicine

## 2017-03-29 ENCOUNTER — Other Ambulatory Visit: Payer: Self-pay | Admitting: Internal Medicine

## 2017-03-29 NOTE — Telephone Encounter (Signed)
Done hardcopy to [place]  

## 2017-03-29 NOTE — Telephone Encounter (Signed)
Faxed

## 2017-04-23 ENCOUNTER — Other Ambulatory Visit: Payer: Self-pay | Admitting: Internal Medicine

## 2017-05-05 ENCOUNTER — Other Ambulatory Visit: Payer: Self-pay | Admitting: Internal Medicine

## 2017-05-16 ENCOUNTER — Other Ambulatory Visit (INDEPENDENT_AMBULATORY_CARE_PROVIDER_SITE_OTHER): Payer: PPO

## 2017-05-16 ENCOUNTER — Ambulatory Visit (INDEPENDENT_AMBULATORY_CARE_PROVIDER_SITE_OTHER): Payer: PPO | Admitting: Internal Medicine

## 2017-05-16 VITALS — BP 148/86 | HR 67 | Temp 97.7°F | Ht 70.0 in | Wt 295.0 lb

## 2017-05-16 DIAGNOSIS — N401 Enlarged prostate with lower urinary tract symptoms: Secondary | ICD-10-CM | POA: Diagnosis not present

## 2017-05-16 DIAGNOSIS — Z0000 Encounter for general adult medical examination without abnormal findings: Secondary | ICD-10-CM

## 2017-05-16 DIAGNOSIS — N138 Other obstructive and reflux uropathy: Secondary | ICD-10-CM | POA: Insufficient documentation

## 2017-05-16 DIAGNOSIS — M109 Gout, unspecified: Secondary | ICD-10-CM

## 2017-05-16 DIAGNOSIS — M25571 Pain in right ankle and joints of right foot: Secondary | ICD-10-CM | POA: Diagnosis not present

## 2017-05-16 DIAGNOSIS — R7302 Impaired glucose tolerance (oral): Secondary | ICD-10-CM | POA: Diagnosis not present

## 2017-05-16 DIAGNOSIS — M25572 Pain in left ankle and joints of left foot: Secondary | ICD-10-CM | POA: Diagnosis not present

## 2017-05-16 LAB — URIC ACID: Uric Acid, Serum: 6.9 mg/dL (ref 4.0–7.8)

## 2017-05-16 MED ORDER — TAMSULOSIN HCL 0.4 MG PO CAPS: ORAL_CAPSULE | ORAL | 3 refills | Status: DC

## 2017-05-16 MED ORDER — PREDNISONE 10 MG PO TABS: ORAL_TABLET | ORAL | 0 refills | Status: DC

## 2017-05-16 NOTE — Progress Notes (Signed)
Subjective:    Patient ID: Troy Palmer, male    DOB: Nov 23, 1945, 71 y.o.   MRN: [id number]  HPI  Here to f/u, states gu symptoms worsening in past 2 wks with some decreased flow and increased nocturia now every 2-3 hours. Denies urinary symptoms such as dysuria, frequency, flank pain, hematuria or n/v, fever, chills.  Asks for increased flomax.  Pt denies chest pain, increased sob or doe, wheezing, orthopnea, PND, increased LE swelling, palpitations, dizziness or syncope.  But has flare of bilat ankle pain with increased walking, now with swelling and pain about 7/10, requests some consideration for tx of gout, no fever, giveawys or falls.   Declines labs today Past Medical History:  Diagnosis Date  . ALLERGIC RHINITIS 04/09/2007  . ASTHMA 12/04/2007  . BACK PAIN 09/16/2009  . BENIGN PROSTATIC HYPERTROPHY 04/09/2007  . CEREBROVASCULAR ACCIDENT, HX OF 07/17/2010  . COLONIC POLYPS, HX OF 12/04/2007  . CONSTIPATION 09/25/2010  . Degeneration of cervical intervertebral disc 03/28/2007  . DEPRESSION 12/04/2007  . DIVERTICULOSIS, COLON 12/04/2007  . DYSPHAGIA UNSPECIFIED 03/16/2008  . ERECTILE DYSFUNCTION 04/09/2007  . ESOPHAGEAL STRICTURE 05/26/2008  . GERD 12/04/2007  . HEMORRHOIDS, RECURRENT 02/11/2008  . HYPERLIPIDEMIA 12/04/2007  . HYPERTENSION 03/28/2007  . LEG PAIN, BILATERAL 01/25/2010  . LOW BACK PAIN 04/09/2007  . LUNG NODULE 12/04/2007  . Osteoarth NOS-Unspec 03/28/2007  . Other dysphagia 11/21/2009  . POLYARTHRALGIA 07/13/2008  . Polymyalgia rheumatica ([place]) 07/26/2008  . PVD WITH CLAUDICATION 02/10/2010  . RENAL INSUFFICIENCY 03/15/2010  . SLEEP APNEA, OBSTRUCTIVE, MODERATE 04/09/2007  . TB SKIN TEST, POSITIVE 03/28/2007   Past Surgical History:  Procedure Laterality Date  . ROTATOR CUFF REPAIR     Left  . TOTAL KNEE ARTHROPLASTY     x 2    reports that he has quit smoking. He has never used smokeless tobacco. He reports that he does not drink alcohol or use drugs. family history includes  Asthma in his other; Heart disease in his father; Hypertension in his brother; Lupus in his brother. Allergies  Allergen Reactions  . Ace Inhibitors Swelling    Angioedema throat  . Doxycycline     Some GI upset  . Sulfa Antibiotics Hives    And facial angioedema   Current Outpatient Prescriptions on File Prior to Visit  Medication Sig Dispense Refill  . allopurinol (ZYLOPRIM) 100 MG tablet take 1 tablet by mouth once daily 90 tablet 3  . amLODipine (NORVASC) 5 MG tablet take 1 tablet by mouth once daily 30 tablet 5  . atorvastatin (LIPITOR) 40 MG tablet take 1 tablet by mouth once daily 90 tablet 1  . BYSTOLIC 10 MG tablet take 2 tablets by mouth once daily 60 tablet 5  . colchicine 0.6 MG tablet Take 1 tablet (0.6 mg total) by mouth 2 (two) times daily. 60 tablet 5  . furosemide (LASIX) 40 MG tablet 1 tab by mouth in the AM daily, with 1 by mouth in the PM as needed for swelling only 60 tablet 11  . naphazoline-pheniramine (NAPHCON-A) 0.025-0.3 % ophthalmic solution Place 1 drop into both eyes 4 (four) times daily as needed for irritation.    Marland Kitchen tiZANidine (ZANAFLEX) 4 MG tablet Take 1 tablet (4 mg total) by mouth every 6 (six) hours as needed for muscle spasms. 60 tablet 2  . traMADol (ULTRAM) 50 MG tablet take 1 tablet by mouth every 8 hours if needed 30 tablet 1   No current facility-administered medications on file prior  to visit.     Review of Systems  Constitutional: Negative for other unusual diaphoresis or sweats HENT: Negative for ear discharge or swelling Eyes: Negative for other worsening visual disturbances Respiratory: Negative for stridor or other swelling  Gastrointestinal: Negative for worsening distension or other blood Genitourinary: Negative for retention or other urinary change Musculoskeletal: Negative for other MSK pain or swelling Skin: Negative for color change or other new lesions Neurological: Negative for worsening tremors and other numbness    Psychiatric/Behavioral: Negative for worsening agitation or other fatigue All other system neg per pt    Objective:   Physical Exam BP (!) 148/86   Pulse 67   Temp 97.7 F (36.5 C) (Oral)   Ht 5\' 10"  (1.778 m)   Wt 295 lb (133.8 kg)   SpO2 98%   BMI 42.33 kg/m  VS noted, not ill appaering Constitutional: Pt appears in NAD HENT: Head: NCAT.  Right Ear: External ear normal.  Left Ear: External ear normal.  Eyes: . Pupils are equal, round, and reactive to light. Conjunctivae and EOM are normal Nose: without d/c or deformity Neck: Neck supple. Gross normal ROM Cardiovascular: Normal rate and regular rhythm.   Pulmonary/Chest: Effort normal and breath sounds without rales or wheezing.  Abd:  Soft, NT, ND, + BS, no organomegaly Neurological: Pt is alert. At baseline orientation, motor grossly intact Bilat ankles tender with swelling to ankles and feet Skin: Skin is warm. No rashes, other new lesions, no LE edema Psychiatric: Pt behavior is normal without agitation  No other exam findings      Assessment & Plan:

## 2017-05-16 NOTE — Assessment & Plan Note (Signed)
Also with nocturia, recent labs neg, for increase flomax to BID, consider other BPH med or urology referral if not improved

## 2017-05-16 NOTE — Assessment & Plan Note (Signed)
Has hx of mild DJD right foot by films 2014, will check bilat ankle films, prednisone trial r/o gout, and consider sport med referral if not improved

## 2017-05-16 NOTE — Assessment & Plan Note (Signed)
Lab Results  Component Value Date   HGBA1C 5.8 11/16/2015  mild, asympt, stable overall by history and exam, recent data reviewed with pt, and pt to continue medical treatment as before,  to f/u any worsening symptoms or concerns, pt to call for onset polys or cbg > 200 with tx

## 2017-05-16 NOTE — Patient Instructions (Addendum)
Ok to increase the flomax to twice per day  Please call for urology referral if you are not improving  Please take all new medication as prescribed - the prednisone for the ankles  Please call for referral to Sports Medicine if not improving  Please continue all other medications as before, and refills have been done if requested.  Please have the pharmacy call with any other refills you may need.  Please continue your efforts at being more active, low cholesterol diet, and weight control.  Please keep your appointments with your specialists as you may have planned  Please go to the XRAY Department in the Basement (go straight as you get off the elevator) for the x-ray testing  You will be contacted by phone if any changes need to be made immediately.  Otherwise, you will receive a letter about your results with an explanation, but please check with MyChart first.  Please remember to sign up for MyChart if you have not done so, as this will be important to you in the future with finding out test results, communicating by private email, and scheduling acute appointments online when needed.  Please return in 3 months, or sooner if needed

## 2017-06-01 ENCOUNTER — Other Ambulatory Visit: Payer: Self-pay | Admitting: Internal Medicine

## 2017-08-16 ENCOUNTER — Encounter: Payer: Self-pay | Admitting: Internal Medicine

## 2017-08-16 ENCOUNTER — Other Ambulatory Visit (INDEPENDENT_AMBULATORY_CARE_PROVIDER_SITE_OTHER): Payer: PPO

## 2017-08-16 ENCOUNTER — Ambulatory Visit (INDEPENDENT_AMBULATORY_CARE_PROVIDER_SITE_OTHER): Payer: PPO | Admitting: Internal Medicine

## 2017-08-16 VITALS — BP 142/86 | HR 68 | Temp 97.7°F | Ht 70.0 in | Wt 299.0 lb

## 2017-08-16 DIAGNOSIS — N183 Chronic kidney disease, stage 3 unspecified: Secondary | ICD-10-CM

## 2017-08-16 DIAGNOSIS — J309 Allergic rhinitis, unspecified: Secondary | ICD-10-CM

## 2017-08-16 DIAGNOSIS — R7302 Impaired glucose tolerance (oral): Secondary | ICD-10-CM | POA: Diagnosis not present

## 2017-08-16 DIAGNOSIS — Z0000 Encounter for general adult medical examination without abnormal findings: Secondary | ICD-10-CM

## 2017-08-16 DIAGNOSIS — K219 Gastro-esophageal reflux disease without esophagitis: Secondary | ICD-10-CM

## 2017-08-16 LAB — LIPID PANEL
CHOLESTEROL: 147 mg/dL (ref 0–200)
HDL: 40 mg/dL (ref >39.00)
LDL Cholesterol: 93 mg/dL (ref 0–99)
NonHDL: 106.51
TRIGLYCERIDES: 69 mg/dL (ref 0.0–149.0)
Total CHOL/HDL Ratio: 4
VLDL: 13.8 mg/dL (ref 0.0–40.0)

## 2017-08-16 LAB — URINALYSIS, ROUTINE W REFLEX MICROSCOPIC
Bilirubin Urine: NEGATIVE
HGB URINE DIPSTICK: NEGATIVE
Ketones, ur: NEGATIVE
Leukocytes, UA: NEGATIVE
Nitrite: NEGATIVE
RBC / HPF: NONE SEEN (ref 0–?)
SPECIFIC GRAVITY, URINE: 1.01 (ref 1.000–1.030)
Total Protein, Urine: 30 — AB
Urine Glucose: NEGATIVE
Urobilinogen, UA: 0.2 (ref 0.0–1.0)
WBC UA: NONE SEEN (ref 0–?)
pH: 5.5 (ref 5.0–8.0)

## 2017-08-16 LAB — BASIC METABOLIC PANEL
BUN: 26 mg/dL — ABNORMAL HIGH (ref 6–23)
CO2: 30 mEq/L (ref 19–32)
Calcium: 8.7 mg/dL (ref 8.4–10.5)
Chloride: 107 mEq/L (ref 96–112)
Creatinine, Ser: 1.93 mg/dL — ABNORMAL HIGH (ref 0.40–1.50)
GFR: 44.34 mL/min — AB (ref >60.00)
Glucose, Bld: 89 mg/dL (ref 70–99)
POTASSIUM: 4 meq/L (ref 3.5–5.1)
SODIUM: 143 meq/L (ref 135–145)

## 2017-08-16 LAB — HEPATIC FUNCTION PANEL
ALBUMIN: 3.3 g/dL — AB (ref 3.5–5.2)
ALT: 15 U/L (ref 0–53)
AST: 16 U/L (ref 0–37)
Alkaline Phosphatase: 131 U/L — ABNORMAL HIGH (ref 39–117)
Bilirubin, Direct: 0.1 mg/dL (ref 0.0–0.3)
TOTAL PROTEIN: 6.2 g/dL (ref 6.0–8.3)
Total Bilirubin: 0.6 mg/dL (ref 0.2–1.2)

## 2017-08-16 LAB — CBC WITH DIFFERENTIAL/PLATELET
Basophils Absolute: 0 10*3/uL (ref 0.0–0.1)
Basophils Relative: 0.7 % (ref 0.0–3.0)
EOS PCT: 4.3 % (ref 0.0–5.0)
Eosinophils Absolute: 0.2 10*3/uL (ref 0.0–0.7)
HEMATOCRIT: 40.1 % (ref 39.0–52.0)
HEMOGLOBIN: 12.8 g/dL — AB (ref 13.0–17.0)
LYMPHS PCT: 26.4 % (ref 12.0–46.0)
Lymphs Abs: 1.3 10*3/uL (ref 0.7–4.0)
MCHC: 31.9 g/dL (ref 30.0–36.0)
MCV: 84.6 fl (ref 78.0–100.0)
Monocytes Absolute: 0.9 10*3/uL (ref 0.1–1.0)
Monocytes Relative: 17.6 % — ABNORMAL HIGH (ref 3.0–12.0)
Neutro Abs: 2.5 10*3/uL (ref 1.4–7.7)
Neutrophils Relative %: 51 % (ref 43.0–77.0)
Platelets: 210 10*3/uL (ref 150.0–400.0)
RBC: 4.73 Mil/uL (ref 4.22–5.81)
RDW: 13.5 % (ref 11.5–15.5)
WBC: 5 10*3/uL (ref 4.0–10.5)

## 2017-08-16 LAB — PSA: PSA: 0.98 ng/mL (ref 0.10–4.00)

## 2017-08-16 LAB — TSH: TSH: 2.09 u[IU]/mL (ref 0.35–4.50)

## 2017-08-16 LAB — HEMOGLOBIN A1C: HEMOGLOBIN A1C: 5.7 % (ref 4.6–6.5)

## 2017-08-16 MED ORDER — METHYLPREDNISOLONE ACETATE 80 MG/ML IJ SUSP
80.0000 mg | Freq: Once | INTRAMUSCULAR | Status: AC
  Administered 2017-08-16: 80 mg via INTRAMUSCULAR

## 2017-08-16 MED ORDER — PANTOPRAZOLE SODIUM 40 MG PO TBEC
40.0000 mg | DELAYED_RELEASE_TABLET | Freq: Every day | ORAL | 3 refills | Status: DC
Start: 2017-08-16 — End: 2018-08-18

## 2017-08-16 NOTE — Progress Notes (Signed)
Subjective:    Patient ID: Troy Palmer, male    DOB: 03/04/1946, 72 y.o.   MRN: [id number]  HPI Here for wellness and f/u;  Overall doing ok;  Pt denies Chest pain, worsening SOB, DOE, wheezing, orthopnea, PND, worsening LE edema, palpitations, dizziness or syncope.  Pt denies neurological change such as new headache, facial or extremity weakness.  Pt denies polydipsia, polyuria, or low sugar symptoms. Pt states overall good compliance with treatment and medications, good tolerability, and has been trying to follow appropriate diet.  Pt denies worsening depressive symptoms, suicidal ideation or panic. No fever, night sweats, wt loss, loss of appetite, or other constitutional symptoms.  Pt states good ability with ADL's, has low fall risk, home safety reviewed and adequate, no other significant changes in hearing or vision, and not active with exercise. Declines flu shot. Does also have several wks ongoing nasal allergy symptoms with clearish congestion, itch and sneezing, without fever, pain, ST, cough, swelling or wheezing. Also has had mild worsening reflux, vague upper abd pain, but no dysphagia, n/v, bowel change or blood.  Appetite ok, no wt loss, in fact has gained wt.  Had diffuculty tolerating TUMS but did work for a short time [place] from Last 3 Encounters:  08/16/17 299 lb (135.6 kg)  05/16/17 295 lb (133.8 kg)  02/26/17 295 lb (133.8 kg)   Past Medical History:  Diagnosis Date  . ALLERGIC RHINITIS 04/09/2007  . ASTHMA 12/04/2007  . BACK PAIN 09/16/2009  . BENIGN PROSTATIC HYPERTROPHY 04/09/2007  . CEREBROVASCULAR ACCIDENT, HX OF 07/17/2010  . COLONIC POLYPS, HX OF 12/04/2007  . CONSTIPATION 09/25/2010  . Degeneration of cervical intervertebral disc 03/28/2007  . DEPRESSION 12/04/2007  . DIVERTICULOSIS, COLON 12/04/2007  . DYSPHAGIA UNSPECIFIED 03/16/2008  . ERECTILE DYSFUNCTION 04/09/2007  . ESOPHAGEAL STRICTURE 05/26/2008  . GERD 12/04/2007  . HEMORRHOIDS, RECURRENT 02/11/2008  .  HYPERLIPIDEMIA 12/04/2007  . HYPERTENSION 03/28/2007  . LEG PAIN, BILATERAL 01/25/2010  . LOW BACK PAIN 04/09/2007  . LUNG NODULE 12/04/2007  . Osteoarth NOS-Unspec 03/28/2007  . Other dysphagia 11/21/2009  . POLYARTHRALGIA 07/13/2008  . Polymyalgia rheumatica ([place]) 07/26/2008  . PVD WITH CLAUDICATION 02/10/2010  . RENAL INSUFFICIENCY 03/15/2010  . SLEEP APNEA, OBSTRUCTIVE, MODERATE 04/09/2007  . TB SKIN TEST, POSITIVE 03/28/2007   Past Surgical History:  Procedure Laterality Date  . ROTATOR CUFF REPAIR     Left  . TOTAL KNEE ARTHROPLASTY     x 2    reports that he has quit smoking. he has never used smokeless tobacco. He reports that he does not drink alcohol or use drugs. family history includes Asthma in his other; Heart disease in his father; Hypertension in his brother; Lupus in his brother. Allergies  Allergen Reactions  . Ace Inhibitors Swelling    Angioedema throat  . Doxycycline     Some GI upset  . Sulfa Antibiotics Hives    And facial angioedema   Current Outpatient Medications on File Prior to Visit  Medication Sig Dispense Refill  . allopurinol (ZYLOPRIM) 100 MG tablet take 1 tablet by mouth once daily 90 tablet 3  . amLODipine (NORVASC) 5 MG tablet take 1 tablet by mouth once daily 30 tablet 5  . atorvastatin (LIPITOR) 40 MG tablet take 1 tablet by mouth once daily 90 tablet 1  . BYSTOLIC 10 MG tablet take 2 tablets by mouth once daily 60 tablet 5  . colchicine 0.6 MG tablet Take 1 tablet (0.6 mg total) by mouth  2 (two) times daily. 60 tablet 5  . furosemide (LASIX) 40 MG tablet take 1 tablet by mouth every morning AND 1 IN THE EVENING AS NEEDED FOR SWELLING ONLY 60 tablet 11  . naphazoline-pheniramine (NAPHCON-A) 0.025-0.3 % ophthalmic solution Place 1 drop into both eyes 4 (four) times daily as needed for irritation.    . tamsulosin (FLOMAX) 0.4 MG CAPS capsule 1 tab by mouth twice per day 180 capsule 3  . tiZANidine (ZANAFLEX) 4 MG tablet Take 1 tablet (4 mg total) by  mouth every 6 (six) hours as needed for muscle spasms. 60 tablet 2  . traMADol (ULTRAM) 50 MG tablet take 1 tablet by mouth every 8 hours if needed 30 tablet 1   No current facility-administered medications on file prior to visit.    Review of Systems Constitutional: Negative for other unusual diaphoresis, sweats, appetite or weight changes HENT: Negative for other worsening hearing loss, ear pain, facial swelling, mouth sores or neck stiffness.   Eyes: Negative for other worsening pain, redness or other visual disturbance.  Respiratory: Negative for other stridor or swelling Cardiovascular: Negative for other palpitations or other chest pain  Gastrointestinal: Negative for worsening diarrhea or loose stools, blood in stool, distention or other pain Genitourinary: Negative for hematuria, flank pain or other change in urine volume.  Musculoskeletal: Negative for myalgias or other joint swelling.  Skin: Negative for other color change, or other wound or worsening drainage.  Neurological: Negative for other syncope or numbness. Hematological: Negative for other adenopathy or swelling Psychiatric/Behavioral: Negative for hallucinations, other worsening agitation, SI, self-injury, or new decreased concentration All other system neg per pt    Objective:   Physical Exam BP (!) 142/86   Pulse 68   Temp 97.7 F (36.5 C) (Oral)   Ht 5\' 10"  (1.778 m)   Wt 299 lb (135.6 kg)   SpO2 98%   BMI 42.90 kg/m  VS noted,  Constitutional: Pt is oriented to person, place, and time. Appears well-developed and well-nourished, in no significant distress and comfortable Head: Normocephalic and atraumatic  Eyes: Conjunctivae and EOM are normal. Pupils are equal, round, and reactive to light Right Ear: External ear normal without discharge Left Ear: External ear normal without discharge Nose: Nose without discharge or deformity Bilat tm's with mild erythema.  Max sinus areas non tender.  Pharynx with mild  erythema, no exudate Mouth/Throat: Oropharynx is without other ulcerations and moist  Neck: Normal range of motion. Neck supple. No JVD present. No tracheal deviation present or significant neck LA or mass Cardiovascular: Normal rate, regular rhythm, normal heart sounds and intact distal pulses.   Pulmonary/Chest: WOB normal and breath sounds without rales or wheezing  Abdominal: Soft. Bowel sounds are normal. NT. No HSM  Musculoskeletal: Normal range of motion. Exhibits no edema Lymphadenopathy: Has no other cervical adenopathy.  Neurological: Pt is alert and oriented to person, place, and time. Pt has normal reflexes. No cranial nerve deficit. Motor grossly intact, Gait intact Skin: Skin is warm and dry. No rash noted or new ulcerations Psychiatric:  Has normal mood and affect. Behavior is normal without agitation No other exam findings    Assessment & Plan:

## 2017-08-16 NOTE — Assessment & Plan Note (Signed)
For depomedrol IM 80 x 1

## 2017-08-16 NOTE — Patient Instructions (Signed)
You had the steroid shot today  Please take all new medication as prescribed - the protonix for stomach acid  Please continue all other medications as before, and refills have been done if requested.  Please have the pharmacy call with any other refills you may need.  Please continue your efforts at being more active, low cholesterol diet, and weight control.  You are otherwise up to date with prevention measures today.  Please keep your appointments with your specialists as you may have planned  Please go to the LAB in the Basement (turn left off the elevator) for the tests to be done today  You will be contacted by phone if any changes need to be made immediately.  Otherwise, you will receive a letter about your results with an explanation, but please check with MyChart first.  Please remember to sign up for MyChart if you have not done so, as this will be important to you in the future with finding out test results, communicating by private email, and scheduling acute appointments online when needed.  Please return in 6 months, or sooner if needed

## 2017-08-16 NOTE — Assessment & Plan Note (Signed)

## 2017-08-16 NOTE — Assessment & Plan Note (Signed)
Asympt, for a1c with labs

## 2017-08-16 NOTE — Assessment & Plan Note (Signed)
Mild, to start PPI,  to f/u any worsening symptoms or concerns

## 2017-08-16 NOTE — Assessment & Plan Note (Signed)
Recently stable, plans to f/u with renal in Feb 2019

## 2017-08-21 NOTE — Progress Notes (Addendum)
Subjective:   Troy Palmer is a 72 y.o. male who presents for an Initial Medicare Annual Wellness Visit.  Review of Systems  No ROS.  Medicare Wellness Visit. Additional risk factors are reflected in the social history.  Cardiac Risk Factors include: dyslipidemia;hypertension;male gender;obesity (BMI >30kg/m2) Sleep patterns: has interrupted sleep, gets up 1-3 times nightly to void and sleeps 5-6 hours nightly. Patient reports insomnia issues, discussed recommended sleep tips and stress reduction tips, education was attached to patient's AVS.  Home Safety/Smoke Alarms: Feels safe in home. Smoke alarms in place.  Living environment; residence and Firearm Safety: apartment, no firearms Lives with grand-daughter, no needs for DME, good support system Seat Belt Safety/Bike Helmet: Wears seat belt.    Objective:    Today's Vitals   08/22/17 0959 08/22/17 1031  BP: (!) 142/64   Pulse: (!) 58   Resp: 20   SpO2: 99%   Weight: (!) 301 lb (136.5 kg)   Height: 5\' 10"  (1.778 m)   PainSc:  3    Body mass index is 43.19 kg/m.  Advanced Directives 08/22/2017 01/12/2017 12/17/2016  Does Patient Have a Medical Advance Directive? No No No  Would patient like information on creating a medical advance directive? Yes (ED - Information included in AVS) No - Patient declined -    Current Medications (verified) Outpatient Encounter Medications as of 08/22/2017  Medication Sig  . allopurinol (ZYLOPRIM) 100 MG tablet take 1 tablet by mouth once daily  . amLODipine (NORVASC) 5 MG tablet take 1 tablet by mouth once daily  . atorvastatin (LIPITOR) 40 MG tablet take 1 tablet by mouth once daily  . BYSTOLIC 10 MG tablet take 2 tablets by mouth once daily  . colchicine 0.6 MG tablet Take 1 tablet (0.6 mg total) by mouth 2 (two) times daily.  . furosemide (LASIX) 40 MG tablet take 1 tablet by mouth every morning AND 1 IN THE EVENING AS NEEDED FOR SWELLING ONLY  . naphazoline-pheniramine (NAPHCON-A)  0.025-0.3 % ophthalmic solution Place 1 drop into both eyes 4 (four) times daily as needed for irritation.  . pantoprazole (PROTONIX) 40 MG tablet Take 1 tablet (40 mg total) by mouth daily.  . tamsulosin (FLOMAX) 0.4 MG CAPS capsule 1 tab by mouth twice per day  . [DISCONTINUED] tiZANidine (ZANAFLEX) 4 MG tablet Take 1 tablet (4 mg total) by mouth every 6 (six) hours as needed for muscle spasms. (Patient not taking: Reported on 08/22/2017)  . [DISCONTINUED] traMADol (ULTRAM) 50 MG tablet take 1 tablet by mouth every 8 hours if needed (Patient not taking: Reported on 08/22/2017)   No facility-administered encounter medications on file as of 08/22/2017.     Allergies (verified) Ace inhibitors; Doxycycline; and Sulfa antibiotics   History: Past Medical History:  Diagnosis Date  . ALLERGIC RHINITIS 04/09/2007  . ASTHMA 12/04/2007  . BACK PAIN 09/16/2009  . BENIGN PROSTATIC HYPERTROPHY 04/09/2007  . CEREBROVASCULAR ACCIDENT, HX OF 07/17/2010  . COLONIC POLYPS, HX OF 12/04/2007  . CONSTIPATION 09/25/2010  . Degeneration of cervical intervertebral disc 03/28/2007  . DEPRESSION 12/04/2007  . DIVERTICULOSIS, COLON 12/04/2007  . DYSPHAGIA UNSPECIFIED 03/16/2008  . ERECTILE DYSFUNCTION 04/09/2007  . ESOPHAGEAL STRICTURE 05/26/2008  . GERD 12/04/2007  . HEMORRHOIDS, RECURRENT 02/11/2008  . HYPERLIPIDEMIA 12/04/2007  . HYPERTENSION 03/28/2007  . LEG PAIN, BILATERAL 01/25/2010  . LOW BACK PAIN 04/09/2007  . LUNG NODULE 12/04/2007  . Osteoarth NOS-Unspec 03/28/2007  . Other dysphagia 11/21/2009  . POLYARTHRALGIA 07/13/2008  . Polymyalgia rheumatica (  [place]) 07/26/2008  . PVD WITH CLAUDICATION 02/10/2010  . RENAL INSUFFICIENCY 03/15/2010  . SLEEP APNEA, OBSTRUCTIVE, MODERATE 04/09/2007  . TB SKIN TEST, POSITIVE 03/28/2007   Past Surgical History:  Procedure Laterality Date  . ROTATOR CUFF REPAIR     Left  . TOTAL KNEE ARTHROPLASTY     x 2   Family History  Problem Relation Age of Onset  . Heart disease Father   .  Lupus Brother   . Hypertension Brother   . Asthma Other    Social History   Socioeconomic History  . Marital status: Widowed    Spouse name: None  . Number of children: 6  . Years of education: None  . Highest education level: None  Social Needs  . Financial resource strain: Not very hard  . Food insecurity - worry: Sometimes true  . Food insecurity - inability: Sometimes true  . Transportation needs - medical: No  . Transportation needs - non-medical: No  Occupational History  . Occupation: Oncologist  Tobacco Use  . Smoking status: Former Research scientist (life sciences)  . Smokeless tobacco: Never Used  Substance and Sexual Activity  . Alcohol use: No  . Drug use: No  . Sexual activity: Not Currently  Other Topics Concern  . None  Social History Narrative   Patient does not get regular exercise.   6 children - 1 died with suicide, 1 with homicide   Tobacco Counseling Counseling given: Not Answered  Activities of Daily Living In your present state of health, do you have any difficulty performing the following activities: 08/22/2017  Hearing? N  Vision? N  Difficulty concentrating or making decisions? N  Walking or climbing stairs? N  Dressing or bathing? N  Doing errands, shopping? N  Preparing Food and eating ? N  Using the Toilet? N  In the past six months, have you accidently leaked urine? N  Do you have problems with loss of bowel control? N  Managing your Medications? N  Managing your Finances? N  Housekeeping or managing your Housekeeping? N  Some recent data might be hidden     Immunizations and Health Maintenance Immunization History  Administered Date(s) Administered  . Influenza Whole 05/26/2008, 05/21/2012, 05/11/2013  . Influenza-Unspecified 05/29/2014  . Pneumococcal Conjugate-13 10/15/2013  . Pneumococcal Polysaccharide-23 10/15/2011  . Tetanus 10/14/2012   There are no preventive care reminders to display for this patient.  Patient Care  Team: Biagio Borg, MD as PCP - General  Indicate any recent Medical Services you may have received from other than [facility] providers in the past year (date may be approximate).    Assessment:   This is a routine wellness examination for [facility]. Physical assessment deferred to PCP.   Hearing/Vision screen  Visual Acuity Screening   Right eye Left eye Both eyes  Without correction:   20/32  With correction:     Comments: Unable to afford eye exam, Medicare does not pay. [employer] provided.  Hearing Screening Comments: Able to hear conversational tones w/o difficulty. No issues reported.  Passed whisper test  Dietary issues and exercise activities discussed: Current Exercise Habits: The patient does not participate in regular exercise at present(plans to join [employer]), Exercise limited by: orthopedic condition(s)  Diet (meal preparation, eat out, water intake, caffeinated beverages, dairy products, fruits and vegetables): in general, a "healthy" diet  , on average, 2 meals per day   Reviewed heart healthy diet, discussed using supplements and trying to not  skip meals, encouraged patient to increase daily water intake. Diet education was attached to patient's AVS.  Goals    . Patient Stated     Loose weight by starting to eat healthier, increase vegetables, join [employer], increase water intake.       Depression Screen PHQ 2/9 Scores 08/22/2017 08/16/2017 11/16/2016 11/16/2015  PHQ - 2 Score 2 0 0 0  PHQ- 9 Score 5 0 - -    Fall Risk Fall Risk  08/22/2017 08/16/2017 11/16/2016 11/16/2015 10/15/2013  Falls in the past year? No No No No No   Cognitive Function:       Ad8 score reviewed for issues:  Issues making decisions: no  Less interest in hobbies / activities: no  Repeats questions, stories (family complaining): no  Trouble using ordinary gadgets (microwave, computer, phone):no  Forgets the month or year: no  Mismanaging finances:  no  Remembering appts: no  Daily problems with thinking and/or memory: yes Ad8 score is= 1  Screening Tests Health Maintenance  Topic Date Due  . INFLUENZA VACCINE  11/11/2017 (Originally 03/13/2017)  . COLONOSCOPY  10/29/2020  . TETANUS/TDAP  10/15/2022  . Hepatitis C Screening  Completed  . PNA vac Low Risk Adult  Completed      Plan:    Continue doing brain stimulating activities (puzzles, reading, adult coloring books, staying active) to keep memory sharp.   Continue to eat heart healthy diet (full of fruits, vegetables, whole grains, lean protein, water--limit salt, fat, and sugar intake) and increase physical activity as tolerated.  I have personally reviewed and noted the following in the patient's chart:   . Medical and social history . Use of alcohol, tobacco or illicit drugs  . Current medications and supplements . Functional ability and status . Nutritional status . Physical activity . Advanced directives . List of other physicians . Vitals . Screenings to include cognitive, depression, and falls . Referrals and appointments  In addition, I have reviewed and discussed with patient certain preventive protocols, quality metrics, and best practice recommendations. A written personalized care plan for preventive services as well as general preventive health recommendations were provided to patient.     Michiel Cowboy, RN   08/22/2017   Medical screening examination/treatment/procedure(s) were performed by non-physician practitioner and as supervising physician I was immediately available for consultation/collaboration. I agree with above. Cathlean Cower, MD

## 2017-08-22 ENCOUNTER — Ambulatory Visit (INDEPENDENT_AMBULATORY_CARE_PROVIDER_SITE_OTHER): Payer: PPO | Admitting: *Deleted

## 2017-08-22 VITALS — BP 142/64 | HR 58 | Resp 20 | Ht 70.0 in | Wt 301.0 lb

## 2017-08-22 DIAGNOSIS — Z0000 Encounter for general adult medical examination without abnormal findings: Secondary | ICD-10-CM | POA: Diagnosis not present

## 2017-08-22 NOTE — Patient Instructions (Addendum)
Kai Levins website or phone app. Provides resources within your zip code area.  [facility] in [place], [place] in: [place] Address: [address], [place], [place] [address] Hours:  Open ? Closes 7PM Phone: [phone]  Continue doing brain stimulating activities (puzzles, reading, adult coloring books, staying active) to keep memory sharp.   Continue to eat heart healthy diet (full of fruits, vegetables, whole grains, lean protein, water--limit salt, fat, and sugar intake) and increase physical activity as tolerated.   [place]  [address] [place], [place] [address]  If you need help with the cost of eyeglasses or eye exams contact Carrolyn Leigh with social services at [phone].  If you qualify for assistance, Ms. Amedeo Plenty will refer your name to one of the local participating [facility] that provide such assistance.  Insomnia Insomnia is a sleep disorder that makes it difficult to fall asleep or to stay asleep. Insomnia can cause tiredness (fatigue), low energy, difficulty concentrating, mood swings, and poor performance at work or school. There are three different ways to classify insomnia:  Difficulty falling asleep.  Difficulty staying asleep.  Waking up too early in the morning.  Any type of insomnia can be long-term (chronic) or short-term (acute). Both are common. Short-term insomnia usually lasts for three months or less. Chronic insomnia occurs at least three times a week for longer than three months. What are the causes? Insomnia may be caused by another condition, situation, or substance, such as:  Anxiety.  Certain medicines.  Gastroesophageal reflux disease (GERD) or other gastrointestinal conditions.  Asthma or other breathing conditions.  Restless legs syndrome, sleep apnea, or other sleep disorders.  Chronic pain.  Menopause. This may include hot flashes.  Stroke.  Abuse of  alcohol, tobacco, or illegal drugs.  Depression.  Caffeine.  Neurological disorders, such as Alzheimer disease.  An overactive thyroid (hyperthyroidism).  The cause of insomnia may not be known. What increases the risk? Risk factors for insomnia include:  Gender. Women are more commonly affected than men.  Age. Insomnia is more common as you get older.  Stress. This may involve your professional or personal life.  Income. Insomnia is more common in people with lower income.  Lack of exercise.  Irregular work schedule or night shifts.  Traveling between different time zones.  What are the signs or symptoms? If you have insomnia, trouble falling asleep or trouble staying asleep is the main symptom. This may lead to other symptoms, such as:  Feeling fatigued.  Feeling nervous about going to sleep.  Not feeling rested in the morning.  Having trouble concentrating.  Feeling irritable, anxious, or depressed.  How is this treated? Treatment for insomnia depends on the cause. If your insomnia is caused by an underlying condition, treatment will focus on addressing the condition. Treatment may also include:  Medicines to help you sleep.  Counseling or therapy.  Lifestyle adjustments.  Follow these instructions at home:  Take medicines only as directed by your health care provider.  Keep regular sleeping and waking hours. Avoid naps.  Keep a sleep diary to help you and your health care provider figure out what could be causing your insomnia. Include: ? When you sleep. ? When you wake up during the night. ? How well you sleep. ? How rested you feel the next day. ? Any side effects of medicines you are taking. ? What you eat and drink.  Make your bedroom  a comfortable place where it is easy to fall asleep: ? Put up shades or special blackout curtains to block light from outside. ? Use a white noise machine to block noise. ? Keep the temperature cool.  Exercise  regularly as directed by your health care provider. Avoid exercising right before bedtime.  Use relaxation techniques to manage stress. Ask your health care provider to suggest some techniques that may work well for you. These may include: ? Breathing exercises. ? Routines to release muscle tension. ? Visualizing peaceful scenes.  Cut back on alcohol, caffeinated beverages, and cigarettes, especially close to bedtime. These can disrupt your sleep.  Do not overeat or eat spicy foods right before bedtime. This can lead to digestive discomfort that can make it hard for you to sleep.  Limit screen use before bedtime. This includes: ? Watching TV. ? Using your smartphone, tablet, and computer.  Stick to a routine. This can help you fall asleep faster. Try to do a quiet activity, brush your teeth, and go to bed at the same time each night.  Get out of bed if you are still awake after 15 minutes of trying to sleep. Keep the lights down, but try reading or doing a quiet activity. When you feel sleepy, go back to bed.  Make sure that you drive carefully. Avoid driving if you feel very sleepy.  Keep all follow-up appointments as directed by your health care provider. This is important. Contact a health care provider if:  You are tired throughout the day or have trouble in your daily routine due to sleepiness.  You continue to have sleep problems or your sleep problems get worse. Get help right away if:  You have serious thoughts about hurting yourself or someone else. This information is not intended to replace advice given to you by your health care provider. Make sure you discuss any questions you have with your health care provider. Document Released: 07/27/2000 Document Revised: 12/30/2015 Document Reviewed: 04/30/2014 Elsevier Interactive Patient Education  2018 [place] Eating Plan DASH stands for "Dietary Approaches to Stop Hypertension." The DASH eating plan is a  healthy eating plan that has been shown to reduce high blood pressure (hypertension). It may also reduce your risk for type 2 diabetes, heart disease, and stroke. The DASH eating plan may also help with weight loss. What are tips for following this plan? General guidelines  Avoid eating more than 2,300 mg (milligrams) of salt (sodium) a day. If you have hypertension, you may need to reduce your sodium intake to 1,500 mg a day.  Limit alcohol intake to no more than 1 drink a day for nonpregnant women and 2 drinks a day for men. One drink equals 12 oz of beer, 5 oz of Jakaylee Sasaki, or 1 oz of hard liquor.  Work with your health care provider to maintain a healthy body weight or to lose weight. Ask what an ideal weight is for you.  Get at least 30 minutes of exercise that causes your heart to beat faster (aerobic exercise) most days of the week. Activities may include walking, swimming, or biking.  Work with your health care provider or diet and nutrition specialist (dietitian) to adjust your eating plan to your individual calorie needs. Reading food labels  Check food labels for the amount of sodium per serving. Choose foods with less than 5 percent of the Daily Value of sodium. Generally, foods with less than 300 mg of sodium per serving fit into  this eating plan.  To find whole grains, look for the word "whole" as the first word in the ingredient list. Shopping  Buy products labeled as "low-sodium" or "no salt added."  Buy fresh foods. Avoid canned foods and premade or frozen meals. Cooking  Avoid adding salt when cooking. Use salt-free seasonings or herbs instead of table salt or sea salt. Check with your health care provider or pharmacist before using salt substitutes.  Do not fry foods. Cook foods using healthy methods such as baking, boiling, grilling, and broiling instead.  Cook with heart-healthy oils, such as olive, canola, soybean, or sunflower oil. Meal planning   Eat a balanced  diet that includes: ? 5 or more servings of fruits and vegetables each day. At each meal, try to fill half of your plate with fruits and vegetables. ? Up to 6-8 servings of whole grains each day. ? Less than 6 oz of lean meat, poultry, or fish each day. A 3-oz serving of meat is about the same size as a deck of cards. One egg equals 1 oz. ? 2 servings of low-fat dairy each day. ? A serving of nuts, seeds, or beans 5 times each week. ? Heart-healthy fats. Healthy fats called Omega-3 fatty acids are found in foods such as flaxseeds and coldwater fish, like sardines, salmon, and mackerel.  Limit how much you eat of the following: ? Canned or prepackaged foods. ? Food that is high in trans fat, such as fried foods. ? Food that is high in saturated fat, such as fatty meat. ? Sweets, desserts, sugary drinks, and other foods with added sugar. ? Full-fat dairy products.  Do not salt foods before eating.  Try to eat at least 2 vegetarian meals each week.  Eat more home-cooked food and less restaurant, buffet, and fast food.  When eating at a restaurant, ask that your food be prepared with less salt or no salt, if possible. What foods are recommended? The items listed may not be a complete list. Talk with your dietitian about what dietary choices are best for you. Grains Whole-grain or whole-wheat bread. Whole-grain or whole-wheat pasta. Brown rice. Modena Morrow. Bulgur. Whole-grain and low-sodium cereals. Pita bread. Low-fat, low-sodium crackers. Whole-wheat flour tortillas. Vegetables Fresh or frozen vegetables (raw, steamed, roasted, or grilled). Low-sodium or reduced-sodium tomato and vegetable juice. Low-sodium or reduced-sodium tomato sauce and tomato paste. Low-sodium or reduced-sodium canned vegetables. Fruits All fresh, dried, or frozen fruit. Canned fruit in natural juice (without added sugar). Meat and other protein foods Skinless chicken or [place]. Ground chicken or [place]. Pork  with fat trimmed off. Fish and seafood. Egg whites. Dried beans, peas, or lentils. Unsalted nuts, nut butters, and seeds. Unsalted canned beans. Lean cuts of beef with fat trimmed off. Low-sodium, lean deli meat. Dairy Low-fat (1%) or fat-free (skim) milk. Fat-free, low-fat, or reduced-fat cheeses. Nonfat, low-sodium ricotta or cottage cheese. Low-fat or nonfat yogurt. Low-fat, low-sodium cheese. Fats and oils Soft margarine without trans fats. Vegetable oil. Low-fat, reduced-fat, or light mayonnaise and salad dressings (reduced-sodium). Canola, safflower, olive, soybean, and sunflower oils. Avocado. Seasoning and other foods Herbs. Spices. Seasoning mixes without salt. Unsalted popcorn and pretzels. Fat-free sweets. What foods are not recommended? The items listed may not be a complete list. Talk with your dietitian about what dietary choices are best for you. Grains Baked goods made with fat, such as croissants, muffins, or some breads. Dry pasta or rice meal packs. Vegetables Creamed or fried vegetables. Vegetables in a cheese  sauce. Regular canned vegetables (not low-sodium or reduced-sodium). Regular canned tomato sauce and paste (not low-sodium or reduced-sodium). Regular tomato and vegetable juice (not low-sodium or reduced-sodium). Angie Fava. Olives. Fruits Canned fruit in a light or heavy syrup. Fried fruit. Fruit in cream or butter sauce. Meat and other protein foods Fatty cuts of meat. Ribs. Fried meat. Berniece Salines. Sausage. Bologna and other processed lunch meats. Salami. Fatback. Hotdogs. Bratwurst. Salted nuts and seeds. Canned beans with added salt. Canned or smoked fish. Whole eggs or egg yolks. Chicken or [place] with skin. Dairy Whole or 2% milk, cream, and half-and-half. Whole or full-fat cream cheese. Whole-fat or sweetened yogurt. Full-fat cheese. Nondairy creamers. Whipped toppings. Processed cheese and cheese spreads. Fats and oils Butter. Stick margarine. Lard. Shortening. Ghee.  Bacon fat. Tropical oils, such as coconut, palm kernel, or palm oil. Seasoning and other foods Salted popcorn and pretzels. Onion salt, garlic salt, seasoned salt, table salt, and sea salt. Worcestershire sauce. Tartar sauce. Barbecue sauce. Teriyaki sauce. Soy sauce, including reduced-sodium. Steak sauce. Canned and packaged gravies. Fish sauce. Oyster sauce. Cocktail sauce. Horseradish that you find on the shelf. Ketchup. Mustard. Meat flavorings and tenderizers. Bouillon cubes. Hot sauce and Tabasco sauce. Premade or packaged marinades. Premade or packaged taco seasonings. Relishes. Regular salad dressings. Where to find more information:  National Heart, Lung, and [place]: https://wilson-eaton.com/  American Heart Association: www.heart.org Summary  The DASH eating plan is a healthy eating plan that has been shown to reduce high blood pressure (hypertension). It may also reduce your risk for type 2 diabetes, heart disease, and stroke.  With the DASH eating plan, you should limit salt (sodium) intake to 2,300 mg a day. If you have hypertension, you may need to reduce your sodium intake to 1,500 mg a day.  When on the DASH eating plan, aim to eat more fresh fruits and vegetables, whole grains, lean proteins, low-fat dairy, and heart-healthy fats.  Work with your health care provider or diet and nutrition specialist (dietitian) to adjust your eating plan to your individual calorie needs. This information is not intended to replace advice given to you by your health care provider. Make sure you discuss any questions you have with your health care provider. Document Released: 07/19/2011 Document Revised: 07/23/2016 Document Reviewed: 07/23/2016 Elsevier Interactive Patient Education  2018 [employer].  Fat and Cholesterol Restricted Diet Getting too much fat and cholesterol in your diet may cause health problems. Following this diet helps keep your fat and cholesterol at normal levels. This  can keep you from getting sick. What types of fat should I choose?  Choose monosaturated and polyunsaturated fats. These are found in foods such as olive oil, canola oil, flaxseeds, walnuts, almonds, and seeds.  Eat more omega-3 fats. Good choices include salmon, mackerel, sardines, tuna, flaxseed oil, and ground flaxseeds.  Limit saturated fats. These are in animal products such as meats, butter, and cream. They can also be in plant products such as palm oil, palm kernel oil, and coconut oil.  Avoid foods with partially hydrogenated oils in them. These contain trans fats. Examples of foods that have trans fats are stick margarine, some tub margarines, cookies, crackers, and other baked goods. What general guidelines do I need to follow?  Check food labels. Look for the words "trans fat" and "saturated fat."  When preparing a meal: ? Fill half of your plate with vegetables and green salads. ? Fill one fourth of your plate with whole grains. Look for the word "whole"  as the first word in the ingredient list. ? Fill one fourth of your plate with lean protein foods.  Eat more foods that have fiber, like apples, carrots, beans, peas, and barley.  Eat more home-cooked foods. Eat less at restaurants and buffets.  Limit or avoid alcohol.  Limit foods high in starch and sugar.  Limit fried foods.  Cook foods without frying them. Baking, boiling, grilling, and broiling are all great options.  Lose weight if you are overweight. Losing even a small amount of weight can help your overall health. It can also help prevent diseases such as diabetes and heart disease. What foods can I eat? Grains Whole grains, such as whole wheat or whole grain breads, crackers, cereals, and pasta. Unsweetened oatmeal, bulgur, barley, quinoa, or brown rice. Corn or whole wheat flour tortillas. Vegetables Fresh or frozen vegetables (raw, steamed, roasted, or grilled). Green salads. Fruits All fresh, canned (in  natural juice), or frozen fruits. Meat and Other Protein Products Ground beef (85% or leaner), grass-fed beef, or beef trimmed of fat. Skinless chicken or [place]. Ground chicken or [place]. Pork trimmed of fat. All fish and seafood. Eggs. Dried beans, peas, or lentils. Unsalted nuts or seeds. Unsalted canned or dry beans. Dairy Low-fat dairy products, such as skim or 1% milk, 2% or reduced-fat cheeses, low-fat ricotta or cottage cheese, or plain low-fat yogurt. Fats and Oils Tub margarines without trans fats. Light or reduced-fat mayonnaise and salad dressings. Avocado. Olive, canola, sesame, or safflower oils. Natural peanut or almond butter (choose ones without added sugar and oil). The items listed above may not be a complete list of recommended foods or beverages. Contact your dietitian for more options. What foods are not recommended? Grains White bread. White pasta. White rice. Cornbread. Bagels, pastries, and croissants. Crackers that contain trans fat. Vegetables White potatoes. Corn. Creamed or fried vegetables. Vegetables in a cheese sauce. Fruits Dried fruits. Canned fruit in light or heavy syrup. Fruit juice. Meat and Other Protein Products Fatty cuts of meat. Ribs, chicken wings, bacon, sausage, bologna, salami, chitterlings, fatback, hot dogs, bratwurst, and packaged luncheon meats. Liver and organ meats. Dairy Whole or 2% milk, cream, half-and-half, and cream cheese. Whole milk cheeses. Whole-fat or sweetened yogurt. Full-fat cheeses. Nondairy creamers and whipped toppings. Processed cheese, cheese spreads, or cheese curds. Sweets and Desserts Corn syrup, sugars, honey, and molasses. Candy. Jam and jelly. Syrup. Sweetened cereals. Cookies, pies, cakes, donuts, muffins, and ice cream. Fats and Oils Butter, stick margarine, lard, shortening, ghee, or bacon fat. Coconut, palm kernel, or palm oils. Beverages Alcohol. Sweetened drinks (such as sodas, lemonade, and fruit drinks or  punches). The items listed above may not be a complete list of foods and beverages to avoid. Contact your dietitian for more information. This information is not intended to replace advice given to you by your health care provider. Make sure you discuss any questions you have with your health care provider. Document Released: 01/29/2012 Document Revised: 04/05/2016 Document Reviewed: 10/29/2013 Elsevier Interactive Patient Education  2018 [place] Content in Foods Generally, most healthy people need around 50 grams of protein each day. Depending on your overall health, you may need more or less protein in your diet. Talk to your health care provider or dietitian about how much protein you need. See the following list for the protein content of some common foods. High-protein foods High-protein foods contain 4 grams (4 g) or more of protein per serving. They include: Beef, ground sirloin (cooked) -  3 oz have 24 g of protein. Cheese (hard) - 1 oz has 7 g of protein. Chicken breast, boneless and skinless (cooked) - 3 oz have 13.4 g of protein. Cottage cheese - 1/2 cup has 13.4 g of protein. Egg - 1 egg has 6 g of protein. Fish, filet (cooked) - 1 oz has 6-7 g of protein. Garbanzo beans (canned or cooked) - 1/2 cup has 6-7 g of protein. Kidney beans (canned or cooked) - 1/2 cup has 6-7 g of protein. Lamb (cooked) - 3 oz has 24 g of protein. Milk - 1 cup (8 oz) has 8 g of protein. Nuts (peanuts, pistachios, almonds) - 1 oz has 6 g of protein. Peanut butter - 1 oz has 7-8 g of protein. Pork tenderloin (cooked) - 3 oz has 18.4 g of protein. Pumpkin seeds - 1 oz has 8.5 g of protein. Soybeans (roasted) - 1 oz has 8 g of protein. Soybeans (cooked) - 1/2 cup has 11 g of protein. Soy milk - 1 cup (8 oz) has 5-10 g of protein. Soy or vegetable patty - 1 patty has 11 g of protein. Sunflower seeds - 1 oz has 5.5 g of protein. Tofu (firm) - 1/2 cup has 20 g of protein. Tuna (canned in  water) - 3 oz has 20 g of protein. Yogurt - 6 oz has 8 g of protein.  Low-protein foods Low-protein foods contain 3 grams (3 g) or less of protein per serving. They include: Beets (raw or cooked) - 1/2 cup has 1.5 g of protein. Bran cereal - 1/2 cup has 2-3 g of protein. Bread - 1 slice has 2.5 g of protein. Broccoli (raw or cooked) - 1/2 cup has 2 g of protein. Collard greens (raw or cooked) - 1/2 cup has 2 g of protein. Corn (fresh or cooked) - 1/2 cup has 2 g of protein. Cream cheese - 1 oz has 2 g of protein. Creamer (half-and-half) - 1 oz has 1 g of protein. Flour tortilla - 1 tortilla has 2.5 g of protein Frozen yogurt - 1/2 cup has 3 g of protein. Fruit or vegetable juice - 1/2 cup has 1 g of protein. Green beans (raw or cooked) - 1/2 cup has 1 g of protein. Green peas (canned) - 1/2 cup has 3.5 g of protein. Muffins - 1 small muffin (2 oz) has 3 g of protein. Oatmeal (cooked) - 1/2 cup has 3 g of protein. Potato (baked with skin) - 1 medium potato has 3 g of protein. Rice (cooked) - 1/2 cup has 2.5-3.5 g of protein. Sour cream - 1/2 cup has 2.5 g of protein. Spinach (cooked) - 1/2 cup has 3 g of protein. Squash (cooked) - 1/2 cup has 1.5 g of protein.  Actual amounts of protein may be different depending on processing. Talk with your health care provider or dietitian about what foods are recommended for you. This information is not intended to replace advice given to you by your health care provider. Make sure you discuss any questions you have with your health care provider. Document Released: 10/29/2015 Document Revised: 04/09/2016 Document Reviewed: 04/09/2016 Elsevier Interactive Patient Education  [employer].

## 2017-09-12 DIAGNOSIS — I129 Hypertensive chronic kidney disease with stage 1 through stage 4 chronic kidney disease, or unspecified chronic kidney disease: Secondary | ICD-10-CM | POA: Diagnosis not present

## 2017-09-12 DIAGNOSIS — N183 Chronic kidney disease, stage 3 (moderate): Secondary | ICD-10-CM | POA: Diagnosis not present

## 2017-09-12 DIAGNOSIS — E669 Obesity, unspecified: Secondary | ICD-10-CM | POA: Diagnosis not present

## 2017-09-12 DIAGNOSIS — Z6841 Body Mass Index (BMI) 40.0 and over, adult: Secondary | ICD-10-CM | POA: Diagnosis not present

## 2017-09-12 DIAGNOSIS — M109 Gout, unspecified: Secondary | ICD-10-CM | POA: Diagnosis not present

## 2017-10-08 ENCOUNTER — Telehealth: Payer: Self-pay | Admitting: Internal Medicine

## 2017-10-08 NOTE — Telephone Encounter (Signed)
Copied from [place] [phone]. Topic: Quick Communication - Rx Refill/Question >> Oct 08, 2017 11:22 AM Neva Seat wrote: Atorvastatin 40 mg  Pt states that pharmacy is saying that pt will need a doctor visit before his Rx can be filled.  He said he was in last month for a visit.  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  Pleae call pt to discuss if he needs to come in again or if he can get the Rx filled asap.   [facility] [place] - [address] [place] [id number] Phone: [phone] Fax: [phone]

## 2017-10-09 ENCOUNTER — Other Ambulatory Visit: Payer: Self-pay

## 2017-10-09 MED ORDER — ATORVASTATIN CALCIUM 40 MG PO TABS: 40.0000 mg | ORAL_TABLET | Freq: Every day | ORAL | 1 refills | Status: DC

## 2017-10-18 ENCOUNTER — Encounter: Payer: Self-pay | Admitting: Internal Medicine

## 2017-10-18 ENCOUNTER — Ambulatory Visit (INDEPENDENT_AMBULATORY_CARE_PROVIDER_SITE_OTHER): Payer: PPO | Admitting: Internal Medicine

## 2017-10-18 VITALS — BP 136/82 | HR 64 | Temp 97.5°F | Ht 70.0 in | Wt 296.0 lb

## 2017-10-18 DIAGNOSIS — M549 Dorsalgia, unspecified: Secondary | ICD-10-CM

## 2017-10-18 DIAGNOSIS — R7302 Impaired glucose tolerance (oral): Secondary | ICD-10-CM | POA: Diagnosis not present

## 2017-10-18 DIAGNOSIS — I10 Essential (primary) hypertension: Secondary | ICD-10-CM

## 2017-10-18 MED ORDER — TRAMADOL HCL 50 MG PO TABS: 50.0000 mg | ORAL_TABLET | Freq: Three times a day (TID) | ORAL | 1 refills | Status: DC | PRN

## 2017-10-18 MED ORDER — GABAPENTIN 100 MG PO CAPS: 100.0000 mg | ORAL_CAPSULE | Freq: Three times a day (TID) | ORAL | 5 refills | Status: DC

## 2017-10-18 NOTE — Progress Notes (Signed)
Subjective:    Patient ID: Troy Palmer, male    DOB: [birth year], 72 y.o.   MRN: [id number]  HPI  Here with acute on chronic intermittent years of lower back pain, now increased x 1 wk in severity, constant, radiates to the neck bilat and bilat hip areas, but denies bowel or bladder change, fever, wt loss,  worsening LE pain/numbness/weakness, gait change or falls.  Also has chronic right knee pain with a tendency towards giveaway but none so far and no falls.  Has known LS spinal stenosis.  Pt denies chest pain, increased sob or doe, wheezing, orthopnea, PND, increased LE swelling, palpitations, dizziness or syncope.  Pt denies new neurological symptoms such as new headache, or facial or extremity weakness or numbness   Pt denies polydipsia, polyuria Past Medical History:  Diagnosis Date  . ALLERGIC RHINITIS 04/09/2007  . ASTHMA 12/04/2007  . BACK PAIN 09/16/2009  . BENIGN PROSTATIC HYPERTROPHY 04/09/2007  . CEREBROVASCULAR ACCIDENT, HX OF 07/17/2010  . COLONIC POLYPS, HX OF 12/04/2007  . CONSTIPATION 09/25/2010  . Degeneration of cervical intervertebral disc 03/28/2007  . DEPRESSION 12/04/2007  . DIVERTICULOSIS, COLON 12/04/2007  . DYSPHAGIA UNSPECIFIED 03/16/2008  . ERECTILE DYSFUNCTION 04/09/2007  . ESOPHAGEAL STRICTURE 05/26/2008  . GERD 12/04/2007  . HEMORRHOIDS, RECURRENT 02/11/2008  . HYPERLIPIDEMIA 12/04/2007  . HYPERTENSION 03/28/2007  . LEG PAIN, BILATERAL 01/25/2010  . LOW BACK PAIN 04/09/2007  . LUNG NODULE 12/04/2007  . Osteoarth NOS-Unspec 03/28/2007  . Other dysphagia 11/21/2009  . POLYARTHRALGIA 07/13/2008  . Polymyalgia rheumatica ([place]) 07/26/2008  . PVD WITH CLAUDICATION 02/10/2010  . RENAL INSUFFICIENCY 03/15/2010  . SLEEP APNEA, OBSTRUCTIVE, MODERATE 04/09/2007  . TB SKIN TEST, POSITIVE 03/28/2007   Past Surgical History:  Procedure Laterality Date  . ROTATOR CUFF REPAIR     Left  . TOTAL KNEE ARTHROPLASTY     x 2    reports that he has quit smoking. he has never used  smokeless tobacco. He reports that he does not drink alcohol or use drugs. family history includes Asthma in his other; Heart disease in his father; Hypertension in his brother; Lupus in his brother. Allergies  Allergen Reactions  . Ace Inhibitors Swelling    Angioedema throat  . Doxycycline     Some GI upset  . Sulfa Antibiotics Hives    And facial angioedema   Current Outpatient Medications on File Prior to Visit  Medication Sig Dispense Refill  . allopurinol (ZYLOPRIM) 100 MG tablet take 1 tablet by mouth once daily 90 tablet 3  . amLODipine (NORVASC) 5 MG tablet take 1 tablet by mouth once daily 30 tablet 5  . atorvastatin (LIPITOR) 40 MG tablet Take 1 tablet (40 mg total) by mouth daily. 90 tablet 1  . BYSTOLIC 10 MG tablet take 2 tablets by mouth once daily 60 tablet 5  . colchicine 0.6 MG tablet Take 1 tablet (0.6 mg total) by mouth 2 (two) times daily. 60 tablet 5  . furosemide (LASIX) 40 MG tablet take 1 tablet by mouth every morning AND 1 IN THE EVENING AS NEEDED FOR SWELLING ONLY 60 tablet 11  . naphazoline-pheniramine (NAPHCON-A) 0.025-0.3 % ophthalmic solution Place 1 drop into both eyes 4 (four) times daily as needed for irritation.    . pantoprazole (PROTONIX) 40 MG tablet Take 1 tablet (40 mg total) by mouth daily. 90 tablet 3  . tamsulosin (FLOMAX) 0.4 MG CAPS capsule 1 tab by mouth twice per day 180 capsule 3   No  current facility-administered medications on file prior to visit.    Review of Systems  Constitutional: Negative for other unusual diaphoresis or sweats HENT: Negative for ear discharge or swelling Eyes: Negative for other worsening visual disturbances Respiratory: Negative for stridor or other swelling  Gastrointestinal: Negative for worsening distension or other blood Genitourinary: Negative for retention or other urinary change Musculoskeletal: Negative for other MSK pain or swelling Skin: Negative for color change or other new lesions Neurological:  Negative for worsening tremors and other numbness  Psychiatric/Behavioral: Negative for worsening agitation or other fatigue All other system neg per pt    Objective:   Physical Exam BP 136/82   Pulse 64   Temp (!) 97.5 F (36.4 C) (Oral)   Ht 5\' 10"  (1.778 m)   Wt 296 lb (134.3 kg)   SpO2 95%   BMI 42.47 kg/m  VS noted,  Constitutional: Pt appears in NAD HENT: Head: NCAT.  Right Ear: External ear normal.  Left Ear: External ear normal.  Eyes: . Pupils are equal, round, and reactive to light. Conjunctivae and EOM are normal Nose: without d/c or deformity Neck: Neck supple. Gross normal ROM Cardiovascular: Normal rate and regular rhythm.   Pulmonary/Chest: Effort normal and breath sounds without rales or wheezing.  Abd:  Soft, NT, ND, + BS, no organomegaly Spine with diffuse low midline tender  Neurological: Pt is alert. At baseline orientation, motor 5/5 intact Skin: Skin is warm. No rashes, other new lesions, no LE edema Psychiatric: Pt behavior is normal without agitation  No other exam findings  4/15/2016MRI LUMBAR SPINE WITHOUT CONTRAST - summary IMPRESSION: 1. Multifactorial mild lumbar spinal stenosis from L2-L3 to L3-L4, in part related to epidural lipomatosis. 2. Moderate multifactorial spinal stenosis at L4-L5 with mild left lateral recess stenosis and mild to moderate left foraminal stenosis. 3. Moderate to severe facet degeneration at L5-S1 significantly contributes to moderate to severe left and mild right L5 foraminal stenosis.    Assessment & Plan:

## 2017-10-18 NOTE — Patient Instructions (Addendum)
Please take all new medication as prescribed - the tramadol as needed for pain, and gabapentin 100 mg three times per day  Please call in 1-2 wks if the gabapentin helps but not enough, as the dose can be increased  Please continue all other medications as before, and refills have been done if requested.  Please have the pharmacy call with any other refills you may need.  Please keep your appointments with your specialists as you may have planned

## 2017-10-20 NOTE — Assessment & Plan Note (Signed)
stable overall by history and exam, recent data reviewed with pt, and pt to continue medical treatment as before,  to f/u any worsening symptoms or concerns BP Readings from Last 3 Encounters:  10/18/17 136/82  08/22/17 (!) 142/64  08/16/17 (!) 142/86

## 2017-10-20 NOTE — Assessment & Plan Note (Signed)
Acute on chronic, no neuro change, for tramadol prn, gabapentin, declines predpac,  to f/u any worsening symptoms or concerns

## 2017-10-20 NOTE — Assessment & Plan Note (Signed)
Lab Results  Component Value Date   HGBA1C 5.7 08/16/2017  stable overall by history and exam, recent data reviewed with pt, and pt to continue medical treatment as before,  to f/u any worsening symptoms or concerns

## 2017-11-05 ENCOUNTER — Telehealth: Payer: Self-pay

## 2017-11-05 MED ORDER — COLCHICINE 0.6 MG PO TABS: 0.6000 mg | ORAL_TABLET | Freq: Two times a day (BID) | ORAL | 5 refills | Status: DC

## 2017-11-10 ENCOUNTER — Encounter (HOSPITAL_COMMUNITY): Payer: Self-pay

## 2017-11-10 ENCOUNTER — Emergency Department (HOSPITAL_COMMUNITY): Payer: PPO

## 2017-11-10 ENCOUNTER — Emergency Department (HOSPITAL_COMMUNITY)
Admission: EM | Admit: 2017-11-10 | Discharge: 2017-11-10 | Disposition: A | Discharge status: Home / Self Care | Payer: PPO | Attending: Emergency Medicine | Admitting: Emergency Medicine

## 2017-11-10 DIAGNOSIS — I129 Hypertensive chronic kidney disease with stage 1 through stage 4 chronic kidney disease, or unspecified chronic kidney disease: Secondary | ICD-10-CM | POA: Diagnosis not present

## 2017-11-10 DIAGNOSIS — W109XXA Fall (on) (from) unspecified stairs and steps, initial encounter: Secondary | ICD-10-CM | POA: Insufficient documentation

## 2017-11-10 DIAGNOSIS — J45909 Unspecified asthma, uncomplicated: Secondary | ICD-10-CM | POA: Insufficient documentation

## 2017-11-10 DIAGNOSIS — Y999 Unspecified external cause status: Secondary | ICD-10-CM | POA: Diagnosis not present

## 2017-11-10 DIAGNOSIS — S61212A Laceration without foreign body of right middle finger without damage to nail, initial encounter: Secondary | ICD-10-CM

## 2017-11-10 DIAGNOSIS — N189 Chronic kidney disease, unspecified: Secondary | ICD-10-CM | POA: Insufficient documentation

## 2017-11-10 DIAGNOSIS — Z96653 Presence of artificial knee joint, bilateral: Secondary | ICD-10-CM | POA: Insufficient documentation

## 2017-11-10 DIAGNOSIS — Z79899 Other long term (current) drug therapy: Secondary | ICD-10-CM | POA: Diagnosis not present

## 2017-11-10 DIAGNOSIS — M25512 Pain in left shoulder: Secondary | ICD-10-CM | POA: Insufficient documentation

## 2017-11-10 DIAGNOSIS — Y9389 Activity, other specified: Secondary | ICD-10-CM | POA: Diagnosis not present

## 2017-11-10 DIAGNOSIS — Y929 Unspecified place or not applicable: Secondary | ICD-10-CM | POA: Diagnosis not present

## 2017-11-10 DIAGNOSIS — W19XXXA Unspecified fall, initial encounter: Secondary | ICD-10-CM

## 2017-11-10 DIAGNOSIS — S4992XA Unspecified injury of left shoulder and upper arm, initial encounter: Secondary | ICD-10-CM | POA: Diagnosis not present

## 2017-11-10 DIAGNOSIS — Z87891 Personal history of nicotine dependence: Secondary | ICD-10-CM | POA: Insufficient documentation

## 2017-11-10 MED ORDER — MELOXICAM 7.5 MG PO TABS: 7.5000 mg | ORAL_TABLET | Freq: Every day | ORAL | 0 refills | Status: DC

## 2017-11-10 NOTE — ED Provider Notes (Signed)
[facility] EMERGENCY DEPARTMENT Provider Note   CSN: [id number] Arrival date & time: 11/10/17  2993     History   Chief Complaint No chief complaint on file.   HPI Troy Palmer is a 72 y.o. male presenting for evaluation of left shoulder pain.  Patient states he missed the last step going down the stairs and fell into a door.  He denies hitting his head or loss of consciousness.  He denies new head pain, neck pain, or back pain.  He reports pain of his left shoulder and a superficial cut to his right finger.  He denies dizziness, vision changes, slurred speech, decreased concentration, nausea, vomiting.  He denies chest pain, shortness of breath, abdominal pain, loss of bowel or bladder control, numbness, or tingling.  He has not taken anything for pain including Tylenol or ibuprofen.  Movement makes the pain worse, nothing makes it better.  It does not radiate.  He has a history of rotator cuff repair in the shoulder performed by Dr. Ronnie Derby last year.  He is not on blood thinners.  He has ambulated since the fall without difficulty. History of CKD, creatine stable.   HPI  Past Medical History:  Diagnosis Date  . ALLERGIC RHINITIS 04/09/2007  . ASTHMA 12/04/2007  . BACK PAIN 09/16/2009  . BENIGN PROSTATIC HYPERTROPHY 04/09/2007  . CEREBROVASCULAR ACCIDENT, HX OF 07/17/2010  . COLONIC POLYPS, HX OF 12/04/2007  . CONSTIPATION 09/25/2010  . Degeneration of cervical intervertebral disc 03/28/2007  . DEPRESSION 12/04/2007  . DIVERTICULOSIS, COLON 12/04/2007  . DYSPHAGIA UNSPECIFIED 03/16/2008  . ERECTILE DYSFUNCTION 04/09/2007  . ESOPHAGEAL STRICTURE 05/26/2008  . GERD 12/04/2007  . HEMORRHOIDS, RECURRENT 02/11/2008  . HYPERLIPIDEMIA 12/04/2007  . HYPERTENSION 03/28/2007  . LEG PAIN, BILATERAL 01/25/2010  . LOW BACK PAIN 04/09/2007  . LUNG NODULE 12/04/2007  . Osteoarth NOS-Unspec 03/28/2007  . Other dysphagia 11/21/2009  . POLYARTHRALGIA 07/13/2008  . Polymyalgia rheumatica  ([place]) 07/26/2008  . PVD WITH CLAUDICATION 02/10/2010  . RENAL INSUFFICIENCY 03/15/2010  . SLEEP APNEA, OBSTRUCTIVE, MODERATE 04/09/2007  . TB SKIN TEST, POSITIVE 03/28/2007    Patient Active Problem List   Diagnosis Date Noted  . BPH with obstruction/lower urinary tract symptoms 05/16/2017  . Bilateral ankle pain 05/16/2017  . Acute gout 02/26/2017  . Dyspnea 11/16/2016  . Complete tear of right rotator cuff 06/26/2016  . Cellulitis of deltoid region 06/26/2016  . Greater trochanteric bursitis of right hip 05/05/2014  . Right hip pain 05/04/2014  . Posterior neck pain 12/22/2013  . Orchitis, right 12/22/2013  . Paresthesia of both feet 12/22/2013  . Bilateral shoulder pain 09/16/2013  . Allergic angioedema 07/31/2012  . Impaired glucose tolerance 04/15/2012  . Chest pain 02/24/2012  . Foot pain, bilateral 02/20/2012  . Leg pain, bilateral 01/19/2012  . Edema 01/16/2012  . Dysphagia 11/21/2011  . Gout 06/07/2011  . CKD (chronic kidney disease) 06/07/2011  . Wellness examination 02/14/2011  . Encounter for long-term (current) use of other medications 11/08/2010  . CONSTIPATION 09/25/2010  . Memory loss 06/28/2010  . PVD (peripheral vascular disease) ([place]) 02/10/2010  . LEG PAIN, BILATERAL 01/25/2010  . Backache 09/16/2009  . Polymyalgia rheumatica ([place]) 07/26/2008  . POLYARTHRALGIA 07/13/2008  . ESOPHAGEAL STRICTURE 05/26/2008  . HEMORRHOIDS, RECURRENT 02/11/2008  . Hyperlipidemia 12/04/2007  . DEPRESSION 12/04/2007  . ASTHMA 12/04/2007  . LUNG NODULE 12/04/2007  . GERD 12/04/2007  . DIVERTICULOSIS, COLON 12/04/2007  . COLONIC POLYPS, HX OF 12/04/2007  . ERECTILE DYSFUNCTION  04/09/2007  . SLEEP APNEA, OBSTRUCTIVE, MODERATE 04/09/2007  . Allergic rhinitis 04/09/2007  . BENIGN PROSTATIC HYPERTROPHY 04/09/2007  . LOW BACK PAIN 04/09/2007  . OBESITY 03/28/2007  . Essential hypertension 03/28/2007  . Osteoarth NOS-Unspec 03/28/2007  . Degeneration of cervical intervertebral  disc 03/28/2007  . TB SKIN TEST, POSITIVE 03/28/2007    Past Surgical History:  Procedure Laterality Date  . ROTATOR CUFF REPAIR     Left  . TOTAL KNEE ARTHROPLASTY     x 2        Home Medications    Prior to Admission medications   Medication Sig Start Date End Date Taking? Authorizing Provider  allopurinol (ZYLOPRIM) 100 MG tablet take 1 tablet by mouth once daily 12/18/16   Biagio Borg, MD  amLODipine [facility]) 5 MG tablet take 1 tablet by mouth once daily 05/06/17   Biagio Borg, MD  atorvastatin (LIPITOR) 40 MG tablet Take 1 tablet (40 mg total) by mouth daily. 10/09/17   Biagio Borg, MD  BYSTOLIC 10 MG tablet take 2 tablets by mouth once daily 04/23/17   Biagio Borg, MD  colchicine 0.6 MG tablet Take 1 tablet (0.6 mg total) by mouth 2 (two) times daily. 11/05/17   Biagio Borg, MD  furosemide (LASIX) 40 MG tablet take 1 tablet by mouth every morning AND 1 IN THE EVENING AS NEEDED FOR SWELLING ONLY 06/01/17   Biagio Borg, MD  gabapentin (NEURONTIN) 100 MG capsule Take 1 capsule (100 mg total) by mouth 3 (three) times daily. 10/18/17   Biagio Borg, MD  meloxicam (MOBIC) 7.5 MG tablet Take 1 tablet (7.5 mg total) by mouth daily. 11/10/17   Kathy Wares, PA-C  naphazoline-pheniramine (NAPHCON-A) 0.025-0.3 % ophthalmic solution Place 1 drop into both eyes 4 (four) times daily as needed for irritation.    [provider]  pantoprazole (PROTONIX) 40 MG tablet Take 1 tablet (40 mg total) by mouth daily. 08/16/17 08/16/18  Biagio Borg, MD  tamsulosin [facility]) 0.4 MG CAPS capsule 1 tab by mouth twice per day 05/16/17   Biagio Borg, MD  traMADol (ULTRAM) 50 MG tablet Take 1 tablet (50 mg total) by mouth every 8 (eight) hours as needed. 10/18/17   Biagio Borg, MD    Family History Family History  Problem Relation Age of Onset  . Heart disease Father   . Lupus Brother   . Hypertension Brother   . Asthma Other     Social History Social History   Tobacco Use  .  Smoking status: Former Research scientist (life sciences)  . Smokeless tobacco: Never Used  Substance Use Topics  . Alcohol use: No  . Drug use: No     Allergies   Ace inhibitors; Doxycycline; and Sulfa antibiotics   Review of Systems Review of Systems  HENT: Negative for facial swelling.   Eyes: Negative for visual disturbance.  Respiratory: Negative for shortness of breath.   Cardiovascular: Negative for chest pain.  Gastrointestinal: Negative for abdominal pain, nausea and vomiting.  Genitourinary:       No loss of bowel or bladder control  Musculoskeletal: Positive for arthralgias. Negative for joint swelling and myalgias.  Skin: Positive for wound.  Neurological: Negative for dizziness, numbness and headaches.  Hematological: Does not bruise/bleed easily.  Psychiatric/Behavioral: Negative for confusion.     Physical Exam Updated Vital Signs BP (!) 142/67 (BP Location: Right Arm)   Pulse 74   Temp 98.4 F (36.9 C) (Oral)  Resp 18   SpO2 99%   Physical Exam  Constitutional: He is oriented to person, place, and time. He appears well-developed and well-nourished. No distress.  HENT:  Head: Normocephalic and atraumatic.  No obvious injury to the head.  No tenderness to palpation.  Eyes: Pupils are equal, round, and reactive to light. Conjunctivae and EOM are normal.  EOMI and PERRLA.  Neck: Normal range of motion. Neck supple.  No tenderness to palpation of C-spine.  No step-offs.  Cardiovascular: Normal rate, regular rhythm and intact distal pulses.  Pulmonary/Chest: Effort normal and breath sounds normal. No respiratory distress. He has no wheezes.  No tenderness to palpation of the chest wall.  Clear lung sounds in all fields.  Abdominal: Soft. He exhibits no distension and no mass. There is no tenderness. There is no guarding.  Musculoskeletal: He exhibits tenderness. He exhibits no deformity.  Tenderness to palpation of the anterior and posterior shoulder and surrounding musculature.   No obvious deformity.  No laceration, contusion, hematoma.  No tenderness to palpation of left-sided shoulder, back, or midline spine.  Strength of upper extremity is intact bilaterally.  Sensation intact bilaterally.  Radial pulses equal bilaterally.  Soft compartments.  Pain with active range of motion of the L shoulder, decreased pain with passive range of motion.  Neurological: He is alert and oriented to person, place, and time. He has normal strength. No cranial nerve deficit or sensory deficit. GCS eye subscore is 4. GCS verbal subscore is 5. GCS motor subscore is 6.  Skin: Skin is warm and dry.  Superficial laceration just distal to the PIP of the right middle finger on the dorsum without active bleeding.  Psychiatric: He has a normal mood and affect.  Nursing note and vitals reviewed.    ED Treatments / Results  Labs (all labs ordered are listed, but only abnormal results are displayed) Labs Reviewed - No data to display  EKG None  Radiology Dg Shoulder Left  Result Date: 11/10/2017 CLINICAL DATA:  Patient reports fall, reports he is having left shoulder joint pain. Limited movement of left shoulder pain during abduction. axillary view not obtained due to patients condition. EXAM: LEFT SHOULDER - 2+ VIEW COMPARISON:  None. FINDINGS: Generalized osteopenia. No acute fracture or dislocation. No significant arthropathy of the glenohumeral joint. Mild arthropathy of the acromioclavicular joint. IMPRESSION: No acute osseous injury of the left shoulder. Electronically Signed   By: Kathreen Devoid   On: 11/10/2017 09:56    Procedures Procedures (including critical care time)  Medications Ordered in ED Medications - No data to display   Initial Impression / Assessment and Plan / ED Course  I have reviewed the triage vital signs and the nursing notes.  Pertinent labs & imaging results that were available during my care of the patient were reviewed by me and considered in my medical  decision making (see chart for details).     Patient presenting for evaluation of left shoulder pain.  Physical exam reassuring, he is neurovascularly intact.  X-ray reviewed and interpreted by me, no sign of fracture dislocation.  Concern for rotator cuff tear or other ligamentous injury.  Discussed findings with patient.  Discussed treatment with NSAIDs, rest, ice, and sling.  Follow-up with orthopedics for further evaluation.  No other sign of injury or trauma including head injury.  At this time, patient appears safe for discharge.  Return precautions given.  Patient states he understands and agrees to plan.  Final Clinical Impressions(s) / ED  Diagnoses   Final diagnoses:  Acute pain of left shoulder  Laceration of right middle finger without foreign body without damage to nail, initial encounter  Fall, initial encounter    ED Discharge Orders        Ordered    meloxicam (MOBIC) 7.5 MG tablet  Daily     11/10/17 [address] PA-C 11/10/17 1250    Gareth Morgan, MD 11/11/17 [phone]

## 2017-11-10 NOTE — ED Triage Notes (Signed)
Patient complains of left shoulder pain following fall this am. Reports that he missed step and fell, no loc. Pain with any ROM

## 2017-11-10 NOTE — ED Notes (Signed)
Ortho paged. 

## 2017-11-10 NOTE — Discharge Instructions (Signed)
Take Mobic once a day.  Make sure you stay well-hydrated while taking his medication.  Do not take other anti-inflammatories at the same time open (Advil, Motrin, ibuprofen, Aleve). Take tramadol as needed for severe pain.  Have caution, as this may increase your risk of falling. Use ice packs on your shoulder, 20 minutes at a time as frequently as possible. Use the sling for comfort. Follow-up with Dr. Lorre Nick for further evaluation of your shoulder. Return to the emergency room if you develop numbness, inability to move your hand, or any new or concerning symptoms.

## 2017-11-10 NOTE — Progress Notes (Signed)
Orthopedic Tech Progress Note Patient Details:  Troy Palmer [birth year] [id number]  Ortho Devices Type of Ortho Device: Arm sling Ortho Device/Splint Location: lue Ortho Device/Splint Interventions: Application   Post Interventions Patient Tolerated: Well Instructions Provided: Care of device   [name] 11/10/2017, 11:44 AM

## 2017-11-12 ENCOUNTER — Other Ambulatory Visit: Payer: Self-pay

## 2017-11-12 NOTE — Patient Outreach (Signed)
[facility]) Care Management  11/12/2017  Troy Palmer [birth year] [id number]   Telephone Screen  Referral Date: 11/12/17 Referral Source: Urgent [facility] Dept.  Referral Reason: "takes medications for shoulder swelling, patient fell doun the stairs and landed on shoulder that he had surgery on last year, he may need assistance with getting around the home" Insurance:HTA    Outreach attempt # 1 to patient. Spoke with patient. RN CM briefly discussed referral and nature of call. Patient sounded annoyed and voiced "I just spoke with someone about this yesterday" but was unable to report whom he spoke with. He states that he is having shoulder pain related to fall and taking meds. He also  reports he has ongoing back pain. He voices he is going to MD appt on Friday and will talk with MD about a back brace. He reported that someone stays with him "most of the times" and he does not feel like he needs any assistance. Patient declined to continue with further screening.      Plan: RN CM will close case at this time.    Enzo Montgomery, RN,BSN,CCM [place] [facility] Telephonic Care Management Coordinator Direct Phone: [phone] Toll Free: [phone] Fax: [phone]

## 2017-11-19 ENCOUNTER — Other Ambulatory Visit: Payer: Self-pay

## 2017-11-19 MED ORDER — AMLODIPINE BESYLATE 5 MG PO TABS: 5.0000 mg | ORAL_TABLET | Freq: Every day | ORAL | 5 refills | Status: DC

## 2017-11-20 ENCOUNTER — Encounter: Payer: PPO | Admitting: Internal Medicine

## 2017-11-20 ENCOUNTER — Other Ambulatory Visit: Payer: Self-pay

## 2017-11-20 MED ORDER — NEBIVOLOL HCL 10 MG PO TABS: 20.0000 mg | ORAL_TABLET | Freq: Every day | ORAL | 5 refills | Status: DC

## 2017-11-26 DIAGNOSIS — Z9181 History of falling: Secondary | ICD-10-CM | POA: Diagnosis not present

## 2017-11-26 DIAGNOSIS — M25512 Pain in left shoulder: Secondary | ICD-10-CM | POA: Diagnosis not present

## 2017-11-28 ENCOUNTER — Other Ambulatory Visit: Payer: Self-pay | Admitting: Orthopedic Surgery

## 2017-11-28 DIAGNOSIS — R52 Pain, unspecified: Secondary | ICD-10-CM

## 2017-12-05 ENCOUNTER — Telehealth: Payer: Self-pay | Admitting: Internal Medicine

## 2017-12-05 NOTE — Telephone Encounter (Signed)
Copied from [place] [id number]. Topic: Quick Communication - Rx Refill/Question >> Dec 05, 2017  3:11 PM Carolyn Stare wrote: Medication allopurinol (ZYLOPRIM) 100 MG tablet  Has the patient contacted their pharmacy yes     Preferred Pharmacy  [facility] [place]: Please be advised that RX refills may take up to 3 business days. We ask that you follow-up with your pharmacy.

## 2017-12-06 ENCOUNTER — Ambulatory Visit
Admission: RE | Admit: 2017-12-06 | Discharge: 2017-12-06 | Disposition: A | Discharge status: Home / Self Care | Payer: PPO | Source: Ambulatory Visit | Attending: Orthopedic Surgery | Admitting: Orthopedic Surgery

## 2017-12-06 DIAGNOSIS — R52 Pain, unspecified: Secondary | ICD-10-CM

## 2017-12-06 MED ORDER — ALLOPURINOL 100 MG PO TABS: 100.0000 mg | ORAL_TABLET | Freq: Every day | ORAL | 0 refills | Status: DC

## 2017-12-08 ENCOUNTER — Ambulatory Visit
Admission: RE | Admit: 2017-12-08 | Discharge: 2017-12-08 | Disposition: A | Discharge status: Home / Self Care | Payer: PPO | Source: Ambulatory Visit | Attending: Orthopedic Surgery | Admitting: Orthopedic Surgery

## 2017-12-12 DIAGNOSIS — Z9889 Other specified postprocedural states: Secondary | ICD-10-CM | POA: Diagnosis not present

## 2017-12-12 DIAGNOSIS — M25412 Effusion, left shoulder: Secondary | ICD-10-CM | POA: Diagnosis not present

## 2017-12-12 DIAGNOSIS — R600 Localized edema: Secondary | ICD-10-CM | POA: Diagnosis not present

## 2017-12-12 DIAGNOSIS — S46012A Strain of muscle(s) and tendon(s) of the rotator cuff of left shoulder, initial encounter: Secondary | ICD-10-CM | POA: Diagnosis not present

## 2017-12-12 DIAGNOSIS — M62512 Muscle wasting and atrophy, not elsewhere classified, left shoulder: Secondary | ICD-10-CM | POA: Diagnosis not present

## 2017-12-12 DIAGNOSIS — M65812 Other synovitis and tenosynovitis, left shoulder: Secondary | ICD-10-CM | POA: Diagnosis not present

## 2017-12-31 ENCOUNTER — Encounter: Payer: Self-pay | Admitting: Internal Medicine

## 2017-12-31 ENCOUNTER — Ambulatory Visit (INDEPENDENT_AMBULATORY_CARE_PROVIDER_SITE_OTHER): Payer: PPO | Admitting: Internal Medicine

## 2017-12-31 VITALS — BP 120/70 | HR 67 | Temp 97.9°F | Ht 70.0 in | Wt 298.0 lb

## 2017-12-31 DIAGNOSIS — M7989 Other specified soft tissue disorders: Secondary | ICD-10-CM

## 2017-12-31 DIAGNOSIS — R7302 Impaired glucose tolerance (oral): Secondary | ICD-10-CM

## 2017-12-31 DIAGNOSIS — M79661 Pain in right lower leg: Secondary | ICD-10-CM

## 2017-12-31 DIAGNOSIS — I10 Essential (primary) hypertension: Secondary | ICD-10-CM | POA: Diagnosis not present

## 2017-12-31 NOTE — Assessment & Plan Note (Signed)
Mild, suspect RLE venous insufficiency but cant r/o DVT - for RLE venous doppler, compression stocking, leg elevation, low salt diet, wt loss

## 2017-12-31 NOTE — Progress Notes (Signed)
Subjective:    Patient ID: Troy Palmer, male    DOB: [birth year], 72 y.o.   MRN: [id number]  HPI  Here with c/o RLE pain swelling that seems to be worse later in the day, better in the AM when first gets up, mild ongoing for 2-3 months, has some very mild discomfort like a light pressure sometimes, but no redness, ulcer, drainage, knee pain, or hx of dvt.  He was thinking possibly gout but no specific joint pain and swelling.  Pt denies chest pain, increased sob or doe, wheezing, orthopnea, PND, palpitations, dizziness or syncope.   Pt denies polydipsia, polyuria  No other interval hx or new complaint Past Medical History:  Diagnosis Date  . ALLERGIC RHINITIS 04/09/2007  . ASTHMA 12/04/2007  . BACK PAIN 09/16/2009  . BENIGN PROSTATIC HYPERTROPHY 04/09/2007  . CEREBROVASCULAR ACCIDENT, HX OF 07/17/2010  . COLONIC POLYPS, HX OF 12/04/2007  . CONSTIPATION 09/25/2010  . Degeneration of cervical intervertebral disc 03/28/2007  . DEPRESSION 12/04/2007  . DIVERTICULOSIS, COLON 12/04/2007  . DYSPHAGIA UNSPECIFIED 03/16/2008  . ERECTILE DYSFUNCTION 04/09/2007  . ESOPHAGEAL STRICTURE 05/26/2008  . GERD 12/04/2007  . HEMORRHOIDS, RECURRENT 02/11/2008  . HYPERLIPIDEMIA 12/04/2007  . HYPERTENSION 03/28/2007  . LEG PAIN, BILATERAL 01/25/2010  . LOW BACK PAIN 04/09/2007  . LUNG NODULE 12/04/2007  . Osteoarth NOS-Unspec 03/28/2007  . Other dysphagia 11/21/2009  . POLYARTHRALGIA 07/13/2008  . Polymyalgia rheumatica ([place]) 07/26/2008  . PVD WITH CLAUDICATION 02/10/2010  . RENAL INSUFFICIENCY 03/15/2010  . SLEEP APNEA, OBSTRUCTIVE, MODERATE 04/09/2007  . TB SKIN TEST, POSITIVE 03/28/2007   Past Surgical History:  Procedure Laterality Date  . ROTATOR CUFF REPAIR     Left  . TOTAL KNEE ARTHROPLASTY     x 2    reports that he has quit smoking. He has never used smokeless tobacco. He reports that he does not drink alcohol or use drugs. family history includes Asthma in his other; Heart disease in his father;  Hypertension in his brother; Lupus in his brother. Allergies  Allergen Reactions  . Ace Inhibitors Swelling    Angioedema throat  . Doxycycline     Some GI upset  . Sulfa Antibiotics Hives    And facial angioedema   Current Outpatient Medications on File Prior to Visit  Medication Sig Dispense Refill  . allopurinol (ZYLOPRIM) 100 MG tablet Take 1 tablet (100 mg total) by mouth daily. 90 tablet 0  . amLODipine (NORVASC) 5 MG tablet Take 1 tablet (5 mg total) by mouth daily. 30 tablet 5  . atorvastatin (LIPITOR) 40 MG tablet Take 1 tablet (40 mg total) by mouth daily. 90 tablet 1  . colchicine 0.6 MG tablet Take 1 tablet (0.6 mg total) by mouth 2 (two) times daily. 60 tablet 5  . furosemide (LASIX) 40 MG tablet take 1 tablet by mouth every morning AND 1 IN THE EVENING AS NEEDED FOR SWELLING ONLY 60 tablet 11  . gabapentin (NEURONTIN) 100 MG capsule Take 1 capsule (100 mg total) by mouth 3 (three) times daily. 90 capsule 5  . meloxicam (MOBIC) 7.5 MG tablet Take 1 tablet (7.5 mg total) by mouth daily. 14 tablet 0  . naphazoline-pheniramine (NAPHCON-A) 0.025-0.3 % ophthalmic solution Place 1 drop into both eyes 4 (four) times daily as needed for irritation.    . nebivolol (BYSTOLIC) 10 MG tablet Take 2 tablets (20 mg total) by mouth daily. 60 tablet 5  . pantoprazole (PROTONIX) 40 MG tablet Take 1 tablet (40 mg  total) by mouth daily. 90 tablet 3  . tamsulosin (FLOMAX) 0.4 MG CAPS capsule 1 tab by mouth twice per day 180 capsule 3  . traMADol (ULTRAM) 50 MG tablet Take 1 tablet (50 mg total) by mouth every 8 (eight) hours as needed. 60 tablet 1   No current facility-administered medications on file prior to visit.    Review of Systems  Constitutional: Negative for other unusual diaphoresis or sweats HENT: Negative for ear discharge or swelling Eyes: Negative for other worsening visual disturbances Respiratory: Negative for stridor or other swelling  Gastrointestinal: Negative for worsening  distension or other blood Genitourinary: Negative for retention or other urinary change Musculoskeletal: Negative for other MSK pain or swelling Skin: Negative for color change or other new lesions Neurological: Negative for worsening tremors and other numbness  Psychiatric/Behavioral: Negative for worsening agitation or other fatigue All other system neg per pt    Objective:   Physical Exam BP 120/70   Pulse 67   Temp 97.9 F (36.6 C) (Oral)   Ht 5\' 10"  (1.778 m)   Wt 298 lb (135.2 kg)   SpO2 95%   BMI 42.76 kg/m  VS noted,  Constitutional: Pt appears in NAD HENT: Head: NCAT.  Right Ear: External ear normal.  Left Ear: External ear normal.  Eyes: . Pupils are equal, round, and reactive to light. Conjunctivae and EOM are normal Nose: without d/c or deformity Neck: Neck supple. Gross normal ROM Cardiovascular: Normal rate and regular rhythm.   Pulmonary/Chest: Effort normal and breath sounds without rales or wheezing.  Right leg with 1+ nontender diffuse swelling to right leg and foot below the knee with small pitting; no LLE swelling or pain Neurological: Pt is alert. At baseline orientation, motor grossly intact Skin: Skin is warm. No rashes, other new lesions, no LE edema Psychiatric: Pt behavior is normal without agitation  No other exam findings    Assessment & Plan:

## 2017-12-31 NOTE — Assessment & Plan Note (Signed)
stable overall by history and exam, recent data reviewed with pt, and pt to continue medical treatment as before,  to f/u any worsening symptoms or concerns BP Readings from Last 3 Encounters:  12/31/17 120/70  11/10/17 (!) 142/67  10/18/17 136/82

## 2017-12-31 NOTE — Patient Instructions (Signed)
You will be contacted regarding the referral for: leg vein circulation test  Please keep leg elevated while sitting as you can, follow low salt diet, and wt loss would be best to avoid more swelling  Please consider wearing a Compression Stocking (20-30 mmhg) to keep the swelling down as well  Please continue all other medications as before, including the diuretic  Please have the pharmacy call with any other refills you may need.  Please continue your efforts at being more active, low cholesterol diet, and weight control.  Please keep your appointments with your specialists as you may have planned

## 2017-12-31 NOTE — Assessment & Plan Note (Signed)
stable overall by history and exam, recent data reviewed with pt, and pt to continue medical treatment as before,  to f/u any worsening symptoms or concerns Lab Results  Component Value Date   HGBA1C 5.7 08/16/2017

## 2018-01-03 ENCOUNTER — Telehealth: Payer: Self-pay

## 2018-01-03 ENCOUNTER — Ambulatory Visit (HOSPITAL_COMMUNITY)
Admission: RE | Admit: 2018-01-03 | Discharge: 2018-01-03 | Disposition: A | Discharge status: Home / Self Care | Payer: PPO | Source: Ambulatory Visit | Attending: Internal Medicine | Admitting: Internal Medicine

## 2018-01-03 DIAGNOSIS — M79661 Pain in right lower leg: Secondary | ICD-10-CM | POA: Insufficient documentation

## 2018-01-03 DIAGNOSIS — M7989 Other specified soft tissue disorders: Secondary | ICD-10-CM | POA: Diagnosis not present

## 2018-01-03 NOTE — Progress Notes (Signed)
Right lower extremity venous duplex completed. Preliminary results - There is no evidence of a DVT, superficial thrombosis, or Baker's cyst. Toma Copier, RVS 01/03/2018 10:45 AM

## 2018-01-03 NOTE — Telephone Encounter (Signed)
[place] at [facility] - RLE [place] for DVT was negative.

## 2018-01-27 ENCOUNTER — Encounter (HOSPITAL_COMMUNITY): Payer: Self-pay | Admitting: Family Medicine

## 2018-01-27 ENCOUNTER — Ambulatory Visit (HOSPITAL_COMMUNITY)
Admission: EM | Admit: 2018-01-27 | Discharge: 2018-01-27 | Disposition: A | Discharge status: Home / Self Care | Payer: PPO | Attending: Internal Medicine | Admitting: Internal Medicine

## 2018-01-27 DIAGNOSIS — E785 Hyperlipidemia, unspecified: Secondary | ICD-10-CM | POA: Insufficient documentation

## 2018-01-27 DIAGNOSIS — Z8249 Family history of ischemic heart disease and other diseases of the circulatory system: Secondary | ICD-10-CM | POA: Insufficient documentation

## 2018-01-27 DIAGNOSIS — F329 Major depressive disorder, single episode, unspecified: Secondary | ICD-10-CM | POA: Diagnosis not present

## 2018-01-27 DIAGNOSIS — Z791 Long term (current) use of non-steroidal anti-inflammatories (NSAID): Secondary | ICD-10-CM | POA: Diagnosis not present

## 2018-01-27 DIAGNOSIS — Z882 Allergy status to sulfonamides status: Secondary | ICD-10-CM | POA: Insufficient documentation

## 2018-01-27 DIAGNOSIS — J45909 Unspecified asthma, uncomplicated: Secondary | ICD-10-CM | POA: Insufficient documentation

## 2018-01-27 DIAGNOSIS — N138 Other obstructive and reflux uropathy: Secondary | ICD-10-CM | POA: Diagnosis not present

## 2018-01-27 DIAGNOSIS — Z87891 Personal history of nicotine dependence: Secondary | ICD-10-CM | POA: Insufficient documentation

## 2018-01-27 DIAGNOSIS — G4733 Obstructive sleep apnea (adult) (pediatric): Secondary | ICD-10-CM | POA: Insufficient documentation

## 2018-01-27 DIAGNOSIS — K219 Gastro-esophageal reflux disease without esophagitis: Secondary | ICD-10-CM | POA: Diagnosis not present

## 2018-01-27 DIAGNOSIS — J309 Allergic rhinitis, unspecified: Secondary | ICD-10-CM | POA: Insufficient documentation

## 2018-01-27 DIAGNOSIS — Z8673 Personal history of transient ischemic attack (TIA), and cerebral infarction without residual deficits: Secondary | ICD-10-CM | POA: Insufficient documentation

## 2018-01-27 DIAGNOSIS — Z881 Allergy status to other antibiotic agents status: Secondary | ICD-10-CM | POA: Insufficient documentation

## 2018-01-27 DIAGNOSIS — N401 Enlarged prostate with lower urinary tract symptoms: Secondary | ICD-10-CM | POA: Diagnosis not present

## 2018-01-27 DIAGNOSIS — N189 Chronic kidney disease, unspecified: Secondary | ICD-10-CM | POA: Diagnosis not present

## 2018-01-27 DIAGNOSIS — M353 Polymyalgia rheumatica: Secondary | ICD-10-CM | POA: Diagnosis not present

## 2018-01-27 DIAGNOSIS — J029 Acute pharyngitis, unspecified: Secondary | ICD-10-CM

## 2018-01-27 DIAGNOSIS — Z96659 Presence of unspecified artificial knee joint: Secondary | ICD-10-CM | POA: Diagnosis not present

## 2018-01-27 DIAGNOSIS — K222 Esophageal obstruction: Secondary | ICD-10-CM | POA: Insufficient documentation

## 2018-01-27 DIAGNOSIS — K0889 Other specified disorders of teeth and supporting structures: Secondary | ICD-10-CM | POA: Insufficient documentation

## 2018-01-27 DIAGNOSIS — Z79899 Other long term (current) drug therapy: Secondary | ICD-10-CM | POA: Insufficient documentation

## 2018-01-27 DIAGNOSIS — I739 Peripheral vascular disease, unspecified: Secondary | ICD-10-CM | POA: Diagnosis not present

## 2018-01-27 DIAGNOSIS — I129 Hypertensive chronic kidney disease with stage 1 through stage 4 chronic kidney disease, or unspecified chronic kidney disease: Secondary | ICD-10-CM | POA: Diagnosis not present

## 2018-01-27 DIAGNOSIS — M109 Gout, unspecified: Secondary | ICD-10-CM | POA: Insufficient documentation

## 2018-01-27 LAB — POCT RAPID STREP A: STREPTOCOCCUS, GROUP A SCREEN (DIRECT): NEGATIVE

## 2018-01-27 MED ORDER — FLUTICASONE PROPIONATE 50 MCG/ACT NA SUSP: 2.0000 | Freq: Every day | NASAL | 0 refills | Status: DC

## 2018-01-27 MED ORDER — CETIRIZINE HCL 10 MG PO TABS: 10.0000 mg | ORAL_TABLET | Freq: Every day | ORAL | 0 refills | Status: DC

## 2018-01-27 MED ORDER — MELOXICAM 7.5 MG PO TABS: 7.5000 mg | ORAL_TABLET | Freq: Every day | ORAL | 0 refills | Status: DC

## 2018-01-27 MED ORDER — AMOXICILLIN-POT CLAVULANATE 875-125 MG PO TABS: 1.0000 | ORAL_TABLET | Freq: Two times a day (BID) | ORAL | 0 refills | Status: DC

## 2018-01-27 MED ORDER — CHLORHEXIDINE GLUCONATE 0.12 % MT SOLN: 15.0000 mL | Freq: Two times a day (BID) | OROMUCOSAL | 0 refills | Status: DC

## 2018-01-27 NOTE — Discharge Instructions (Signed)
Rapid strep negative. Symptoms are most likely due to viral illness/ drainage down your throat. Flonase, Zyrtec for nasal congestion/drainage. You can use over the counter nasal saline rinse such as neti pot for nasal congestion. Monitor for any worsening of symptoms, swelling of the throat, trouble breathing, trouble swallowing, leaning forward to breath, drooling, go to the emergency department for further evaluation needed.  For sore throat/cough try using a honey-based tea. Use 3 teaspoons of honey with juice squeezed from half lemon. Place shaved pieces of ginger into 1/2-1 cup of water and warm over stove top. Then mix the ingredients and repeat every 4 hours as needed.   Start Augmentin as directed for dental infection. Mobic and oragel for pain. Follow up with dentist for further treatment and evaluation. If experiencing swelling of the throat, trouble breathing, trouble swallowing, leaning forward to breath, drooling, go to the emergency department for further evaluation.

## 2018-01-27 NOTE — ED Provider Notes (Signed)
[place]    CSN: [id number] Arrival date & time: 01/27/18  1317     History   Chief Complaint Chief Complaint  Patient presents with  . Sore Throat  . Dental Pain    HPI Troy Palmer is a 72 y.o. male.   72 year old male with history of allergic rhinitis, asthma,  esophageal stricture, GERD, comes in for 3-day history of sore throat.  States feels dry/scratchy, with painful swallowing.  Denies swelling of the throat, trouble breathing, tripoding, drooling.  Denies rhinorrhea, nasal congestion.  Mild cough.  Denies fever, chills, night sweats.  Denies sneezing.  OTC cold medicine without relief.  He is also had intermittent right upper molar pain that has been more constant the past few days.  States he has not seen a dentist in quite a while, and does not know if there is a broken tooth/cavity.  States is hard to  chew on that side.  Denies facial swelling.      Past Medical History:  Diagnosis Date  . ALLERGIC RHINITIS 04/09/2007  . ASTHMA 12/04/2007  . BACK PAIN 09/16/2009  . BENIGN PROSTATIC HYPERTROPHY 04/09/2007  . CEREBROVASCULAR ACCIDENT, HX OF 07/17/2010  . COLONIC POLYPS, HX OF 12/04/2007  . CONSTIPATION 09/25/2010  . Degeneration of cervical intervertebral disc 03/28/2007  . DEPRESSION 12/04/2007  . DIVERTICULOSIS, COLON 12/04/2007  . DYSPHAGIA UNSPECIFIED 03/16/2008  . ERECTILE DYSFUNCTION 04/09/2007  . ESOPHAGEAL STRICTURE 05/26/2008  . GERD 12/04/2007  . HEMORRHOIDS, RECURRENT 02/11/2008  . HYPERLIPIDEMIA 12/04/2007  . HYPERTENSION 03/28/2007  . LEG PAIN, BILATERAL 01/25/2010  . LOW BACK PAIN 04/09/2007  . LUNG NODULE 12/04/2007  . Osteoarth NOS-Unspec 03/28/2007  . Other dysphagia 11/21/2009  . POLYARTHRALGIA 07/13/2008  . Polymyalgia rheumatica ([place]) 07/26/2008  . PVD WITH CLAUDICATION 02/10/2010  . RENAL INSUFFICIENCY 03/15/2010  . SLEEP APNEA, OBSTRUCTIVE, MODERATE 04/09/2007  . TB SKIN TEST, POSITIVE 03/28/2007    Patient Active Problem List   Diagnosis Date Noted  . Pain and swelling of lower leg, right 12/31/2017  . BPH with obstruction/lower urinary tract symptoms 05/16/2017  . Bilateral ankle pain 05/16/2017  . Acute gout 02/26/2017  . Dyspnea 11/16/2016  . Complete tear of right rotator cuff 06/26/2016  . Cellulitis of deltoid region 06/26/2016  . Greater trochanteric bursitis of right hip 05/05/2014  . Right hip pain 05/04/2014  . Posterior neck pain 12/22/2013  . Orchitis, right 12/22/2013  . Paresthesia of both feet 12/22/2013  . Bilateral shoulder pain 09/16/2013  . Allergic angioedema 07/31/2012  . Impaired glucose tolerance 04/15/2012  . Chest pain 02/24/2012  . Foot pain, bilateral 02/20/2012  . Leg pain, bilateral 01/19/2012  . Edema 01/16/2012  . Dysphagia 11/21/2011  . Gout 06/07/2011  . CKD (chronic kidney disease) 06/07/2011  . Wellness examination 02/14/2011  . Encounter for long-term (current) use of other medications 11/08/2010  . CONSTIPATION 09/25/2010  . Memory loss 06/28/2010  . PVD (peripheral vascular disease) ([place]) 02/10/2010  . LEG PAIN, BILATERAL 01/25/2010  . Backache 09/16/2009  . Polymyalgia rheumatica ([place]) 07/26/2008  . POLYARTHRALGIA 07/13/2008  . ESOPHAGEAL STRICTURE 05/26/2008  . HEMORRHOIDS, RECURRENT 02/11/2008  . Hyperlipidemia 12/04/2007  . DEPRESSION 12/04/2007  . ASTHMA 12/04/2007  . LUNG NODULE 12/04/2007  . GERD 12/04/2007  . DIVERTICULOSIS, COLON 12/04/2007  . COLONIC POLYPS, HX OF 12/04/2007  . ERECTILE DYSFUNCTION 04/09/2007  . SLEEP APNEA, OBSTRUCTIVE, MODERATE 04/09/2007  . Allergic rhinitis 04/09/2007  . BENIGN PROSTATIC HYPERTROPHY 04/09/2007  . LOW BACK  PAIN 04/09/2007  . OBESITY 03/28/2007  . Essential hypertension 03/28/2007  . Osteoarth NOS-Unspec 03/28/2007  . Degeneration of cervical intervertebral disc 03/28/2007  . TB SKIN TEST, POSITIVE 03/28/2007    Past Surgical History:  Procedure Laterality Date  . ROTATOR CUFF REPAIR     Left  . TOTAL  KNEE ARTHROPLASTY     x 2       Home Medications    Prior to Admission medications   Medication Sig Start Date End Date Taking? Authorizing Provider  allopurinol (ZYLOPRIM) 100 MG tablet Take 1 tablet (100 mg total) by mouth daily. 12/06/17   Biagio Borg, MD  amLODipine (NORVASC) 5 MG tablet Take 1 tablet (5 mg total) by mouth daily. 11/19/17   Biagio Borg, MD  amoxicillin-clavulanate (AUGMENTIN) 875-125 MG tablet Take 1 tablet by mouth every 12 (twelve) hours. 01/27/18   Tasia Catchings, Judson Tsan V, PA-C  atorvastatin (LIPITOR) 40 MG tablet Take 1 tablet (40 mg total) by mouth daily. 10/09/17   Biagio Borg, MD  cetirizine (ZYRTEC) 10 MG tablet Take 1 tablet (10 mg total) by mouth daily. 01/27/18   Tasia Catchings, Bathsheba Durrett V, PA-C  chlorhexidine (PERIDEX) 0.12 % solution Use as directed 15 mLs in the mouth or throat 2 (two) times daily. 01/27/18   Tasia Catchings, Orville Widmann V, PA-C  colchicine 0.6 MG tablet Take 1 tablet (0.6 mg total) by mouth 2 (two) times daily. 11/05/17   Biagio Borg, MD  fluticasone (FLONASE) 50 MCG/ACT nasal spray Place 2 sprays into both nostrils daily. 01/27/18   Tasia Catchings, Vincenzo Stave V, PA-C  furosemide (LASIX) 40 MG tablet take 1 tablet by mouth every morning AND 1 IN THE EVENING AS NEEDED FOR SWELLING ONLY 06/01/17   Biagio Borg, MD  gabapentin (NEURONTIN) 100 MG capsule Take 1 capsule (100 mg total) by mouth 3 (three) times daily. 10/18/17   Biagio Borg, MD  meloxicam (MOBIC) 7.5 MG tablet Take 1 tablet (7.5 mg total) by mouth daily. 01/27/18   Tasia Catchings, Shawntay Prest V, PA-C  naphazoline-pheniramine (NAPHCON-A) 0.025-0.3 % ophthalmic solution Place 1 drop into both eyes 4 (four) times daily as needed for irritation.    [provider]  nebivolol (BYSTOLIC) 10 MG tablet Take 2 tablets (20 mg total) by mouth daily. 11/20/17   Biagio Borg, MD  pantoprazole (PROTONIX) 40 MG tablet Take 1 tablet (40 mg total) by mouth daily. 08/16/17 08/16/18  Biagio Borg, MD  tamsulosin [facility]) 0.4 MG CAPS capsule 1 tab by mouth twice per day 05/16/17    Biagio Borg, MD  traMADol (ULTRAM) 50 MG tablet Take 1 tablet (50 mg total) by mouth every 8 (eight) hours as needed. 10/18/17   Biagio Borg, MD    Family History Family History  Problem Relation Age of Onset  . Heart disease Father   . Lupus Brother   . Hypertension Brother   . Asthma Other     Social History Social History   Tobacco Use  . Smoking status: Former Research scientist (life sciences)  . Smokeless tobacco: Never Used  Substance Use Topics  . Alcohol use: No  . Drug use: No     Allergies   Ace inhibitors; Doxycycline; and Sulfa antibiotics   Review of Systems Review of Systems  Reason unable to perform ROS: See HPI as above.     Physical Exam Triage Vital Signs ED Triage Vitals [01/27/18 1351]  Enc Vitals Group     BP (!) 141/61     Pulse  Rate 66     Resp 18     Temp 99.3 F (37.4 C)     Temp src      SpO2 96 %     Weight      Height      Head Circumference      Peak Flow      Pain Score      Pain Loc      Pain Edu?      Excl. in [place]?    No data found.  Updated Vital Signs BP (!) 141/61   Pulse 66   Temp 99.3 F (37.4 C)   Resp 18   SpO2 96%   Physical Exam  Constitutional: He is oriented to person, place, and time. He appears well-developed and well-nourished.  Non-toxic appearance. He does not appear ill. No distress.  HENT:  Head: Normocephalic and atraumatic.  Right Ear: Tympanic membrane, external ear and ear canal normal. Tympanic membrane is not erythematous and not bulging.  Left Ear: Tympanic membrane, external ear and ear canal normal. Tympanic membrane is not erythematous and not bulging.  Nose: Nose normal. Right sinus exhibits no maxillary sinus tenderness and no frontal sinus tenderness. Left sinus exhibits no maxillary sinus tenderness and no frontal sinus tenderness.  Mouth/Throat: Uvula is midline, oropharynx is clear and moist and mucous membranes are normal. No trismus in the jaw. No uvula swelling. No tonsillar exudate.    Overall poor  dentition.  Cavity to the right upper molar.  Tenderness to palpation along gumline.  No obvious fluctuance felt.   Floor of mouth soft to palpation. No obvious facial swelling.   Eyes: Pupils are equal, round, and reactive to light. Conjunctivae are normal.  Neck: Normal range of motion. Neck supple.  Cardiovascular: Normal rate, regular rhythm and normal heart sounds. Exam reveals no gallop and no friction rub.  No murmur heard. Pulmonary/Chest: Effort normal and breath sounds normal. He has no decreased breath sounds. He has no wheezes. He has no rhonchi. He has no rales.  Lymphadenopathy:    He has no cervical adenopathy.  Neurological: He is alert and oriented to person, place, and time.  Skin: Skin is warm and dry. He is not diaphoretic.  Psychiatric: He has a normal mood and affect. His behavior is normal. Judgment normal.     UC Treatments / Results  Labs (all labs ordered are listed, but only abnormal results are displayed) Labs Reviewed  CULTURE, GROUP A STREP [facility])  POCT RAPID STREP A    EKG None  Radiology No results found.  Procedures Procedures (including critical care time)  Medications Ordered in UC Medications - No data to display  Initial Impression / Assessment and Plan / UC Course  I have reviewed the triage vital signs and the nursing notes.  Pertinent labs & imaging results that were available during my care of the patient were reviewed by me and considered in my medical decision making (see chart for details).    Rapid strep negative. Patient is nontoxic in appearance. Symptomatic treatment as needed. Return precautions given.   Start antibiotics for possible dental infection. Symptomatic treatment as needed. Discussed with patient symptoms can return if dental problem is not addressed. Follow up with dentist for further evaluation and treatment of dental pain. Resources given. Return precautions given.   Final Clinical Impressions(s) / UC  Diagnoses   Final diagnoses:  Pain, dental  Sore throat    ED Prescriptions    Medication  Sig Dispense Auth. Provider   amoxicillin-clavulanate (AUGMENTIN) 875-125 MG tablet Take 1 tablet by mouth every 12 (twelve) hours. 14 tablet Sagrario Lineberry V, PA-C   fluticasone (FLONASE) 50 MCG/ACT nasal spray Place 2 sprays into both nostrils daily. 1 g Maddex Garlitz V, PA-C   cetirizine (ZYRTEC) 10 MG tablet Take 1 tablet (10 mg total) by mouth daily. 15 tablet Porchia Sinkler V, PA-C   meloxicam (MOBIC) 7.5 MG tablet Take 1 tablet (7.5 mg total) by mouth daily. 15 tablet Damin Salido V, PA-C   chlorhexidine (PERIDEX) 0.12 % solution Use as directed 15 mLs in the mouth or throat 2 (two) times daily. 120 mL Tobin Chad, [place] 01/27/18 1556

## 2018-01-27 NOTE — ED Triage Notes (Signed)
Pt here for dry, sore throat since Friday. He is also complaining of right upper dental pain.

## 2018-01-29 LAB — CULTURE, GROUP A STREP (THRC)

## 2018-02-03 ENCOUNTER — Encounter: Payer: Self-pay | Admitting: Internal Medicine

## 2018-02-03 ENCOUNTER — Ambulatory Visit (INDEPENDENT_AMBULATORY_CARE_PROVIDER_SITE_OTHER): Payer: PPO | Admitting: Internal Medicine

## 2018-02-03 VITALS — BP 118/74 | HR 63 | Temp 99.6°F | Ht 70.0 in | Wt 283.0 lb

## 2018-02-03 DIAGNOSIS — R062 Wheezing: Secondary | ICD-10-CM

## 2018-02-03 DIAGNOSIS — K0889 Other specified disorders of teeth and supporting structures: Secondary | ICD-10-CM | POA: Diagnosis not present

## 2018-02-03 DIAGNOSIS — R05 Cough: Secondary | ICD-10-CM | POA: Diagnosis not present

## 2018-02-03 DIAGNOSIS — R059 Cough, unspecified: Secondary | ICD-10-CM

## 2018-02-03 MED ORDER — PREDNISONE 10 MG PO TABS: ORAL_TABLET | ORAL | 0 refills | Status: DC

## 2018-02-03 MED ORDER — HYDROCODONE-HOMATROPINE 5-1.5 MG/5ML PO SYRP: 5.0000 mL | ORAL_SOLUTION | Freq: Four times a day (QID) | ORAL | 0 refills | Status: AC | PRN

## 2018-02-03 MED ORDER — CEFDINIR 300 MG PO CAPS: 300.0000 mg | ORAL_CAPSULE | Freq: Two times a day (BID) | ORAL | 0 refills | Status: DC

## 2018-02-03 NOTE — Assessment & Plan Note (Signed)
Some improved, cont to follow, pt plans to f/u with dental soon

## 2018-02-03 NOTE — Patient Instructions (Addendum)
Please take all new medication as prescribed- the antibiotic, cough medicine if needed, and prednisone  Please continue all other medications as before, and refills have been done if requested.  Please have the pharmacy call with any other refills you may need.  Please continue your efforts at being more active, low cholesterol diet, and weight control.  Please keep your appointments with your specialists as you may have planned

## 2018-02-03 NOTE — Assessment & Plan Note (Signed)
Mild to mod, for prednisone asd, declines inhaler prn,  to f/u any worsening symptoms or concerns

## 2018-02-03 NOTE — Progress Notes (Signed)
Subjective:    Patient ID: Troy Palmer, male    DOB: 1946-08-04, 72 y.o.   MRN: [id number]  HPI  Here to f/u after seen in ED 6/17 with dental infection and ST, tx with augmentin, flonase, zyrtec, mobic and peridex asd.  Rapid strep neg at visit.  Unfortunately pt was unable to tolerate the augmentin as for some reason experienced marked weakness after 2 doses the first 2 days, then again on third day as well so had to stop, with less weakness but still feels ill, now with increased scant prod cough and wheeziness worse at night with lying down, and mild sob with higher exertions during the day.  Just does not feel good.  Low grade temp persists and noted today Past Medical History:  Diagnosis Date  . ALLERGIC RHINITIS 04/09/2007  . ASTHMA 12/04/2007  . BACK PAIN 09/16/2009  . BENIGN PROSTATIC HYPERTROPHY 04/09/2007  . CEREBROVASCULAR ACCIDENT, HX OF 07/17/2010  . COLONIC POLYPS, HX OF 12/04/2007  . CONSTIPATION 09/25/2010  . Degeneration of cervical intervertebral disc 03/28/2007  . DEPRESSION 12/04/2007  . DIVERTICULOSIS, COLON 12/04/2007  . DYSPHAGIA UNSPECIFIED 03/16/2008  . ERECTILE DYSFUNCTION 04/09/2007  . ESOPHAGEAL STRICTURE 05/26/2008  . GERD 12/04/2007  . HEMORRHOIDS, RECURRENT 02/11/2008  . HYPERLIPIDEMIA 12/04/2007  . HYPERTENSION 03/28/2007  . LEG PAIN, BILATERAL 01/25/2010  . LOW BACK PAIN 04/09/2007  . LUNG NODULE 12/04/2007  . Osteoarth NOS-Unspec 03/28/2007  . Other dysphagia 11/21/2009  . POLYARTHRALGIA 07/13/2008  . Polymyalgia rheumatica ([place]) 07/26/2008  . PVD WITH CLAUDICATION 02/10/2010  . RENAL INSUFFICIENCY 03/15/2010  . SLEEP APNEA, OBSTRUCTIVE, MODERATE 04/09/2007  . TB SKIN TEST, POSITIVE 03/28/2007   Past Surgical History:  Procedure Laterality Date  . ROTATOR CUFF REPAIR     Left  . TOTAL KNEE ARTHROPLASTY     x 2    reports that he has quit smoking. He has never used smokeless tobacco. He reports that he does not drink alcohol or use drugs. family history includes  Asthma in his other; Heart disease in his father; Hypertension in his brother; Lupus in his brother. Allergies  Allergen Reactions  . Ace Inhibitors Swelling    Angioedema throat  . Augmentin [Amoxicillin-Pot Clavulanate] Other (See Comments)    Marked weakness with po med x 3 doses  . Doxycycline     Some GI upset  . Sulfa Antibiotics Hives    And facial angioedema   Current Outpatient Medications on File Prior to Visit  Medication Sig Dispense Refill  . allopurinol (ZYLOPRIM) 100 MG tablet Take 1 tablet (100 mg total) by mouth daily. 90 tablet 0  . amLODipine (NORVASC) 5 MG tablet Take 1 tablet (5 mg total) by mouth daily. 30 tablet 5  . amoxicillin-clavulanate (AUGMENTIN) 875-125 MG tablet Take 1 tablet by mouth every 12 (twelve) hours. 14 tablet 0  . atorvastatin (LIPITOR) 40 MG tablet Take 1 tablet (40 mg total) by mouth daily. 90 tablet 1  . cetirizine (ZYRTEC) 10 MG tablet Take 1 tablet (10 mg total) by mouth daily. 15 tablet 0  . chlorhexidine (PERIDEX) 0.12 % solution Use as directed 15 mLs in the mouth or throat 2 (two) times daily. 120 mL 0  . colchicine 0.6 MG tablet Take 1 tablet (0.6 mg total) by mouth 2 (two) times daily. 60 tablet 5  . fluticasone (FLONASE) 50 MCG/ACT nasal spray Place 2 sprays into both nostrils daily. 1 g 0  . furosemide (LASIX) 40 MG tablet take 1 tablet  by mouth every morning AND 1 IN THE EVENING AS NEEDED FOR SWELLING ONLY 60 tablet 11  . gabapentin (NEURONTIN) 100 MG capsule Take 1 capsule (100 mg total) by mouth 3 (three) times daily. 90 capsule 5  . meloxicam (MOBIC) 7.5 MG tablet Take 1 tablet (7.5 mg total) by mouth daily. 15 tablet 0  . naphazoline-pheniramine (NAPHCON-A) 0.025-0.3 % ophthalmic solution Place 1 drop into both eyes 4 (four) times daily as needed for irritation.    . nebivolol (BYSTOLIC) 10 MG tablet Take 2 tablets (20 mg total) by mouth daily. 60 tablet 5  . pantoprazole (PROTONIX) 40 MG tablet Take 1 tablet (40 mg total) by mouth  daily. 90 tablet 3  . tamsulosin (FLOMAX) 0.4 MG CAPS capsule 1 tab by mouth twice per day 180 capsule 3  . traMADol (ULTRAM) 50 MG tablet Take 1 tablet (50 mg total) by mouth every 8 (eight) hours as needed. 60 tablet 1   No current facility-administered medications on file prior to visit.    Review of Systems  Constitutional: Negative for other unusual diaphoresis or sweats HENT: Negative for ear discharge or swelling Eyes: Negative for other worsening visual disturbances Respiratory: Negative for stridor or other swelling  Gastrointestinal: Negative for worsening distension or other blood Genitourinary: Negative for retention or other urinary change Musculoskeletal: Negative for other MSK pain or swelling Skin: Negative for color change or other new lesions Neurological: Negative for worsening tremors and other numbness  Psychiatric/Behavioral: Negative for worsening agitation or other fatigue All other system neg per pt    Objective:   Physical Exam BP 118/74   Pulse 63   Temp 99.6 F (37.6 C) (Oral)   Ht 5\' 10"  (1.778 m)   Wt 283 lb (128.4 kg)   SpO2 97%   BMI 40.61 kg/m  VS noted, mild ill, fatigued appaering Constitutional: Pt appears in NAD HENT: Head: NCAT.  Right Ear: External ear normal.  Left Ear: External ear normal.  Eyes: . Pupils are equal, round, and reactive to light. Conjunctivae and EOM are normal Nose: without d/c or deformity Bilat tm's with mild erythema.  Max sinus areas non tender.  Pharynx with mild erythema, no exudate Neck: Neck supple. Gross normal ROM Cardiovascular: Normal rate and regular rhythm.   Pulmonary/Chest: Effort normal and breath sounds decreased without rales with rare wheezing.  Neurological: Pt is alert. At baseline orientation, motor grossly intact Skin: Skin is warm. No rashes, other new lesions, no LE edema Psychiatric: Pt behavior is normal without agitation  No other exam findings Lab Results  Component Value Date   WBC  5.0 08/16/2017   HGB 12.8 (L) 08/16/2017   HCT 40.1 08/16/2017   PLT 210.0 08/16/2017   GLUCOSE 89 08/16/2017   CHOL 147 08/16/2017   TRIG 69.0 08/16/2017   HDL 40.00 08/16/2017   LDLDIRECT 156.9 10/10/2010   LDLCALC 93 08/16/2017   ALT 15 08/16/2017   AST 16 08/16/2017   NA 143 08/16/2017   K 4.0 08/16/2017   CL 107 08/16/2017   CREATININE 1.93 (H) 08/16/2017   BUN 26 (H) 08/16/2017   CO2 30 08/16/2017   TSH 2.09 08/16/2017   PSA 0.98 08/16/2017   HGBA1C 5.7 08/16/2017         Assessment & Plan:

## 2018-02-03 NOTE — Assessment & Plan Note (Signed)
Mild to mod, c/w bronchitis vs pna, declines cxr, for antibx course,  Cough med prn, to f/u any worsening symptoms or concerns

## 2018-02-09 ENCOUNTER — Encounter (HOSPITAL_COMMUNITY): Payer: Self-pay | Admitting: Emergency Medicine

## 2018-02-09 ENCOUNTER — Observation Stay (HOSPITAL_COMMUNITY)
Admission: EM | Admit: 2018-02-09 | Discharge: 2018-02-10 | Disposition: A | Discharge status: Home / Self Care | Payer: PPO | Attending: Nephrology | Admitting: Nephrology

## 2018-02-09 ENCOUNTER — Other Ambulatory Visit: Payer: Self-pay

## 2018-02-09 ENCOUNTER — Emergency Department (HOSPITAL_COMMUNITY): Payer: PPO

## 2018-02-09 DIAGNOSIS — N189 Chronic kidney disease, unspecified: Secondary | ICD-10-CM | POA: Diagnosis present

## 2018-02-09 DIAGNOSIS — N183 Chronic kidney disease, stage 3 unspecified: Secondary | ICD-10-CM | POA: Diagnosis present

## 2018-02-09 DIAGNOSIS — G4733 Obstructive sleep apnea (adult) (pediatric): Secondary | ICD-10-CM | POA: Insufficient documentation

## 2018-02-09 DIAGNOSIS — E785 Hyperlipidemia, unspecified: Secondary | ICD-10-CM | POA: Insufficient documentation

## 2018-02-09 DIAGNOSIS — J9601 Acute respiratory failure with hypoxia: Principal | ICD-10-CM | POA: Diagnosis present

## 2018-02-09 DIAGNOSIS — K219 Gastro-esophageal reflux disease without esophagitis: Secondary | ICD-10-CM | POA: Diagnosis not present

## 2018-02-09 DIAGNOSIS — J45909 Unspecified asthma, uncomplicated: Secondary | ICD-10-CM | POA: Diagnosis present

## 2018-02-09 DIAGNOSIS — Z888 Allergy status to other drugs, medicaments and biological substances status: Secondary | ICD-10-CM | POA: Insufficient documentation

## 2018-02-09 DIAGNOSIS — Z8249 Family history of ischemic heart disease and other diseases of the circulatory system: Secondary | ICD-10-CM | POA: Diagnosis not present

## 2018-02-09 DIAGNOSIS — Z881 Allergy status to other antibiotic agents status: Secondary | ICD-10-CM | POA: Diagnosis not present

## 2018-02-09 DIAGNOSIS — M109 Gout, unspecified: Secondary | ICD-10-CM | POA: Diagnosis not present

## 2018-02-09 DIAGNOSIS — E7800 Pure hypercholesterolemia, unspecified: Secondary | ICD-10-CM | POA: Diagnosis present

## 2018-02-09 DIAGNOSIS — Z79899 Other long term (current) drug therapy: Secondary | ICD-10-CM | POA: Insufficient documentation

## 2018-02-09 DIAGNOSIS — Z6841 Body Mass Index (BMI) 40.0 and over, adult: Secondary | ICD-10-CM | POA: Insufficient documentation

## 2018-02-09 DIAGNOSIS — Z880 Allergy status to penicillin: Secondary | ICD-10-CM | POA: Insufficient documentation

## 2018-02-09 DIAGNOSIS — M353 Polymyalgia rheumatica: Secondary | ICD-10-CM | POA: Insufficient documentation

## 2018-02-09 DIAGNOSIS — R202 Paresthesia of skin: Secondary | ICD-10-CM | POA: Diagnosis present

## 2018-02-09 DIAGNOSIS — Z7951 Long term (current) use of inhaled steroids: Secondary | ICD-10-CM | POA: Insufficient documentation

## 2018-02-09 DIAGNOSIS — J449 Chronic obstructive pulmonary disease, unspecified: Secondary | ICD-10-CM | POA: Diagnosis not present

## 2018-02-09 DIAGNOSIS — E669 Obesity, unspecified: Secondary | ICD-10-CM | POA: Insufficient documentation

## 2018-02-09 DIAGNOSIS — R0602 Shortness of breath: Secondary | ICD-10-CM | POA: Diagnosis not present

## 2018-02-09 DIAGNOSIS — I129 Hypertensive chronic kidney disease with stage 1 through stage 4 chronic kidney disease, or unspecified chronic kidney disease: Secondary | ICD-10-CM | POA: Insufficient documentation

## 2018-02-09 DIAGNOSIS — N184 Chronic kidney disease, stage 4 (severe): Secondary | ICD-10-CM | POA: Diagnosis present

## 2018-02-09 DIAGNOSIS — R05 Cough: Secondary | ICD-10-CM | POA: Diagnosis not present

## 2018-02-09 DIAGNOSIS — Z87891 Personal history of nicotine dependence: Secondary | ICD-10-CM | POA: Diagnosis not present

## 2018-02-09 DIAGNOSIS — Z882 Allergy status to sulfonamides status: Secondary | ICD-10-CM | POA: Insufficient documentation

## 2018-02-09 DIAGNOSIS — Z8673 Personal history of transient ischemic attack (TIA), and cerebral infarction without residual deficits: Secondary | ICD-10-CM | POA: Insufficient documentation

## 2018-02-09 DIAGNOSIS — Z96659 Presence of unspecified artificial knee joint: Secondary | ICD-10-CM | POA: Diagnosis not present

## 2018-02-09 DIAGNOSIS — M2550 Pain in unspecified joint: Secondary | ICD-10-CM | POA: Diagnosis present

## 2018-02-09 DIAGNOSIS — I739 Peripheral vascular disease, unspecified: Secondary | ICD-10-CM | POA: Insufficient documentation

## 2018-02-09 DIAGNOSIS — Z825 Family history of asthma and other chronic lower respiratory diseases: Secondary | ICD-10-CM | POA: Diagnosis not present

## 2018-02-09 DIAGNOSIS — I10 Essential (primary) hypertension: Secondary | ICD-10-CM | POA: Diagnosis present

## 2018-02-09 LAB — URINALYSIS, ROUTINE W REFLEX MICROSCOPIC
Bilirubin Urine: NEGATIVE
GLUCOSE, UA: NEGATIVE mg/dL
HGB URINE DIPSTICK: NEGATIVE
KETONES UR: NEGATIVE mg/dL
LEUKOCYTES UA: NEGATIVE
NITRITE: NEGATIVE
Protein, ur: 30 mg/dL — AB
Specific Gravity, Urine: 1.013 (ref 1.005–1.030)
pH: 5 (ref 5.0–8.0)

## 2018-02-09 LAB — CBC WITH DIFFERENTIAL/PLATELET
ABS IMMATURE GRANULOCYTES: 0.6 10*3/uL — AB (ref 0.0–0.1)
BASOS ABS: 0.1 10*3/uL (ref 0.0–0.1)
Basophils Relative: 0 %
Eosinophils Absolute: 0.2 10*3/uL (ref 0.0–0.7)
Eosinophils Relative: 2 %
HCT: 40.2 % (ref 39.0–52.0)
Hemoglobin: 12.5 g/dL — ABNORMAL LOW (ref 13.0–17.0)
IMMATURE GRANULOCYTES: 4 %
Lymphocytes Relative: 18 %
Lymphs Abs: 2.5 10*3/uL (ref 0.7–4.0)
MCH: 26.5 pg (ref 26.0–34.0)
MCHC: 31.1 g/dL (ref 30.0–36.0)
MCV: 85.2 fL (ref 78.0–100.0)
Monocytes Absolute: 1.1 10*3/uL — ABNORMAL HIGH (ref 0.1–1.0)
Monocytes Relative: 8 %
NEUTROS ABS: 9.4 10*3/uL — AB (ref 1.7–7.7)
NEUTROS PCT: 68 %
Platelets: 403 10*3/uL — ABNORMAL HIGH (ref 150–400)
RBC: 4.72 MIL/uL (ref 4.22–5.81)
RDW: 13.4 % (ref 11.5–15.5)
WBC: 13.9 10*3/uL — AB (ref 4.0–10.5)

## 2018-02-09 LAB — COMPREHENSIVE METABOLIC PANEL
ALBUMIN: 2.4 g/dL — AB (ref 3.5–5.0)
ALK PHOS: 80 U/L (ref 38–126)
ALT: 35 U/L (ref 0–44)
AST: 28 U/L (ref 15–41)
Anion gap: 9 (ref 5–15)
BUN: 51 mg/dL — ABNORMAL HIGH (ref 8–23)
CALCIUM: 8.4 mg/dL — AB (ref 8.9–10.3)
CO2: 24 mmol/L (ref 22–32)
CREATININE: 2.56 mg/dL — AB (ref 0.61–1.24)
Chloride: 109 mmol/L (ref 98–111)
GFR calc non Af Amer: 24 mL/min — ABNORMAL LOW (ref >60)
GFR, EST AFRICAN AMERICAN: 27 mL/min — AB (ref >60)
GLUCOSE: 114 mg/dL — AB (ref 70–99)
Potassium: 3.9 mmol/L (ref 3.5–5.1)
SODIUM: 142 mmol/L (ref 135–145)
TOTAL PROTEIN: 6.3 g/dL — AB (ref 6.5–8.1)
Total Bilirubin: 0.7 mg/dL (ref 0.3–1.2)

## 2018-02-09 LAB — EXPECTORATED SPUTUM ASSESSMENT W GRAM STAIN, RFLX TO RESP C

## 2018-02-09 LAB — HEMOGLOBIN A1C
HEMOGLOBIN A1C: 6.5 % — AB (ref 4.8–5.6)
Mean Plasma Glucose: 139.85 mg/dL

## 2018-02-09 LAB — GLUCOSE, CAPILLARY: Glucose-Capillary: 298 mg/dL — ABNORMAL HIGH (ref 70–99)

## 2018-02-09 MED ORDER — SODIUM CHLORIDE 0.9 % IV SOLN
INTRAVENOUS | Status: DC
  Administered 2018-02-09 – 2018-02-10 (×3): via INTRAVENOUS

## 2018-02-09 MED ORDER — ALLOPURINOL 100 MG PO TABS
100.0000 mg | ORAL_TABLET | Freq: Every day | ORAL | Status: DC
  Administered 2018-02-09 – 2018-02-10 (×2): 100 mg via ORAL
  Filled 2018-02-09 (×2): qty 1

## 2018-02-09 MED ORDER — ALBUTEROL SULFATE (2.5 MG/3ML) 0.083% IN NEBU: 2.5000 mg | INHALATION_SOLUTION | RESPIRATORY_TRACT | Status: DC | PRN

## 2018-02-09 MED ORDER — ACETAMINOPHEN 650 MG RE SUPP: 650.0000 mg | Freq: Four times a day (QID) | RECTAL | Status: DC | PRN

## 2018-02-09 MED ORDER — SODIUM CHLORIDE 0.9 % IV SOLN: 500.0000 mg | INTRAVENOUS | Status: DC

## 2018-02-09 MED ORDER — ALBUTEROL SULFATE (2.5 MG/3ML) 0.083% IN NEBU: 5.0000 mg | INHALATION_SOLUTION | Freq: Once | RESPIRATORY_TRACT | Status: DC

## 2018-02-09 MED ORDER — TAMSULOSIN HCL 0.4 MG PO CAPS
0.4000 mg | ORAL_CAPSULE | Freq: Every day | ORAL | Status: DC
  Administered 2018-02-09 – 2018-02-10 (×2): 0.4 mg via ORAL
  Filled 2018-02-09 (×2): qty 1

## 2018-02-09 MED ORDER — ACETAMINOPHEN 325 MG PO TABS: 650.0000 mg | ORAL_TABLET | Freq: Four times a day (QID) | ORAL | Status: DC | PRN

## 2018-02-09 MED ORDER — HYDROCODONE-HOMATROPINE 5-1.5 MG/5ML PO SYRP: 5.0000 mL | ORAL_SOLUTION | Freq: Four times a day (QID) | ORAL | Status: DC | PRN

## 2018-02-09 MED ORDER — SODIUM CHLORIDE 0.9 % IV SOLN
500.0000 mg | INTRAVENOUS | Status: DC
  Administered 2018-02-09: 500 mg via INTRAVENOUS
  Filled 2018-02-09 (×2): qty 500

## 2018-02-09 MED ORDER — GABAPENTIN 100 MG PO CAPS
100.0000 mg | ORAL_CAPSULE | Freq: Three times a day (TID) | ORAL | Status: DC
  Administered 2018-02-09 – 2018-02-10 (×4): 100 mg via ORAL
  Filled 2018-02-09 (×4): qty 1

## 2018-02-09 MED ORDER — PANTOPRAZOLE SODIUM 40 MG PO TBEC
40.0000 mg | DELAYED_RELEASE_TABLET | Freq: Every day | ORAL | Status: DC
  Administered 2018-02-09 – 2018-02-10 (×2): 40 mg via ORAL
  Filled 2018-02-09 (×2): qty 1

## 2018-02-09 MED ORDER — DEXTROSE 5 % IV SOLN
250.0000 mg | INTRAVENOUS | Status: DC
  Filled 2018-02-09: qty 250

## 2018-02-09 MED ORDER — ONDANSETRON HCL 4 MG/2ML IJ SOLN: 4.0000 mg | Freq: Four times a day (QID) | INTRAMUSCULAR | Status: DC | PRN

## 2018-02-09 MED ORDER — ATORVASTATIN CALCIUM 20 MG PO TABS
40.0000 mg | ORAL_TABLET | Freq: Every day | ORAL | Status: DC
  Administered 2018-02-09 – 2018-02-10 (×2): 40 mg via ORAL
  Filled 2018-02-09 (×2): qty 2

## 2018-02-09 MED ORDER — AMLODIPINE BESYLATE 5 MG PO TABS
5.0000 mg | ORAL_TABLET | Freq: Every day | ORAL | Status: DC
  Administered 2018-02-09 – 2018-02-10 (×2): 5 mg via ORAL
  Filled 2018-02-09 (×2): qty 1

## 2018-02-09 MED ORDER — NEBIVOLOL HCL 10 MG PO TABS
20.0000 mg | ORAL_TABLET | Freq: Every day | ORAL | Status: DC
  Administered 2018-02-09 – 2018-02-10 (×2): 20 mg via ORAL
  Filled 2018-02-09 (×2): qty 2

## 2018-02-09 MED ORDER — HEPARIN SODIUM (PORCINE) 5000 UNIT/ML IJ SOLN
5000.0000 [IU] | Freq: Three times a day (TID) | INTRAMUSCULAR | Status: DC
  Administered 2018-02-09 – 2018-02-10 (×3): 5000 [IU] via SUBCUTANEOUS
  Filled 2018-02-09 (×4): qty 1

## 2018-02-09 MED ORDER — SENNOSIDES-DOCUSATE SODIUM 8.6-50 MG PO TABS: 1.0000 | ORAL_TABLET | Freq: Every evening | ORAL | Status: DC | PRN

## 2018-02-09 MED ORDER — MELOXICAM 7.5 MG PO TABS: 7.5000 mg | ORAL_TABLET | Freq: Every day | ORAL | Status: DC

## 2018-02-09 MED ORDER — IPRATROPIUM-ALBUTEROL 0.5-2.5 (3) MG/3ML IN SOLN: 3.0000 mL | Freq: Four times a day (QID) | RESPIRATORY_TRACT | Status: DC | PRN

## 2018-02-09 MED ORDER — GUAIFENESIN ER 600 MG PO TB12: 600.0000 mg | ORAL_TABLET | Freq: Two times a day (BID) | ORAL | Status: DC | PRN

## 2018-02-09 MED ORDER — FLUTICASONE PROPIONATE 50 MCG/ACT NA SUSP
2.0000 | Freq: Every day | NASAL | Status: DC
  Filled 2018-02-09: qty 16

## 2018-02-09 MED ORDER — IPRATROPIUM-ALBUTEROL 0.5-2.5 (3) MG/3ML IN SOLN: 3.0000 mL | Freq: Once | RESPIRATORY_TRACT | Status: DC

## 2018-02-09 MED ORDER — METHYLPREDNISOLONE SODIUM SUCC 125 MG IJ SOLR
60.0000 mg | Freq: Two times a day (BID) | INTRAMUSCULAR | Status: DC
  Administered 2018-02-09 – 2018-02-10 (×2): 60 mg via INTRAVENOUS
  Filled 2018-02-09 (×2): qty 2

## 2018-02-09 MED ORDER — NAPHAZOLINE-PHENIRAMINE 0.025-0.3 % OP SOLN
1.0000 [drp] | Freq: Four times a day (QID) | OPHTHALMIC | Status: DC | PRN
  Filled 2018-02-09: qty 15

## 2018-02-09 MED ORDER — METHYLPREDNISOLONE SODIUM SUCC 125 MG IJ SOLR
125.0000 mg | Freq: Once | INTRAMUSCULAR | Status: AC
  Administered 2018-02-09: 125 mg via INTRAVENOUS
  Filled 2018-02-09: qty 2

## 2018-02-09 MED ORDER — CHLORHEXIDINE GLUCONATE 0.12 % MT SOLN
15.0000 mL | Freq: Two times a day (BID) | OROMUCOSAL | Status: DC
  Administered 2018-02-09 (×2): 15 mL via OROMUCOSAL
  Filled 2018-02-09 (×2): qty 15

## 2018-02-09 MED ORDER — IPRATROPIUM-ALBUTEROL 0.5-2.5 (3) MG/3ML IN SOLN
3.0000 mL | Freq: Once | RESPIRATORY_TRACT | Status: AC
  Administered 2018-02-09: 3 mL via RESPIRATORY_TRACT
  Filled 2018-02-09: qty 3

## 2018-02-09 MED ORDER — BISACODYL 10 MG RE SUPP: 10.0000 mg | Freq: Every day | RECTAL | Status: DC | PRN

## 2018-02-09 MED ORDER — LORATADINE 10 MG PO TABS
10.0000 mg | ORAL_TABLET | Freq: Every day | ORAL | Status: DC
  Administered 2018-02-09 – 2018-02-10 (×2): 10 mg via ORAL
  Filled 2018-02-09 (×2): qty 1

## 2018-02-09 MED ORDER — ONDANSETRON HCL 4 MG PO TABS: 4.0000 mg | ORAL_TABLET | Freq: Four times a day (QID) | ORAL | Status: DC | PRN

## 2018-02-09 NOTE — H&P (Signed)
History and Physical    AERO DRUMMONDS TSV:[id number] DOB: 04-Apr-1946 DOA: 02/09/2018   PCP: Biagio Borg, MD   Patient coming from:  Home    Chief Complaint: Productive cough and malaise  HPI: TAHMID Palmer is a 72 y.o. male with medical history significant for hypertension, hyperlipidemia, gout no recent flare, BPH, history of asthma, COPD, allergic rhinitis, history of sleep apnea not on CPAP, chronic pain syndrome, recent dental infection, presenting to the ED, with 3-week history of productive yellow cough, and wheezing, worse when lying down, and at night, with mild shortness of breath, especially with exertion during the day.  The patient reports generalized malaise, low-grade temperature.  He was seen at urgent care facility 3 weeks ago, and was given Augmentin, without improvement of his symptoms, he then was seen by his PCP on 01/27/2018, given a course of Omnicef, and prednisone, without feeling better.  He denies any chest pain or palpitations.  He denies any abdominal pain, he does have intermittent nausea without vomiting, he had one episode of diarrhea this morning following a course of antibiotics without recurrence.  He has decreased appetite due to the symptoms.  Patient  denies any dysuria or gross hematuria.  He denies any worsening lower extremity swelling, or calf pain.  He denies any recent long distance trips, or sick contacts.  He denies any tobacco, alcohol or recreational drug use.   ED Course:  BP (!) 138/58   Pulse (!) 57   Temp 98.7 F (37.1 C)   Resp 19   Ht 5' 9.5" (1.765 m)   Wt 128.4 kg (283 lb)   SpO2 99%   BMI 41.19 kg/m   Glucose 114, BUN 51, creatinine 2.56, White count 13.9, in the setting of recent prednisone, hemoglobin 12.5, Platelets 403, EKG shows normal sinus rhythm Chest x-ray shows left basilar atelectasis, and COPD, no focal consolidation. He received 1 dose of albuterol, but his symptoms did not improve.  His O2 sats decreased to  86%, for which he required 2 L of oxygen, and another dose of albuterol, Atrovent, and he also was given Solu-Medrol 125 mg injection. Last 2D echo in July 2018 shows EF 60 to 92%, grade 1 diastolic.  Review of Systems:  As per HPI otherwise all other systems reviewed and are negative  Past Medical History:  Diagnosis Date  . ALLERGIC RHINITIS 04/09/2007  . ASTHMA 12/04/2007  . BACK PAIN 09/16/2009  . BENIGN PROSTATIC HYPERTROPHY 04/09/2007  . CEREBROVASCULAR ACCIDENT, HX OF 07/17/2010  . COLONIC POLYPS, HX OF 12/04/2007  . CONSTIPATION 09/25/2010  . Degeneration of cervical intervertebral disc 03/28/2007  . DEPRESSION 12/04/2007  . DIVERTICULOSIS, COLON 12/04/2007  . DYSPHAGIA UNSPECIFIED 03/16/2008  . ERECTILE DYSFUNCTION 04/09/2007  . ESOPHAGEAL STRICTURE 05/26/2008  . GERD 12/04/2007  . HEMORRHOIDS, RECURRENT 02/11/2008  . HYPERLIPIDEMIA 12/04/2007  . HYPERTENSION 03/28/2007  . LEG PAIN, BILATERAL 01/25/2010  . LOW BACK PAIN 04/09/2007  . LUNG NODULE 12/04/2007  . Osteoarth NOS-Unspec 03/28/2007  . Other dysphagia 11/21/2009  . POLYARTHRALGIA 07/13/2008  . Polymyalgia rheumatica ([place]) 07/26/2008  . PVD WITH CLAUDICATION 02/10/2010  . RENAL INSUFFICIENCY 03/15/2010  . SLEEP APNEA, OBSTRUCTIVE, MODERATE 04/09/2007  . TB SKIN TEST, POSITIVE 03/28/2007    Past Surgical History:  Procedure Laterality Date  . ROTATOR CUFF REPAIR     Left  . TOTAL KNEE ARTHROPLASTY     x 2    Social History Social History   Socioeconomic History  . Marital  status: Widowed    Spouse name: Not on file  . Number of children: 6  . Years of education: Not on file  . Highest education level: Not on file  Occupational History  . Occupation: Art gallery manager  . Financial resource strain: Not very hard  . Food insecurity:    Worry: Sometimes true    Inability: Sometimes true  . Transportation needs:    Medical: No    Non-medical: No  Tobacco Use  . Smoking status: Former Research scientist (life sciences)  .  Smokeless tobacco: Never Used  Substance and Sexual Activity  . Alcohol use: No  . Drug use: No  . Sexual activity: Not Currently  Lifestyle  . Physical activity:    Days per week: 0 days    Minutes per session: 0 min  . Stress: Only a little  Relationships  . Social connections:    Talks on phone: More than three times a week    Gets together: More than three times a week    Attends religious service: More than 4 times per year    Active member of club or organization: Not on file    Attends meetings of clubs or organizations: More than 4 times per year    Relationship status: Widowed  . Intimate partner violence:    Fear of current or ex partner: Not on file    Emotionally abused: Not on file    Physically abused: Not on file    Forced sexual activity: Not on file  Other Topics Concern  . Not on file  Social History Narrative   Patient does not get regular exercise.   6 children - 1 died with suicide, 1 with homicide     Allergies  Allergen Reactions  . Ace Inhibitors Swelling    Angioedema throat  . Augmentin [Amoxicillin-Pot Clavulanate] Other (See Comments)    Marked weakness with po med x 3 doses  . Doxycycline     Some GI upset  . Sulfa Antibiotics Hives    And facial angioedema    Family History  Problem Relation Age of Onset  . Heart disease Father   . Lupus Brother   . Hypertension Brother   . Asthma Other        Prior to Admission medications   Medication Sig Start Date End Date Taking? Authorizing Provider  allopurinol (ZYLOPRIM) 100 MG tablet Take 1 tablet (100 mg total) by mouth daily. 12/06/17   Biagio Borg, MD  amLODipine (NORVASC) 5 MG tablet Take 1 tablet (5 mg total) by mouth daily. 11/19/17   Biagio Borg, MD  amoxicillin-clavulanate (AUGMENTIN) 875-125 MG tablet Take 1 tablet by mouth every 12 (twelve) hours. 01/27/18   Tasia Catchings, Amy V, PA-C  atorvastatin (LIPITOR) 40 MG tablet Take 1 tablet (40 mg total) by mouth daily. 10/09/17   Biagio Borg,  MD  cefdinir (OMNICEF) 300 MG capsule Take 1 capsule (300 mg total) by mouth 2 (two) times daily. 02/03/18   Biagio Borg, MD  cetirizine (ZYRTEC) 10 MG tablet Take 1 tablet (10 mg total) by mouth daily. 01/27/18   Tasia Catchings, Amy V, PA-C  chlorhexidine (PERIDEX) 0.12 % solution Use as directed 15 mLs in the mouth or throat 2 (two) times daily. 01/27/18   Tasia Catchings, Amy V, PA-C  colchicine 0.6 MG tablet Take 1 tablet (0.6 mg total) by mouth 2 (two) times daily. 11/05/17   Biagio Borg, MD  fluticasone [facility]enc[facility]) 50 MCG/ACT nasal  spray Place 2 sprays into both nostrils daily. 01/27/18   Tasia Catchings, Amy V, PA-C  furosemide (LASIX) 40 MG tablet take 1 tablet by mouth every morning AND 1 IN THE EVENING AS NEEDED FOR SWELLING ONLY 06/01/17   Biagio Borg, MD  gabapentin (NEURONTIN) 100 MG capsule Take 1 capsule (100 mg total) by mouth 3 (three) times daily. 10/18/17   Biagio Borg, MD  HYDROcodone-homatropine [facility]) 5-1.5 MG/5ML syrup Take 5 mLs by mouth every 6 (six) hours as needed for up to 10 days. 02/03/18 02/13/18  Biagio Borg, MD  meloxicam (MOBIC) 7.5 MG tablet Take 1 tablet (7.5 mg total) by mouth daily. 01/27/18   Tasia Catchings, Amy V, PA-C  naphazoline-pheniramine (NAPHCON-A) 0.025-0.3 % ophthalmic solution Place 1 drop into both eyes 4 (four) times daily as needed for irritation.    [provider]  nebivolol (BYSTOLIC) 10 MG tablet Take 2 tablets (20 mg total) by mouth daily. 11/20/17   Biagio Borg, MD  pantoprazole (PROTONIX) 40 MG tablet Take 1 tablet (40 mg total) by mouth daily. 08/16/17 08/16/18  Biagio Borg, MD  predniSONE (DELTASONE) 10 MG tablet 2 tabs by mouth per day for 5 days 02/03/18   Biagio Borg, MD  tamsulosin [facility]) 0.4 MG CAPS capsule 1 tab by mouth twice per day 05/16/17   Biagio Borg, MD  traMADol (ULTRAM) 50 MG tablet Take 1 tablet (50 mg total) by mouth every 8 (eight) hours as needed. 10/18/17   Biagio Borg, MD     Physical Exam:  Vitals:   02/09/18 0700 02/09/18 0730 02/09/18 0809  02/09/18 0815  BP: (!) 128/56 (!) 136/53  (!) 138/58  Pulse: (!) 52 (!) 58  (!) 57  Resp: 18 19    Temp:      SpO2: 95% (!) 89% 95% 99%  Weight:      Height:       Constitutional: NAD, calm, ill appearing  Eyes: PERRL, lids and conjunctivae normal ENMT: Mucous membranes are moist, without exudate or lesions  Neck: normal, supple, no masses, no thyromegaly Respiratory: Wheezing in the mid anterior chest, some bronchial sounds noted on the posterior R chest ,  no crackles. Mild respiratory effort due to cough Cardiovascular: Regular rate and rhythm,  murmur, rubs or gallops. Trace lower extremity edema. 2+ pedal pulses. No carotid bruits.  Abdomen: Soft, non tender, obese No hepatosplenomegaly. Bowel sounds positive.  Musculoskeletal: no clubbing / cyanosis. Moves all extremities Skin: no jaundice, No lesions.  Neurologic: Sensation intact  Strength equal in all extremities Psychiatric:   Alert and oriented x 3.Flat affect    Labs on Admission: I have personally reviewed following labs and imaging studies  CBC: Recent Labs  Lab 02/09/18 0339  WBC 13.9*  NEUTROABS 9.4*  HGB 12.5*  HCT 40.2  MCV 85.2  PLT 403*    Basic Metabolic Panel: Recent Labs  Lab 02/09/18 0339  NA 142  K 3.9  CL 109  CO2 24  GLUCOSE 114*  BUN 51*  CREATININE 2.56*  CALCIUM 8.4*    GFR: Estimated Creatinine Clearance: 35.4 mL/min (A) (by C-G formula based on SCr of 2.56 mg/dL (H)).  Liver Function Tests: Recent Labs  Lab 02/09/18 0339  AST 28  ALT 35  ALKPHOS 80  BILITOT 0.7  PROT 6.3*  ALBUMIN 2.4*   No results for input(s): LIPASE, AMYLASE in the last 168 hours. No results for input(s): AMMONIA in the last 168 hours.  Coagulation Profile: No results for input(s): INR, PROTIME in the last 168 hours.  Cardiac Enzymes: No results for input(s): CKTOTAL, CKMB, CKMBINDEX, TROPONINI in the last 168 hours.  BNP (last 3 results) No results for input(s): PROBNP in the last 8760  hours.  HbA1C: No results for input(s): HGBA1C in the last 72 hours.  CBG: No results for input(s): GLUCAP in the last 168 hours.  Lipid Profile: No results for input(s): CHOL, HDL, LDLCALC, TRIG, CHOLHDL, LDLDIRECT in the last 72 hours.  Thyroid Function Tests: No results for input(s): TSH, T4TOTAL, FREET4, T3FREE, THYROIDAB in the last 72 hours.  Anemia Panel: No results for input(s): VITAMINB12, FOLATE, FERRITIN, TIBC, IRON, RETICCTPCT in the last 72 hours.  Urine analysis:    Component Value Date/Time   COLORURINE YELLOW 08/16/2017 1155   APPEARANCEUR CLEAR 08/16/2017 1155   LABSPEC 1.010 08/16/2017 1155   PHURINE 5.5 08/16/2017 1155   GLUCOSEU NEGATIVE 08/16/2017 1155   HGBUR NEGATIVE 08/16/2017 1155   BILIRUBINUR NEGATIVE 08/16/2017 1155   KETONESUR NEGATIVE 08/16/2017 1155   PROTEINUR 100 (A) 12/05/2011 1649   UROBILINOGEN 0.2 08/16/2017 1155   NITRITE NEGATIVE 08/16/2017 1155   LEUKOCYTESUR NEGATIVE 08/16/2017 1155    Sepsis Labs: @LABRCNTIP (procalcitonin:4,lacticidven:4) )No results found for this or any previous visit (from the past 240 hour(s)).   Radiological Exams on Admission: Dg Chest 2 View  Result Date: 02/09/2018 CLINICAL DATA:  Shortness of breath EXAM: CHEST - 2 VIEW COMPARISON:  01/29/2017 FINDINGS: The heart size and mediastinal contours are within normal limits. The lungs are hyperinflated with diffuse interstitial coarsening. Left basilar atelectasis. No focal airspace consolidation. The visualized skeletal structures are unremarkable. IMPRESSION: Left basilar atelectasis and suspected COPD. Electronically Signed   By: Ulyses Jarred M.D.   On: 02/09/2018 04:53    EKG: Independently reviewed.  Assessment/Plan Principal Problem:   Acute respiratory failure with hypoxia (HCC) Active Problems:   Hyperlipidemia   SLEEP APNEA, OBSTRUCTIVE, MODERATE   Hypertension   Asthma   POLYARTHRALGIA   CKD (chronic kidney disease)   Paresthesia of both  feet   COPD (chronic obstructive pulmonary disease) (HCC)    Acute hypoxic respiratory failure likely secondary to acute COPD exacerbation, with symptoms progressive, over the last 3 weeks, and several courses of antibiotics and one course of prednisone, without significant improvement as an outpatient. History of OSA not on CPA   Chest x-ray shows left basilar atelectasis, and COPD, no focal consolidation. WBC 13.9 Bicarb 24 Afebrile   Osats were 86 in RA, now normal on 2 L o2  He received  2 doses of  albuterol,  One Atrovent, and he also was given Solu-Medrol 125 mg injection.  Admit to Medsurg Azithromycin q d  Duonebs and Albuterol  Solumedrol 60 mg bid IV, will need to be dc on prednisone if wheezing continues  Protonix and SSI due to steroids Continue home Hycodan for cough  Mucinex prn  Continue O2 CBC in am Sputum culture  Patient refuses CPAP, will not order  Acute on Chronic CKD st 3  : likely due to dehydration   diuretics and/ or NSAIDS.  Current Cr is 2.56 Lab Results  Component Value Date   CREATININE 2.56 (H) 02/09/2018   CREATININE 1.93 (H) 08/16/2017   CREATININE 1.59 (H) 01/29/2017  Hold diuretics and NSAIDS IVF  Follow BMP daily   Hypertension BP 138/58   Pulse  57   Continue home anti-hypertensive medications   Hyperlipidemia Continue home statins  Elevated Glucose,  likely steroid induced, no h/o DM  Check A1C Check CBG bid    Gout, no acute flare Continue Allopurinol  Chronic pain and neuropathy Continue  Home Neurontin and Oxycodone prn  Mobic, Ultram  on hold due to Acute on chronic KD   Benign prostatic hypertrophy, no acute issues Continue Flomax     DVT prophylaxis:  Hep sq Code Status:    Full Code Family Communication:  Discussed with patient Disposition Plan: Expect patient to be discharged to home after condition improves Consults called:    None  Admission status:  Medsurg Obs    Sharene Butters, PA-C [facility]    Amion text  [phone]   02/09/2018, 8:31 AM

## 2018-02-09 NOTE — ED Triage Notes (Signed)
Pt reports SOB "that comes and goes" for the past two weeks.  Pt reports despite PCP treatment including antibiotics he continues to feel poorly and can not get rid of this cough.

## 2018-02-09 NOTE — ED Provider Notes (Signed)
[facility] EMERGENCY DEPARTMENT Provider Note   CSN: [id number] Arrival date & time: 02/09/18  0309     History   Chief Complaint Chief Complaint  Patient presents with  . Shortness of Breath    HPI Troy Palmer is a 72 y.o. male.  Patient presents to the emergency department because he has had a cough for 3 weeks.  Patient reports that he was seen at urgent care and also his primary care doctor for this.  He has been treated with a course of antibiotics but has not improved.  He reports that his cough worsened tonight and kept him up, so he came to the ER for evaluation.  He has not been experiencing any fever.     Past Medical History:  Diagnosis Date  . ALLERGIC RHINITIS 04/09/2007  . ASTHMA 12/04/2007  . BACK PAIN 09/16/2009  . BENIGN PROSTATIC HYPERTROPHY 04/09/2007  . CEREBROVASCULAR ACCIDENT, HX OF 07/17/2010  . COLONIC POLYPS, HX OF 12/04/2007  . CONSTIPATION 09/25/2010  . Degeneration of cervical intervertebral disc 03/28/2007  . DEPRESSION 12/04/2007  . DIVERTICULOSIS, COLON 12/04/2007  . DYSPHAGIA UNSPECIFIED 03/16/2008  . ERECTILE DYSFUNCTION 04/09/2007  . ESOPHAGEAL STRICTURE 05/26/2008  . GERD 12/04/2007  . HEMORRHOIDS, RECURRENT 02/11/2008  . HYPERLIPIDEMIA 12/04/2007  . HYPERTENSION 03/28/2007  . LEG PAIN, BILATERAL 01/25/2010  . LOW BACK PAIN 04/09/2007  . LUNG NODULE 12/04/2007  . Osteoarth NOS-Unspec 03/28/2007  . Other dysphagia 11/21/2009  . POLYARTHRALGIA 07/13/2008  . Polymyalgia rheumatica ([place]) 07/26/2008  . PVD WITH CLAUDICATION 02/10/2010  . RENAL INSUFFICIENCY 03/15/2010  . SLEEP APNEA, OBSTRUCTIVE, MODERATE 04/09/2007  . TB SKIN TEST, POSITIVE 03/28/2007    Patient Active Problem List   Diagnosis Date Noted  . Acute respiratory failure with hypoxia ([place]) 02/09/2018  . COPD (chronic obstructive pulmonary disease) ([place]) 02/09/2018  . Pain, dental 02/03/2018  . Cough 02/03/2018  . Wheezing 02/03/2018  . Pain and swelling of lower leg,  right 12/31/2017  . BPH with obstruction/lower urinary tract symptoms 05/16/2017  . Bilateral ankle pain 05/16/2017  . Acute gout 02/26/2017  . Dyspnea 11/16/2016  . Complete tear of right rotator cuff 06/26/2016  . Cellulitis of deltoid region 06/26/2016  . Greater trochanteric bursitis of right hip 05/05/2014  . Right hip pain 05/04/2014  . Posterior neck pain 12/22/2013  . Orchitis, right 12/22/2013  . Paresthesia of both feet 12/22/2013  . Bilateral shoulder pain 09/16/2013  . Allergic angioedema 07/31/2012  . Impaired glucose tolerance 04/15/2012  . Chest pain 02/24/2012  . Foot pain, bilateral 02/20/2012  . Leg pain, bilateral 01/19/2012  . Edema 01/16/2012  . Dysphagia 11/21/2011  . Gout 06/07/2011  . CKD (chronic kidney disease) 06/07/2011  . Wellness examination 02/14/2011  . Encounter for long-term (current) use of other medications 11/08/2010  . CONSTIPATION 09/25/2010  . Memory loss 06/28/2010  . PVD (peripheral vascular disease) ([place]) 02/10/2010  . LEG PAIN, BILATERAL 01/25/2010  . Backache 09/16/2009  . Polymyalgia rheumatica ([place]) 07/26/2008  . POLYARTHRALGIA 07/13/2008  . ESOPHAGEAL STRICTURE 05/26/2008  . HEMORRHOIDS, RECURRENT 02/11/2008  . Hyperlipidemia 12/04/2007  . DEPRESSION 12/04/2007  . Asthma 12/04/2007  . LUNG NODULE 12/04/2007  . GERD 12/04/2007  . DIVERTICULOSIS, COLON 12/04/2007  . COLONIC POLYPS, HX OF 12/04/2007  . ERECTILE DYSFUNCTION 04/09/2007  . SLEEP APNEA, OBSTRUCTIVE, MODERATE 04/09/2007  . Allergic rhinitis 04/09/2007  . BENIGN PROSTATIC HYPERTROPHY 04/09/2007  . LOW BACK PAIN 04/09/2007  . OBESITY 03/28/2007  . Hypertension 03/28/2007  .  Osteoarth NOS-Unspec 03/28/2007  . Degeneration of cervical intervertebral disc 03/28/2007  . TB SKIN TEST, POSITIVE 03/28/2007    Past Surgical History:  Procedure Laterality Date  . ROTATOR CUFF REPAIR     Left  . TOTAL KNEE ARTHROPLASTY     x 2        Home Medications     Prior to Admission medications   Medication Sig Start Date End Date Taking? Authorizing Provider  allopurinol (ZYLOPRIM) 100 MG tablet Take 1 tablet (100 mg total) by mouth daily. 12/06/17   Biagio Borg, MD  amLODipine (NORVASC) 5 MG tablet Take 1 tablet (5 mg total) by mouth daily. 11/19/17   Biagio Borg, MD  amoxicillin-clavulanate (AUGMENTIN) 875-125 MG tablet Take 1 tablet by mouth every 12 (twelve) hours. 01/27/18   Tasia Catchings, Amy V, PA-C  atorvastatin (LIPITOR) 40 MG tablet Take 1 tablet (40 mg total) by mouth daily. 10/09/17   Biagio Borg, MD  cefdinir (OMNICEF) 300 MG capsule Take 1 capsule (300 mg total) by mouth 2 (two) times daily. 02/03/18   Biagio Borg, MD  cetirizine (ZYRTEC) 10 MG tablet Take 1 tablet (10 mg total) by mouth daily. 01/27/18   Tasia Catchings, Amy V, PA-C  chlorhexidine (PERIDEX) 0.12 % solution Use as directed 15 mLs in the mouth or throat 2 (two) times daily. 01/27/18   Tasia Catchings, Amy V, PA-C  colchicine 0.6 MG tablet Take 1 tablet (0.6 mg total) by mouth 2 (two) times daily. 11/05/17   Biagio Borg, MD  fluticasone (FLONASE) 50 MCG/ACT nasal spray Place 2 sprays into both nostrils daily. 01/27/18   Tasia Catchings, Amy V, PA-C  furosemide (LASIX) 40 MG tablet take 1 tablet by mouth every morning AND 1 IN THE EVENING AS NEEDED FOR SWELLING ONLY 06/01/17   Biagio Borg, MD  gabapentin (NEURONTIN) 100 MG capsule Take 1 capsule (100 mg total) by mouth 3 (three) times daily. 10/18/17   Biagio Borg, MD  HYDROcodone-homatropine [facility]) 5-1.5 MG/5ML syrup Take 5 mLs by mouth every 6 (six) hours as needed for up to 10 days. 02/03/18 02/13/18  Biagio Borg, MD  meloxicam (MOBIC) 7.5 MG tablet Take 1 tablet (7.5 mg total) by mouth daily. 01/27/18   Tasia Catchings, Amy V, PA-C  naphazoline-pheniramine (NAPHCON-A) 0.025-0.3 % ophthalmic solution Place 1 drop into both eyes 4 (four) times daily as needed for irritation.    [provider]  nebivolol (BYSTOLIC) 10 MG tablet Take 2 tablets (20 mg total) by mouth daily.  11/20/17   Biagio Borg, MD  pantoprazole (PROTONIX) 40 MG tablet Take 1 tablet (40 mg total) by mouth daily. 08/16/17 08/16/18  Biagio Borg, MD  predniSONE (DELTASONE) 10 MG tablet 2 tabs by mouth per day for 5 days 02/03/18   Biagio Borg, MD  tamsulosin [facility]) 0.4 MG CAPS capsule 1 tab by mouth twice per day 05/16/17   Biagio Borg, MD  traMADol (ULTRAM) 50 MG tablet Take 1 tablet (50 mg total) by mouth every 8 (eight) hours as needed. 10/18/17   Biagio Borg, MD    Family History Family History  Problem Relation Age of Onset  . Heart disease Father   . Lupus Brother   . Hypertension Brother   . Asthma Other     Social History Social History   Tobacco Use  . Smoking status: Former Research scientist (life sciences)  . Smokeless tobacco: Never Used  Substance Use Topics  . Alcohol use: No  .  Drug use: No     Allergies   Ace inhibitors; Augmentin [amoxicillin-pot clavulanate]; Doxycycline; and Sulfa antibiotics   Review of Systems Review of Systems  Respiratory: Positive for cough and shortness of breath.   All other systems reviewed and are negative.    Physical Exam Updated Vital Signs BP (!) 135/43   Pulse (!) 55   Temp 98.7 F (37.1 C)   Resp (!) 26   Ht 5' 9.5" (1.765 m)   Wt 128.4 kg (283 lb)   SpO2 (!) 89%   BMI 41.19 kg/m   Physical Exam  Constitutional: He is oriented to person, place, and time. He appears well-developed and well-nourished. No distress.  HENT:  Head: Normocephalic and atraumatic.  Right Ear: Hearing normal.  Left Ear: Hearing normal.  Nose: Nose normal.  Mouth/Throat: Oropharynx is clear and moist and mucous membranes are normal.  Eyes: Pupils are equal, round, and reactive to light. Conjunctivae and EOM are normal.  Neck: Normal range of motion. Neck supple.  Cardiovascular: Regular rhythm, S1 normal and S2 normal. Exam reveals no gallop and no friction rub.  No murmur heard. Pulmonary/Chest: Effort normal and breath sounds normal. No respiratory  distress. He exhibits no tenderness.  Abdominal: Soft. Normal appearance and bowel sounds are normal. There is no hepatosplenomegaly. There is no tenderness. There is no rebound, no guarding, no tenderness at McBurney's point and negative Murphy's sign. No hernia.  Musculoskeletal: Normal range of motion.  Neurological: He is alert and oriented to person, place, and time. He has normal strength. No cranial nerve deficit or sensory deficit. Coordination normal. GCS eye subscore is 4. GCS verbal subscore is 5. GCS motor subscore is 6.  Skin: Skin is warm, dry and intact. No rash noted. No cyanosis.  Psychiatric: He has a normal mood and affect. His speech is normal and behavior is normal. Thought content normal.  Nursing note and vitals reviewed.    ED Treatments / Results  Labs (all labs ordered are listed, but only abnormal results are displayed) Labs Reviewed  COMPREHENSIVE METABOLIC PANEL - Abnormal; Notable for the following components:      Result Value   Glucose, Bld 114 (*)    BUN 51 (*)    Creatinine, Ser 2.56 (*)    Calcium 8.4 (*)    Total Protein 6.3 (*)    Albumin 2.4 (*)    GFR calc non Af Amer 24 (*)    GFR calc Af Amer 27 (*)    All other components within normal limits  CBC WITH DIFFERENTIAL/PLATELET - Abnormal; Notable for the following components:   WBC 13.9 (*)    Hemoglobin 12.5 (*)    Platelets 403 (*)    Neutro Abs 9.4 (*)    Monocytes Absolute 1.1 (*)    Abs Immature Granulocytes 0.6 (*)    All other components within normal limits    EKG EKG Interpretation  Date/Time:  Sunday February 09 2018 03:28:24 EDT Ventricular Rate:  64 PR Interval:  170 QRS Duration: 106 QT Interval:  412 QTC Calculation: 425 R Axis:   35 Text Interpretation:  Normal sinus rhythm Early repolarization Normal ECG Confirmed by Orpah Greek [id number]) on 02/09/2018 5:41:01 AM   Radiology Dg Chest 2 View  Result Date: 02/09/2018 CLINICAL DATA:  Shortness of breath EXAM:  CHEST - 2 VIEW COMPARISON:  01/29/2017 FINDINGS: The heart size and mediastinal contours are within normal limits. The lungs are hyperinflated with diffuse interstitial coarsening. Left basilar  atelectasis. No focal airspace consolidation. The visualized skeletal structures are unremarkable. IMPRESSION: Left basilar atelectasis and suspected COPD. Electronically Signed   By: Ulyses Jarred M.D.   On: 02/09/2018 04:53    Procedures Procedures (including critical care time)  Medications Ordered in ED Medications  albuterol (PROVENTIL) (2.5 MG/3ML) 0.083% nebulizer solution 5 mg (5 mg Nebulization Refused 02/09/18 0603)  methylPREDNISolone sodium succinate (SOLU-MEDROL) 125 mg/2 mL injection 125 mg (has no administration in time range)  ipratropium-albuterol (DUONEB) 0.5-2.5 (3) MG/3ML nebulizer solution 3 mL (has no administration in time range)  ipratropium-albuterol (DUONEB) 0.5-2.5 (3) MG/3ML nebulizer solution 3 mL (has no administration in time range)     Initial Impression / Assessment and Plan / ED Course  I have reviewed the triage vital signs and the nursing notes.  Pertinent labs & imaging results that were available during my care of the patient were reviewed by me and considered in my medical decision making (see chart for details).     Patient presents to the ER for evaluation of cough.  Symptoms ongoing for 3 weeks.  He was treated with Augmentin at urgent care and then placed on Omnicef and prednisone by his primary care doctor, but reports that he has not had improvement.  Chest x-ray does not show evidence of pneumonia today.  Patient reported only slight improvement with nebulizer. He had progressive worsening of his oxygenation. He desaturated to 86% with ambulation, became more SOB. No sign of CHF. No chest pain.Normal EKG.   Patient will require admission for acute bronchitis, bronchospasm and new oxygen requirement.  Final Clinical Impressions(s) / ED Diagnoses   Final  diagnoses:  Acute respiratory failure with hypoxia [facility])    ED Discharge Orders    None       Orpah Greek, MD 02/09/18 [id number]

## 2018-02-10 DIAGNOSIS — J9601 Acute respiratory failure with hypoxia: Principal | ICD-10-CM

## 2018-02-10 DIAGNOSIS — I10 Essential (primary) hypertension: Secondary | ICD-10-CM

## 2018-02-10 DIAGNOSIS — E782 Mixed hyperlipidemia: Secondary | ICD-10-CM

## 2018-02-10 DIAGNOSIS — N183 Chronic kidney disease, stage 3 (moderate): Secondary | ICD-10-CM

## 2018-02-10 DIAGNOSIS — G4733 Obstructive sleep apnea (adult) (pediatric): Secondary | ICD-10-CM | POA: Diagnosis not present

## 2018-02-10 DIAGNOSIS — J45909 Unspecified asthma, uncomplicated: Secondary | ICD-10-CM | POA: Diagnosis present

## 2018-02-10 DIAGNOSIS — J4521 Mild intermittent asthma with (acute) exacerbation: Secondary | ICD-10-CM | POA: Diagnosis not present

## 2018-02-10 LAB — GLUCOSE, CAPILLARY: Glucose-Capillary: 206 mg/dL — ABNORMAL HIGH (ref 70–99)

## 2018-02-10 LAB — CBC
HEMATOCRIT: 39.2 % (ref 39.0–52.0)
HEMOGLOBIN: 12.2 g/dL — AB (ref 13.0–17.0)
MCH: 26.7 pg (ref 26.0–34.0)
MCHC: 31.1 g/dL (ref 30.0–36.0)
MCV: 85.8 fL (ref 78.0–100.0)
Platelets: 378 10*3/uL (ref 150–400)
RBC: 4.57 MIL/uL (ref 4.22–5.81)
RDW: 13.4 % (ref 11.5–15.5)
WBC: 25.7 10*3/uL — ABNORMAL HIGH (ref 4.0–10.5)

## 2018-02-10 LAB — BASIC METABOLIC PANEL
ANION GAP: 10 (ref 5–15)
BUN: 44 mg/dL — ABNORMAL HIGH (ref 8–23)
CALCIUM: 8.7 mg/dL — AB (ref 8.9–10.3)
CO2: 22 mmol/L (ref 22–32)
CREATININE: 2.03 mg/dL — AB (ref 0.61–1.24)
Chloride: 109 mmol/L (ref 98–111)
GFR, EST AFRICAN AMERICAN: 36 mL/min — AB (ref >60)
GFR, EST NON AFRICAN AMERICAN: 31 mL/min — AB (ref >60)
Glucose, Bld: 145 mg/dL — ABNORMAL HIGH (ref 70–99)
Potassium: 4.6 mmol/L (ref 3.5–5.1)
Sodium: 141 mmol/L (ref 135–145)

## 2018-02-10 MED ORDER — BUDESONIDE-FORMOTEROL FUMARATE 80-4.5 MCG/ACT IN AERO: 2.0000 | INHALATION_SPRAY | Freq: Two times a day (BID) | RESPIRATORY_TRACT | 12 refills | Status: DC

## 2018-02-10 MED ORDER — ALBUTEROL SULFATE HFA 108 (90 BASE) MCG/ACT IN AERS: 2.0000 | INHALATION_SPRAY | Freq: Four times a day (QID) | RESPIRATORY_TRACT | 12 refills | Status: DC | PRN

## 2018-02-10 NOTE — Care Management Note (Signed)
Case Management Note  Patient Details  Name: Troy Palmer MRN: [id number] Date of Birth: September 15, 1945  Subjective/Objective:                    Action/Plan:  Patient wanting assistance with his inhalers. Confirmed patient does have insurance prescription coverage. Provided application for assistance and discount card for his inhalers. Patient stated " I am not messing with that". Explained NCM unable to pay co pays. Patient voiced understanding.   Expected Discharge Date:  02/10/18               Expected Discharge Plan:  Home/Self Care  In-House Referral:     Discharge planning Services  CM Consult, Medication Assistance  Post Acute Care Choice:  NA Choice offered to:  Patient  DME Arranged:  N/A DME Agency:  NA  HH Arranged:  NA HH Agency:  NA  Status of Service:  Completed, signed off  If discussed at [place] of Stay Meetings, dates discussed:    Additional Comments:  Marilu Favre, RN 02/10/2018, 11:23 AM

## 2018-02-10 NOTE — Discharge Summary (Signed)
Physician Discharge Summary  Patient ID: Troy Palmer MRN: [id number] DOB/AGE: [birth year] 72 y.o.  Admit date: 02/09/2018 Discharge date: 02/10/2018  Admission Diagnoses: Principal Problem:   Acute respiratory failure with hypoxia ([place]) Active Problems:   Hyperlipidemia   SLEEP APNEA, OBSTRUCTIVE, MODERATE   Hypertension   Asthma   POLYARTHRALGIA   CKD (chronic kidney disease)   Paresthesia of both feet    Discharge Diagnoses:  Principal Problem:   Asthmatic bronchitis   Acute respiratory failure with hypoxia ([facility])   Hyperlipidemia   SLEEP APNEA, OBSTRUCTIVE, MODERATE   Hypertension   POLYARTHRALGIA   CKD (chronic kidney disease)   Paresthesia of both feet   Discharged Condition: good  Hospital Course: Troy Palmer a 72 y.o.malewith medical history significant forhypertension, hyperlipidemia, gout no recent flare,BPH, history of asthma, COPD, allergic rhinitis, history of sleep apnea not on CPAP, chronic pain syndrome, recent dental infection, presenting to the ED, with 3-week history of productive yellow cough, and wheezing, worse when lying down, and at night, with mild shortness of breath, especially with exertion during the day. The patient reports generalized malaise, low-grade temperature.He was seen at urgent care facility 3 weeks ago, and was given Augmentin, without improvement of his symptoms, he then was seen by his PCP on 01/27/2018, given a course of Omnicef, and prednisone, without feeling better. He has decreased appetite due to the symptoms. In ED CXR was negative.  Pt rec'd albuterol nebs, atrovent and solumedrol 125 iv and was admitted.          Impression/Plan:  1) Asthmatic bronchitis: improving w/ IV steroids and nebs primarily.   Walking SpO2 is 91% today and wheezing is resolved. No hx smoking doubt COPD.  No purulent sputum of fevers, will dc abx. Suspect this is asthmatic issue. Have d/w pulm MD on the phone, recommending if  improved ok to dc on >> - symbicort inhaler (laba/ steroid) - albuterol rescue MDI - 14 day course of cough medication - doubt infection, will dc abx - have scheduled pt to be seen in pulm clinic for new pt appt - also can f/u w PCP as needed  2) CKD stage 3 - creat down to baseline 2.0 today, stable  3) Hypertension - cont norvasc and bystolic  4) Hyperlipidemia - Continue home statins  5) Gout, no acute flare, continue Allopurinol  6) Chronic pain and neuropathy - Continue neurontin, and ultram prn  7) Benign prostatic hypertrophy - no acute issue, continueFlomax    DVT prophylaxis:Hep sq Code Status:Full Code Disposition Plan:dc home Consults called:None Admission status:Medsurg Obs   Discharge Exam: Blood pressure (!) 189/81, pulse (!) 55, temperature 97.7 F (36.5 C), temperature source Oral, resp. rate 18, height 5\' 10"  (1.778 m), weight 128.4 kg (283 lb), SpO2 98 %. Alert, no distress, nasal O2  no jvd Chest clear bilat , occ soft wheezing but good air movement Cor RRR no mrg abd soft obese nfnd Ext no edema NF, Ox 3    Disposition: Discharge disposition: 01-Home or Self Care        Allergies as of 02/10/2018      Reactions   Ace Inhibitors Swelling   Angioedema throat   Augmentin [amoxicillin-pot Clavulanate] Other (See Comments)   Marked weakness with po med x 3 doses   Doxycycline    Some GI upset   Sulfa Antibiotics Hives   And facial angioedema      Medication List    STOP taking these medications  cefdinir 300 MG capsule Commonly known as:  OMNICEF     TAKE these medications   albuterol 108 (90 Base) MCG/ACT inhaler Commonly known as:  PROVENTIL HFA;VENTOLIN HFA Inhale 2 puffs into the lungs every 6 (six) hours as needed for wheezing or shortness of breath.   allopurinol 100 MG tablet Commonly known as:  ZYLOPRIM Take 1 tablet (100 mg total) by mouth daily.   amLODipine 5 MG tablet Commonly known  as:  NORVASC Take 1 tablet (5 mg total) by mouth daily.   atorvastatin 40 MG tablet Commonly known as:  LIPITOR Take 1 tablet (40 mg total) by mouth daily.   budesonide-formoterol 80-4.5 MCG/ACT inhaler Commonly known as:  SYMBICORT Inhale 2 puffs into the lungs 2 (two) times daily.   cetirizine 10 MG tablet Commonly known as:  ZYRTEC Take 1 tablet (10 mg total) by mouth daily.   chlorhexidine 0.12 % solution Commonly known as:  PERIDEX Use as directed 15 mLs in the mouth or throat 2 (two) times daily.   colchicine 0.6 MG tablet Take 1 tablet (0.6 mg total) by mouth 2 (two) times daily. What changed:    when to take this  reasons to take this   fluticasone 50 MCG/ACT nasal spray Commonly known as:  FLONASE Place 2 sprays into both nostrils daily. What changed:    when to take this  reasons to take this   furosemide 40 MG tablet Commonly known as:  LASIX take 1 tablet by mouth every morning AND 1 IN THE EVENING AS NEEDED FOR SWELLING ONLY   gabapentin 100 MG capsule Commonly known as:  NEURONTIN Take 1 capsule (100 mg total) by mouth 3 (three) times daily.   HYDROcodone-homatropine 5-1.5 MG/5ML syrup Commonly known as:  HYCODAN Take 5 mLs by mouth every 6 (six) hours as needed for up to 10 days.   meloxicam 7.5 MG tablet Commonly known as:  MOBIC Take 1 tablet (7.5 mg total) by mouth daily.   naphazoline-pheniramine 0.025-0.3 % ophthalmic solution Commonly known as:  NAPHCON-A Place 1 drop into both eyes 4 (four) times daily as needed for irritation.   nebivolol 10 MG tablet Commonly known as:  BYSTOLIC Take 2 tablets (20 mg total) by mouth daily.   pantoprazole 40 MG tablet Commonly known as:  PROTONIX Take 1 tablet (40 mg total) by mouth daily.   tamsulosin 0.4 MG Caps capsule Commonly known as:  FLOMAX 1 tab by mouth twice per day What changed:    how much to take  how to take this  when to take this  additional instructions   traMADol 50  MG tablet Commonly known as:  ULTRAM Take 1 tablet (50 mg total) by mouth every 8 (eight) hours as needed.   TYLENOL 8 HOUR 650 MG CR tablet Generic drug:  acetaminophen Take 650 mg by mouth every 6 (six) hours as needed (pain/headache).        Signed: Sandy Salaam Emunah [place] 02/10/2018, 10:30 AM

## 2018-02-10 NOTE — Progress Notes (Signed)
Discharge home. Home discharge instruction given, no question verbalized. 

## 2018-02-10 NOTE — Progress Notes (Signed)
SATURATION QUALIFICATIONS: (This note is used to comply with regulatory documentation for home oxygen)  Patient Saturations on Room Air at Rest = 97%  Patient Saturations on Room Air while Ambulating = 91%  Patient Saturations on [place] of oxygen while Ambulating = n/A  Please briefly explain why patient needs home oxygen:

## 2018-02-11 ENCOUNTER — Telehealth: Payer: Self-pay | Admitting: *Deleted

## 2018-02-11 NOTE — Telephone Encounter (Signed)
Transition Care Management Follow-up Telephone Call   Date discharged? 02/10/18   How have you been since you were released from the hospital? Pt states he is doing alright   Do you understand why you were in the hospital? YES   Do you understand the discharge instructions? YES   Where were you discharged to? Home   Items Reviewed:  Medications reviewed: YES  Allergies reviewed: YES  Dietary changes reviewed: YES, heart healthy  Referrals reviewed: YES, pt states he has appt w/ pulmonologist in august per chart Aug 8th w/Dr. Elsworth Soho. Confirm w.patient   Functional Questionnaire:   Activities of Daily Living (ADLs):   He states he are independent in the following: bathing and hygiene, feeding, continence, grooming, toileting and dressing States they require assistance with the following: ambulation   Any transportation issues/concerns? NO   Any patient concerns? NO   Confirmed importance and date/time of follow-up visits scheduled YES, appt 02/14/18  Provider Appointment booked with Dr. Jenny Reichmann  Confirmed with patient if condition begins to worsen call PCP or go to the ER.  Patient was given the office number and encouraged to call back with question or concerns.  : YES

## 2018-02-12 LAB — CULTURE, RESPIRATORY: CULTURE: NORMAL

## 2018-02-14 ENCOUNTER — Ambulatory Visit (INDEPENDENT_AMBULATORY_CARE_PROVIDER_SITE_OTHER): Payer: PPO | Admitting: Internal Medicine

## 2018-02-14 ENCOUNTER — Encounter: Payer: Self-pay | Admitting: Internal Medicine

## 2018-02-14 VITALS — BP 118/66 | HR 71 | Temp 98.4°F | Ht 70.0 in | Wt 286.0 lb

## 2018-02-14 DIAGNOSIS — J9601 Acute respiratory failure with hypoxia: Secondary | ICD-10-CM | POA: Diagnosis not present

## 2018-02-14 DIAGNOSIS — R7302 Impaired glucose tolerance (oral): Secondary | ICD-10-CM | POA: Diagnosis not present

## 2018-02-14 DIAGNOSIS — J4521 Mild intermittent asthma with (acute) exacerbation: Secondary | ICD-10-CM

## 2018-02-14 NOTE — Assessment & Plan Note (Signed)
Mostly resolved, ok for increase symbicort 160/4.5, f/u pulm as planned

## 2018-02-14 NOTE — Patient Instructions (Addendum)
OK to increase the symbicort to 160  Please continue all other medications as before, and refills have been done if requested.  Please have the pharmacy call with any other refills you may need.  Please keep your appointments with your specialists as you may have planned - pulmonary  Please return in 6 months, or sooner if needed, with Lab testing done 3-5 days before

## 2018-02-14 NOTE — Assessment & Plan Note (Signed)
stable overall by history and exam, recent data reviewed with pt, and pt to continue medical treatment as before,  to f/u any worsening symptoms or concerns Lab Results  Component Value Date   HGBA1C 6.5 (H) 02/09/2018

## 2018-02-14 NOTE — Progress Notes (Signed)
Subjective:    Patient ID: Troy Palmer, male    DOB: [birth year], 72 y.o.   MRN: [id number]  HPI  Here s/p hospn 6/30 - 7/1 with asthmatic bronchitis requiring IV antibx and steroid, as well as supplemental O2 briefly.  Overall improved but still has some tightness, sob, doe with cough prod whitish only persistent.  Pt denies chest pain, orthopnea, PND, increased LE swelling, palpitations, dizziness or syncope. Pt denies new neurological symptoms such as new headache, or facial or extremity weakness or numbness   Pt denies polydipsia, polyuria,  A1c was borderline with recent hospn after recent prednisone course. Has f/u appt with pulmonary aug 6 Wt Readings from Last 3 Encounters:  02/14/18 286 lb (129.7 kg)  02/09/18 283 lb (128.4 kg)  02/03/18 283 lb (128.4 kg)   BP Readings from Last 3 Encounters:  02/14/18 118/66  02/10/18 (!) 189/81  02/03/18 118/74   Past Medical History:  Diagnosis Date  . ALLERGIC RHINITIS 04/09/2007  . ASTHMA 12/04/2007  . BACK PAIN 09/16/2009  . BENIGN PROSTATIC HYPERTROPHY 04/09/2007  . CEREBROVASCULAR ACCIDENT, HX OF 07/17/2010  . COLONIC POLYPS, HX OF 12/04/2007  . CONSTIPATION 09/25/2010  . Degeneration of cervical intervertebral disc 03/28/2007  . DEPRESSION 12/04/2007  . DIVERTICULOSIS, COLON 12/04/2007  . DYSPHAGIA UNSPECIFIED 03/16/2008  . ERECTILE DYSFUNCTION 04/09/2007  . ESOPHAGEAL STRICTURE 05/26/2008  . GERD 12/04/2007  . HEMORRHOIDS, RECURRENT 02/11/2008  . HYPERLIPIDEMIA 12/04/2007  . HYPERTENSION 03/28/2007  . LEG PAIN, BILATERAL 01/25/2010  . LOW BACK PAIN 04/09/2007  . LUNG NODULE 12/04/2007  . Osteoarth NOS-Unspec 03/28/2007  . Other dysphagia 11/21/2009  . POLYARTHRALGIA 07/13/2008  . Polymyalgia rheumatica ([place]) 07/26/2008  . PVD WITH CLAUDICATION 02/10/2010  . RENAL INSUFFICIENCY 03/15/2010  . SLEEP APNEA, OBSTRUCTIVE, MODERATE 04/09/2007  . TB SKIN TEST, POSITIVE 03/28/2007   Past Surgical History:  Procedure Laterality Date  . ROTATOR CUFF  REPAIR     Left  . TOTAL KNEE ARTHROPLASTY     x 2    reports that he has quit smoking. He has never used smokeless tobacco. He reports that he does not drink alcohol or use drugs. family history includes Asthma in his other; Heart disease in his father; Hypertension in his brother; Lupus in his brother. Allergies  Allergen Reactions  . Ace Inhibitors Swelling    Angioedema throat  . Augmentin [Amoxicillin-Pot Clavulanate] Other (See Comments)    Marked weakness with po med x 3 doses  . Doxycycline     Some GI upset  . Sulfa Antibiotics Hives    And facial angioedema   Current Outpatient Medications on File Prior to Visit  Medication Sig Dispense Refill  . acetaminophen (TYLENOL 8 HOUR) 650 MG CR tablet Take 650 mg by mouth every 6 (six) hours as needed (pain/headache).    Marland Kitchen albuterol (PROVENTIL HFA;VENTOLIN HFA) 108 (90 Base) MCG/ACT inhaler Inhale 2 puffs into the lungs every 6 (six) hours as needed for wheezing or shortness of breath. 1 Inhaler 12  . allopurinol (ZYLOPRIM) 100 MG tablet Take 1 tablet (100 mg total) by mouth daily. 90 tablet 0  . amLODipine (NORVASC) 5 MG tablet Take 1 tablet (5 mg total) by mouth daily. 30 tablet 5  . atorvastatin (LIPITOR) 40 MG tablet Take 1 tablet (40 mg total) by mouth daily. 90 tablet 1  . budesonide-formoterol (SYMBICORT) 80-4.5 MCG/ACT inhaler Inhale 2 puffs into the lungs 2 (two) times daily. 1 Inhaler 12  . cetirizine (ZYRTEC) 10 MG tablet  Take 1 tablet (10 mg total) by mouth daily. 15 tablet 0  . chlorhexidine (PERIDEX) 0.12 % solution Use as directed 15 mLs in the mouth or throat 2 (two) times daily. 120 mL 0  . colchicine 0.6 MG tablet Take 1 tablet (0.6 mg total) by mouth 2 (two) times daily. (Patient taking differently: Take 0.6 mg by mouth daily as needed (gout flare up). ) 60 tablet 5  . fluticasone (FLONASE) 50 MCG/ACT nasal spray Place 2 sprays into both nostrils daily. (Patient taking differently: Place 2 sprays into both nostrils  daily as needed for allergies. ) 1 g 0  . furosemide (LASIX) 40 MG tablet take 1 tablet by mouth every morning AND 1 IN THE EVENING AS NEEDED FOR SWELLING ONLY 60 tablet 11  . gabapentin (NEURONTIN) 100 MG capsule Take 1 capsule (100 mg total) by mouth 3 (three) times daily. 90 capsule 5  . meloxicam (MOBIC) 7.5 MG tablet Take 1 tablet (7.5 mg total) by mouth daily. 15 tablet 0  . naphazoline-pheniramine (NAPHCON-A) 0.025-0.3 % ophthalmic solution Place 1 drop into both eyes 4 (four) times daily as needed for irritation.    . nebivolol (BYSTOLIC) 10 MG tablet Take 2 tablets (20 mg total) by mouth daily. 60 tablet 5  . pantoprazole (PROTONIX) 40 MG tablet Take 1 tablet (40 mg total) by mouth daily. 90 tablet 3  . tamsulosin (FLOMAX) 0.4 MG CAPS capsule 1 tab by mouth twice per day (Patient taking differently: Take 0.4 mg by mouth daily after breakfast. ) 180 capsule 3  . traMADol (ULTRAM) 50 MG tablet Take 1 tablet (50 mg total) by mouth every 8 (eight) hours as needed. 60 tablet 1   No current facility-administered medications on file prior to visit.    Review of Systems  Constitutional: Negative for other unusual diaphoresis or sweats HENT: Negative for ear discharge or swelling Eyes: Negative for other worsening visual disturbances Respiratory: Negative for stridor or other swelling  Gastrointestinal: Negative for worsening distension or other blood Genitourinary: Negative for retention or other urinary change Musculoskeletal: Negative for other MSK pain or swelling Skin: Negative for color change or other new lesions Neurological: Negative for worsening tremors and other numbness  Psychiatric/Behavioral: Negative for worsening agitation or other fatigue All other system neg per pt    Objective:   Physical Exam BP 118/66   Pulse 71   Temp 98.4 F (36.9 C) (Oral)   Ht 5\' 10"  (1.778 m)   Wt 286 lb (129.7 kg)   SpO2 94%   BMI 41.04 kg/m  VS noted,  Constitutional: Pt appears in  NAD HENT: Head: NCAT.  Right Ear: External ear normal.  Left Ear: External ear normal.  Eyes: . Pupils are equal, round, and reactive to light. Conjunctivae and EOM are normal Nose: without d/c or deformity Neck: Neck supple. Gross normal ROM Cardiovascular: Normal rate and regular rhythm.   Pulmonary/Chest: Effort normal and breath sounds decreased without rales or wheezing.  Abd:  Soft, NT, ND, + BS, no organomegaly Neurological: Pt is alert. At baseline orientation, motor grossly intact Skin: Skin is warm. No rashes, other new lesions, no LE edema Psychiatric: Pt behavior is normal without agitation  No other exam findings  Lab Results  Component Value Date   WBC 25.7 (H) 02/10/2018   HGB 12.2 (L) 02/10/2018   HCT 39.2 02/10/2018   PLT 378 02/10/2018   GLUCOSE 145 (H) 02/10/2018   CHOL 147 08/16/2017   TRIG 69.0 08/16/2017  HDL 40.00 08/16/2017   LDLDIRECT 156.9 10/10/2010   LDLCALC 93 08/16/2017   ALT 35 02/09/2018   AST 28 02/09/2018   NA 141 02/10/2018   K 4.6 02/10/2018   CL 109 02/10/2018   CREATININE 2.03 (H) 02/10/2018   BUN 44 (H) 02/10/2018   CO2 22 02/10/2018   TSH 2.09 08/16/2017   PSA 0.98 08/16/2017   HGBA1C 6.5 (H) 02/09/2018      Assessment & Plan:

## 2018-02-14 NOTE — Assessment & Plan Note (Signed)
stable overall by history and exam, recent data reviewed with pt, and pt to continue medical treatment as before,  to f/u any worsening symptoms or concerns  

## 2018-02-19 ENCOUNTER — Encounter (HOSPITAL_COMMUNITY): Payer: Self-pay | Admitting: *Deleted

## 2018-02-19 ENCOUNTER — Other Ambulatory Visit: Payer: Self-pay

## 2018-02-19 ENCOUNTER — Emergency Department (HOSPITAL_COMMUNITY)
Admission: EM | Admit: 2018-02-19 | Discharge: 2018-02-20 | Disposition: A | Discharge status: Home / Self Care | Payer: PPO | Attending: Emergency Medicine | Admitting: Emergency Medicine

## 2018-02-19 ENCOUNTER — Emergency Department (HOSPITAL_COMMUNITY): Payer: PPO

## 2018-02-19 DIAGNOSIS — Z8673 Personal history of transient ischemic attack (TIA), and cerebral infarction without residual deficits: Secondary | ICD-10-CM | POA: Insufficient documentation

## 2018-02-19 DIAGNOSIS — J441 Chronic obstructive pulmonary disease with (acute) exacerbation: Secondary | ICD-10-CM | POA: Diagnosis not present

## 2018-02-19 DIAGNOSIS — Z96651 Presence of right artificial knee joint: Secondary | ICD-10-CM | POA: Diagnosis not present

## 2018-02-19 DIAGNOSIS — F329 Major depressive disorder, single episode, unspecified: Secondary | ICD-10-CM | POA: Diagnosis not present

## 2018-02-19 DIAGNOSIS — R05 Cough: Secondary | ICD-10-CM | POA: Diagnosis not present

## 2018-02-19 DIAGNOSIS — R0602 Shortness of breath: Secondary | ICD-10-CM | POA: Diagnosis present

## 2018-02-19 DIAGNOSIS — R9431 Abnormal electrocardiogram [ECG] [EKG]: Secondary | ICD-10-CM | POA: Diagnosis not present

## 2018-02-19 LAB — BASIC METABOLIC PANEL
ANION GAP: 10 (ref 5–15)
BUN: 21 mg/dL (ref 8–23)
CALCIUM: 8.2 mg/dL — AB (ref 8.9–10.3)
CO2: 23 mmol/L (ref 22–32)
Chloride: 109 mmol/L (ref 98–111)
Creatinine, Ser: 2.24 mg/dL — ABNORMAL HIGH (ref 0.61–1.24)
GFR calc Af Amer: 32 mL/min — ABNORMAL LOW (ref >60)
GFR, EST NON AFRICAN AMERICAN: 28 mL/min — AB (ref >60)
GLUCOSE: 119 mg/dL — AB (ref 70–99)
POTASSIUM: 3.9 mmol/L (ref 3.5–5.1)
Sodium: 142 mmol/L (ref 135–145)

## 2018-02-19 LAB — CBC
HEMATOCRIT: 35.3 % — AB (ref 39.0–52.0)
HEMOGLOBIN: 11.6 g/dL — AB (ref 13.0–17.0)
MCH: 30 pg (ref 26.0–34.0)
MCHC: 32.9 g/dL (ref 30.0–36.0)
MCV: 91.2 fL (ref 78.0–100.0)
Platelets: 224 10*3/uL (ref 150–400)
RBC: 3.87 MIL/uL — ABNORMAL LOW (ref 4.22–5.81)
RDW: 15.2 % (ref 11.5–15.5)
WBC: 8.6 10*3/uL (ref 4.0–10.5)

## 2018-02-19 LAB — TROPONIN I

## 2018-02-19 MED ORDER — ALBUTEROL SULFATE (2.5 MG/3ML) 0.083% IN NEBU
5.0000 mg | INHALATION_SOLUTION | Freq: Once | RESPIRATORY_TRACT | Status: AC
  Administered 2018-02-19: 5 mg via RESPIRATORY_TRACT
  Filled 2018-02-19: qty 6

## 2018-02-19 MED ORDER — SODIUM CHLORIDE 0.9 % IV BOLUS: 500.0000 mL | Freq: Once | INTRAVENOUS | Status: DC

## 2018-02-19 MED ORDER — METHYLPREDNISOLONE SODIUM SUCC 125 MG IJ SOLR
125.0000 mg | Freq: Once | INTRAMUSCULAR | Status: AC
  Administered 2018-02-19: 125 mg via INTRAVENOUS
  Filled 2018-02-19: qty 2

## 2018-02-19 MED ORDER — BENZONATATE 100 MG PO CAPS: 100.0000 mg | ORAL_CAPSULE | Freq: Three times a day (TID) | ORAL | 0 refills | Status: DC

## 2018-02-19 MED ORDER — PREDNISONE 20 MG PO TABS: ORAL_TABLET | ORAL | 0 refills | Status: DC

## 2018-02-19 MED ORDER — IPRATROPIUM BROMIDE 0.02 % IN SOLN
0.5000 mg | Freq: Once | RESPIRATORY_TRACT | Status: AC
  Administered 2018-02-19: 0.5 mg via RESPIRATORY_TRACT
  Filled 2018-02-19: qty 2.5

## 2018-02-19 MED ORDER — BENZONATATE 100 MG PO CAPS
100.0000 mg | ORAL_CAPSULE | Freq: Once | ORAL | Status: AC
  Administered 2018-02-19: 100 mg via ORAL
  Filled 2018-02-19: qty 1

## 2018-02-19 MED ORDER — ALBUTEROL SULFATE (2.5 MG/3ML) 0.083% IN NEBU
5.0000 mg | INHALATION_SOLUTION | Freq: Once | RESPIRATORY_TRACT | Status: DC
  Filled 2018-02-19: qty 6

## 2018-02-19 MED ORDER — MAGNESIUM SULFATE 2 GM/50ML IV SOLN
2.0000 g | Freq: Once | INTRAVENOUS | Status: AC
  Administered 2018-02-19: 2 g via INTRAVENOUS
  Filled 2018-02-19: qty 50

## 2018-02-19 NOTE — ED Provider Notes (Signed)
[facility] EMERGENCY DEPARTMENT Provider Note   CSN: [id number] Arrival date & time: 02/19/18  1742     History   Chief Complaint Chief Complaint  Patient presents with  . Cough    HPI Troy Palmer is a 72 y.o. male hx of COPD, HL, HTN, here with shortness of breath.  Patient states that he was recently admitted for COPD exacerbation.  He finished a course of steroids but is still not feeling better.  He has some cough with whitish sputum.  Denies any fevers at home.  He has been using inhalers and cough medicine with minimal relief. Patient has chronic leg swelling but not worsening. Had mild headaches but no weakness or numbness or vomiting.   The history is provided by the patient.    Past Medical History:  Diagnosis Date  . ALLERGIC RHINITIS 04/09/2007  . ASTHMA 12/04/2007  . BACK PAIN 09/16/2009  . BENIGN PROSTATIC HYPERTROPHY 04/09/2007  . CEREBROVASCULAR ACCIDENT, HX OF 07/17/2010  . COLONIC POLYPS, HX OF 12/04/2007  . CONSTIPATION 09/25/2010  . Degeneration of cervical intervertebral disc 03/28/2007  . DEPRESSION 12/04/2007  . DIVERTICULOSIS, COLON 12/04/2007  . DYSPHAGIA UNSPECIFIED 03/16/2008  . ERECTILE DYSFUNCTION 04/09/2007  . ESOPHAGEAL STRICTURE 05/26/2008  . GERD 12/04/2007  . HEMORRHOIDS, RECURRENT 02/11/2008  . HYPERLIPIDEMIA 12/04/2007  . HYPERTENSION 03/28/2007  . LEG PAIN, BILATERAL 01/25/2010  . LOW BACK PAIN 04/09/2007  . LUNG NODULE 12/04/2007  . Osteoarth NOS-Unspec 03/28/2007  . Other dysphagia 11/21/2009  . POLYARTHRALGIA 07/13/2008  . Polymyalgia rheumatica ([place]) 07/26/2008  . PVD WITH CLAUDICATION 02/10/2010  . RENAL INSUFFICIENCY 03/15/2010  . SLEEP APNEA, OBSTRUCTIVE, MODERATE 04/09/2007  . TB SKIN TEST, POSITIVE 03/28/2007    Patient Active Problem List   Diagnosis Date Noted  . Asthmatic bronchitis 02/10/2018  . Acute respiratory failure with hypoxia ([place]) 02/09/2018  . Pain, dental 02/03/2018  . Cough 02/03/2018  . Wheezing  02/03/2018  . Pain and swelling of lower leg, right 12/31/2017  . BPH with obstruction/lower urinary tract symptoms 05/16/2017  . Bilateral ankle pain 05/16/2017  . Acute gout 02/26/2017  . Dyspnea 11/16/2016  . Complete tear of right rotator cuff 06/26/2016  . Cellulitis of deltoid region 06/26/2016  . Greater trochanteric bursitis of right hip 05/05/2014  . Right hip pain 05/04/2014  . Posterior neck pain 12/22/2013  . Orchitis, right 12/22/2013  . Paresthesia of both feet 12/22/2013  . Bilateral shoulder pain 09/16/2013  . Allergic angioedema 07/31/2012  . Impaired glucose tolerance 04/15/2012  . Chest pain 02/24/2012  . Foot pain, bilateral 02/20/2012  . Leg pain, bilateral 01/19/2012  . Edema 01/16/2012  . Dysphagia 11/21/2011  . Gout 06/07/2011  . CKD (chronic kidney disease) 06/07/2011  . Wellness examination 02/14/2011  . Encounter for long-term (current) use of other medications 11/08/2010  . CONSTIPATION 09/25/2010  . Memory loss 06/28/2010  . PVD (peripheral vascular disease) ([place]) 02/10/2010  . LEG PAIN, BILATERAL 01/25/2010  . Backache 09/16/2009  . Polymyalgia rheumatica ([place]) 07/26/2008  . POLYARTHRALGIA 07/13/2008  . ESOPHAGEAL STRICTURE 05/26/2008  . HEMORRHOIDS, RECURRENT 02/11/2008  . Hyperlipidemia 12/04/2007  . DEPRESSION 12/04/2007  . Asthma 12/04/2007  . LUNG NODULE 12/04/2007  . GERD 12/04/2007  . DIVERTICULOSIS, COLON 12/04/2007  . COLONIC POLYPS, HX OF 12/04/2007  . ERECTILE DYSFUNCTION 04/09/2007  . SLEEP APNEA, OBSTRUCTIVE, MODERATE 04/09/2007  . Allergic rhinitis 04/09/2007  . BENIGN PROSTATIC HYPERTROPHY 04/09/2007  . LOW BACK PAIN 04/09/2007  . OBESITY 03/28/2007  .  Hypertension 03/28/2007  . Osteoarth NOS-Unspec 03/28/2007  . Degeneration of cervical intervertebral disc 03/28/2007  . TB SKIN TEST, POSITIVE 03/28/2007    Past Surgical History:  Procedure Laterality Date  . ROTATOR CUFF REPAIR     Left  . TOTAL KNEE ARTHROPLASTY      x 2        Home Medications    Prior to Admission medications   Medication Sig Start Date End Date Taking? Authorizing Provider  acetaminophen (TYLENOL 8 HOUR) 650 MG CR tablet Take 650 mg by mouth every 6 (six) hours as needed (pain/headache).   Yes [provider]  albuterol (PROVENTIL HFA;VENTOLIN HFA) 108 (90 Base) MCG/ACT inhaler Inhale 2 puffs into the lungs every 6 (six) hours as needed for wheezing or shortness of breath. 02/10/18  Yes Roney Jaffe, MD  allopurinol (ZYLOPRIM) 100 MG tablet Take 1 tablet (100 mg total) by mouth daily. 12/06/17  Yes Biagio Borg, MD  amLODipine (NORVASC) 5 MG tablet Take 1 tablet (5 mg total) by mouth daily. 11/19/17  Yes Biagio Borg, MD  atorvastatin (LIPITOR) 40 MG tablet Take 1 tablet (40 mg total) by mouth daily. 10/09/17  Yes Biagio Borg, MD  budesonide-formoterol [facility]) 80-4.5 MCG/ACT inhaler Inhale 2 puffs into the lungs 2 (two) times daily. 02/10/18  Yes Roney Jaffe, MD  cetirizine (ZYRTEC) 10 MG tablet Take 1 tablet (10 mg total) by mouth daily. 01/27/18  Yes Yu, Amy V, PA-C  chlorhexidine (PERIDEX) 0.12 % solution Use as directed 15 mLs in the mouth or throat 2 (two) times daily. 01/27/18  Yes Yu, Amy V, PA-C  colchicine 0.6 MG tablet Take 1 tablet (0.6 mg total) by mouth 2 (two) times daily. Patient taking differently: Take 0.6 mg by mouth daily as needed (gout flare up).  11/05/17  Yes Biagio Borg, MD  fluticasone [facility]) 50 MCG/ACT nasal spray Place 2 sprays into both nostrils daily. Patient taking differently: Place 2 sprays into both nostrils daily as needed for allergies.  01/27/18  Yes Yu, Amy V, PA-C  furosemide (LASIX) 40 MG tablet take 1 tablet by mouth every morning AND 1 IN THE EVENING AS NEEDED FOR SWELLING ONLY 06/01/17  Yes Biagio Borg, MD  gabapentin (NEURONTIN) 100 MG capsule Take 1 capsule (100 mg total) by mouth 3 (three) times daily. 10/18/17  Yes Biagio Borg, MD  meloxicam (MOBIC) 7.5 MG tablet Take  1 tablet (7.5 mg total) by mouth daily. 01/27/18  Yes Yu, Amy V, PA-C  naphazoline-pheniramine (NAPHCON-A) 0.025-0.3 % ophthalmic solution Place 1 drop into both eyes 4 (four) times daily as needed for irritation.   Yes [provider]  nebivolol (BYSTOLIC) 10 MG tablet Take 2 tablets (20 mg total) by mouth daily. 11/20/17  Yes Biagio Borg, MD  pantoprazole (PROTONIX) 40 MG tablet Take 1 tablet (40 mg total) by mouth daily. 08/16/17 08/16/18 Yes Biagio Borg, MD  tamsulosin [facility]) 0.4 MG CAPS capsule 1 tab by mouth twice per day Patient taking differently: Take 0.4 mg by mouth daily after breakfast.  05/16/17  Yes Biagio Borg, MD  traMADol (ULTRAM) 50 MG tablet Take 1 tablet (50 mg total) by mouth every 8 (eight) hours as needed. 10/18/17  Yes Biagio Borg, MD    Family History Family History  Problem Relation Age of Onset  . Heart disease Father   . Lupus Brother   . Hypertension Brother   . Asthma Other  Social History Social History   Tobacco Use  . Smoking status: Former Research scientist (life sciences)  . Smokeless tobacco: Never Used  Substance Use Topics  . Alcohol use: No  . Drug use: No     Allergies   Ace inhibitors; Augmentin [amoxicillin-pot clavulanate]; Doxycycline; and Sulfa antibiotics   Review of Systems Review of Systems  Respiratory: Positive for cough.   All other systems reviewed and are negative.    Physical Exam Updated Vital Signs BP (!) 145/50   Pulse 90   Temp 99.1 F (37.3 C) (Oral)   Resp 19   Ht 5\' 9"  (1.753 m)   Wt 127 kg (280 lb)   SpO2 98%   BMI 41.35 kg/m   Physical Exam  Constitutional: He is oriented to person, place, and time. He appears well-developed.  Tachypneic, slightly uncomfortable   HENT:  Head: Normocephalic.  Mouth/Throat: Oropharynx is clear and moist.  Eyes: Pupils are equal, round, and reactive to light. Conjunctivae and EOM are normal.  Neck: Normal range of motion. Neck supple.  Cardiovascular: Normal rate, regular  rhythm and normal heart sounds.  Pulmonary/Chest:  Slightly tachypneic, mild diffuse wheezing, no retractions   Abdominal: Soft. Bowel sounds are normal. He exhibits no distension. There is no tenderness. There is no guarding.  Musculoskeletal: Normal range of motion. He exhibits no edema or deformity.  Neurological: He is alert and oriented to person, place, and time. No cranial nerve deficit. Coordination normal.  Skin: Skin is warm.  Psychiatric: He has a normal mood and affect.  Nursing note and vitals reviewed.    ED Treatments / Results  Labs (all labs ordered are listed, but only abnormal results are displayed) Labs Reviewed  BASIC METABOLIC PANEL - Abnormal; Notable for the following components:      Result Value   Glucose, Bld 119 (*)    Creatinine, Ser 2.24 (*)    Calcium 8.2 (*)    GFR calc non Af Amer 28 (*)    GFR calc Af Amer 32 (*)    All other components within normal limits  CBC - Abnormal; Notable for the following components:   RBC 3.87 (*)    Hemoglobin 11.6 (*)    HCT 35.3 (*)    All other components within normal limits  TROPONIN I    EKG EKG Interpretation  Date/Time:  Wednesday February 19 2018 17:45:22 EDT Ventricular Rate:  81 PR Interval:  176 QRS Duration: 64 QT Interval:  348 QTC Calculation: 404 R Axis:   54 Text Interpretation:  Normal sinus rhythm Possible Anterior infarct , age undetermined Abnormal ECG No significant change since last tracing Confirmed by Wandra Arthurs ([id number]) on 02/19/2018 8:45:43 PM   Radiology Dg Chest 2 View  Result Date: 02/19/2018 CLINICAL DATA:  72 y/o  M; reactive cough and headache for 1 month. EXAM: CHEST - 2 VIEW COMPARISON:  02/09/2018 chest radiograph FINDINGS: Stable normal cardiac silhouette. Mild bronchitic changes in the lung bases. No consolidation. No pleural effusion or pneumothorax. Bones are unremarkable. IMPRESSION: Mild bronchitic changes in the lung bases.  No consolidation. Electronically Signed    By: Kristine Garbe M.D.   On: 02/19/2018 19:01    Procedures Procedures (including critical care time)  Medications Ordered in ED Medications  albuterol (PROVENTIL) (2.5 MG/3ML) 0.083% nebulizer solution 5 mg (5 mg Nebulization Not Given 02/19/18 2305)  methylPREDNISolone sodium succinate (SOLU-MEDROL) 125 mg/2 mL injection 125 mg (125 mg Intravenous Given 02/19/18 2141)  albuterol (PROVENTIL) (2.5 MG/3ML)  0.083% nebulizer solution 5 mg (5 mg Nebulization Given 02/19/18 2144)  ipratropium (ATROVENT) nebulizer solution 0.5 mg (0.5 mg Nebulization Given 02/19/18 2144)  magnesium sulfate IVPB 2 g 50 mL (0 g Intravenous Stopped 02/19/18 2306)  benzonatate (TESSALON) capsule 100 mg (100 mg Oral Given 02/19/18 2306)     Initial Impression / Assessment and Plan / ED Course  I have reviewed the triage vital signs and the nursing notes.  Pertinent labs & imaging results that were available during my care of the patient were reviewed by me and considered in my medical decision making (see chart for details).     SAMARTH OGLE is a 72 y.o. male here with cough, wheezing. Likely COPD exacerbation. Consider pneumonia as well. Will get labs, CXR. Will give nebs, steroids.   11:49 PM Labs stable. Cr. 2.2 at baseline. CXR showed no pneumonia. Given nebs, steroids, magnesium. Felt better. Minimal wheezing. Not hypoxic. Will dc home with course of steroids, tessalon pearls. He has pulmonology appointment next month.    Final Clinical Impressions(s) / ED Diagnoses   Final diagnoses:  None    ED Discharge Orders    None       Drenda Freeze, MD 02/19/18 2351

## 2018-02-19 NOTE — Discharge Instructions (Addendum)
Take prednisone taper as prescribed.   Continue your albuterol as needed.   Use tessalon pearls as needed for cough   See your doctor in a week. Talk to your doctor about getting you pulmonary follow up sooner.   Return to ER if you have worse shortness of breath, wheezing, cough, fever

## 2018-02-19 NOTE — ED Triage Notes (Signed)
The pt is c/o a cough for one month since he was last in the hospital  No temp  He is also c/o a headache

## 2018-02-19 NOTE — ED Notes (Signed)
Results reviewed.  No changes in acuity

## 2018-02-19 NOTE — ED Notes (Signed)
ED Provider at bedside. 

## 2018-02-20 ENCOUNTER — Telehealth: Payer: Self-pay | Admitting: Internal Medicine

## 2018-02-20 NOTE — Telephone Encounter (Signed)
Called [facility] and was able to get pt an appt next week and pt is aware  Copied from [place] [phone]. Topic: Referral - Request >> Feb 20, 2018  7:50 AM Synthia Innocent wrote: Reason for CRM: patient is scheduled to see Dr Elsworth Soho on 03/18/18, patient is requesting referral to a different pulmonary dr that can get him in sooner or to see if Dr Jenny Reichmann can get him in sooner with Dr Elsworth Soho for asthma and bronchitis. Please advise

## 2018-02-20 NOTE — Telephone Encounter (Signed)
Very sorry, I have no real influence over the referral process, unable to assist at this time

## 2018-02-24 ENCOUNTER — Other Ambulatory Visit: Payer: Self-pay
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
## 2018-02-25 NOTE — Telephone Encounter (Signed)
Pt informed of below. He is already scheduled for 02/27/18 with Dr. Melvyn Novas.

## 2018-02-27 ENCOUNTER — Encounter: Payer: Self-pay | Admitting: Internal Medicine

## 2018-02-27 ENCOUNTER — Other Ambulatory Visit: Payer: Self-pay

## 2018-02-27 ENCOUNTER — Ambulatory Visit (INDEPENDENT_AMBULATORY_CARE_PROVIDER_SITE_OTHER): Payer: PPO | Admitting: Internal Medicine

## 2018-02-27 VITALS — BP 130/74 | HR 72 | Ht 69.0 in | Wt 290.0 lb

## 2018-02-27 DIAGNOSIS — J45991 Cough variant asthma: Secondary | ICD-10-CM | POA: Diagnosis not present

## 2018-02-27 LAB — NITRIC OXIDE: NITRIC OXIDE: 19

## 2018-02-27 MED ORDER — MOMETASONE FURO-FORMOTEROL FUM 100-5 MCG/ACT IN AERO: 2.0000 | INHALATION_SPRAY | Freq: Two times a day (BID) | RESPIRATORY_TRACT | 0 refills | Status: DC

## 2018-02-27 MED ORDER — MOMETASONE FURO-FORMOTEROL FUM 100-5 MCG/ACT IN AERO: 2.0000 | INHALATION_SPRAY | Freq: Two times a day (BID) | RESPIRATORY_TRACT | 11 refills | Status: DC

## 2018-02-27 NOTE — Patient Outreach (Signed)
[facility]) Care Management  02/27/2018  Troy Palmer 19-Apr-1946 [id number]  72 year old male referred to [place] Management.  [place] services requested for medication assistance for Symbicort, Proventil and Bystolic along with 30 day post discharge medication review.   PMHx includes, but not limited to, hypertension, peripheral vascular disease, asthma, GERD esophageal stricture, diverticulitis, impaired glucose tolerance, benign prostatic hypertrophy and gout.  Successful outreach attempt to Mr. Kwong.  HIPAA identifiers verified.   Patient reports that he is at the [place] office and request that I call him tomorrow.    Plan: Outreach attempt #2 tomorrow.   [name], PharmD Clinical Pharmacist [place] [phone]

## 2018-02-27 NOTE — Progress Notes (Addendum)
Troy Palmer, male    DOB: [birth year],     MRN: [id number]    Brief patient profile:  19  yobm never smoker, morbidly obese with h/o ACEi intol   but healthy child /adolscent but in his 39s  Year round itchy, sneezy runny nose eval at [facility] dx allergy took shots for a year early 2000's and helped a little plus meds around 50% improvement in upper airway symtoms    then around 09/2017 acutely worse nasal symptoms for the first time reported assoc with coughing wheezing sob and admitted twice with no improvement in cough plus also nausea and abd pain no @ time of discharge "but inhalers helped some " but still hoarse and dry cough so referred to pulmonary clinic 02/27/2018 by Dr   Jonnie Finner s/p last admit:   Admit date: 02/09/2018 Discharge date: 02/10/2018   Discharge Diagnoses:  Asthmatic bronchitis   Acute respiratory failure with hypoxia (HCC)   Hyperlipidemia   SLEEP APNEA, OBSTRUCTIVE, MODERATE   Hypertension   POLYARTHRALGIA   CKD (chronic kidney disease)   Paresthesia of both feet   Discharged Condition: good  Hospital Course: Troy Palmer a 72 y.o.malewith medical history significant forhypertension, hyperlipidemia, gout no recent flare,BPH, history of asthma, COPD, allergic rhinitis, history of sleep apnea not on CPAP, chronic pain syndrome, recent dental infection, presenting to the ED, with 3-week history of productive yellow cough, and wheezing, worse when lying down, and at night, with mild shortness of breath, especially with exertion during the day. The patient reports generalized malaise, low-grade temperature.He was seen at urgent care facility 3 weeks ago, and was given Augmentin, without improvement of his symptoms, he then was seen by his PCP on 01/27/2018, given a course of Omnicef, and prednisone, without feeling better. He has decreased appetite due to the symptoms.In ED CXR was negative. Pt rec'd albuterol nebs, atrovent and solumedrol  125 iv and was admitted.       Impression/Plan:  1) Asthmatic bronchitis: improving w/ IV steroids and nebs primarily.   Walking SpO2 is 91% today and wheezing is resolved. No hx smoking doubt COPD.  No purulent sputum of fevers, will dc abx. Suspect this is asthmatic issue. Have d/w pulm MD on the phone, recommending if improved ok to dc on >> - symbicort inhaler (laba/ steroid) - albuterol rescue MDI - 14 day course of cough medication - doubt infection, will dc abx - have scheduled pt to be seen in pulm clinic for new pt appt - also can f/u w PCP as needed  2) CKD stage 3 - creat down to baseline 2.0 today, stable  3) Hypertension - cont norvasc and bystolic  4) Hyperlipidemia -Continue home statins  5)Gout, no acute flare, continue Allopurinol  6)Chronic pain and neuropathy-Continueneurontin, and ultram prn  7)Benign prostatic hypertrophy-no acute issue, continueFlomax               02/27/2018  Initial pulmonary eval/ M[facility]i [facility] Chief Complaint  Patient presents with  . Pulmonary Consult    Referred by Dr. Jonnie Finner for eval of asthma and bronchitis.  Pt c/o cough off and on "for a while"- occ prod with white sputum.  He is using his albuterol inhaler 2 x daily on average.   Dyspnea:  MMRC1 = can walk nl pace, flat grade, can't hurry or go uphills or steps s sob   Cough: immediately when lie down / was worse/ now better but still disturbs sleep > min white mucus  SABA use: seems to help some but not symb 80 2bid (not very poor hfa, see a/p)  Zyrtec helps the pnds "some" / not sure about flonase   Extremely evasive historian, initially reported "nothing helped symptoms and everything made symptoms worse" including all rx in 2 admits until I reviewed the record above with him.   No obvious patterns in day to day or daytime variability or assoc   purulent sputum or mucus plugs or hemoptysis or cp or chest tightness, subjective wheeze or overt   hb symptoms.    . Also denies any obvious fluctuation of symptoms with weather or environmental changes or other aggravating or alleviating factors except as outlined above   No unusual exposure hx or h/o childhood pna/ asthma or knowledge of premature birth.  Current Allergies, Complete Past Medical History, Past Surgical History, Family History, and Social History were reviewed in [employer] record.  ROS  The following are not active complaints unless bolded Hoarseness, sore throat, dysphagia, dental problems, itching, sneezing,  nasal congestion and sense of post discharge of excess mucus or purulent secretions, ear ache,   fever, chills, sweats, unintended wt loss or wt gain, classically pleuritic or exertional cp,  orthopnea pnd or arm/hand swelling  or leg swelling, presyncope, palpitations, abdominal pain, anorexia, nausea, vomiting, diarrhea  or change in bowel habits or change in bladder habits, change in stools or change in urine, dysuria, hematuria,  rash, arthralgias, visual complaints, headache, numbness, weakness or ataxia or problems with walking or coordination,  change in mood or  memory.               Past Medical History:  Diagnosis Date  . ALLERGIC RHINITIS 04/09/2007  . ASTHMA 12/04/2007  . BACK PAIN 09/16/2009  . BENIGN PROSTATIC HYPERTROPHY 04/09/2007  . CEREBROVASCULAR ACCIDENT, HX OF 07/17/2010  . COLONIC POLYPS, HX OF 12/04/2007  . CONSTIPATION 09/25/2010  . Degeneration of cervical intervertebral disc 03/28/2007  . DEPRESSION 12/04/2007  . DIVERTICULOSIS, COLON 12/04/2007  . DYSPHAGIA UNSPECIFIED 03/16/2008  . ERECTILE DYSFUNCTION 04/09/2007  . ESOPHAGEAL STRICTURE 05/26/2008  . GERD 12/04/2007  . HEMORRHOIDS, RECURRENT 02/11/2008  . HYPERLIPIDEMIA 12/04/2007  . HYPERTENSION 03/28/2007  . LEG PAIN, BILATERAL 01/25/2010  . LOW BACK PAIN 04/09/2007  . LUNG NODULE 12/04/2007  . Osteoarth NOS-Unspec 03/28/2007  . Other dysphagia 11/21/2009  .  POLYARTHRALGIA 07/13/2008  . Polymyalgia rheumatica ([place]) 07/26/2008  . PVD WITH CLAUDICATION 02/10/2010  . RENAL INSUFFICIENCY 03/15/2010  . SLEEP APNEA, OBSTRUCTIVE, MODERATE 04/09/2007  . TB SKIN TEST, POSITIVE 03/28/2007    Outpatient Medications Prior to Visit - unable to confirm this list   Medication Sig Dispense Refill  . acetaminophen (TYLENOL 8 HOUR) 650 MG CR tablet Take 650 mg by mouth every 6 (six) hours as needed (pain/headache).    Marland Kitchen albuterol (PROVENTIL HFA;VENTOLIN HFA) 108 (90 Base) MCG/ACT inhaler Inhale 2 puffs into the lungs every 6 (six) hours as needed for wheezing or shortness of breath. 1 Inhaler 12  . allopurinol (ZYLOPRIM) 100 MG tablet Take 1 tablet (100 mg total) by mouth daily. 90 tablet 0  . amLODipine (NORVASC) 5 MG tablet Take 1 tablet (5 mg total) by mouth daily. 30 tablet 5  . atorvastatin (LIPITOR) 40 MG tablet Take 1 tablet (40 mg total) by mouth daily. 90 tablet 1  . budesonide-formoterol (SYMBICORT) 80-4.5 MCG/ACT inhaler Inhale 2 puffs into the lungs 2 (two) times daily. 1 Inhaler 12  . cetirizine (ZYRTEC) 10 MG tablet  Take 1 tablet (10 mg total) by mouth daily. 15 tablet 0  . colchicine 0.6 MG tablet Take 1 tablet (0.6 mg total) by mouth 2 (two) times daily. (Patient taking differently: Take 0.6 mg by mouth daily as needed (gout flare up). ) 60 tablet 5  . fluticasone (FLONASE) 50 MCG/ACT nasal spray Place 2 sprays into both nostrils daily. (Patient taking differently: Place 2 sprays into both nostrils daily as needed for allergies. ) 1 g 0  . furosemide (LASIX) 40 MG tablet take 1 tablet by mouth every morning AND 1 IN THE EVENING AS NEEDED FOR SWELLING ONLY 60 tablet 11  . gabapentin (NEURONTIN) 100 MG capsule Take 1 capsule (100 mg total) by mouth 3 (three) times daily. (Patient not taking: Reported on 02/27/2018) 90 capsule 5  . meloxicam (MOBIC) 7.5 MG tablet Take 1 tablet (7.5 mg total) by mouth daily. 15 tablet 0  . naphazoline-pheniramine (NAPHCON-A)  0.025-0.3 % ophthalmic solution Place 1 drop into both eyes 4 (four) times daily as needed for irritation.    . nebivolol (BYSTOLIC) 10 MG tablet Take 2 tablets (20 mg total) by mouth daily. 60 tablet 5  . pantoprazole (PROTONIX) 40 MG tablet Take 1 tablet (40 mg total) by mouth daily. 90 tablet 3  . predniSONE (DELTASONE) 20 MG tablet Take 60 mg daily x 3 days then 40 mg daily x 3 days then 20 mg daily x 3 days 18 tablet 0  . tamsulosin (FLOMAX) 0.4 MG CAPS capsule 1 tab by mouth twice per day (Patient taking differently: Take 0.4 mg by mouth daily after breakfast. ) 180 capsule 3  . traMADol (ULTRAM) 50 MG tablet Take 1 tablet (50 mg total) by mouth every 8 (eight) hours as needed. 60 tablet 1  . benzonatate (TESSALON) 100 MG capsule Take 1 capsule (100 mg total) by mouth every 8 (eight) hours. 21 capsule 0  . chlorhexidine (PERIDEX) 0.12 % solution Use as directed 15 mLs in the mouth or throat 2 (two) times daily. 120 mL 0   No facility-administered medications prior to visit.              Objective:     BP 130/74 (BP Location: Left Arm, Cuff Size: Normal)   Pulse 72   Ht 5\' 9"  (1.753 m)   Wt 290 lb (131.5 kg)   SpO2 96%   BMI 42.83 kg/m   SpO2: 96 % RA  Wt Readings from Last 3 Encounters:  02/27/18 290 lb (131.5 kg)  02/19/18 280 lb (127 kg)  02/14/18 286 lb (129.7 kg)        Obese somber bm mod hoarse nad     HEENT: nl dentition, and oropharynx. Nl external ear canals without cough reflex - moderate bilateral non-specific turbinate edema  / Modified Mallampati Score =  2/ 3   NECK :  without JVD/Nodes/TM/ nl carotid upstrokes bilaterally   LUNGS: no acc muscle use,  Nl contour chest which is clear to A and P bilaterally without cough on insp or exp maneuvers   CV:  RRR  no s3 or murmur or increase in P2, and 1+ sym LE pitting edema  ABD:  soft and nontender with nl inspiratory excursion in the supine position. No bruits or organomegaly appreciated, bowel  sounds nl  MS:  Nl gait/ ext warm without deformities, calf tenderness, cyanosis or clubbing No obvious joint restrictions   SKIN: warm and dry without lesions    NEURO:  alert, approp, nl  sensorium with  no motor or cerebellar deficits apparent.      I personally reviewed images and agree with radiology impression as follows:  CXR:   02/19/18  Mild bronchitic changes in the lung bases.  No consolidation.   Lab Results  Component Value Date   WBC 8.6 02/19/2018   HGB 11.6 (L) 02/19/2018   HCT 35.3 (L) 02/19/2018   MCV 91.2 02/19/2018   PLT 224 02/19/2018       EOS                                                               0.2                                     02/27/2018      Assessment   No problem-specific Assessment & Plan notes found for this encounter.     Christinia Gully, MD 02/27/2018

## 2018-02-27 NOTE — Patient Instructions (Addendum)
Plan A = Automatic = dulera 100 Take 2 puffs first thing in am and then another 2 puffs about 12 hours later.   Work on inhaler technique:  relax and gently blow all the way out then take a nice smooth deep breath back in, triggering the inhaler at same time you start breathing in.  Hold for up to 5 seconds if you can. Blow out thru nose. Rinse and gargle with water when done      Plan B = Backup Only use your albuterol as a rescue medication to be used if you can't catch your breath by resting or doing a relaxed purse lip breathing pattern.  - The less you use it, the better it will work when you need it. - Ok to use the inhaler up to 2 puffs  every 4 hours if you must but call for appointment if use goes up over your usual need - Don't leave home without it !!  (think of it like the spare tire for your car)     Change pantoprazole to 40 mg Take 30- 60 min before your first and last meals of the day    GERD (REFLUX)  is an extremely common cause of respiratory symptoms just like yours , many times with no obvious heartburn at all.    It can be treated with medication, but also with lifestyle changes including elevation of the head of your bed (ideally with 6 inch  bed blocks),  Smoking cessation, avoidance of late meals, excessive alcohol, and avoid fatty foods, chocolate, peppermint, colas, red wine, and acidic juices such as orange juice.  NO MINT OR MENTHOL PRODUCTS SO NO COUGH DROPS   USE SUGARLESS CANDY INSTEAD (Jolley ranchers or Stover's or Life Savers) or even ice chips will also do - the key is to swallow to prevent all throat clearing. NO OIL BASED VITAMINS - use powdered substitutes.     Please schedule a follow up office visit in 2 weeks, sooner if needed  with all medications /inhalers   Add Next steps:  Allergy profile/ sinus ct if not convinced better

## 2018-02-28 ENCOUNTER — Encounter: Payer: Self-pay | Admitting: Internal Medicine

## 2018-02-28 ENCOUNTER — Other Ambulatory Visit: Payer: PPO

## 2018-02-28 NOTE — Patient Outreach (Signed)
72 year old male referred to [facility].  [place] services requested for medication assistance for Symbicort, Proventil and Bystolic along with 30 day post discharge medication review.   PMHx includes, but not limited to, hypertension, peripheral vascular disease, asthma, GERD esophageal stricture, diverticulitis, impaired glucose tolerance, benign prostatic hypertrophy and gout.  Successful outreach attempt to Troy Palmer.  HIPAA identifiers verified.   Patient reports that he is driving and asked that I call him back Monday afternoon.  Plan: Outreach call Monday afternoon.  [name], PharmD Clinical Pharmacist [place] [phone]

## 2018-02-28 NOTE — Assessment & Plan Note (Signed)
Body mass index is 42.83 kg/m.  -  trending up Lab Results  Component Value Date   TSH 2.09 08/16/2017     Contributing to gerd risk/ doe/reviewed the need and the process to achieve and maintain neg calorie balance > defer f/u primary care including intermittently monitoring thyroid status

## 2018-02-28 NOTE — Assessment & Plan Note (Addendum)
FENO 02/27/2018  =    19  - 02/27/2018  After extensive coaching inhaler device  effectiveness =    75% from a baseline on < 25% : try dulera 100 2bid and max rx for gerd    Overall pattern suggest either new onset cough variant asthma or Upper airway cough syndrome (previously labeled PNDS),  is so named because it's frequently impossible to sort out how much is  CR/sinusitis with freq throat clearing (which can be related to primary GERD)   vs  causing  secondary (" extra esophageal")  GERD from wide swings in gastric pressure that occur with throat clearing, often  promoting self use of mint and menthol lozenges that reduce the lower esophageal sphincter tone and exacerbate the problem further in a cyclical fashion.   These are the same pts (now being labeled as having "irritable larynx syndrome" by some cough centers) who not infrequently have a history of having failed to tolerate ace inhibitors(as was the case here) ,  dry powder inhalers or biphosphonates or report having atypical/extraesophageal reflux symptoms that don't respond to standard doses of PPI  and are easily confused as having aecopd or asthma flares by even experienced allergists/ pulmonologists (myself included).     rec continue low dose ics in form of dulera 100 2bid sample x 2 weeks and max rx for gerd and return in 2 weeks for recheck with all meds in hand using a trust but verify approach to confirm accurate Medication  Reconciliation The principal here is that until we are certain that the  patients are doing what we've asked, it makes no sense to ask them to do more.   Next steps:  Allergy profile/ sinus ct if not convinced better   Discussed with pt: Unlike when you get a prescription for eyeglasses, it's not possible to always walk out of this or any medical office with a perfect prescription that is immediately effective  based on any test that we offer here.    On the contrary, it may take several weeks for the full  impact of changes recommened today - hopefully you will respond well.  If not, then we'll adjust your medication on your next visit accordingly, knowing more then than we can possibly know now.       Total time devoted to counseling  > 50 % of initial 60 min office visit:  review case with pt/ discussion of options/alternatives/ personally creating written customized instructions  in presence of pt  then going over those specific  Instructions directly with the pt including how to use all of the meds but in particular covering each new medication in detail and the difference between the maintenance= "automatic" meds and the prns using an action plan format for the latter (If this problem/symptom => do that organization reading Left to right).  Please see AVS from this visit for a full list of these instructions which I personally wrote for this pt and  are unique to this visit.   See device teaching which extended face to face time for this visit

## 2018-03-03 ENCOUNTER — Other Ambulatory Visit: Payer: Self-pay

## 2018-03-03 NOTE — Patient Outreach (Signed)
[facility]) Care Management  03/03/2018  ANTERO Palmer Sep 30, 1945 [id number]  4year oldmalereferred to [facility] [facility].[place] services requested for medication assistance for Symbicort, Proventil and Bystolic along with 30 day post discharge medication review.PMHx includes, but not limited to, hypertension, peripheral vascular disease, asthma, GERD esophageal stricture, diverticulitis, impaired glucose tolerance, benign prostatic hypertrophy and gout.  Successful outreach attempt to Troy Palmer.  HIPAA identifiers.  Subjective: Troy Palmer states that he has tried to get help affording his medications in the past.  He reports that he is having trouble affording his Symbicort, Proventil and Bystolic prescriptions.    Medications Reviewed Today    Reviewed by Dionne Milo, [facility] (Pharmacist) on 03/03/18 at 1501  Med List Status: <None>  Medication Order Taking? Sig Documenting Provider Last Dose Status Informant  acetaminophen (TYLENOL 8 HOUR) 650 MG CR tablet [id number] Yes Take 650 mg by mouth every 6 (six) hours as needed (pain/headache). [provider] Taking Active Self  albuterol (PROVENTIL HFA;VENTOLIN HFA) 108 (90 Base) MCG/ACT inhaler [id number] Yes Inhale 2 puffs into the lungs every 6 (six) hours as needed for wheezing or shortness of breath. Roney Jaffe, MD Taking Active Self  allopurinol (ZYLOPRIM) 100 MG tablet [id number] Yes Take 1 tablet (100 mg total) by mouth daily. Biagio Borg, MD Taking Active Self  amLODipine (NORVASC) 5 MG tablet [id number] Yes Take 1 tablet (5 mg total) by mouth daily. Biagio Borg, MD Taking Active Self  atorvastatin (LIPITOR) 40 MG tablet [id number] Yes Take 1 tablet (40 mg total) by mouth daily. Biagio Borg, MD Taking Active Self  budesonide-formoterol [facility]) 80-4.5 MCG/ACT inhaler [id number] Yes Inhale 2 puffs into the lungs daily.  [provider] Taking Active   colchicine 0.6  MG tablet [id number] Yes Take 1 tablet (0.6 mg total) by mouth 2 (two) times daily. Biagio Borg, MD Taking Active Self  furosemide (LASIX) 40 MG tablet [id number] Yes take 1 tablet by mouth every morning AND 1 IN THE EVENING AS NEEDED FOR SWELLING ONLY Biagio Borg, MD Taking Active Self  gabapentin (NEURONTIN) 100 MG capsule [id number] No Take 1 capsule (100 mg total) by mouth 3 (three) times daily.  Patient not taking:  Reported on 02/27/2018   Biagio Borg, MD Not Taking Active Self  mometasone-formoterol [facility]) 100-5 MCG/ACT Hollie Salk [id number] No Inhale 2 puffs into the lungs 2 (two) times daily.  Patient not taking:  Reported on 03/03/2018   Tanda Rockers, MD Not Taking Active   nebivolol (BYSTOLIC) 10 MG tablet [id number] Yes Take 2 tablets (20 mg total) by mouth daily. Biagio Borg, MD Taking Active Self  pantoprazole (PROTONIX) 40 MG tablet [id number] Yes Take 1 tablet (40 mg total) by mouth daily. Biagio Borg, MD Taking Active Self  tamsulosin [facility]) 0.4 MG CAPS capsule [id number] Yes 1 tab by mouth twice per day  Patient taking differently:  Take 0.4 mg by mouth daily after breakfast. Pt says he takes 2 tablets at in the morning.   Biagio Borg, MD Taking Active Self         Medication Assistance: Troy Palmer currently receives partial Extra Help LIS.  Per [employer], Troy Palmer has spent $408.88 out of pocket (OOP) on prescriptions, which is very close to 3% of his income.  He may be eligible for Symbicort patient assistance through [employer], Proventil from [place] from [employer].    Plan: Route note to CPhT to begin  patient assistance for  Symbicort patient assistance through [employer], Proventil from [place] from [employer].  [name], PharmD Clinical Pharmacist [place] [phone]

## 2018-03-04 ENCOUNTER — Other Ambulatory Visit: Payer: Self-pay | Admitting: Pharmacy Technician

## 2018-03-04 ENCOUNTER — Ambulatory Visit (INDEPENDENT_AMBULATORY_CARE_PROVIDER_SITE_OTHER): Payer: PPO | Admitting: Family

## 2018-03-04 ENCOUNTER — Encounter: Payer: Self-pay | Admitting: Family

## 2018-03-04 VITALS — BP 144/82 | HR 65 | Temp 97.7°F | Ht 69.0 in | Wt 292.0 lb

## 2018-03-04 DIAGNOSIS — R062 Wheezing: Secondary | ICD-10-CM

## 2018-03-04 DIAGNOSIS — J4521 Mild intermittent asthma with (acute) exacerbation: Secondary | ICD-10-CM | POA: Diagnosis not present

## 2018-03-04 MED ORDER — PREDNISONE 20 MG PO TABS: 40.0000 mg | ORAL_TABLET | Freq: Every day | ORAL | 0 refills | Status: DC

## 2018-03-04 MED ORDER — BENZONATATE 100 MG PO CAPS
100.0000 mg | ORAL_CAPSULE | Freq: Three times a day (TID) | ORAL | 0 refills | Status: DC | PRN
Start: 2018-03-04 — End: 2018-03-25

## 2018-03-04 NOTE — Progress Notes (Signed)
Troy Palmer is a 72 y.o. male with the following history as recorded in EpicCare:  Patient Active Problem List   Diagnosis Date Noted  . Cough variant asthma  vs UACS assoc with pnds 02/27/2018  . Asthmatic bronchitis 02/10/2018  . Acute respiratory failure with hypoxia ([place]) 02/09/2018  . Pain, dental 02/03/2018  . Cough 02/03/2018  . Wheezing 02/03/2018  . Pain and swelling of lower leg, right 12/31/2017  . BPH with obstruction/lower urinary tract symptoms 05/16/2017  . Bilateral ankle pain 05/16/2017  . Acute gout 02/26/2017  . Dyspnea 11/16/2016  . Complete tear of right rotator cuff 06/26/2016  . Cellulitis of deltoid region 06/26/2016  . Greater trochanteric bursitis of right hip 05/05/2014  . Right hip pain 05/04/2014  . Posterior neck pain 12/22/2013  . Orchitis, right 12/22/2013  . Paresthesia of both feet 12/22/2013  . Bilateral shoulder pain 09/16/2013  . Allergic angioedema 07/31/2012  . Impaired glucose tolerance 04/15/2012  . Chest pain 02/24/2012  . Foot pain, bilateral 02/20/2012  . Leg pain, bilateral 01/19/2012  . Edema 01/16/2012  . Dysphagia 11/21/2011  . Gout 06/07/2011  . CKD (chronic kidney disease) 06/07/2011  . Wellness examination 02/14/2011  . Encounter for long-term (current) use of other medications 11/08/2010  . CONSTIPATION 09/25/2010  . Memory loss 06/28/2010  . PVD (peripheral vascular disease) ([place]) 02/10/2010  . LEG PAIN, BILATERAL 01/25/2010  . Backache 09/16/2009  . Polymyalgia rheumatica ([place]) 07/26/2008  . POLYARTHRALGIA 07/13/2008  . ESOPHAGEAL STRICTURE 05/26/2008  . HEMORRHOIDS, RECURRENT 02/11/2008  . Hyperlipidemia 12/04/2007  . DEPRESSION 12/04/2007  . Asthma 12/04/2007  . LUNG NODULE 12/04/2007  . GERD 12/04/2007  . DIVERTICULOSIS, COLON 12/04/2007  . COLONIC POLYPS, HX OF 12/04/2007  . ERECTILE DYSFUNCTION 04/09/2007  . SLEEP APNEA, OBSTRUCTIVE, MODERATE 04/09/2007  . Allergic rhinitis 04/09/2007  . BENIGN  PROSTATIC HYPERTROPHY 04/09/2007  . LOW BACK PAIN 04/09/2007  . Morbid obesity due to excess calories ([place]) 03/28/2007  . Hypertension 03/28/2007  . Osteoarth NOS-Unspec 03/28/2007  . Degeneration of cervical intervertebral disc 03/28/2007  . TB SKIN TEST, POSITIVE 03/28/2007    Current Outpatient Medications  Medication Sig Dispense Refill  . acetaminophen (TYLENOL 8 HOUR) 650 MG CR tablet Take 650 mg by mouth every 6 (six) hours as needed (pain/headache).    Marland Kitchen albuterol (PROVENTIL HFA;VENTOLIN HFA) 108 (90 Base) MCG/ACT inhaler Inhale 2 puffs into the lungs every 6 (six) hours as needed for wheezing or shortness of breath. 1 Inhaler 12  . allopurinol (ZYLOPRIM) 100 MG tablet Take 1 tablet (100 mg total) by mouth daily. 90 tablet 0  . amLODipine (NORVASC) 5 MG tablet Take 1 tablet (5 mg total) by mouth daily. 30 tablet 5  . atorvastatin (LIPITOR) 40 MG tablet Take 1 tablet (40 mg total) by mouth daily. 90 tablet 1  . budesonide-formoterol (SYMBICORT) 80-4.5 MCG/ACT inhaler Inhale 2 puffs into the lungs daily.     . colchicine 0.6 MG tablet Take 1 tablet (0.6 mg total) by mouth 2 (two) times daily. 60 tablet 5  . furosemide (LASIX) 40 MG tablet take 1 tablet by mouth every morning AND 1 IN THE EVENING AS NEEDED FOR SWELLING ONLY 60 tablet 11  . gabapentin (NEURONTIN) 100 MG capsule Take 1 capsule (100 mg total) by mouth 3 (three) times daily. 90 capsule 5  . mometasone-formoterol (DULERA) 100-5 MCG/ACT AERO Inhale 2 puffs into the lungs 2 (two) times daily. 1 Inhaler 11  . nebivolol (BYSTOLIC) 10 MG tablet Take  2 tablets (20 mg total) by mouth daily. 60 tablet 5  . pantoprazole (PROTONIX) 40 MG tablet Take 1 tablet (40 mg total) by mouth daily. 90 tablet 3  . tamsulosin (FLOMAX) 0.4 MG CAPS capsule 1 tab by mouth twice per day (Patient taking differently: Take 0.4 mg by mouth daily after breakfast. Pt says he takes 2 tablets at in the morning.) 180 capsule 3  . benzonatate (TESSALON) 100 MG  capsule Take 1 capsule (100 mg total) by mouth 3 (three) times daily as needed. 20 capsule 0  . predniSONE (DELTASONE) 20 MG tablet Take 2 tablets (40 mg total) by mouth daily with breakfast. 10 tablet 0   No current facility-administered medications for this visit.     Allergies: Ace inhibitors; Augmentin [amoxicillin-pot clavulanate]; Doxycycline; and Sulfa antibiotics  Past Medical History:  Diagnosis Date  . ALLERGIC RHINITIS 04/09/2007  . ASTHMA 12/04/2007  . BACK PAIN 09/16/2009  . BENIGN PROSTATIC HYPERTROPHY 04/09/2007  . CEREBROVASCULAR ACCIDENT, HX OF 07/17/2010  . COLONIC POLYPS, HX OF 12/04/2007  . CONSTIPATION 09/25/2010  . Degeneration of cervical intervertebral disc 03/28/2007  . DEPRESSION 12/04/2007  . DIVERTICULOSIS, COLON 12/04/2007  . DYSPHAGIA UNSPECIFIED 03/16/2008  . ERECTILE DYSFUNCTION 04/09/2007  . ESOPHAGEAL STRICTURE 05/26/2008  . GERD 12/04/2007  . HEMORRHOIDS, RECURRENT 02/11/2008  . HYPERLIPIDEMIA 12/04/2007  . HYPERTENSION 03/28/2007  . LEG PAIN, BILATERAL 01/25/2010  . LOW BACK PAIN 04/09/2007  . LUNG NODULE 12/04/2007  . Osteoarth NOS-Unspec 03/28/2007  . Other dysphagia 11/21/2009  . POLYARTHRALGIA 07/13/2008  . Polymyalgia rheumatica ([place]) 07/26/2008  . PVD WITH CLAUDICATION 02/10/2010  . RENAL INSUFFICIENCY 03/15/2010  . SLEEP APNEA, OBSTRUCTIVE, MODERATE 04/09/2007  . TB SKIN TEST, POSITIVE 03/28/2007    Past Surgical History:  Procedure Laterality Date  . ROTATOR CUFF REPAIR     Left  . TOTAL KNEE ARTHROPLASTY     x 2    Family History  Problem Relation Age of Onset  . Heart disease Father   . Lupus Brother   . Hypertension Brother   . Asthma Other     Social History   Tobacco Use  . Smoking status: Never Smoker  . Smokeless tobacco: Never Used  Substance Use Topics  . Alcohol use: No    Subjective:  Patient presents with concerns for cough/ congestion/ asthma flare; seen here on July 5 by his PCP and in the ER on July 10; normal CXR on July 10; saw  pulmonology on July 18 and states he was "doing great" until 3 days ago when his symptoms were caused by the air conditioner in his car- "caused my allergies to flare." Per pulmonology notes, was recently changed to [facility] but notes he cannot tolerate the medication as it was aggravating his throat; states he is taking something "everyday" for his breathing but is unsure of the name of the medication. Requesting refill on his Prednisone and Tessalon perles today; + wheezing;  Patient is a very difficult historian; medication review done with patient and am concerned that he is not taking a daily ICS for his suspected asthma as he told me contradictory statements about his Dulera and Symbicort;  Objective:  Vitals:   03/04/18 1601  BP: (!) 144/82  Pulse: 65  Temp: 97.7 F (36.5 C)  TempSrc: Oral  SpO2: 98%  Weight: 292 lb (132.5 kg)  Height: 5\' 9"  (1.753 m)    General: Well developed, well nourished, in no acute distress  Skin : Warm and dry.  Head: Normocephalic and atraumatic  Eyes: Sclera and conjunctiva clear; pupils round and reactive to light; extraocular movements intact  Ears: External normal; canals clear; tympanic membranes normal  Oropharynx: Pink, supple. No suspicious lesions  Neck: Supple without thyromegaly, adenopathy  Lungs: Respirations unlabored; wheezing in all 4 lobes CVS exam: normal rate and regular rhythm.  Neurologic: Alert and oriented; speech intact; face symmetrical; moves all extremities well; CNII-XII intact without focal deficit   Assessment:  1. Wheezing   2. Mild intermittent asthmatic bronchitis with acute exacerbation     Plan:  Patient is a very difficult historian; medication review done with patient and am concerned that he is not taking a daily ICS for his suspected asthma as he told me contradictory statements about his Dulera and Symbicort; this could explain why he continues to have persisting symptoms. Will hold antibiotic for now; repeat  treatment with prednisone 40 mg qd x 5 days and refill on his Tessalon perles; stressed need to keep his follow-up with pulmonology for August. It would be good for him to take his medications to that appointment in follow-up.   No follow-ups on file.  No orders of the defined types were placed in this encounter.   Requested Prescriptions   Signed Prescriptions Disp Refills  . benzonatate (TESSALON) 100 MG capsule 20 capsule 0    Sig: Take 1 capsule (100 mg total) by mouth 3 (three) times daily as needed.  . predniSONE (DELTASONE) 20 MG tablet 10 tablet 0    Sig: Take 2 tablets (40 mg total) by mouth daily with breakfast.

## 2018-03-04 NOTE — Patient Outreach (Signed)
[facility]) Care Management  03/05/2018  Troy Palmer [birth year] [id number]   Received assistance referral from [place]. Mailed and faxed the following to patient and provider: [employer] (Symbicort) and Allergan (Bystolic) faxed to Dr. Jenny Reichmann. Mailed [employer] (Proventil) to Dr. Jenny Reichmann.  Will follow up with patient in 7-10 business days to confirm applications have been received.  Maud Deed [place], [place] Management [phone]

## 2018-03-11 ENCOUNTER — Other Ambulatory Visit: Payer: Self-pay | Admitting: Internal Medicine

## 2018-03-11 MED ORDER — ALBUTEROL SULFATE HFA 108 (90 BASE) MCG/ACT IN AERS
2.0000 | INHALATION_SPRAY | Freq: Four times a day (QID) | RESPIRATORY_TRACT | 3 refills | Status: DC | PRN
Start: 2018-03-11 — End: 2019-01-19

## 2018-03-18 ENCOUNTER — Institutional Professional Consult (permissible substitution): Payer: PPO | Admitting: Pulmonary Disease

## 2018-03-25 ENCOUNTER — Other Ambulatory Visit (INDEPENDENT_AMBULATORY_CARE_PROVIDER_SITE_OTHER): Payer: PPO

## 2018-03-25 ENCOUNTER — Encounter: Payer: Self-pay | Admitting: Internal Medicine

## 2018-03-25 ENCOUNTER — Ambulatory Visit (INDEPENDENT_AMBULATORY_CARE_PROVIDER_SITE_OTHER): Payer: PPO | Admitting: Internal Medicine

## 2018-03-25 ENCOUNTER — Telehealth: Payer: Self-pay | Admitting: Internal Medicine

## 2018-03-25 VITALS — BP 140/72 | HR 76 | Ht 69.0 in | Wt 291.0 lb

## 2018-03-25 DIAGNOSIS — R0609 Other forms of dyspnea: Secondary | ICD-10-CM

## 2018-03-25 DIAGNOSIS — J45991 Cough variant asthma: Secondary | ICD-10-CM

## 2018-03-25 DIAGNOSIS — R05 Cough: Secondary | ICD-10-CM

## 2018-03-25 DIAGNOSIS — R059 Cough, unspecified: Secondary | ICD-10-CM

## 2018-03-25 LAB — CBC WITH DIFFERENTIAL/PLATELET
Basophils Absolute: 0 10*3/uL (ref 0.0–0.1)
Basophils Relative: 0.6 % (ref 0.0–3.0)
EOS PCT: 3.9 % (ref 0.0–5.0)
Eosinophils Absolute: 0.2 10*3/uL (ref 0.0–0.7)
HCT: 37.6 % — ABNORMAL LOW (ref 39.0–52.0)
Hemoglobin: 12.3 g/dL — ABNORMAL LOW (ref 13.0–17.0)
LYMPHS ABS: 1.3 10*3/uL (ref 0.7–4.0)
Lymphocytes Relative: 27 % (ref 12.0–46.0)
MCHC: 32.7 g/dL (ref 30.0–36.0)
MCV: 84.2 fl (ref 78.0–100.0)
MONO ABS: 1.1 10*3/uL — AB (ref 0.1–1.0)
Monocytes Relative: 22 % — ABNORMAL HIGH (ref 3.0–12.0)
NEUTROS ABS: 2.3 10*3/uL (ref 1.4–7.7)
NEUTROS PCT: 46.5 % (ref 43.0–77.0)
PLATELETS: 244 10*3/uL (ref 150.0–400.0)
RBC: 4.47 Mil/uL (ref 4.22–5.81)
RDW: 14.3 % (ref 11.5–15.5)
WBC: 4.8 10*3/uL (ref 4.0–10.5)

## 2018-03-25 LAB — NITRIC OXIDE: NITRIC OXIDE: 39

## 2018-03-25 LAB — BRAIN NATRIURETIC PEPTIDE: Pro B Natriuretic peptide (BNP): 274 pg/mL — ABNORMAL HIGH (ref 0.0–100.0)

## 2018-03-25 MED ORDER — BENZONATATE 200 MG PO CAPS: 200.0000 mg | ORAL_CAPSULE | Freq: Three times a day (TID) | ORAL | 3 refills | Status: DC | PRN

## 2018-03-25 NOTE — Patient Instructions (Addendum)
Please see patient coordinator before you leave today  to schedule sinus CT  Please remember to go to the lab department downstairs in the basement  for your tests - we will call you with the results when they are available.     See Tammy NP in  2 weeks or  Next available with all your medications, even over the counter meds, separated in two separate bags, the ones you take no matter what vs the ones you stop once you feel better and take only as needed when you feel you need them.   Tammy  will generate for you a new user friendly medication calendar that will put [place] all on the same page re: your medication use.     Without this process, it simply isn't possible to assure that we are providing  your outpatient care  with  the attention to detail we feel you deserve.   If we cannot assure that you're getting that kind of care,  then we cannot manage your problem effectively from this clinic.  Once you have seen Tammy and we are sure that we're all on the same page with your medication use she will arrange follow up with me.

## 2018-03-25 NOTE — Telephone Encounter (Signed)
Called and spoke with pt who stated at today's OV with MW, MW had stated to pt he would call in benzonatate to help with his cough.  Dr. Melvyn Novas, please advise on this for pt. Thanks!

## 2018-03-25 NOTE — Telephone Encounter (Signed)
Yes that was my intention:  Tessalon 200 mg three times daily prn  # 45 with 3 refills

## 2018-03-25 NOTE — Telephone Encounter (Signed)
Spoke with pt. He is aware of MW's response. Rx has been sent in. Nothing further was needed.

## 2018-03-25 NOTE — Progress Notes (Signed)
Troy Palmer, male    DOB: 1945-08-29,     MRN: [id number]    Brief patient profile:  39  yobm never smoker, morbidly obese with h/o ACEi intol   but healthy child /adolscent but in his 75s  Year round itchy, sneezy runny nose eval at [facility] dx allergy took shots for a year early 2000's and helped a little plus med s>>> around 50% improvement in upper airway symtoms    then around 09/2017 acutely worse nasal symptoms for the first time reported assoc with coughing wheezing sob and admitted twice with no improvement in cough plus also nausea and abd pain no better  @ time of discharge "but inhalers helped some " but still hoarse and dry cough so referred to pulmonary clinic 02/27/2018 by Dr   Troy Palmer s/p last admit:   Admit date: 02/09/2018 Discharge date: 02/10/2018   Discharge Diagnoses:  Asthmatic bronchitis   Acute respiratory failure with hypoxia (HCC)   Hyperlipidemia   SLEEP APNEA, OBSTRUCTIVE, MODERATE   Hypertension   POLYARTHRALGIA   CKD (chronic kidney disease)   Paresthesia of both feet   Discharged Condition: good  Hospital Course: Troy Racca Ratliffis a 72 y.o.malewith medical history significant forhypertension, hyperlipidemia, gout no recent flare,BPH, history of asthma, COPD, allergic rhinitis, history of sleep apnea not on CPAP, chronic pain syndrome, recent dental infection, presenting to the ED, with 3-week history of productive yellow cough, and wheezing, worse when lying down, and at night, with mild shortness of breath, especially with exertion during the day. The patient reports generalized malaise, low-grade temperature.He was seen at urgent care facility 3 weeks ago, and was given Augmentin, without improvement of his symptoms, he then was seen by his PCP on 01/27/2018, given a course of Omnicef, and prednisone, without feeling better. He has decreased appetite due to the symptoms.In ED CXR was negative. Pt rec'd albuterol nebs, atrovent and  solumedrol 125 iv and was admitted.       Impression/Plan:  1) Asthmatic bronchitis: improving w/ IV steroids and nebs primarily.   Walking SpO2 is 91% today and wheezing is resolved. No hx smoking doubt COPD.  No purulent sputum of fevers, will dc abx. Suspect this is asthmatic issue. Have d/w pulm MD on the phone, recommending if improved ok to dc on >> - symbicort inhaler (laba/ steroid) - albuterol rescue MDI - 14 day course of cough medication - doubt infection, will dc abx - have scheduled pt to be seen in pulm clinic for new pt appt - also can f/u w PCP as needed  2) CKD stage 3 - creat down to baseline 2.0 today, stable  3) Hypertension - cont norvasc and bystolic  4) Hyperlipidemia -Continue home statins  5)Gout, no acute flare, continue Allopurinol  6)Chronic pain and neuropathy-Continueneurontin, and ultram prn  7)Benign prostatic hypertrophy-no acute issue, continueFlomax      02/27/2018  Pulmonary consultation  Chief Complaint  Patient presents with  . Pulmonary Consult    Referred by Dr. Jonnie Palmer for eval of asthma and bronchitis.  Pt c/o cough off and on "for a while"- occ prod with white sputum.  He is using his albuterol inhaler 2 x daily on average.   Dyspnea:  MMRC1 = can walk nl pace, flat grade, can't hurry or go uphills or steps s sob   Cough: immediately when lie down / was worse/ now better but still disturbs sleep > min white mucus   SABA use: seems to help  some but not symb 80 2bid (not very poor hfa, see a/p)  Zyrtec helps the pnds "some" / not sure about flonase  Extremely evasive historian, initially reported "nothing helped symptoms and everything made symptoms worse" including all rx in 2 admits until I reviewed the record above with him. Plan A = Automatic = dulera 100 Take 2 puffs first thing in am and then another 2 puffs about 12 hours later.  Work on inhaler technique:    Plan B = Backup Only use your albuterol  as a rescue medication  Change pantoprazole to 40 mg Take 30- 60 min before your first and last meals of the day  GERD diet  RTC  with all medications /inhalers      03/25/2018  f/u ov/[name] re: cough flare since 09/2017 now better, off dulera since around 03/03/18 due to throat irritatoin/  did not bring meds as req  Chief Complaint  Patient presents with  . Follow-up    Cough has resolved and his breathing has improved.   Dyspnea:  More tired than sob / back pain can't do HT x sev years  Cough: better but still intermittent sense of pnds / tessalon 100 helps the most  Sleeping: flat  SABA use: none 02: no     No obvious day to day or daytime variability or assoc excess/ purulent sputum or mucus plugs or hemoptysis or cp or chest tightness, subjective wheeze or overt sinus or hb symptoms.   As above  without nocturnal  or early am exacerbation  of respiratory  c/o's or need for noct saba. Also denies any obvious fluctuation of symptoms with weather or environmental changes or other aggravating or alleviating factors except as outlined above   No unusual exposure hx or h/o childhood pna/ asthma or knowledge of premature birth.  Current Allergies, Complete Past Medical History, Past Surgical History, Family History, and Social History were reviewed in [employer] record.  ROS  The following are not active complaints unless bolded Hoarseness, sore throat, dysphagia, dental problems, itching, sneezing,  nasal congestion or discharge of excess mucus or purulent secretions, ear ache,   fever, chills, sweats, unintended wt loss or wt gain, classically pleuritic or exertional cp,  orthopnea pnd or arm/hand swelling  or leg swelling, presyncope, palpitations, abdominal pain, anorexia, nausea, vomiting, diarrhea  or change in bowel habits or change in bladder habits, change in stools or change in urine, dysuria, hematuria,  rash, arthralgias, visual complaints, headache,  numbness, weakness or ataxia or problems with walking or coordination,  change in mood or  memory.        Current Meds not able to verify list is correct  Medication Sig  . acetaminophen (TYLENOL 8 HOUR) 650 MG CR tablet Take 650 mg by mouth every 6 (six) hours as needed (pain/headache).  Marland Kitchen albuterol (PROVENTIL HFA;VENTOLIN HFA) 108 (90 Base) MCG/ACT inhaler Inhale 2 puffs into the lungs every 6 (six) hours as needed for wheezing or shortness of breath.  . allopurinol (ZYLOPRIM) 100 MG tablet Take 1 tablet (100 mg total) by mouth daily.  Marland Kitchen amLODipine (NORVASC) 5 MG tablet Take 1 tablet (5 mg total) by mouth daily.  Marland Kitchen atorvastatin (LIPITOR) 40 MG tablet Take 1 tablet (40 mg total) by mouth daily.  . colchicine 0.6 MG tablet Take 1 tablet (0.6 mg total) by mouth 2 (two) times daily.  . furosemide (LASIX) 40 MG tablet take 1 tablet by mouth every morning AND 1 IN THE  EVENING AS NEEDED FOR SWELLING ONLY  . gabapentin (NEURONTIN) 100 MG capsule Take 1 capsule (100 mg total) by mouth 3 (three) times daily.  . nebivolol (BYSTOLIC) 10 MG tablet Take 2 tablets (20 mg total) by mouth daily.  . pantoprazole (PROTONIX) 40 MG tablet Take 1 tablet (40 mg total) by mouth daily.  . tamsulosin (FLOMAX) 0.4 MG CAPS capsule 1 tab by mouth twice per day (Patient taking differently: Take 0.4 mg by mouth daily after breakfast. Pt says he takes 2 tablets at in the morning.)            Objective:     amb obese bm nad   03/25/2018       291   02/27/18 290 lb (131.5 kg)  02/19/18 280 lb (127 kg)  02/14/18 286 lb (129.7 kg)    Vital signs reviewed - Note on arrival 02 sats  96% on RA      HEENT: nl dentition, Nl external ear canals without cough reflex/  moderate bilateral non-specific turbinate edema  With mp secretions/  Modified Mallampati Score =   2/3   NECK :  without JVD/Nodes/TM/ nl carotid upstrokes bilaterally   LUNGS: no acc muscle use,  Nl contour chest which is clear to A and P bilaterally  without cough on insp or exp maneuvers   CV:  RRR  no s3 or murmur or increase in P2, and 1+ pitting edema both LE's sym  ABD:  Quite obese but soft and nontender with nl inspiratory excursion in the supine position. No bruits or organomegaly appreciated, bowel sounds nl  MS:  Nl gait/ ext warm without deformities, calf tenderness, cyanosis or clubbing No obvious joint restrictions   SKIN: warm and dry without lesions    NEURO:  alert, approp, nl sensorium with  no motor or cerebellar deficits apparent.      Labs ordered 03/25/2018   Allergy profile     Labs ordered/ reviewed:    Lab Results  Component Value Date   WBC 4.8 03/25/2018   HGB 12.3 (L) 03/25/2018   HCT 37.6 (L) 03/25/2018   MCV 84.2 03/25/2018   PLT 244.0 03/25/2018      Lab Results  Component Value Date   PROBNP 274.0 (H) 03/25/2018             Assessment

## 2018-03-26 ENCOUNTER — Encounter: Payer: Self-pay | Admitting: Internal Medicine

## 2018-03-26 LAB — RESPIRATORY ALLERGY PROFILE REGION II ~~LOC~~
Allergen, A. alternata, m6: <0.1 kU/L
Allergen, Cedar tree, t12: <0.1 kU/L
Allergen, Comm Silver Birch, t9: <0.1 kU/L
Allergen, Mulberry, t76: <0.1 kU/L
Allergen, P. notatum, m1: <0.1 kU/L
CLADOSPORIUM HERBARUM (M2) IGE: <0.1 kU/L
CLASS: 0
CLASS: 0
CLASS: 0
CLASS: 0
CLASS: 0
CLASS: 0
CLASS: 0
CLASS: 0
CLASS: 0
CLASS: 0
CLASS: 0
COMMON RAGWEED (SHORT) (W1) IGE: <0.1 kU/L
Cat Dander: <0.1 kU/L
Class: 0
Class: 0
Class: 0
Class: 0
Class: 0
Class: 0
Class: 0
Class: 0
Class: 0
Class: 0
Class: 0
Class: 0
Class: 0
IgE (Immunoglobulin E), Serum: 26 kU/L (ref <=114)
Johnson Grass: <0.1 kU/L
Pecan/Hickory Tree IgE: <0.1 kU/L
Rough Pigweed  IgE: <0.1 kU/L

## 2018-03-26 LAB — INTERPRETATION:

## 2018-03-26 NOTE — Assessment & Plan Note (Signed)
Body mass index is 42.97 kg/m.  -  trending up still  Lab Results  Component Value Date   TSH 2.09 08/16/2017     Contributing to gerd risk/ doe/reviewed the need and the process to achieve and maintain neg calorie balance > defer f/u primary care including intermittently monitoring thyroid status

## 2018-03-26 NOTE — Assessment & Plan Note (Addendum)
FENO 02/27/2018  =    19  p recent pred  - 02/27/2018  After extensive coaching inhaler device  effectiveness =    75% from a baseline on < 25% : try dulera 100 2bid and max rx for gerd > could not tol dulera so d/c'd after 4 days - FENO 03/25/2018  =  39 off all rx  - Spirometry 03/25/2018  FEV1 2.10 (76%)  Ratio 83 on no rx - Allergy profile 03/25/2018 >  Eos 0. /  IgE    Pending - Sinus CT ordered   Hard to tell how much of the problem is asthma vs uacs and won't be able to make any progress at all in this regard until do full /accurate/ complete med reconciliation pending f/u ct sinus/ allergy eval as above so no changes in meds needed for now.   To keep things simple, I have asked the patient to first separate medicines that are perceived as maintenance, that is to be taken daily "no matter what", from those medicines that are taken on only on an as-needed basis and I have given the patient examples of both, and then return to see our NP to generate a  detailed  medication calendar which should be followed until the next physician sees the patient and updates it.    If he is taking meds correctly the next step in the w/u is methacholine challenge testing if he is not better to his satisfaction on f/u or continuing to have flares as he could have both asthma and uacs combined and what works for the asthma (ICS) makes the uacs worse.   I had an extended discussion with the patient reviewing all relevant studies completed to date and  lasting 15 to 20 minutes of a 25 minute visit    Each maintenance medication was reviewed in detail including most importantly the difference between maintenance and prns and under what circumstances the prns are to be triggered using an action plan format that is not reflected in the computer generated alphabetically organized AVS.    Please see AVS for specific instructions unique to this visit that I personally wrote and verbalized to the the pt in detail and then  reviewed with pt  by my nurse highlighting any  changes in therapy recommended at today's visit to their plan of care.

## 2018-03-26 NOTE — Assessment & Plan Note (Addendum)
bnp slt elevated but no overt chf (though last echo 1 year ago did show LAE)  significant anemia or thyroid dz or significant cri.  No change rx other than work on wt loss - consider repeat echo if sob worse

## 2018-03-27 NOTE — Progress Notes (Signed)
Spoke with pt and notified of results per Dr. Wert. Pt verbalized understanding and denied any questions. 

## 2018-03-28 ENCOUNTER — Other Ambulatory Visit: Payer: Self-pay | Admitting: Internal Medicine

## 2018-04-02 ENCOUNTER — Inpatient Hospital Stay: Admission: RE | Admit: 2018-04-02 | Payer: PPO | Source: Ambulatory Visit

## 2018-04-02 ENCOUNTER — Other Ambulatory Visit: Payer: PPO

## 2018-04-07 ENCOUNTER — Ambulatory Visit (INDEPENDENT_AMBULATORY_CARE_PROVIDER_SITE_OTHER)
Admission: RE | Admit: 2018-04-07 | Discharge: 2018-04-07 | Disposition: A | Discharge status: Home / Self Care | Payer: PPO | Source: Ambulatory Visit | Attending: Internal Medicine | Admitting: Internal Medicine

## 2018-04-07 ENCOUNTER — Inpatient Hospital Stay: Admission: RE | Admit: 2018-04-07 | Payer: PPO | Source: Ambulatory Visit

## 2018-04-07 DIAGNOSIS — J45991 Cough variant asthma: Secondary | ICD-10-CM | POA: Diagnosis not present

## 2018-04-07 DIAGNOSIS — J320 Chronic maxillary sinusitis: Secondary | ICD-10-CM | POA: Diagnosis not present

## 2018-04-07 DIAGNOSIS — R05 Cough: Secondary | ICD-10-CM

## 2018-04-07 DIAGNOSIS — R059 Cough, unspecified: Secondary | ICD-10-CM

## 2018-04-08 ENCOUNTER — Encounter: Payer: Self-pay | Admitting: Internal Medicine

## 2018-04-08 ENCOUNTER — Encounter: Payer: PPO | Admitting: Adult Health

## 2018-04-08 ENCOUNTER — Ambulatory Visit (INDEPENDENT_AMBULATORY_CARE_PROVIDER_SITE_OTHER): Payer: PPO | Admitting: Internal Medicine

## 2018-04-08 VITALS — BP 132/70 | HR 66 | Ht 69.0 in | Wt 288.6 lb

## 2018-04-08 DIAGNOSIS — J45991 Cough variant asthma: Secondary | ICD-10-CM

## 2018-04-08 NOTE — Patient Instructions (Signed)
Pantoprazole (protonix) 40 mg   Take  30-60 min before first meal of the day and Pepcid (famotidine)  20 mg one hour before  Bedtime x month  this is the best way to tell whether stomach acid is contributing to your problem.     For drainage / throat tickle try take CHLORPHENIRAMINE  4 mg - take one every 4 hours as needed - available over the counter- may cause drowsiness so start with just one pill  or two one hour before bedtime  and see how you tolerate it before trying in daytime    Call me in a month for ENT referral if not better to your satisfaction

## 2018-04-08 NOTE — Progress Notes (Signed)
Troy Palmer, male    DOB: [birth year],     MRN: [id number]    Brief patient profile:  24  yobm never smoker-  morbidly obese with h/o ACEi intol  -  healthy child /adolscent but in his 100s  Year round itchy, sneezy runny nose eval at [facility] dx allergy took shots for a year early 2000's and helped a little plus meds  >>> around 50% improvement in upper airway symtoms then around 09/2017 acutely worse nasal symptoms for the first time reported assoc with coughing wheezing sob and admitted twice with no improvement in cough plus also nausea and abd pain no better  @ time of discharge "but inhalers helped some " but still hoarse and dry cough so referred to pulmonary clinic 02/27/2018 by Dr   Jonnie Finner s/p last admit:   Admit date: 02/09/2018 Discharge date: 02/10/2018   Discharge Diagnoses:  Asthmatic bronchitis   Acute respiratory failure with hypoxia ([place])   Hyperlipidemia   SLEEP APNEA, OBSTRUCTIVE, MODERATE   Hypertension   POLYARTHRALGIA   CKD (chronic kidney disease)   Paresthesia of both feet   Discharged Condition: good  Hospital Course: Troy Sinclair Ratliffis a 72 Palmer medical history significant forhypertension, hyperlipidemia, gout no recent flare,BPH, history of asthma, COPD, allergic rhinitis, history of sleep apnea not on CPAP, chronic pain syndrome, recent dental infection, presenting to the ED, with 3-week history of productive yellow cough, and wheezing, worse when lying down, and at night, with mild shortness of breath, especially with exertion during the day. The patient reports generalized malaise, low-grade temperature.He was seen at urgent care facility 3 weeks ago, and was given Augmentin, without improvement of his symptoms, he then was seen by his PCP on 01/27/2018, given a course of Omnicef, and prednisone, without feeling better. He has decreased appetite due to the symptoms.In ED CXR was negative. Pt rec'd albuterol nebs, atrovent and  solumedrol 125 iv and was admitted.       Impression/Plan:  1) Asthmatic bronchitis: improving w/ IV steroids and nebs primarily.   Walking SpO2 is 91% today and wheezing is resolved. No hx smoking doubt COPD.  No purulent sputum of fevers, will dc abx. Suspect this is asthmatic issue. Have d/w pulm MD on the phone, recommending if improved ok to dc on >> - symbicort inhaler (laba/ steroid) - albuterol rescue MDI - 14 day course of cough medication - doubt infection, will dc abx - have scheduled pt to be seen in pulm clinic for new pt appt - also can f/u w PCP as needed  2) CKD stage 3 - creat down to baseline 2.0 today, stable  3) Hypertension - cont norvasc and bystolic  4) Hyperlipidemia -Continue home statins  5)Gout, no acute flare, continue Allopurinol  6)Chronic pain and neuropathy-Continueneurontin, and ultram prn  7)Benign prostatic hypertrophy-no acute issue, continueFlomax      02/27/2018  Pulmonary consultation  Chief Complaint  Patient presents with  . Pulmonary Consult    Referred by Dr. Jonnie Finner for eval of asthma and bronchitis.  Pt c/o cough off and on "for a while"- occ prod with white sputum.  He is using his albuterol inhaler 2 x daily on average.   Dyspnea:  MMRC1 = can walk nl pace, flat grade, can't hurry or go uphills or steps s sob   Cough: immediately when lie down / was worse/ now better but still disturbs sleep > min white mucus   SABA use: seems to help some  but not symb 80 2bid (not very poor hfa, see a/p)  Zyrtec helps the pnds "some" / not sure about flonase  Extremely evasive historian, initially reported "nothing helped symptoms and everything made symptoms worse" including all rx in 2 admits until I reviewed the record above with him. rec Plan A = Automatic = dulera 100 Take 2 puffs first thing in am and then another 2 puffs about 12 hours later.  Work on inhaler technique:    Plan B = Backup Only use your  albuterol as a rescue medication  Change pantoprazole to 40 mg Take 30- 60 min before your first and last meals of the day  GERD diet  RTC  with all medications /inhalers      03/25/2018  f/u ov/Reyanna Baley re: cough flare since 09/2017 now better, off dulera since around 03/03/18 due to throat irritatoin/  did not bring meds as req  Chief Complaint  Patient presents with  . Follow-up    Cough has resolved and his breathing has improved.   Dyspnea:  More tired than sob / back pain can't do HT x sev years  Cough: better but still intermittent sense of pnds / tessalon 100 helps the most  Sleeping: flat  SABA use: none 02: no   rec Please see patient coordinator before you leave today  to schedule sinus CT Please remember to go to the lab department downstairs in the basement  for your tests - we will call you with the results when they are available.  See Tammy NP in  2 weeks  For med reconciliation > not done     04/08/2018  f/u ov/Jawan Chavarria re: did not bring meds as req/   Asthma vs uacs  Chief Complaint  Patient presents with  . Follow-up    Scheduled for med cal but did not bring meds. He states wants to discuss results of sinus ct scan.    Dyspnea:  MMRC1 = can walk nl pace, flat grade, can't hurry or go uphills or steps s sob   Cough: clearing throat at hs worse Sleeping: sometime worse with drainage bothering him but not excess mucus  SABA use: over a month since used 02: none   No obvious day to day or daytime variability or assoc excess/ purulent sputum or mucus plugs or hemoptysis or cp or chest tightness, subjective wheeze or overt sinus or hb symptoms.     Also denies any obvious fluctuation of symptoms with weather or environmental changes or other aggravating or alleviating factors except as outlined above   No unusual exposure hx or h/o childhood pna/ asthma or knowledge of premature birth.  Current Allergies, Complete Past Medical History, Past Surgical History, Family  History, and Social History were reviewed in [employer] record.  ROS  The following are not active complaints unless bolded Hoarseness, sore throat, dysphagia, dental problems, itching, sneezing,  nasal congestion or discharge of excess mucus or purulent secretions, ear ache,   fever, chills, sweats, unintended wt loss or wt gain, classically pleuritic or exertional cp,  orthopnea pnd or arm/hand swelling  or leg swelling, presyncope, palpitations, abdominal pain, anorexia, nausea, vomiting, diarrhea  or change in bowel habits or change in bladder habits, change in stools or change in urine, dysuria, hematuria,  rash, arthralgias, visual complaints, headache, numbness, weakness or ataxia or problems with walking or coordination,  change in mood or  memory.        Current Meds  Medication Sig  .  acetaminophen (TYLENOL 8 HOUR) 650 MG CR tablet Take 650 mg by mouth every 6 (six) hours as needed (pain/headache).  Marland Kitchen albuterol (PROVENTIL HFA;VENTOLIN HFA) 108 (90 Base) MCG/ACT inhaler Inhale 2 puffs into the lungs every 6 (six) hours as needed for wheezing or shortness of breath.  . allopurinol (ZYLOPRIM) 100 MG tablet Take 1 tablet (100 mg total) by mouth daily.  Marland Kitchen amLODipine (NORVASC) 5 MG tablet Take 1 tablet (5 mg total) by mouth daily.  Marland Kitchen atorvastatin (LIPITOR) 40 MG tablet TAKE 1 TABLET(40 MG) BY MOUTH DAILY  . benzonatate (TESSALON) 200 MG capsule Take 1 capsule (200 mg total) by mouth 3 (three) times daily as needed for cough.  . colchicine 0.6 MG tablet Take 1 tablet (0.6 mg total) by mouth 2 (two) times daily.  . furosemide (LASIX) 40 MG tablet take 1 tablet by mouth every morning AND 1 IN THE EVENING AS NEEDED FOR SWELLING ONLY  . gabapentin (NEURONTIN) 100 MG capsule Take 1 capsule (100 mg total) by mouth 3 (three) times daily.  . nebivolol (BYSTOLIC) 10 MG tablet Take 2 tablets (20 mg total) by mouth daily.  . pantoprazole (PROTONIX) 40 MG tablet Take 1 tablet (40 mg  total) by mouth daily.  . tamsulosin (FLOMAX) 0.4 MG CAPS capsule 1 tab by mouth twice per day (Patient taking differently: Take 0.4 mg by mouth daily after breakfast. Pt says he takes 2 tablets at in the morning.)              Objective:     amb obese bm nad   04/08/2018       288  03/25/2018       291   02/27/18 290 lb (131.5 kg)  02/19/18 280 lb (127 kg)  02/14/18 286 lb (129.7 kg)    Vital signs reviewed - Note on arrival 02 sats  99% on RA        HEENT: nl dentition, and oropharynx. Nl external ear canals without cough reflex - moderate bilateral non-specific turbinate edema     NECK :  without JVD/Nodes/TM/ nl carotid upstrokes bilaterally   LUNGS: no acc muscle use,  Nl contour chest which is clear to A and P bilaterally without cough on insp or exp maneuvers   CV:  RRR  no s3 or murmur or increase in P2, and no edema   ABD:  soft and nontender with nl inspiratory excursion in the supine position. No bruits or organomegaly appreciated, bowel sounds nl  MS:  Nl gait/ ext warm without deformities, calf tenderness, cyanosis or clubbing No obvious joint restrictions   SKIN: warm and dry without lesions    NEURO:  alert, approp, nl sensorium with  no motor or cerebellar deficits apparent.          I personally reviewed images and agree with radiology impression as follows:   CT of Sinus 04/07/18 Chronic BILATERAL maxillary sinusitis, greater on the LEFT                Assessment

## 2018-04-10 ENCOUNTER — Encounter: Payer: Self-pay | Admitting: Internal Medicine

## 2018-04-10 NOTE — Assessment & Plan Note (Signed)
Body mass index is 42.62 kg/m.  -  trending down slightly/ encouraged  Lab Results  Component Value Date   TSH 2.09 08/16/2017     Contributing to gerd risk/ doe/reviewed the need and the process to achieve and maintain neg calorie balance > defer f/u primary care including intermittently monitoring thyroid status

## 2018-04-10 NOTE — Assessment & Plan Note (Addendum)
FENO 02/27/2018  =    19  p recent prednisone completion   - 02/27/2018  After extensive coaching inhaler device  effectiveness =    75% from a baseline on < 25% : try dulera 100 2bid and max rx for gerd > could not tol dulera so d/c'd after 4 days - FENO 03/25/2018  =  39 off all rx  - Allergy profile 03/25/18  >  Eos 0.2 /  IgE  26  RAST neg - Spirometry 03/25/2018  FEV1 2.10 (76%)  Ratio 83 on no rx    - Sinus CT 04/07/2018 > Chronic BILATERAL maxillary sinusitis, greater on the LEFT - 04/08/2018 trial of max rx for gerd/ chronic rhinitis with 1st gen H1 blockers per guidelines  04/08/2018   No clinical evidence of asthma at this point but happy to see if flares with all meds in hand using a trust but verify approach to confirm accurate Medication  Reconciliation The principal here is that until we are certain that the  patients are doing what we've asked, it makes no sense to ask them to do more.   If not better next step is ent eval   Each maintenance medication was reviewed in detail including most importantly the difference between maintenance and as needed and under what circumstances the prns are to be used.  Please see AVS for specific  Instructions which are unique to this visit and I personally typed out  which were reviewed in detail in writing with the patient and a copy provided.

## 2018-04-17 ENCOUNTER — Other Ambulatory Visit: Payer: Self-pay | Admitting: Pharmacy Technician

## 2018-04-17 NOTE — Patient Outreach (Signed)
[facility]) Care Management  04/17/2018  FARD BORUNDA [birth year] [id number]   Successful outreach call, patient would not confirm his identity therefore no drug names were discussed. Patient states that he was unclear about any patient assistance forms. I informed him that they were to help him with the cost of some of his medications. He said they might be in in his mailbox, he would check.  Will follow up with patient in 7-10 business days if I have not received a call or applications.  Maud Deed [place], [place] Management [phone]

## 2018-04-29 DIAGNOSIS — H1013 Acute atopic conjunctivitis, bilateral: Secondary | ICD-10-CM | POA: Diagnosis not present

## 2018-04-29 DIAGNOSIS — H40033 Anatomical narrow angle, bilateral: Secondary | ICD-10-CM | POA: Diagnosis not present

## 2018-05-05 ENCOUNTER — Other Ambulatory Visit (INDEPENDENT_AMBULATORY_CARE_PROVIDER_SITE_OTHER): Payer: PPO

## 2018-05-05 ENCOUNTER — Encounter: Payer: Self-pay | Admitting: Internal Medicine

## 2018-05-05 ENCOUNTER — Ambulatory Visit (INDEPENDENT_AMBULATORY_CARE_PROVIDER_SITE_OTHER): Payer: PPO | Admitting: Internal Medicine

## 2018-05-05 VITALS — BP 124/60 | HR 63 | Temp 97.5°F | Ht 69.0 in | Wt 284.0 lb

## 2018-05-05 DIAGNOSIS — R202 Paresthesia of skin: Secondary | ICD-10-CM

## 2018-05-05 DIAGNOSIS — I10 Essential (primary) hypertension: Secondary | ICD-10-CM

## 2018-05-05 DIAGNOSIS — Z23 Encounter for immunization: Secondary | ICD-10-CM | POA: Diagnosis not present

## 2018-05-05 DIAGNOSIS — R7302 Impaired glucose tolerance (oral): Secondary | ICD-10-CM

## 2018-05-05 LAB — VITAMIN B12: VITAMIN B 12: 387 pg/mL (ref 211–911)

## 2018-05-05 NOTE — Patient Instructions (Signed)
Please continue all other medications as before, and refills have been done if requested.  Please have the pharmacy call with any other refills you may need.  Please continue your efforts at being more active, low cholesterol diet, and weight control.  You are otherwise up to date with prevention measures today.  Please keep your appointments with your specialists as you may have planned  You will be contacted regarding the referral for: Nerve Testing for the legs  Please go to the LAB in the Basement (turn left off the elevator) for the tests to be done today - just the B12 level today  You will be contacted by phone if any changes need to be made immediately.  Otherwise, you will receive a letter about your results with an explanation, but please check with MyChart first.  Please remember to sign up for MyChart if you have not done so, as this will be important to you in the future with finding out test results, communicating by private email, and scheduling acute appointments online when needed.

## 2018-05-05 NOTE — Progress Notes (Signed)
Subjective:    Patient ID: Troy Palmer, male    DOB: [birth year], 72 y.o.   MRN: [id number]  HPI  Here with c/o legs and feet cold where has to wrap them up at home, also with numbness; also has some swelling with neg recent venous dopplers and also prior arterial dopplers done 2013; no recent b12.  Does not want trial of medication for now such as elavil.  Pt denies chest pain, increased sob or doe, wheezing, orthopnea, PND, palpitations, dizziness or syncope.  Pt denies new neurological symptoms such as new headache, or facial or extremity weakness or numbness Past Medical History:  Diagnosis Date  . ALLERGIC RHINITIS 04/09/2007  . ASTHMA 12/04/2007  . BACK PAIN 09/16/2009  . BENIGN PROSTATIC HYPERTROPHY 04/09/2007  . CEREBROVASCULAR ACCIDENT, HX OF 07/17/2010  . COLONIC POLYPS, HX OF 12/04/2007  . CONSTIPATION 09/25/2010  . Degeneration of cervical intervertebral disc 03/28/2007  . DEPRESSION 12/04/2007  . DIVERTICULOSIS, COLON 12/04/2007  . DYSPHAGIA UNSPECIFIED 03/16/2008  . ERECTILE DYSFUNCTION 04/09/2007  . ESOPHAGEAL STRICTURE 05/26/2008  . GERD 12/04/2007  . HEMORRHOIDS, RECURRENT 02/11/2008  . HYPERLIPIDEMIA 12/04/2007  . HYPERTENSION 03/28/2007  . LEG PAIN, BILATERAL 01/25/2010  . LOW BACK PAIN 04/09/2007  . LUNG NODULE 12/04/2007  . Osteoarth NOS-Unspec 03/28/2007  . Other dysphagia 11/21/2009  . POLYARTHRALGIA 07/13/2008  . Polymyalgia rheumatica ([place]) 07/26/2008  . PVD WITH CLAUDICATION 02/10/2010  . RENAL INSUFFICIENCY 03/15/2010  . SLEEP APNEA, OBSTRUCTIVE, MODERATE 04/09/2007  . TB SKIN TEST, POSITIVE 03/28/2007   Past Surgical History:  Procedure Laterality Date  . ROTATOR CUFF REPAIR     Left  . TOTAL KNEE ARTHROPLASTY     x 2    reports that he has never smoked. He has never used smokeless tobacco. He reports that he does not drink alcohol or use drugs. family history includes Asthma in his other; Heart disease in his father; Hypertension in his brother; Lupus in his  brother. Allergies  Allergen Reactions  . Ace Inhibitors Swelling    Angioedema throat  . Augmentin [Amoxicillin-Pot Clavulanate] Other (See Comments)    Marked weakness with po med x 3 doses  . Doxycycline     Some GI upset  . Sulfa Antibiotics Hives    And facial angioedema   Current Outpatient Medications on File Prior to Visit  Medication Sig Dispense Refill  . acetaminophen (TYLENOL 8 HOUR) 650 MG CR tablet Take 650 mg by mouth every 6 (six) hours as needed (pain/headache).    Marland Kitchen albuterol (PROVENTIL HFA;VENTOLIN HFA) 108 (90 Base) MCG/ACT inhaler Inhale 2 puffs into the lungs every 6 (six) hours as needed for wheezing or shortness of breath. 3 Inhaler 3  . allopurinol (ZYLOPRIM) 100 MG tablet Take 1 tablet (100 mg total) by mouth daily. 90 tablet 0  . amLODipine (NORVASC) 5 MG tablet Take 1 tablet (5 mg total) by mouth daily. 30 tablet 5  . atorvastatin (LIPITOR) 40 MG tablet TAKE 1 TABLET(40 MG) BY MOUTH DAILY 90 tablet 1  . benzonatate (TESSALON) 200 MG capsule Take 1 capsule (200 mg total) by mouth 3 (three) times daily as needed for cough. 45 capsule 3  . colchicine 0.6 MG tablet Take 1 tablet (0.6 mg total) by mouth 2 (two) times daily. 60 tablet 5  . furosemide (LASIX) 40 MG tablet take 1 tablet by mouth every morning AND 1 IN THE EVENING AS NEEDED FOR SWELLING ONLY 60 tablet 11  . gabapentin (NEURONTIN) 100 MG capsule  Take 1 capsule (100 mg total) by mouth 3 (three) times daily. 90 capsule 5  . nebivolol (BYSTOLIC) 10 MG tablet Take 2 tablets (20 mg total) by mouth daily. 60 tablet 5  . pantoprazole (PROTONIX) 40 MG tablet Take 1 tablet (40 mg total) by mouth daily. 90 tablet 3  . tamsulosin (FLOMAX) 0.4 MG CAPS capsule 1 tab by mouth twice per day (Patient taking differently: Take 0.4 mg by mouth daily after breakfast. Pt says he takes 2 tablets at in the morning.) 180 capsule 3   No current facility-administered medications on file prior to visit.    Review of Systems   Constitutional: Negative for other unusual diaphoresis or sweats HENT: Negative for ear discharge or swelling Eyes: Negative for other worsening visual disturbances Respiratory: Negative for stridor or other swelling  Gastrointestinal: Negative for worsening distension or other blood Genitourinary: Negative for retention or other urinary change Musculoskeletal: Negative for other MSK pain or swelling Skin: Negative for color change or other new lesions Neurological: Negative for worsening tremors and other numbness  Psychiatric/Behavioral: Negative for worsening agitation or other fatigue All other system neg per pt    Objective:   Physical Exam BP 124/60 (BP Location: Left Arm, Patient Position: Sitting, Cuff Size: Large)   Pulse 63   Temp (!) 97.5 F (36.4 C) (Oral)   Ht 5\' 9"  (1.753 m)   Wt 284 lb (128.8 kg)   SpO2 94%   BMI 41.94 kg/m  VS noted,  Constitutional: Pt appears in NAD HENT: Head: NCAT.  Right Ear: External ear normal.  Left Ear: External ear normal.  Eyes: . Pupils are equal, round, and reactive to light. Conjunctivae and EOM are normal Nose: without d/c or deformity Neck: Neck supple. Gross normal ROM Cardiovascular: Normal rate and regular rhythm.   Pulmonary/Chest: Effort normal and breath sounds without rales or wheezing.  Abd:  Soft, NT, ND, + BS, no organomegaly Neurological: Pt is alert. At baseline orientation, motor grossly intact Skin: Skin is warm. No rashes, other new lesions, no LE edema Psychiatric: Pt behavior is normal without agitation  No other exam findings Lab Results  Component Value Date   WBC 4.8 03/25/2018   HGB 12.3 (L) 03/25/2018   HCT 37.6 (L) 03/25/2018   PLT 244.0 03/25/2018   GLUCOSE 119 (H) 02/19/2018   CHOL 147 08/16/2017   TRIG 69.0 08/16/2017   HDL 40.00 08/16/2017   LDLDIRECT 156.9 10/10/2010   LDLCALC 93 08/16/2017   ALT 35 02/09/2018   AST 28 02/09/2018   NA 142 02/19/2018   K 3.9 02/19/2018   CL 109 02/19/2018    CREATININE 2.24 (H) 02/19/2018   BUN 21 02/19/2018   CO2 23 02/19/2018   TSH 2.09 08/16/2017   PSA 0.98 08/16/2017   HGBA1C 6.5 (H) 02/09/2018       Assessment & Plan:

## 2018-05-05 NOTE — Assessment & Plan Note (Signed)
stable overall by history and exam, recent data reviewed with pt, and pt to continue medical treatment as before,  to f/u any worsening symptoms or concerns BP Readings from Last 3 Encounters:  05/05/18 124/60  04/08/18 132/70  03/25/18 140/72

## 2018-05-05 NOTE — Assessment & Plan Note (Signed)
stable overall by history and exam, recent data reviewed with pt, and pt to continue medical treatment as before,  to f/u any worsening symptoms or concerns Lab Results  Component Value Date   HGBA1C 6.5 (H) 02/09/2018

## 2018-05-05 NOTE — Assessment & Plan Note (Signed)
C/w prob neuropathic symptoms, for LE NCS/EMG, b12 level, consider elavil trial and/or neuro referral

## 2018-05-06 ENCOUNTER — Encounter: Payer: Self-pay | Admitting: Neurology

## 2018-05-07 ENCOUNTER — Other Ambulatory Visit: Payer: Self-pay | Admitting: *Deleted

## 2018-05-07 DIAGNOSIS — R200 Anesthesia of skin: Secondary | ICD-10-CM

## 2018-05-20 ENCOUNTER — Other Ambulatory Visit: Payer: Self-pay

## 2018-05-20 NOTE — Patient Outreach (Signed)
[facility]) Care Management  05/20/2018  Troy Palmer 1946-08-06 [id number]  Successful outreach call to [name] and HIPAA identifiers verified.   Medication Assistance: [name] reports that his Symbicort was discontinued by Dr. Melvyn Novas.  He states that he is not longer interested in continuing with the patient assistance applications.  He states that he would like help with getting some dental work.  Plan: Social work referral to see if there are community services for free or reduced fee dental services.   Close [place] case and route case closure letter to PCP, Dr. Jenny Reichmann.  [name], PharmD Clinical Pharmacist [place] [phone]

## 2018-05-26 ENCOUNTER — Other Ambulatory Visit: Payer: Self-pay

## 2018-05-26 NOTE — Patient Outreach (Signed)
[facility]) Care Management  05/26/2018  MIACHEL NARDELLI [birth year] [id number]   Initial outreach to Mr. Rodriquez regarding social work referral for dental resources.  Mr. Ander reported that he has a lot of dental work that needs to be done but it's not all covered by his insurance and he cannot afford to pay out of pocket.  BSW completed application with Mr. Sittner via phone for financial assistance through the [employer].  Mr. Szczesniak should receive information via mail within the next month regarding eligibility.     BSW also informed Mr. Attwood of a dental clinic that is being offered on 11/07/18-11/08/18 in [facility] through [facility] [place].  BSW also provided him with contact number so that he can call to get more information about this clinic.  BSW will follow up within the next month to see if he has received anything from the [employer].  Ronn Melena, [place] Social Worker [phone]

## 2018-05-29 ENCOUNTER — Encounter: Payer: Self-pay | Admitting: Internal Medicine

## 2018-05-29 ENCOUNTER — Ambulatory Visit (INDEPENDENT_AMBULATORY_CARE_PROVIDER_SITE_OTHER): Payer: PPO | Admitting: Neurology

## 2018-05-29 ENCOUNTER — Other Ambulatory Visit: Payer: Self-pay | Admitting: Internal Medicine

## 2018-05-29 ENCOUNTER — Encounter

## 2018-05-29 DIAGNOSIS — G629 Polyneuropathy, unspecified: Secondary | ICD-10-CM | POA: Insufficient documentation

## 2018-05-29 DIAGNOSIS — G6289 Other specified polyneuropathies: Secondary | ICD-10-CM

## 2018-05-29 DIAGNOSIS — R200 Anesthesia of skin: Secondary | ICD-10-CM | POA: Diagnosis not present

## 2018-05-29 HISTORY — DX: Polyneuropathy, unspecified: G62.9

## 2018-05-29 NOTE — Procedures (Signed)
[facility] Neurology  [address]  [place], [place] [address] Tel: [phone] Fax:  [phone] Test Date:  05/29/2018  Patient: Troy Palmer DOB: October 08, 1945 Physician: Narda Amber, DO  Sex: Male Height: 5\' 9"  Ref Phys: Cathlean Cower, MD  ID#: [id number] Temp: 33C Technician:    Patient Complaints: This is a 72 year old man referred for evaluation of bilateral leg numbness and cold sensation.  NCV & EMG Findings: Extensive electrodiagnostic testing of the right lower extremity and additional studies of the left shows:  1. Bilateral sural and superficial peroneal sensory responses are absent. 2. Bilateral tibial motor responses show reduced amplitude.  Bilateral peroneal motor responses are within normal limits. 3. Bilateral tibial H reflex study showed prolonged latencies. 4. Chronic motor axonal loss changes are seen affecting the muscles below the knee as well as biceps femoris short head muscles.  There is no evidence of accompanied active denervation.   Impression: 1. The electrophysiologic findings are most consistent with a chronic sensorimotor axonal polyneuropathy affecting the lower extremities.   2. There is also evidence of a superimposed bilateral and chronic chronic S1 radiculopathy, which is mild in degree electrically     ___________________________ Narda Amber, DO    Nerve Conduction Studies Anti Sensory Summary Table   Site NR Peak (ms) Norm Peak (ms) P-T Amp (V) Norm P-T Amp  Left Sup Peroneal Anti Sensory (Ant Lat Mall)  33C  12 cm NR  <4.6  >3  Right Sup Peroneal Anti Sensory (Ant Lat Mall)  33C  12 cm NR  <4.6  >3  Left Sural Anti Sensory (Lat Mall)  33C  Calf NR  <4.6  >3  Right Sural Anti Sensory (Lat Mall)  33C  Calf NR  <4.6  >3   Motor Summary Table   Site NR Onset (ms) Norm Onset (ms) O-P Amp (mV) Norm O-P Amp Site1 Site2 Delta-0 (ms) Dist (cm) Vel (m/s) Norm Vel (m/s)  Left Peroneal Motor (Ext Dig Brev)  33C    Ankle    3.8 <6.0 3.1 >2.5 B Fib Ankle 9.5 40.0 42 >40  B Fib    13.3  2.2  Poplt B Fib 2.2 10.0 45 >40  Poplt    15.5  2.0         Right Peroneal Motor (Ext Dig Brev)  33C  Ankle    3.1 <6.0 2.6 >2.5 B Fib Ankle 10.2 41.0 40 >40  B Fib    13.3  2.0  Poplt B Fib 1.2 10.0 83 >40  Poplt    14.5  2.1         Left Peroneal TA Motor (Tib Ant)  33C  Fib Head    3.0 <4.5 4.0 >3 Poplit Fib Head 1.6 10.0 63 >40  Poplit    4.6  4.0         Right Peroneal TA Motor (Tib Ant)  33C  Fib Head    3.2 <4.5 4.0 >3 Poplit Fib Head 1.5 10.0 67 >40  Poplit    4.7  4.0         Left Tibial Motor (Abd Hall Brev)  33C  Ankle    5.5 <6.0 2.7 >4 Knee Ankle 11.1 44.0 40 >40  Knee    16.6  1.5         Right Tibial Motor (Abd Hall Brev)  33C  Ankle    4.3 <6.0 3.6 >4 Knee Ankle 10.9 44.0 40 >40  Knee  15.2  2.0          H Reflex Studies   NR H-Lat (ms) Lat Norm (ms) L-R H-Lat (ms)  Left Tibial (Gastroc)  33C     40.00 <35 0.00  Right Tibial (Gastroc)  33C     40.00 <35 0.00   EMG   Side Muscle Ins Act Fibs Psw Fasc Number Recrt Dur Dur. Amp Amp. Poly Poly. Comment  Right AntTibialis Nml Nml Nml Nml 1- Rapid Some 1+ Some 1+ Few 1+ N/A  Right Gastroc Nml Nml Nml Nml 1- Rapid Some 1+ Some 1+ Nml Nml N/A  Right Flex Dig Long Nml Nml Nml Nml 2- Rapid Some 1+ Some 1+ Nml Nml N/A  Right RectFemoris Nml Nml Nml Nml Nml Nml Nml Nml Nml Nml Nml Nml N/A  Right GluteusMed Nml Nml Nml Nml Nml Nml Nml Nml Nml Nml Nml Nml N/A  Right BicepsFemS Nml Nml Nml Nml 1- Rapid Few 1+ Few 1+ Nml Nml N/A  Left AntTibialis Nml Nml Nml Nml 1- Rapid Some 1+ Some 1+ Nml Nml N/A  Left Gastroc Nml Nml Nml Nml 1- Rapid Some 1+ Some 1+ Nml Nml N/A  Left RectFemoris Nml Nml Nml Nml Nml Nml Nml Nml Nml Nml Nml Nml N/A  Left GluteusMed Nml Nml Nml Nml Nml Nml Nml Nml Nml Nml Nml Nml N/A  Left BicepsFemS Nml Nml Nml Nml 1- Rapid Few 1+ Few 1+ Nml Nml N/A      Waveforms:

## 2018-05-30 ENCOUNTER — Encounter: Payer: Self-pay | Admitting: Neurology

## 2018-05-30 ENCOUNTER — Telehealth: Payer: Self-pay | Admitting: Internal Medicine

## 2018-05-30 MED ORDER — GABAPENTIN 300 MG PO CAPS: 300.0000 mg | ORAL_CAPSULE | Freq: Three times a day (TID) | ORAL | 5 refills | Status: DC

## 2018-05-30 NOTE — Telephone Encounter (Signed)
Patients states he is having nerve pain and coldness and hotness in his feet and neurology can not get him in until January.  Patient would like to know if he can be seen sooner at a different neurologist or what he can do between now and then.

## 2018-05-30 NOTE — Telephone Encounter (Signed)
Ok to increase the gabapentin to 300 mg three times per day - done erx

## 2018-05-30 NOTE — Telephone Encounter (Signed)
Pt has been informed and expressed understanding.  

## 2018-06-02 ENCOUNTER — Telehealth: Payer: Self-pay

## 2018-06-02 NOTE — Telephone Encounter (Signed)
-----   Message from Biagio Borg, MD sent at 05/29/2018 12:22 PM EDT ----- Left message on MyChart, pt to cont same tx except  The test results show that your current treatment is OK, except the test does show nerve damage to the legs, called peripheral neuropathy.  We will refer to Neurology for further consideration.  Shirron to please inform pt, I will do referral

## 2018-06-02 NOTE — Telephone Encounter (Signed)
Pt has been informed of results and expressed understanding.  °

## 2018-06-06 ENCOUNTER — Other Ambulatory Visit: Payer: Self-pay | Admitting: Internal Medicine

## 2018-06-06 MED ORDER — FUROSEMIDE 40 MG PO TABS: ORAL_TABLET | ORAL | 5 refills | Status: DC

## 2018-06-06 NOTE — Telephone Encounter (Signed)
Copied from [place] [id number]. Topic: Quick Communication - Rx Refill/Question >> Jun 06, 2018 11:26 AM Alfredia Ferguson R wrote: Medication: furosemide (LASIX) 40 MG tablet  Has the patient contacted their pharmacy? Yes  Preferred Pharmacy (with phone number or street name): [facility] [phone] - [place], [place] - [address] [phone] (Phone) [phone] (Fax)    Agent: Please be advised that RX refills may take up to 3 business days. We ask that you follow-up with your pharmacy.

## 2018-06-24 ENCOUNTER — Other Ambulatory Visit: Payer: Self-pay | Admitting: Internal Medicine

## 2018-06-24 ENCOUNTER — Other Ambulatory Visit: Payer: Self-pay

## 2018-06-24 NOTE — Patient Outreach (Signed)
[facility]) Care Management  06/24/2018  Troy Palmer [birth year] [id number]   Follow up call to Troy Palmer to determine if he has heard anything from [employer] [facility] regarding the application that was submitted on 05/26/18.  He denied hearing anything so BSW provided him with contact information to call and follow up.  BSW reminded him of [facility] that is scheduled to occur in [facility] on 11/07/18 and 11/08/18.  Per Troy Palmer, he contacted them and was told they do not have any more space available.  BSW called and left a message to confirm this as it was understood that it is first come first serve on the days of the clinics.  BSW will follow with Troy Palmer again next week regarding these resources.  Troy Palmer, BSW Social Worker [phone]

## 2018-07-01 ENCOUNTER — Other Ambulatory Visit: Payer: Self-pay

## 2018-07-01 ENCOUNTER — Ambulatory Visit: Payer: Self-pay

## 2018-07-01 NOTE — Patient Outreach (Signed)
[facility]) Care Management  07/01/2018  KENNIETH PLOTTS [birth year] [id number]   BSW received return call from Eugene Garnet with [facility].  She confirmed that a clinic is scheduled to occur in [facility] on 11/07/18 and 11/08/18.  Patient's are seen on a first come, first serve basis and can receive cleanings, fillings, and extractions.  Spaces are not reserved in advance for this event.  BSW attempted to contact Mr. Veselka to provide this information as he reported during our last call that all spaces were filled for this clinic.  BSW left voicemail message.  Will attempt to reach him again within four business days.  Ronn Melena, BSW Social Worker [phone]

## 2018-07-03 ENCOUNTER — Other Ambulatory Visit: Payer: Self-pay

## 2018-07-03 NOTE — Patient Outreach (Signed)
[facility]) Care Management  07/03/2018  Troy Palmer 02-Oct-1945 [id number]  Call to [name] to inform him that [facility] is an available option for free dental work.  The clinic is being held on 10/09/18 and 11/08/18 and is first come first serve.  [name] was encouraged to call with any additional questions or concerns.  BSW is closing case at this time.   Ronn Melena, BSW Social Worker [phone]

## 2018-07-08 ENCOUNTER — Encounter: Payer: Self-pay | Admitting: Internal Medicine

## 2018-07-08 ENCOUNTER — Ambulatory Visit (INDEPENDENT_AMBULATORY_CARE_PROVIDER_SITE_OTHER): Payer: PPO | Admitting: Internal Medicine

## 2018-07-08 VITALS — BP 124/82 | HR 66 | Temp 98.3°F | Ht 69.0 in | Wt 290.0 lb

## 2018-07-08 DIAGNOSIS — R062 Wheezing: Secondary | ICD-10-CM | POA: Diagnosis not present

## 2018-07-08 DIAGNOSIS — R7302 Impaired glucose tolerance (oral): Secondary | ICD-10-CM

## 2018-07-08 DIAGNOSIS — R05 Cough: Secondary | ICD-10-CM | POA: Diagnosis not present

## 2018-07-08 DIAGNOSIS — R059 Cough, unspecified: Secondary | ICD-10-CM

## 2018-07-08 DIAGNOSIS — I10 Essential (primary) hypertension: Secondary | ICD-10-CM

## 2018-07-08 MED ORDER — PREDNISONE 10 MG PO TABS: ORAL_TABLET | ORAL | 0 refills | Status: DC

## 2018-07-08 MED ORDER — AZITHROMYCIN 250 MG PO TABS: ORAL_TABLET | ORAL | 1 refills | Status: DC

## 2018-07-08 MED ORDER — HYDROCODONE-HOMATROPINE 5-1.5 MG/5ML PO SYRP: 5.0000 mL | ORAL_SOLUTION | Freq: Four times a day (QID) | ORAL | 0 refills | Status: AC | PRN

## 2018-07-08 NOTE — Patient Instructions (Signed)
Please take all new medication as prescribed - the antibiotic, cough medicine as needed, and prednisone  Please continue all other medications as before, and refills have been done if requested.  Please have the pharmacy call with any other refills you may need.  Please keep your appointments with your specialists as you may have planned

## 2018-07-08 NOTE — Progress Notes (Signed)
Subjective:    Patient ID: Troy Palmer, male    DOB: [birth year], 72 y.o.   MRN: [id number]  HPI  Here with acute onset mild to mod 2-3 days ST, HA, general weakness and malaise, with prod cough greenish sputum, but Pt denies chest pain, increased sob or doe, wheezing, orthopnea, PND, increased LE swelling, palpitations, dizziness or syncope, except for mild wheezing and sob since last PM.  Pt denies new neurological symptoms such as new headache, or facial or extremity weakness or numbness   Pt denies polydipsia, polyuria Past Medical History:  Diagnosis Date  . ALLERGIC RHINITIS 04/09/2007  . ASTHMA 12/04/2007  . BACK PAIN 09/16/2009  . BENIGN PROSTATIC HYPERTROPHY 04/09/2007  . CEREBROVASCULAR ACCIDENT, HX OF 07/17/2010  . COLONIC POLYPS, HX OF 12/04/2007  . CONSTIPATION 09/25/2010  . Degeneration of cervical intervertebral disc 03/28/2007  . DEPRESSION 12/04/2007  . DIVERTICULOSIS, COLON 12/04/2007  . DYSPHAGIA UNSPECIFIED 03/16/2008  . ERECTILE DYSFUNCTION 04/09/2007  . ESOPHAGEAL STRICTURE 05/26/2008  . GERD 12/04/2007  . HEMORRHOIDS, RECURRENT 02/11/2008  . HYPERLIPIDEMIA 12/04/2007  . HYPERTENSION 03/28/2007  . LEG PAIN, BILATERAL 01/25/2010  . LOW BACK PAIN 04/09/2007  . LUNG NODULE 12/04/2007  . Osteoarth NOS-Unspec 03/28/2007  . Other dysphagia 11/21/2009  . Peripheral neuropathy 05/29/2018  . POLYARTHRALGIA 07/13/2008  . Polymyalgia rheumatica ([place]) 07/26/2008  . PVD WITH CLAUDICATION 02/10/2010  . RENAL INSUFFICIENCY 03/15/2010  . SLEEP APNEA, OBSTRUCTIVE, MODERATE 04/09/2007  . TB SKIN TEST, POSITIVE 03/28/2007   Past Surgical History:  Procedure Laterality Date  . ROTATOR CUFF REPAIR     Left  . TOTAL KNEE ARTHROPLASTY     x 2    reports that he has never smoked. He has never used smokeless tobacco. He reports that he does not drink alcohol or use drugs. family history includes Asthma in his other; Heart disease in his father; Hypertension in his brother; Lupus in his  brother. Allergies  Allergen Reactions  . Ace Inhibitors Swelling    Angioedema throat  . Augmentin [Amoxicillin-Pot Clavulanate] Other (See Comments)    Marked weakness with po med x 3 doses  . Doxycycline     Some GI upset  . Sulfa Antibiotics Hives    And facial angioedema   Current Outpatient Medications on File Prior to Visit  Medication Sig Dispense Refill  . acetaminophen (TYLENOL 8 HOUR) 650 MG CR tablet Take 650 mg by mouth every 6 (six) hours as needed (pain/headache).    Marland Kitchen albuterol (PROVENTIL HFA;VENTOLIN HFA) 108 (90 Base) MCG/ACT inhaler Inhale 2 puffs into the lungs every 6 (six) hours as needed for wheezing or shortness of breath. 3 Inhaler 3  . allopurinol (ZYLOPRIM) 100 MG tablet Take 1 tablet (100 mg total) by mouth daily. 90 tablet 0  . amLODipine (NORVASC) 5 MG tablet Take 1 tablet (5 mg total) by mouth daily. 30 tablet 5  . atorvastatin (LIPITOR) 40 MG tablet TAKE 1 TABLET(40 MG) BY MOUTH DAILY 90 tablet 1  . benzonatate (TESSALON) 200 MG capsule Take 1 capsule (200 mg total) by mouth 3 (three) times daily as needed for cough. 45 capsule 3  . BYSTOLIC 10 MG tablet TAKE 2 TABLETS(20 MG) BY MOUTH DAILY 60 tablet 0  . colchicine 0.6 MG tablet Take 1 tablet (0.6 mg total) by mouth 2 (two) times daily. 60 tablet 5  . furosemide (LASIX) 40 MG tablet take 1 tablet by mouth every morning AND 1 IN THE EVENING AS NEEDED FOR SWELLING ONLY  60 tablet 5  . gabapentin (NEURONTIN) 300 MG capsule Take 1 capsule (300 mg total) by mouth 3 (three) times daily. 90 capsule 5  . pantoprazole (PROTONIX) 40 MG tablet Take 1 tablet (40 mg total) by mouth daily. 90 tablet 3  . tamsulosin (FLOMAX) 0.4 MG CAPS capsule 1 tab by mouth twice per day (Patient taking differently: Take 0.4 mg by mouth daily after breakfast. Pt says he takes 2 tablets at in the morning.) 180 capsule 3   No current facility-administered medications on file prior to visit.    Review of Systems  Constitutional: Negative  for other unusual diaphoresis or sweats HENT: Negative for ear discharge or swelling Eyes: Negative for other worsening visual disturbances Respiratory: Negative for stridor or other swelling  Gastrointestinal: Negative for worsening distension or other blood Genitourinary: Negative for retention or other urinary change Musculoskeletal: Negative for other MSK pain or swelling Skin: Negative for color change or other new lesions Neurological: Negative for worsening tremors and other numbness  Psychiatric/Behavioral: Negative for worsening agitation or other fatigue All other system neg per pt    Objective:   Physical Exam BP 124/82   Pulse 66   Temp 98.3 F (36.8 C) (Oral)   Ht 5\' 9"  (1.753 m)   Wt 290 lb (131.5 kg)   SpO2 96%   BMI 42.83 kg/m  VS noted, mild ill Constitutional: Pt appears in NAD HENT: Head: NCAT.  Right Ear: External ear normal.  Left Ear: External ear normal.  Bilat tm's with mild erythema.  Max sinus areas non tender.  Pharynx with mild erythema, no exudate Eyes: . Pupils are equal, round, and reactive to light. Conjunctivae and EOM are normal Nose: without d/c or deformity Neck: Neck supple. Gross normal ROM Cardiovascular: Normal rate and regular rhythm.   Pulmonary/Chest: Effort normal and breath sounds without rales or wheezing.  Abd:  Soft, NT, ND, + BS, no organomegaly Neurological: Pt is alert. At baseline orientation, motor grossly intact Skin: Skin is warm. No rashes, other new lesions, no LE edema Psychiatric: Pt behavior is normal without agitation  No other exam findings Lab Results  Component Value Date   WBC 4.8 03/25/2018   HGB 12.3 (L) 03/25/2018   HCT 37.6 (L) 03/25/2018   PLT 244.0 03/25/2018   GLUCOSE 119 (H) 02/19/2018   CHOL 147 08/16/2017   TRIG 69.0 08/16/2017   HDL 40.00 08/16/2017   LDLDIRECT 156.9 10/10/2010   LDLCALC 93 08/16/2017   ALT 35 02/09/2018   AST 28 02/09/2018   NA 142 02/19/2018   K 3.9 02/19/2018   CL 109  02/19/2018   CREATININE 2.24 (H) 02/19/2018   BUN 21 02/19/2018   CO2 23 02/19/2018   TSH 2.09 08/16/2017   PSA 0.98 08/16/2017   HGBA1C 6.5 (H) 02/09/2018      Assessment & Plan:

## 2018-07-10 NOTE — Assessment & Plan Note (Signed)
/  Mild to mod, for predpac asd,  to f/u any worsening symptoms or concerns 

## 2018-07-10 NOTE — Assessment & Plan Note (Signed)
Mild to mod, c/w bronchitis vs pna, declines cxr, for antibx course, cough med prn,  to f/u any worsening symptoms or concerns 

## 2018-07-10 NOTE — Assessment & Plan Note (Signed)
stable overall by history and exam, recent data reviewed with pt, and pt to continue medical treatment as before,  to f/u any worsening symptoms or concerns  

## 2018-07-15 ENCOUNTER — Other Ambulatory Visit: Payer: Self-pay | Admitting: Internal Medicine

## 2018-07-25 ENCOUNTER — Encounter: Payer: Self-pay | Admitting: Internal Medicine

## 2018-07-25 ENCOUNTER — Other Ambulatory Visit (INDEPENDENT_AMBULATORY_CARE_PROVIDER_SITE_OTHER): Payer: PPO

## 2018-07-25 ENCOUNTER — Ambulatory Visit (INDEPENDENT_AMBULATORY_CARE_PROVIDER_SITE_OTHER): Payer: PPO | Admitting: Internal Medicine

## 2018-07-25 DIAGNOSIS — R7302 Impaired glucose tolerance (oral): Secondary | ICD-10-CM

## 2018-07-25 DIAGNOSIS — I10 Essential (primary) hypertension: Secondary | ICD-10-CM

## 2018-07-25 DIAGNOSIS — R109 Unspecified abdominal pain: Secondary | ICD-10-CM

## 2018-07-25 LAB — BASIC METABOLIC PANEL
BUN: 36 mg/dL — ABNORMAL HIGH (ref 6–23)
CHLORIDE: 106 meq/L (ref 96–112)
CO2: 26 mEq/L (ref 19–32)
Calcium: 9.2 mg/dL (ref 8.4–10.5)
Creatinine, Ser: 1.91 mg/dL — ABNORMAL HIGH (ref 0.40–1.50)
GFR: 44.76 mL/min — ABNORMAL LOW (ref >60.00)
Glucose, Bld: 94 mg/dL (ref 70–99)
Potassium: 4.5 mEq/L (ref 3.5–5.1)
Sodium: 141 mEq/L (ref 135–145)

## 2018-07-25 LAB — CBC WITH DIFFERENTIAL/PLATELET
Basophils Absolute: 0 10*3/uL (ref 0.0–0.1)
Basophils Relative: 0.7 % (ref 0.0–3.0)
Eosinophils Absolute: 0.2 10*3/uL (ref 0.0–0.7)
Eosinophils Relative: 4.4 % (ref 0.0–5.0)
HCT: 40.8 % (ref 39.0–52.0)
Hemoglobin: 13.1 g/dL (ref 13.0–17.0)
LYMPHS ABS: 1.3 10*3/uL (ref 0.7–4.0)
Lymphocytes Relative: 28.1 % (ref 12.0–46.0)
MCHC: 32.3 g/dL (ref 30.0–36.0)
MCV: 82 fl (ref 78.0–100.0)
Monocytes Absolute: 0.8 10*3/uL (ref 0.1–1.0)
Monocytes Relative: 15.8 % — ABNORMAL HIGH (ref 3.0–12.0)
Neutro Abs: 2.4 10*3/uL (ref 1.4–7.7)
Neutrophils Relative %: 51 % (ref 43.0–77.0)
Platelets: 222 10*3/uL (ref 150.0–400.0)
RBC: 4.97 Mil/uL (ref 4.22–5.81)
RDW: 15 % (ref 11.5–15.5)
WBC: 4.8 10*3/uL (ref 4.0–10.5)

## 2018-07-25 LAB — HEPATIC FUNCTION PANEL
ALBUMIN: 3.6 g/dL (ref 3.5–5.2)
ALT: 12 U/L (ref 0–53)
AST: 14 U/L (ref 0–37)
Alkaline Phosphatase: 119 U/L — ABNORMAL HIGH (ref 39–117)
BILIRUBIN DIRECT: 0 mg/dL (ref 0.0–0.3)
BILIRUBIN TOTAL: 0.5 mg/dL (ref 0.2–1.2)
Total Protein: 7 g/dL (ref 6.0–8.3)

## 2018-07-25 LAB — POCT URINALYSIS DIPSTICK
Bilirubin, UA: NEGATIVE
Blood, UA: NEGATIVE
Glucose, UA: NEGATIVE
Ketones, UA: NEGATIVE
Leukocytes, UA: NEGATIVE
NITRITE UA: NEGATIVE
PH UA: 6 (ref 5.0–8.0)
PROTEIN UA: NEGATIVE
Spec Grav, UA: 1.015 (ref 1.010–1.025)
UROBILINOGEN UA: 0.2 U/dL

## 2018-07-25 LAB — LIPASE: Lipase: 21 U/L (ref 11.0–59.0)

## 2018-07-25 MED ORDER — HYDROCODONE-ACETAMINOPHEN 7.5-325 MG PO TABS: 1.0000 | ORAL_TABLET | Freq: Four times a day (QID) | ORAL | 0 refills | Status: DC | PRN

## 2018-07-25 NOTE — Patient Instructions (Signed)
Please take all new medication as prescribed - the pain medication as needed  Please continue all other medications as before, and refills have been done if requested.  Please have the pharmacy call with any other refills you may need.  Please keep your appointments with your specialists as you may have planned  You will be contacted regarding the referral for: CT scan - to see [facility] now  Please go to the LAB in the Basement (turn left off the elevator) for the tests to be done today  You will be contacted by phone if any changes need to be made immediately.  Otherwise, you will receive a letter about your results with an explanation, but please check with MyChart first.  Please remember to sign up for MyChart if you have not done so, as this will be important to you in the future with finding out test results, communicating by private email, and scheduling acute appointments online when needed.

## 2018-07-25 NOTE — Progress Notes (Signed)
Subjective:    Patient ID: Troy Palmer, male    DOB: 12/26/1945, 72 y.o.   MRN: [id number]  HPI  Here with c/o new onset 4 days right flank pain severe intermittent deep (nontender) with radiation around the right side ending in the right groin/testicle area.  Denies urinary symptoms such as dysuria, frequency, urgency, hematuria or n/v, fever, chills.  No prior hx of renal stone or family hx.  Pt denies chest pain, increased sob or doe, wheezing, orthopnea, PND, increased LE swelling, palpitations, dizziness or syncope.  Pt denies new neurological symptoms such as new headache, or facial or extremity weakness or numbness   Pt denies polydipsia, polyuria Past Medical History:  Diagnosis Date  . ALLERGIC RHINITIS 04/09/2007  . ASTHMA 12/04/2007  . BACK PAIN 09/16/2009  . BENIGN PROSTATIC HYPERTROPHY 04/09/2007  . CEREBROVASCULAR ACCIDENT, HX OF 07/17/2010  . COLONIC POLYPS, HX OF 12/04/2007  . CONSTIPATION 09/25/2010  . Degeneration of cervical intervertebral disc 03/28/2007  . DEPRESSION 12/04/2007  . DIVERTICULOSIS, COLON 12/04/2007  . DYSPHAGIA UNSPECIFIED 03/16/2008  . ERECTILE DYSFUNCTION 04/09/2007  . ESOPHAGEAL STRICTURE 05/26/2008  . GERD 12/04/2007  . HEMORRHOIDS, RECURRENT 02/11/2008  . HYPERLIPIDEMIA 12/04/2007  . HYPERTENSION 03/28/2007  . LEG PAIN, BILATERAL 01/25/2010  . LOW BACK PAIN 04/09/2007  . LUNG NODULE 12/04/2007  . Osteoarth NOS-Unspec 03/28/2007  . Other dysphagia 11/21/2009  . Peripheral neuropathy 05/29/2018  . POLYARTHRALGIA 07/13/2008  . Polymyalgia rheumatica ([place]) 07/26/2008  . PVD WITH CLAUDICATION 02/10/2010  . RENAL INSUFFICIENCY 03/15/2010  . SLEEP APNEA, OBSTRUCTIVE, MODERATE 04/09/2007  . TB SKIN TEST, POSITIVE 03/28/2007   Past Surgical History:  Procedure Laterality Date  . ROTATOR CUFF REPAIR     Left  . TOTAL KNEE ARTHROPLASTY     x 2    reports that he has never smoked. He has never used smokeless tobacco. He reports that he does not drink alcohol or use  drugs. family history includes Asthma in an other family member; Heart disease in his father; Hypertension in his brother; Lupus in his brother. Allergies  Allergen Reactions  . Ace Inhibitors Swelling    Angioedema throat  . Augmentin [Amoxicillin-Pot Clavulanate] Other (See Comments)    Marked weakness with po med x 3 doses  . Doxycycline     Some GI upset  . Sulfa Antibiotics Hives    And facial angioedema   Current Outpatient Medications on File Prior to Visit  Medication Sig Dispense Refill  . acetaminophen (TYLENOL 8 HOUR) 650 MG CR tablet Take 650 mg by mouth every 6 (six) hours as needed (pain/headache).    Marland Kitchen albuterol (PROVENTIL HFA;VENTOLIN HFA) 108 (90 Base) MCG/ACT inhaler Inhale 2 puffs into the lungs every 6 (six) hours as needed for wheezing or shortness of breath. 3 Inhaler 3  . allopurinol (ZYLOPRIM) 100 MG tablet Take 1 tablet (100 mg total) by mouth daily. 90 tablet 0  . amLODipine (NORVASC) 5 MG tablet Take 1 tablet (5 mg total) by mouth daily. 30 tablet 5  . atorvastatin (LIPITOR) 40 MG tablet TAKE 1 TABLET(40 MG) BY MOUTH DAILY 90 tablet 1  . azithromycin (ZITHROMAX Z-PAK) 250 MG tablet 2 tab by mouth day 1, then 1 per day 6 tablet 1  . benzonatate (TESSALON) 200 MG capsule Take 1 capsule (200 mg total) by mouth 3 (three) times daily as needed for cough. 45 capsule 3  . BYSTOLIC 10 MG tablet TAKE 2 TABLETS(20 MG) BY MOUTH DAILY 60 tablet 0  .  colchicine 0.6 MG tablet Take 1 tablet (0.6 mg total) by mouth 2 (two) times daily. 60 tablet 5  . furosemide (LASIX) 40 MG tablet take 1 tablet by mouth every morning AND 1 IN THE EVENING AS NEEDED FOR SWELLING ONLY 60 tablet 5  . gabapentin (NEURONTIN) 300 MG capsule Take 1 capsule (300 mg total) by mouth 3 (three) times daily. 90 capsule 5  . pantoprazole (PROTONIX) 40 MG tablet Take 1 tablet (40 mg total) by mouth daily. 90 tablet 3  . predniSONE (DELTASONE) 10 MG tablet 2 tabs by mouth daily x 6 days 12 tablet 0  .  tamsulosin (FLOMAX) 0.4 MG CAPS capsule TAKE 1 CAPSULE BY MOUTH TWICE A DAY 180 capsule 0   No current facility-administered medications on file prior to visit.    Review of Systems  Constitutional: Negative for other unusual diaphoresis or sweats HENT: Negative for ear discharge or swelling Eyes: Negative for other worsening visual disturbances Respiratory: Negative for stridor or other swelling  Gastrointestinal: Negative for worsening distension or other blood Genitourinary: Negative for retention or other urinary change Musculoskeletal: Negative for other MSK pain or swelling Skin: Negative for color change or other new lesions Neurological: Negative for worsening tremors and other numbness  Psychiatric/Behavioral: Negative for worsening agitation or other fatigue All other system neg per pt    Objective:   Physical Exam BP 138/84   Pulse 64   Temp 97.6 F (36.4 C) (Oral)   Ht 5\' 9"  (1.753 m)   Wt 293 lb (132.9 kg)   SpO2 95%   BMI 43.27 kg/m  VS noted,  Constitutional: Pt appears in NAD HENT: Head: NCAT.  Right Ear: External ear normal.  Left Ear: External ear normal.  Eyes: . Pupils are equal, round, and reactive to light. Conjunctivae and EOM are normal Nose: without d/c or deformity Neck: Neck supple. Gross normal ROM Cardiovascular: Normal rate and regular rhythm.   Pulmonary/Chest: Effort normal and breath sounds without rales or wheezing.  Abd:  Soft, NT, ND, + BS, no organomegaly Neurological: Pt is alert. At baseline orientation, motor grossly intact Skin: Skin is warm. No rashes, other new lesions, no LE edema Psychiatric: Pt behavior is normal without agitation  No other exam findings Lab Results  Component Value Date   WBC 4.8 07/25/2018   HGB 13.1 07/25/2018   HCT 40.8 07/25/2018   PLT 222.0 07/25/2018   GLUCOSE 94 07/25/2018   CHOL 147 08/16/2017   TRIG 69.0 08/16/2017   HDL 40.00 08/16/2017   LDLDIRECT 156.9 10/10/2010   LDLCALC 93 08/16/2017     ALT 12 07/25/2018   AST 14 07/25/2018   NA 141 07/25/2018   K 4.5 07/25/2018   CL 106 07/25/2018   CREATININE 1.91 (H) 07/25/2018   BUN 36 (H) 07/25/2018   CO2 26 07/25/2018   TSH 2.09 08/16/2017   PSA 0.98 08/16/2017   HGBA1C 6.5 (H) 02/09/2018     POCT Urinalysis Dipstick   Ref Range & Units 11:08 (07/25/18) 2mo ago (02/09/18) 3mo ago (08/16/17) 61yr ago (11/16/15) 21yr ago (01/20/14) 61yr ago (10/15/13) 28yr ago (10/14/12)  Color, UA  yellow         Clarity, UA  clear         Glucose, UA Negative Negative         Bilirubin, UA  neg         Ketones, UA  neg         Spec Grav, UA  1.010 - 1.025 1.015         Blood, UA  neg         pH, UA 5.0 - 8.0 6.0         Protein, UA Negative Negative         Urobilinogen, UA 0.2 or 1.0 E.U./dL 0.2   0.2 R 0.2 R 0.2 R 0.2 R 0.2 R  Nitrite, UA  neg         Leukocytes, UA Negative Negative                   Assessment & Plan:

## 2018-07-25 NOTE — Assessment & Plan Note (Addendum)
Right flank to abd and groin, UDip neg for blood, but suspect first time right renal stone with ureteral colic, for CT abd/pelv stone protocol, state labs and vicodin 7.5's prn, consider urology pending results  Note:  Total time for pt hx, exam, review of record with pt in the room, determination of diagnoses and plan for further eval and tx is > 40 min, with over 50% spent in coordination and counseling of patient including the differential dx, tx, further evaluation and other management of abd pain, hyperglycemia, and HTN

## 2018-07-25 NOTE — Assessment & Plan Note (Signed)
stable overall by history and exam, recent data reviewed with pt, and pt to continue medical treatment as before,  to f/u any worsening symptoms or concerns  

## 2018-07-26 ENCOUNTER — Other Ambulatory Visit: Payer: Self-pay | Admitting: Internal Medicine

## 2018-07-28 ENCOUNTER — Encounter: Payer: Self-pay | Admitting: Internal Medicine

## 2018-07-28 ENCOUNTER — Ambulatory Visit
Admission: RE | Admit: 2018-07-28 | Discharge: 2018-07-28 | Disposition: A | Discharge status: Home / Self Care | Payer: PPO | Source: Ambulatory Visit | Attending: Internal Medicine | Admitting: Internal Medicine

## 2018-07-28 DIAGNOSIS — R319 Hematuria, unspecified: Secondary | ICD-10-CM | POA: Diagnosis not present

## 2018-07-28 DIAGNOSIS — R109 Unspecified abdominal pain: Secondary | ICD-10-CM

## 2018-07-29 ENCOUNTER — Telehealth: Payer: Self-pay | Admitting: Internal Medicine

## 2018-07-29 DIAGNOSIS — R319 Hematuria, unspecified: Secondary | ICD-10-CM

## 2018-07-29 DIAGNOSIS — K921 Melena: Secondary | ICD-10-CM

## 2018-07-29 NOTE — Telephone Encounter (Signed)
We've had no lab evidence for blood in the urine so far  Please come to the lab to have the urine checked again  We can refer to GI for the blood though - this is done

## 2018-07-29 NOTE — Telephone Encounter (Signed)
Dr John please advise.  

## 2018-07-29 NOTE — Telephone Encounter (Signed)
Letter has been sent I believe, apparently has not been received  CT scan did not show any explanation for the pain, which looking back means this was most likely muscular pain.  I can send a muscle relaxer if he likes, or he could see Sports Medicine in this office for the back pain, which could also possibly be related to a spine problem

## 2018-07-29 NOTE — Addendum Note (Signed)
Addended by: Biagio Borg on: 07/29/2018 05:30 PM   Modules accepted: Orders

## 2018-07-29 NOTE — Telephone Encounter (Addendum)
Pt has been informed and expressed understanding. He stated that he has noticed a litle bit of blood in his urine and bowel movements. He stated he only has one kidney and he is concerned about it feels like something is being missed. I informed him that I would speak with PCP and see what he suggest.

## 2018-07-29 NOTE — Telephone Encounter (Signed)
Copied from [place] [phone]. Topic: General - Inquiry >> Jul 29, 2018 12:00 PM Alanda Slim E wrote: Reason for CRM: Pt called in to get the results of the Pelvic scan he had done on 12.16.2019.  Pt states he is still in a lot of pain. Derrek Monaco advise

## 2018-07-30 NOTE — Telephone Encounter (Signed)
Pt would like to move forward with the referral to GI. However he would like for it to be after the first of the year due to traveling plans for the holidays.

## 2018-07-30 NOTE — Telephone Encounter (Signed)
Referral has already been done, and I dont have any control over the scheduling, sorry

## 2018-08-11 ENCOUNTER — Other Ambulatory Visit: Payer: Self-pay | Admitting: Internal Medicine

## 2018-08-16 ENCOUNTER — Other Ambulatory Visit: Payer: Self-pay | Admitting: Internal Medicine

## 2018-08-19 ENCOUNTER — Encounter: Payer: Self-pay | Admitting: Internal Medicine

## 2018-08-19 ENCOUNTER — Other Ambulatory Visit (INDEPENDENT_AMBULATORY_CARE_PROVIDER_SITE_OTHER): Payer: Medicare Other

## 2018-08-19 ENCOUNTER — Ambulatory Visit (INDEPENDENT_AMBULATORY_CARE_PROVIDER_SITE_OTHER): Payer: Medicare Other | Admitting: Internal Medicine

## 2018-08-19 VITALS — BP 116/64 | HR 50 | Temp 97.6°F | Ht 69.0 in | Wt 293.0 lb

## 2018-08-19 DIAGNOSIS — Z0001 Encounter for general adult medical examination with abnormal findings: Secondary | ICD-10-CM

## 2018-08-19 DIAGNOSIS — R7302 Impaired glucose tolerance (oral): Secondary | ICD-10-CM | POA: Diagnosis not present

## 2018-08-19 DIAGNOSIS — J069 Acute upper respiratory infection, unspecified: Secondary | ICD-10-CM | POA: Insufficient documentation

## 2018-08-19 DIAGNOSIS — R296 Repeated falls: Secondary | ICD-10-CM

## 2018-08-19 DIAGNOSIS — M353 Polymyalgia rheumatica: Secondary | ICD-10-CM

## 2018-08-19 DIAGNOSIS — I739 Peripheral vascular disease, unspecified: Secondary | ICD-10-CM | POA: Diagnosis not present

## 2018-08-19 LAB — BASIC METABOLIC PANEL
BUN: 29 mg/dL — ABNORMAL HIGH (ref 6–23)
CO2: 32 mEq/L (ref 19–32)
CREATININE: 1.97 mg/dL — AB (ref 0.40–1.50)
Calcium: 9.5 mg/dL (ref 8.4–10.5)
Chloride: 106 mEq/L (ref 96–112)
GFR: 43.18 mL/min — ABNORMAL LOW (ref >60.00)
Glucose, Bld: 75 mg/dL (ref 70–99)
POTASSIUM: 5.1 meq/L (ref 3.5–5.1)
Sodium: 142 mEq/L (ref 135–145)

## 2018-08-19 LAB — CBC WITH DIFFERENTIAL/PLATELET
Basophils Absolute: 0 10*3/uL (ref 0.0–0.1)
Basophils Relative: 0.9 % (ref 0.0–3.0)
Eosinophils Absolute: 0.3 10*3/uL (ref 0.0–0.7)
Eosinophils Relative: 5 % (ref 0.0–5.0)
HCT: 40.6 % (ref 39.0–52.0)
HEMOGLOBIN: 13 g/dL (ref 13.0–17.0)
Lymphocytes Relative: 27.4 % (ref 12.0–46.0)
Lymphs Abs: 1.4 10*3/uL (ref 0.7–4.0)
MCHC: 32.1 g/dL (ref 30.0–36.0)
MCV: 83.1 fl (ref 78.0–100.0)
Monocytes Absolute: 0.8 10*3/uL (ref 0.1–1.0)
Monocytes Relative: 15.2 % — ABNORMAL HIGH (ref 3.0–12.0)
Neutro Abs: 2.6 10*3/uL (ref 1.4–7.7)
Neutrophils Relative %: 51.5 % (ref 43.0–77.0)
Platelets: 208 10*3/uL (ref 150.0–400.0)
RBC: 4.88 Mil/uL (ref 4.22–5.81)
RDW: 15 % (ref 11.5–15.5)
WBC: 5.1 10*3/uL (ref 4.0–10.5)

## 2018-08-19 LAB — HEPATIC FUNCTION PANEL
ALT: 14 U/L (ref 0–53)
AST: 15 U/L (ref 0–37)
Albumin: 3.6 g/dL (ref 3.5–5.2)
Alkaline Phosphatase: 117 U/L (ref 39–117)
Bilirubin, Direct: 0.1 mg/dL (ref 0.0–0.3)
Total Bilirubin: 0.6 mg/dL (ref 0.2–1.2)
Total Protein: 6.4 g/dL (ref 6.0–8.3)

## 2018-08-19 LAB — LIPID PANEL
CHOLESTEROL: 175 mg/dL (ref 0–200)
HDL: 52.6 mg/dL (ref >39.00)
LDL CALC: 110 mg/dL — AB (ref 0–99)
NonHDL: 122.75
Total CHOL/HDL Ratio: 3
Triglycerides: 65 mg/dL (ref 0.0–149.0)
VLDL: 13 mg/dL (ref 0.0–40.0)

## 2018-08-19 LAB — HEMOGLOBIN A1C: Hgb A1c MFr Bld: 5.8 % (ref 4.6–6.5)

## 2018-08-19 LAB — PSA: PSA: 1.11 ng/mL (ref 0.10–4.00)

## 2018-08-19 LAB — TSH: TSH: 1.97 u[IU]/mL (ref 0.35–4.50)

## 2018-08-19 MED ORDER — PREDNISONE 10 MG PO TABS: ORAL_TABLET | ORAL | 0 refills | Status: DC

## 2018-08-19 MED ORDER — AZITHROMYCIN 250 MG PO TABS: ORAL_TABLET | ORAL | 1 refills | Status: DC

## 2018-08-19 NOTE — Assessment & Plan Note (Signed)
stable overall by history and exam, recent data reviewed with pt, and pt to continue medical treatment as before,  to f/u any worsening symptoms or concerns  

## 2018-08-19 NOTE — Patient Instructions (Signed)
Please take all new medication as prescribed - the antibiotic and prednisone  You are given the letter today for the [place].    Please continue all other medications as before, and refills have been done if requested.  Please have the pharmacy call with any other refills you may need.  Please continue your efforts at being more active, low cholesterol diet, and weight control.  You are otherwise up to date with prevention measures today.  Please keep your appointments with your specialists as you may have planned  Please go to the LAB in the Basement (turn left off the elevator) for the tests to be done today  You will be contacted by phone if any changes need to be made immediately.  Otherwise, you will receive a letter about your results with an explanation, but please check with MyChart first.  Please remember to sign up for MyChart if you have not done so, as this will be important to you in the future with finding out test results, communicating by private email, and scheduling acute appointments online when needed.  Please return in 6 months, or sooner if needed

## 2018-08-19 NOTE — Progress Notes (Signed)
Subjective:    Patient ID: Troy Palmer, male    DOB: [birth year], 73 y.o.   MRN: [id number]  HPI  Here for wellness and f/u;  Overall doing ok;  Pt denies Chest pain, worsening SOB, DOE, wheezing, orthopnea, PND, worsening LE edema, palpitations, dizziness or syncope.  Pt denies neurological change such as new headache, facial or extremity weakness.  Pt denies polydipsia, polyuria, or low sugar symptoms. Pt states overall good compliance with treatment and medications, good tolerability, and has been trying to follow appropriate diet.  Pt denies worsening depressive symptoms, suicidal ideation or panic. No fever, night sweats, wt loss, loss of appetite, or other constitutional symptoms.  Pt states good ability with ADL's, has low fall risk, home safety reviewed and adequate, no other significant changes in hearing or vision, and only occasionally active with exercise.  Also,  Here with 2-3 days acute onset fever, severe ST pain, pressure, headache, general weakness and malaise, but pt denies chest pain, wheezing, increased sob or doe, orthopnea, PND, increased LE swelling, palpitations, dizziness or syncope.  Does have several wks ongoing nasal allergy symptoms with clearish congestion, itch and sneezing, without fever, pain, ST, cough, swelling or wheezing.  Also needs letter to landlord to get out of lease early as he has great difficulty with going up the stairs, due to knee and pain, and has already fallen twice Past Medical History:  Diagnosis Date  . ALLERGIC RHINITIS 04/09/2007  . ASTHMA 12/04/2007  . BACK PAIN 09/16/2009  . BENIGN PROSTATIC HYPERTROPHY 04/09/2007  . CEREBROVASCULAR ACCIDENT, HX OF 07/17/2010  . COLONIC POLYPS, HX OF 12/04/2007  . CONSTIPATION 09/25/2010  . Degeneration of cervical intervertebral disc 03/28/2007  . DEPRESSION 12/04/2007  . DIVERTICULOSIS, COLON 12/04/2007  . DYSPHAGIA UNSPECIFIED 03/16/2008  . ERECTILE DYSFUNCTION 04/09/2007  . ESOPHAGEAL STRICTURE 05/26/2008  .  GERD 12/04/2007  . HEMORRHOIDS, RECURRENT 02/11/2008  . HYPERLIPIDEMIA 12/04/2007  . HYPERTENSION 03/28/2007  . LEG PAIN, BILATERAL 01/25/2010  . LOW BACK PAIN 04/09/2007  . LUNG NODULE 12/04/2007  . Osteoarth NOS-Unspec 03/28/2007  . Other dysphagia 11/21/2009  . Peripheral neuropathy 05/29/2018  . POLYARTHRALGIA 07/13/2008  . Polymyalgia rheumatica ([place]) 07/26/2008  . PVD WITH CLAUDICATION 02/10/2010  . RENAL INSUFFICIENCY 03/15/2010  . SLEEP APNEA, OBSTRUCTIVE, MODERATE 04/09/2007  . TB SKIN TEST, POSITIVE 03/28/2007   Past Surgical History:  Procedure Laterality Date  . ROTATOR CUFF REPAIR     Left  . TOTAL KNEE ARTHROPLASTY     x 2    reports that he has never smoked. He has never used smokeless tobacco. He reports that he does not drink alcohol or use drugs. family history includes Asthma in an other family member; Heart disease in his father; Hypertension in his brother; Lupus in his brother. Allergies  Allergen Reactions  . Ace Inhibitors Swelling    Angioedema throat  . Augmentin [Amoxicillin-Pot Clavulanate] Other (See Comments)    Marked weakness with po med x 3 doses  . Doxycycline     Some GI upset  . Sulfa Antibiotics Hives    And facial angioedema   Current Outpatient Medications on File Prior to Visit  Medication Sig Dispense Refill  . acetaminophen (TYLENOL 8 HOUR) 650 MG CR tablet Take 650 mg by mouth every 6 (six) hours as needed (pain/headache).    Marland Kitchen albuterol (PROVENTIL HFA;VENTOLIN HFA) 108 (90 Base) MCG/ACT inhaler Inhale 2 puffs into the lungs every 6 (six) hours as needed for wheezing or shortness of breath.  3 Inhaler 3  . allopurinol (ZYLOPRIM) 100 MG tablet Take 1 tablet (100 mg total) by mouth daily. 90 tablet 0  . amLODipine (NORVASC) 5 MG tablet TAKE 1 TABLET(5 MG) BY MOUTH DAILY 30 tablet 5  . atorvastatin (LIPITOR) 40 MG tablet TAKE 1 TABLET(40 MG) BY MOUTH DAILY 90 tablet 1  . azithromycin (ZITHROMAX Z-PAK) 250 MG tablet 2 tab by mouth day 1, then 1 per day  6 tablet 1  . benzonatate (TESSALON) 200 MG capsule Take 1 capsule (200 mg total) by mouth 3 (three) times daily as needed for cough. 45 capsule 3  . BYSTOLIC 10 MG tablet TAKE 2 TABLETS(20 MG) BY MOUTH DAILY 60 tablet 0  . colchicine 0.6 MG tablet Take 1 tablet (0.6 mg total) by mouth 2 (two) times daily. 60 tablet 5  . furosemide (LASIX) 40 MG tablet take 1 tablet by mouth every morning AND 1 IN THE EVENING AS NEEDED FOR SWELLING ONLY 60 tablet 5  . gabapentin (NEURONTIN) 300 MG capsule Take 1 capsule (300 mg total) by mouth 3 (three) times daily. 90 capsule 5  . HYDROcodone-acetaminophen (NORCO) 7.5-325 MG tablet Take 1 tablet by mouth every 6 (six) hours as needed for moderate pain. 30 tablet 0  . pantoprazole (PROTONIX) 40 MG tablet TAKE 1 TABLET BY MOUTH ONCE DAILY 90 tablet 0  . tamsulosin (FLOMAX) 0.4 MG CAPS capsule TAKE 1 CAPSULE BY MOUTH TWICE A DAY 180 capsule 0   No current facility-administered medications on file prior to visit.    Review of Systems Constitutional: Negative for other unusual diaphoresis, sweats, appetite or weight changes HENT: Negative for other worsening hearing loss, ear pain, facial swelling, mouth sores or neck stiffness.   Eyes: Negative for other worsening pain, redness or other visual disturbance.  Respiratory: Negative for other stridor or swelling Cardiovascular: Negative for other palpitations or other chest pain  Gastrointestinal: Negative for worsening diarrhea or loose stools, blood in stool, distention or other pain Genitourinary: Negative for hematuria, flank pain or other change in urine volume.  Musculoskeletal: Negative for myalgias or other joint swelling.  Skin: Negative for other color change, or other wound or worsening drainage.  Neurological: Negative for other syncope or numbness. Hematological: Negative for other adenopathy or swelling Psychiatric/Behavioral: Negative for hallucinations, other worsening agitation, SI, self-injury, or  new decreased concentration All other system neg per pt    Objective:   Physical Exam BP 116/64   Pulse (!) 50   Temp 97.6 F (36.4 C) (Oral)   Ht 5\' 9"  (1.753 m)   Wt 293 lb (132.9 kg)   SpO2 98%   BMI 43.27 kg/m  VS noted, mild ill Constitutional: Pt is oriented to person, place, and time. Appears well-developed and well-nourished, in no significant distress and comfortable Head: Normocephalic and atraumatic  Bilat tm's with mild erythema.  Max sinus areas non tender.  Pharynx with severe erythema, no exudate Eyes: Conjunctivae and EOM are normal. Pupils are equal, round, and reactive to light Right Ear: External ear normal without discharge Left Ear: External ear normal without discharge Nose: Nose without discharge or deformity Mouth/Throat: Oropharynx is without other ulcerations and moist  Neck: Normal range of motion. Neck supple. No JVD present. No tracheal deviation present or significant neck LA or mass Cardiovascular: Normal rate, regular rhythm, normal heart sounds and intact distal pulses.   Pulmonary/Chest: WOB normal and breath sounds without rales or wheezing  Abdominal: Soft. Bowel sounds are normal. NT. No HSM  Musculoskeletal: Normal range of motion. Exhibits no edema Lymphadenopathy: Has no other cervical adenopathy.  Neurological: Pt is alert and oriented to person, place, and time. Pt has normal reflexes. No cranial nerve deficit. Motor grossly intact, Gait intact Skin: Skin is warm and dry. No rash noted or new ulcerations Psychiatric:  Has normal mood and affect. Behavior is normal without agitation No other exam findings  Lab Results  Component Value Date   WBC 4.8 07/25/2018   HGB 13.1 07/25/2018   HCT 40.8 07/25/2018   PLT 222.0 07/25/2018   GLUCOSE 94 07/25/2018   CHOL 147 08/16/2017   TRIG 69.0 08/16/2017   HDL 40.00 08/16/2017   LDLDIRECT 156.9 10/10/2010   LDLCALC 93 08/16/2017   ALT 12 07/25/2018   AST 14 07/25/2018   NA 141 07/25/2018     K 4.5 07/25/2018   CL 106 07/25/2018   CREATININE 1.91 (H) 07/25/2018   BUN 36 (H) 07/25/2018   CO2 26 07/25/2018   TSH 2.09 08/16/2017   PSA 0.98 08/16/2017   HGBA1C 6.5 (H) 02/09/2018       Assessment & Plan:

## 2018-08-19 NOTE — Assessment & Plan Note (Signed)

## 2018-08-19 NOTE — Assessment & Plan Note (Signed)
Stable,  to f/u any worsening symptoms or concerns 

## 2018-08-19 NOTE — Assessment & Plan Note (Addendum)
Ok for letter to landlord to be released from his lease  In addition to the time spent performing CPE, I spent an additional 25 minutes face to face,in which greater than 50% of this time was spent in counseling and coordination of care for patient's acute illness as documented, including the differential dx, treatment, further evaluation and other management of recurent falls, acute pharyngitis, PVD, PMR, and hyperglycemia

## 2018-08-19 NOTE — Assessment & Plan Note (Signed)
Mild to mod, for antibx course,  to f/u any worsening symptoms or concerns 

## 2018-08-20 ENCOUNTER — Telehealth: Payer: Self-pay

## 2018-08-20 ENCOUNTER — Encounter: Payer: Self-pay | Admitting: Gastroenterology

## 2018-08-20 NOTE — Telephone Encounter (Signed)
Copied from [place] [phone]. Topic: General - Other >> Aug 20, 2018 11:06 AM Jodie Echevaria wrote: Reason for CRM: Patient called to say that he received a call to schedule an appointment with a specialist for blood in his stool. Asking for Dr Gwynn Burly nurse to give him a call back regarding this matter. Ph# [phone]

## 2018-08-20 NOTE — Telephone Encounter (Signed)
Spoke to pt - he hasn't noticed any blood recently in his stool and he will decide when GI makes reminder call whether to keep appt.

## 2018-08-22 NOTE — Progress Notes (Deleted)
Subjective:   Troy Palmer is a 73 y.o. male who presents for Medicare Annual/Subsequent preventive examination.  Review of Systems:  No ROS.  Medicare Wellness Visit. Additional risk factors are reflected in the social history.    Sleep patterns: {SX; SLEEP PATTERNS:18802::"feels rested on waking","does not get up to void","gets up *** times nightly to void","sleeps *** hours nightly"}.    Home Safety/Smoke Alarms: Feels safe in home. Smoke alarms in place.  Living environment; residence and Firearm Safety: {Rehab home environment / accessibility:30080::"no firearms","firearms stored safely"}. Seat Belt Safety/Bike Helmet: Wears seat belt.      Objective:    Vitals: There were no vitals taken for this visit.  There is no height or weight on file to calculate BMI.  Advanced Directives 02/24/2018 02/09/2018 08/22/2017 01/12/2017 12/17/2016  Does Patient Have a Medical Advance Directive? No No No No No  Would patient like information on creating a medical advance directive? No - Patient declined No - Patient declined Yes (ED - Information included in AVS) No - Patient declined -    Tobacco Social History   Tobacco Use  Smoking Status Never Smoker  Smokeless Tobacco Never Used     Counseling given: Not Answered  Past Medical History:  Diagnosis Date  . ALLERGIC RHINITIS 04/09/2007  . ASTHMA 12/04/2007  . BACK PAIN 09/16/2009  . BENIGN PROSTATIC HYPERTROPHY 04/09/2007  . CEREBROVASCULAR ACCIDENT, HX OF 07/17/2010  . COLONIC POLYPS, HX OF 12/04/2007  . CONSTIPATION 09/25/2010  . Degeneration of cervical intervertebral disc 03/28/2007  . DEPRESSION 12/04/2007  . DIVERTICULOSIS, COLON 12/04/2007  . DYSPHAGIA UNSPECIFIED 03/16/2008  . ERECTILE DYSFUNCTION 04/09/2007  . ESOPHAGEAL STRICTURE 05/26/2008  . GERD 12/04/2007  . HEMORRHOIDS, RECURRENT 02/11/2008  . HYPERLIPIDEMIA 12/04/2007  . HYPERTENSION 03/28/2007  . LEG PAIN, BILATERAL 01/25/2010  . LOW BACK PAIN 04/09/2007  . LUNG NODULE  12/04/2007  . Osteoarth NOS-Unspec 03/28/2007  . Other dysphagia 11/21/2009  . Peripheral neuropathy 05/29/2018  . POLYARTHRALGIA 07/13/2008  . Polymyalgia rheumatica ([place]) 07/26/2008  . PVD WITH CLAUDICATION 02/10/2010  . RENAL INSUFFICIENCY 03/15/2010  . SLEEP APNEA, OBSTRUCTIVE, MODERATE 04/09/2007  . TB SKIN TEST, POSITIVE 03/28/2007   Past Surgical History:  Procedure Laterality Date  . ROTATOR CUFF REPAIR     Left  . TOTAL KNEE ARTHROPLASTY     x 2   Family History  Problem Relation Age of Onset  . Heart disease Father   . Lupus Brother   . Hypertension Brother   . Asthma Other    Social History   Socioeconomic History  . Marital status: Widowed    Spouse name: Not on file  . Number of children: 6  . Years of education: Not on file  . Highest education level: Not on file  Occupational History  . Occupation: Art gallery manager  . Financial resource strain: Not very hard  . Food insecurity:    Worry: Sometimes true    Inability: Sometimes true  . Transportation needs:    Medical: No    Non-medical: No  Tobacco Use  . Smoking status: Never Smoker  . Smokeless tobacco: Never Used  Substance and Sexual Activity  . Alcohol use: No  . Drug use: No  . Sexual activity: Not Currently  Lifestyle  . Physical activity:    Days per week: 0 days    Minutes per session: 0 min  . Stress: Only a little  Relationships  . Social connections:    Talks  on phone: More than three times a week    Gets together: More than three times a week    Attends religious service: More than 4 times per year    Active member of club or organization: Not on file    Attends meetings of clubs or organizations: More than 4 times per year    Relationship status: Widowed  Other Topics Concern  . Not on file  Social History Narrative   Patient does not get regular exercise.   6 children - 1 died with suicide, 1 with homicide    Outpatient Encounter Medications as of  08/25/2018  Medication Sig  . acetaminophen (TYLENOL 8 HOUR) 650 MG CR tablet Take 650 mg by mouth every 6 (six) hours as needed (pain/headache).  Marland Kitchen albuterol (PROVENTIL HFA;VENTOLIN HFA) 108 (90 Base) MCG/ACT inhaler Inhale 2 puffs into the lungs every 6 (six) hours as needed for wheezing or shortness of breath.  . allopurinol (ZYLOPRIM) 100 MG tablet Take 1 tablet (100 mg total) by mouth daily.  Marland Kitchen amLODipine (NORVASC) 5 MG tablet TAKE 1 TABLET(5 MG) BY MOUTH DAILY  . atorvastatin (LIPITOR) 40 MG tablet TAKE 1 TABLET(40 MG) BY MOUTH DAILY  . azithromycin (ZITHROMAX Z-PAK) 250 MG tablet 2 tab by mouth day 1, then 1 per day  . azithromycin (ZITHROMAX Z-PAK) 250 MG tablet 2 tab by mouth day 1, then 1 per day  . benzonatate (TESSALON) 200 MG capsule Take 1 capsule (200 mg total) by mouth 3 (three) times daily as needed for cough.  . BYSTOLIC 10 MG tablet TAKE 2 TABLETS(20 MG) BY MOUTH DAILY  . colchicine 0.6 MG tablet Take 1 tablet (0.6 mg total) by mouth 2 (two) times daily.  . furosemide (LASIX) 40 MG tablet take 1 tablet by mouth every morning AND 1 IN THE EVENING AS NEEDED FOR SWELLING ONLY  . gabapentin (NEURONTIN) 300 MG capsule Take 1 capsule (300 mg total) by mouth 3 (three) times daily.  Marland Kitchen HYDROcodone-acetaminophen (NORCO) 7.5-325 MG tablet Take 1 tablet by mouth every 6 (six) hours as needed for moderate pain.  . pantoprazole (PROTONIX) 40 MG tablet TAKE 1 TABLET BY MOUTH ONCE DAILY  . predniSONE (DELTASONE) 10 MG tablet 3 tabs by mouth per day for 3 days,2tabs per day for 3 days,1tab per day for 3 days  . tamsulosin (FLOMAX) 0.4 MG CAPS capsule TAKE 1 CAPSULE BY MOUTH TWICE A DAY   No facility-administered encounter medications on file as of 08/25/2018.     Activities of Daily Living In your present state of health, do you have any difficulty performing the following activities: 02/09/2018 08/22/2017  Hearing? N N  Vision? N N  Difficulty concentrating or making decisions? N N  Walking  or climbing stairs? N N  Dressing or bathing? N N  Doing errands, shopping? N N  Preparing Food and eating ? - N  Using the Toilet? - N  In the past six months, have you accidently leaked urine? - N  Do you have problems with loss of bowel control? - N  Managing your Medications? - N  Managing your Finances? - N  Housekeeping or managing your Housekeeping? - N  Some recent data might be hidden    Patient Care Team: Biagio Borg, MD as PCP - General   Assessment:   This is a routine wellness examination for [facility]. Physical assessment deferred to PCP.  Exercise Activities and Dietary recommendations   Diet (meal preparation, eat out, water intake,  caffeinated beverages, dairy products, fruits and vegetables): {Desc; diets:16563}   Goals    . Patient Stated     Loose weight by starting to eat healthier, increase vegetables, join Silver Sneakers, increase water intake.        Fall Risk Fall Risk  08/19/2018 08/22/2017 08/16/2017 11/16/2016 11/16/2015  Falls in the past year? 1 No No No No  Number falls in past yr: 1 - - - -  Injury with Fall? 0 - - - -   Depression Screen PHQ 2/9 Scores 08/19/2018 02/24/2018 08/22/2017 08/16/2017  PHQ - 2 Score 0 0 2 0  PHQ- 9 Score - - 5 0    Cognitive Function        Immunization History  Administered Date(s) Administered  . Influenza Whole 05/26/2008, 05/21/2012, 05/11/2013  . Influenza, High Dose Seasonal PF 05/05/2018  . Influenza-Unspecified 05/29/2014  . Pneumococcal Conjugate-13 10/15/2013  . Pneumococcal Polysaccharide-23 10/15/2011  . Tetanus 10/14/2012   Screening Tests Health Maintenance  Topic Date Due  . COLONOSCOPY  10/29/2020  . TETANUS/TDAP  10/15/2022  . INFLUENZA VACCINE  Completed  . Hepatitis C Screening  Completed  . PNA vac Low Risk Adult  Completed      Plan:     I have personally reviewed and noted the following in the patient's chart:   . Medical and social history . Use of alcohol, tobacco or illicit  drugs  . Current medications and supplements . Functional ability and status . Nutritional status . Physical activity . Advanced directives . List of other physicians . Hospitalizations, surgeries, and ER visits in previous 12 months . Vitals . Screenings to include cognitive, depression, and falls . Referrals and appointments  In addition, I have reviewed and discussed with patient certain preventive protocols, quality metrics, and best practice recommendations. A written personalized care plan for preventive services as well as general preventive health recommendations were provided to patient.     Michiel Cowboy, RN  08/22/2018

## 2018-08-25 ENCOUNTER — Ambulatory Visit: Payer: PPO

## 2018-08-27 ENCOUNTER — Encounter

## 2018-08-27 ENCOUNTER — Ambulatory Visit: Payer: PPO | Admitting: Neurology

## 2018-09-02 ENCOUNTER — Ambulatory Visit: Payer: Medicare Other | Admitting: Gastroenterology

## 2018-09-10 ENCOUNTER — Ambulatory Visit (INDEPENDENT_AMBULATORY_CARE_PROVIDER_SITE_OTHER): Payer: Medicare Other | Admitting: Internal Medicine

## 2018-09-10 ENCOUNTER — Encounter: Payer: Self-pay | Admitting: Internal Medicine

## 2018-09-10 VITALS — BP 126/82 | HR 61 | Temp 97.6°F | Ht 69.0 in | Wt 293.0 lb

## 2018-09-10 DIAGNOSIS — K219 Gastro-esophageal reflux disease without esophagitis: Secondary | ICD-10-CM

## 2018-09-10 DIAGNOSIS — J45991 Cough variant asthma: Secondary | ICD-10-CM

## 2018-09-10 DIAGNOSIS — I10 Essential (primary) hypertension: Secondary | ICD-10-CM

## 2018-09-10 MED ORDER — PREDNISONE 10 MG PO TABS: ORAL_TABLET | ORAL | 0 refills | Status: DC

## 2018-09-10 MED ORDER — PANTOPRAZOLE SODIUM 40 MG PO TBEC: 40.0000 mg | DELAYED_RELEASE_TABLET | Freq: Every day | ORAL | 3 refills | Status: DC

## 2018-09-10 MED ORDER — BENZONATATE 200 MG PO CAPS: 200.0000 mg | ORAL_CAPSULE | Freq: Three times a day (TID) | ORAL | 3 refills | Status: DC | PRN

## 2018-09-10 NOTE — Assessment & Plan Note (Signed)
Mild to mod, for tessalon perle and predpac asd,  to f/u any worsening symptoms or concerns

## 2018-09-10 NOTE — Progress Notes (Signed)
Subjective:    Patient ID: Troy Palmer, male    DOB: [birth year], 73 y.o.   MRN: [id number]  HPI  Here with acute onset mild to mod 2 wks ST, HA, general weakness and malaise, with prod cough clear sputum and mild wheezing/sob, but Pt denies chest pain, orthopnea, PND, increased LE swelling, palpitations, dizziness or syncope.  Denies worsening abd pain, dysphagia, n/v, bowel change or blood, but has had worsening recurrent mild reflux.   Past Medical History:  Diagnosis Date  . ALLERGIC RHINITIS 04/09/2007  . ASTHMA 12/04/2007  . BACK PAIN 09/16/2009  . BENIGN PROSTATIC HYPERTROPHY 04/09/2007  . CEREBROVASCULAR ACCIDENT, HX OF 07/17/2010  . COLONIC POLYPS, HX OF 12/04/2007  . CONSTIPATION 09/25/2010  . Degeneration of cervical intervertebral disc 03/28/2007  . DEPRESSION 12/04/2007  . DIVERTICULOSIS, COLON 12/04/2007  . DYSPHAGIA UNSPECIFIED 03/16/2008  . ERECTILE DYSFUNCTION 04/09/2007  . ESOPHAGEAL STRICTURE 05/26/2008  . GERD 12/04/2007  . HEMORRHOIDS, RECURRENT 02/11/2008  . HYPERLIPIDEMIA 12/04/2007  . HYPERTENSION 03/28/2007  . LEG PAIN, BILATERAL 01/25/2010  . LOW BACK PAIN 04/09/2007  . LUNG NODULE 12/04/2007  . Osteoarth NOS-Unspec 03/28/2007  . Other dysphagia 11/21/2009  . Peripheral neuropathy 05/29/2018  . POLYARTHRALGIA 07/13/2008  . Polymyalgia rheumatica ([place]) 07/26/2008  . PVD WITH CLAUDICATION 02/10/2010  . RENAL INSUFFICIENCY 03/15/2010  . SLEEP APNEA, OBSTRUCTIVE, MODERATE 04/09/2007  . TB SKIN TEST, POSITIVE 03/28/2007   Past Surgical History:  Procedure Laterality Date  . ROTATOR CUFF REPAIR     Left  . TOTAL KNEE ARTHROPLASTY     x 2    reports that he has never smoked. He has never used smokeless tobacco. He reports that he does not drink alcohol or use drugs. family history includes Asthma in an other family member; Heart disease in his father; Hypertension in his brother; Lupus in his brother. Allergies  Allergen Reactions  . Ace Inhibitors Swelling    Angioedema  throat  . Augmentin [Amoxicillin-Pot Clavulanate] Other (See Comments)    Marked weakness with po med x 3 doses  . Doxycycline     Some GI upset  . Sulfa Antibiotics Hives    And facial angioedema   Current Outpatient Medications on File Prior to Visit  Medication Sig Dispense Refill  . acetaminophen (TYLENOL 8 HOUR) 650 MG CR tablet Take 650 mg by mouth every 6 (six) hours as needed (pain/headache).    Marland Kitchen albuterol (PROVENTIL HFA;VENTOLIN HFA) 108 (90 Base) MCG/ACT inhaler Inhale 2 puffs into the lungs every 6 (six) hours as needed for wheezing or shortness of breath. 3 Inhaler 3  . allopurinol (ZYLOPRIM) 100 MG tablet Take 1 tablet (100 mg total) by mouth daily. 90 tablet 0  . amLODipine (NORVASC) 5 MG tablet TAKE 1 TABLET(5 MG) BY MOUTH DAILY 30 tablet 5  . atorvastatin (LIPITOR) 40 MG tablet TAKE 1 TABLET(40 MG) BY MOUTH DAILY 90 tablet 1  . azithromycin (ZITHROMAX Z-PAK) 250 MG tablet 2 tab by mouth day 1, then 1 per day 6 tablet 1  . azithromycin (ZITHROMAX Z-PAK) 250 MG tablet 2 tab by mouth day 1, then 1 per day 6 tablet 1  . BYSTOLIC 10 MG tablet TAKE 2 TABLETS(20 MG) BY MOUTH DAILY 60 tablet 0  . colchicine 0.6 MG tablet Take 1 tablet (0.6 mg total) by mouth 2 (two) times daily. 60 tablet 5  . furosemide (LASIX) 40 MG tablet take 1 tablet by mouth every morning AND 1 IN THE EVENING AS NEEDED FOR  SWELLING ONLY 60 tablet 5  . gabapentin (NEURONTIN) 300 MG capsule Take 1 capsule (300 mg total) by mouth 3 (three) times daily. 90 capsule 5  . HYDROcodone-acetaminophen (NORCO) 7.5-325 MG tablet Take 1 tablet by mouth every 6 (six) hours as needed for moderate pain. 30 tablet 0  . tamsulosin (FLOMAX) 0.4 MG CAPS capsule TAKE 1 CAPSULE BY MOUTH TWICE A DAY 180 capsule 0   No current facility-administered medications on file prior to visit.    Review of Systems  Constitutional: Negative for other unusual diaphoresis or sweats HENT: Negative for ear discharge or swelling Eyes: Negative  for other worsening visual disturbances Respiratory: Negative for stridor or other swelling  Gastrointestinal: Negative for worsening distension or other blood Genitourinary: Negative for retention or other urinary change Musculoskeletal: Negative for other MSK pain or swelling Skin: Negative for color change or other new lesions Neurological: Negative for worsening tremors and other numbness  Psychiatric/Behavioral: Negative for worsening agitation or other fatigue All other system neg per pt    Objective:   Physical Exam BP 126/82   Pulse 61   Temp 97.6 F (36.4 C) (Oral)   Ht 5\' 9"  (1.753 m)   Wt 293 lb (132.9 kg)   SpO2 94%   BMI 43.27 kg/m  VS noted, mild ill Constitutional: Pt appears in NAD HENT: Head: NCAT.  Right Ear: External ear normal.  Left Ear: External ear normal.  Eyes: . Pupils are equal, round, and reactive to light. Conjunctivae and EOM are normal Nose: without d/c or deformity Neck: Neck supple. Gross normal ROM Cardiovascular: Normal rate and regular rhythm.   Pulmonary/Chest: Effort normal and breath sounds decreased without rales but with few trace bilat wheezing.  Neurological: Pt is alert. At baseline orientation, motor grossly intact Skin: Skin is warm. No rashes, other new lesions, no LE edema Psychiatric: Pt behavior is normal without agitation  No other exam findings Lab Results  Component Value Date   WBC 5.1 08/19/2018   HGB 13.0 08/19/2018   HCT 40.6 08/19/2018   PLT 208.0 08/19/2018   GLUCOSE 75 08/19/2018   CHOL 175 08/19/2018   TRIG 65.0 08/19/2018   HDL 52.60 08/19/2018   LDLDIRECT 156.9 10/10/2010   LDLCALC 110 (H) 08/19/2018   ALT 14 08/19/2018   AST 15 08/19/2018   NA 142 08/19/2018   K 5.1 08/19/2018   CL 106 08/19/2018   CREATININE 1.97 (H) 08/19/2018   BUN 29 (H) 08/19/2018   CO2 32 08/19/2018   TSH 1.97 08/19/2018   PSA 1.11 08/19/2018   HGBA1C 5.8 08/19/2018        Assessment & Plan:

## 2018-09-10 NOTE — Patient Instructions (Signed)
Please take all new medication as prescribed - the cough pill, prednisone, and the protonix for acid  Please continue all other medications as before, and refills have been done if requested.  Please have the pharmacy call with any other refills you may need.  Please keep your appointments with your specialists as you may have planned

## 2018-09-10 NOTE — Assessment & Plan Note (Signed)
Mild to mod, for protonix 40 qd,  to f/u any worsening symptoms or concerns

## 2018-09-10 NOTE — Assessment & Plan Note (Signed)
stable overall by history and exam, recent data reviewed with pt, and pt to continue medical treatment as before,  to f/u any worsening symptoms or concerns  

## 2018-09-24 ENCOUNTER — Other Ambulatory Visit: Payer: Self-pay | Admitting: Internal Medicine

## 2018-09-24 NOTE — Progress Notes (Deleted)
Subjective:   Troy Palmer is a 73 y.o. male who presents for Medicare Annual/Subsequent preventive examination.  Review of Systems:  No ROS.  Medicare Wellness Visit. Additional risk factors are reflected in the social history.    Sleep patterns: {SX; SLEEP PATTERNS:18802::"feels rested on waking","does not get up to void","gets up *** times nightly to void","sleeps *** hours nightly"}.    Home Safety/Smoke Alarms: Feels safe in home. Smoke alarms in place.  Living environment; residence and Firearm Safety: {Rehab home environment / accessibility:30080::"no firearms","firearms stored safely"}. Seat Belt Safety/Bike Helmet: Wears seat belt.   PSA-  Lab Results  Component Value Date   PSA 1.11 08/19/2018   PSA 0.98 08/16/2017   PSA 1.13 11/16/2015      Objective:    Vitals: There were no vitals taken for this visit.  There is no height or weight on file to calculate BMI.  Advanced Directives 02/24/2018 02/09/2018 08/22/2017 01/12/2017 12/17/2016  Does Patient Have a Medical Advance Directive? No No No No No  Would patient like information on creating a medical advance directive? No - Patient declined No - Patient declined Yes (ED - Information included in AVS) No - Patient declined -    Tobacco Social History   Tobacco Use  Smoking Status Never Smoker  Smokeless Tobacco Never Used     Counseling given: Not Answered  Past Medical History:  Diagnosis Date  . ALLERGIC RHINITIS 04/09/2007  . ASTHMA 12/04/2007  . BACK PAIN 09/16/2009  . BENIGN PROSTATIC HYPERTROPHY 04/09/2007  . CEREBROVASCULAR ACCIDENT, HX OF 07/17/2010  . COLONIC POLYPS, HX OF 12/04/2007  . CONSTIPATION 09/25/2010  . Degeneration of cervical intervertebral disc 03/28/2007  . DEPRESSION 12/04/2007  . DIVERTICULOSIS, COLON 12/04/2007  . DYSPHAGIA UNSPECIFIED 03/16/2008  . ERECTILE DYSFUNCTION 04/09/2007  . ESOPHAGEAL STRICTURE 05/26/2008  . GERD 12/04/2007  . HEMORRHOIDS, RECURRENT 02/11/2008  . HYPERLIPIDEMIA  12/04/2007  . HYPERTENSION 03/28/2007  . LEG PAIN, BILATERAL 01/25/2010  . LOW BACK PAIN 04/09/2007  . LUNG NODULE 12/04/2007  . Osteoarth NOS-Unspec 03/28/2007  . Other dysphagia 11/21/2009  . Peripheral neuropathy 05/29/2018  . POLYARTHRALGIA 07/13/2008  . Polymyalgia rheumatica ([place]) 07/26/2008  . PVD WITH CLAUDICATION 02/10/2010  . RENAL INSUFFICIENCY 03/15/2010  . SLEEP APNEA, OBSTRUCTIVE, MODERATE 04/09/2007  . TB SKIN TEST, POSITIVE 03/28/2007   Past Surgical History:  Procedure Laterality Date  . ROTATOR CUFF REPAIR     Left  . TOTAL KNEE ARTHROPLASTY     x 2   Family History  Problem Relation Age of Onset  . Heart disease Father   . Lupus Brother   . Hypertension Brother   . Asthma Other    Social History   Socioeconomic History  . Marital status: Widowed    Spouse name: Not on file  . Number of children: 6  . Years of education: Not on file  . Highest education level: Not on file  Occupational History  . Occupation: Art gallery manager  . Financial resource strain: Not very hard  . Food insecurity:    Worry: Sometimes true    Inability: Sometimes true  . Transportation needs:    Medical: No    Non-medical: No  Tobacco Use  . Smoking status: Never Smoker  . Smokeless tobacco: Never Used  Substance and Sexual Activity  . Alcohol use: No  . Drug use: No  . Sexual activity: Not Currently  Lifestyle  . Physical activity:    Days per week: 0  days    Minutes per session: 0 min  . Stress: Only a little  Relationships  . Social connections:    Talks on phone: More than three times a week    Gets together: More than three times a week    Attends religious service: More than 4 times per year    Active member of club or organization: Not on file    Attends meetings of clubs or organizations: More than 4 times per year    Relationship status: Widowed  Other Topics Concern  . Not on file  Social History Narrative   Patient does not get regular  exercise.   6 children - 1 died with suicide, 1 with homicide    Outpatient Encounter Medications as of 09/25/2018  Medication Sig  . acetaminophen (TYLENOL 8 HOUR) 650 MG CR tablet Take 650 mg by mouth every 6 (six) hours as needed (pain/headache).  Marland Kitchen albuterol (PROVENTIL HFA;VENTOLIN HFA) 108 (90 Base) MCG/ACT inhaler Inhale 2 puffs into the lungs every 6 (six) hours as needed for wheezing or shortness of breath.  . allopurinol (ZYLOPRIM) 100 MG tablet Take 1 tablet (100 mg total) by mouth daily.  Marland Kitchen amLODipine (NORVASC) 5 MG tablet TAKE 1 TABLET(5 MG) BY MOUTH DAILY  . atorvastatin (LIPITOR) 40 MG tablet TAKE 1 TABLET(40 MG) BY MOUTH DAILY  . azithromycin (ZITHROMAX Z-PAK) 250 MG tablet 2 tab by mouth day 1, then 1 per day  . azithromycin (ZITHROMAX Z-PAK) 250 MG tablet 2 tab by mouth day 1, then 1 per day  . benzonatate (TESSALON) 200 MG capsule Take 1 capsule (200 mg total) by mouth 3 (three) times daily as needed for cough.  . BYSTOLIC 10 MG tablet TAKE 2 TABLETS(20 MG) BY MOUTH DAILY  . colchicine 0.6 MG tablet Take 1 tablet (0.6 mg total) by mouth 2 (two) times daily.  . furosemide (LASIX) 40 MG tablet take 1 tablet by mouth every morning AND 1 IN THE EVENING AS NEEDED FOR SWELLING ONLY  . gabapentin (NEURONTIN) 300 MG capsule Take 1 capsule (300 mg total) by mouth 3 (three) times daily.  Marland Kitchen HYDROcodone-acetaminophen (NORCO) 7.5-325 MG tablet Take 1 tablet by mouth every 6 (six) hours as needed for moderate pain.  . pantoprazole (PROTONIX) 40 MG tablet Take 1 tablet (40 mg total) by mouth daily.  . predniSONE (DELTASONE) 10 MG tablet 3 tabs by mouth per day for 3 days,2tabs per day for 3 days,1tab per day for 3 days  . tamsulosin (FLOMAX) 0.4 MG CAPS capsule TAKE 1 CAPSULE BY MOUTH TWICE A DAY  . [DISCONTINUED] atorvastatin (LIPITOR) 40 MG tablet TAKE 1 TABLET(40 MG) BY MOUTH DAILY   No facility-administered encounter medications on file as of 09/25/2018.     Activities of Daily  Living In your present state of health, do you have any difficulty performing the following activities: 02/09/2018  Hearing? N  Vision? N  Difficulty concentrating or making decisions? N  Walking or climbing stairs? N  Dressing or bathing? N  Doing errands, shopping? N  Some recent data might be hidden    Patient Care Team: Biagio Borg, MD as PCP - General   Assessment:   This is a routine wellness examination for [facility]. Physical assessment deferred to PCP.  Exercise Activities and Dietary recommendations   Diet (meal preparation, eat out, water intake, caffeinated beverages, dairy products, fruits and vegetables): {Desc; diets:16563}   Goals    . Patient Stated     Loose weight  by starting to eat healthier, increase vegetables, join Silver Sneakers, increase water intake.        Fall Risk Fall Risk  08/19/2018 08/22/2017 08/16/2017 11/16/2016 11/16/2015  Falls in the past year? 1 No No No No  Number falls in past yr: 1 - - - -  Injury with Fall? 0 - - - -    Depression Screen PHQ 2/9 Scores 08/19/2018 02/24/2018 08/22/2017 08/16/2017  PHQ - 2 Score 0 0 2 0  PHQ- 9 Score - - 5 0    Cognitive Function        Immunization History  Administered Date(s) Administered  . Influenza Whole 05/26/2008, 05/21/2012, 05/11/2013  . Influenza, High Dose Seasonal PF 05/05/2018  . Influenza-Unspecified 05/29/2014  . Pneumococcal Conjugate-13 10/15/2013  . Pneumococcal Polysaccharide-23 10/15/2011  . Tetanus 10/14/2012   Screening Tests Health Maintenance  Topic Date Due  . COLONOSCOPY  10/29/2020  . TETANUS/TDAP  10/15/2022  . INFLUENZA VACCINE  Completed  . Hepatitis C Screening  Completed  . PNA vac Low Risk Adult  Completed      Plan:     I have personally reviewed and noted the following in the patient's chart:   . Medical and social history . Use of alcohol, tobacco or illicit drugs  . Current medications and supplements . Functional ability and status . Nutritional  status . Physical activity . Advanced directives . List of other physicians . Vitals . Screenings to include cognitive, depression, and falls . Referrals and appointments  In addition, I have reviewed and discussed with patient certain preventive protocols, quality metrics, and best practice recommendations. A written personalized care plan for preventive services as well as general preventive health recommendations were provided to patient.     Michiel Cowboy, RN  09/24/2018

## 2018-09-25 ENCOUNTER — Ambulatory Visit: Payer: Medicare Other

## 2018-09-26 ENCOUNTER — Other Ambulatory Visit: Payer: Self-pay | Admitting: Internal Medicine

## 2018-09-26 MED ORDER — NEBIVOLOL HCL 10 MG PO TABS: ORAL_TABLET | ORAL | 1 refills | Status: DC

## 2018-09-26 NOTE — Telephone Encounter (Signed)
Copied from [place] [id number]. Topic: Quick Communication - Rx Refill/Question >> Sep 26, 2018  9:31 AM Blase Mess A wrote: Medication: BYSTOLIC 10 MG tablet [[id number] Patient is out of medication. The pharmacy called yesterday  Has the patient contacted their pharmacy? Yes  (Agent: If no, request that the patient contact the pharmacy for the refill.) (Agent: If yes, when and what did the pharmacy advise?)  Preferred Pharmacy (with phone number or street name): [facility] [phone] - [place], [place] - [address] [phone] (Phone) [phone] (Fax)    Agent: Please be advised that RX refills may take up to 3 business days. We ask that you follow-up with your pharmacy.

## 2018-09-29 ENCOUNTER — Telehealth: Payer: Self-pay | Admitting: Internal Medicine

## 2018-09-29 MED ORDER — NEBIVOLOL HCL 10 MG PO TABS: ORAL_TABLET | ORAL | 1 refills | Status: DC

## 2018-09-29 NOTE — Addendum Note (Signed)
Addended by: Addison Naegeli on: 09/29/2018 10:25 AM   Modules accepted: Orders

## 2018-09-29 NOTE — Telephone Encounter (Signed)
I called [facility] and they do not have bystolic refill. Please resend

## 2018-09-29 NOTE — Telephone Encounter (Signed)
Copied from [place] [id number]. Topic: Quick Communication - See Telephone Encounter >> Sep 29, 2018 11:53 AM Blase Mess A wrote: CRM for notification. See Telephone encounter for: 09/29/18.  Bonita from [facility] is asking if the script can be resent for nebivolol (BYSTOLIC) 10 MG tablet [[id number]] Please advise\ [id number] - [place], [address] AT [address] [phone] (Phone) [phone] (Fax)

## 2018-10-09 DIAGNOSIS — N183 Chronic kidney disease, stage 3 (moderate): Secondary | ICD-10-CM | POA: Diagnosis not present

## 2018-10-09 DIAGNOSIS — I129 Hypertensive chronic kidney disease with stage 1 through stage 4 chronic kidney disease, or unspecified chronic kidney disease: Secondary | ICD-10-CM | POA: Diagnosis not present

## 2018-10-09 DIAGNOSIS — M109 Gout, unspecified: Secondary | ICD-10-CM | POA: Diagnosis not present

## 2018-10-21 ENCOUNTER — Other Ambulatory Visit: Payer: Self-pay | Admitting: Internal Medicine

## 2018-10-21 NOTE — Progress Notes (Deleted)
Subjective:   Troy Palmer is a 73 y.o. male who presents for Medicare Annual/Subsequent preventive examination.  Review of Systems:  No ROS.  Medicare Wellness Visit. Additional risk factors are reflected in the social history.    Sleep patterns: {SX; SLEEP PATTERNS:18802::"feels rested on waking","does not get up to void","gets up *** times nightly to void","sleeps *** hours nightly"}.    Home Safety/Smoke Alarms: Feels safe in home. Smoke alarms in place.  Living environment; residence and Firearm Safety: {Rehab home environment / accessibility:30080::"no firearms","firearms stored safely"}. Seat Belt Safety/Bike Helmet: Wears seat belt.   PSA-  Lab Results  Component Value Date   PSA 1.11 08/19/2018   PSA 0.98 08/16/2017   PSA 1.13 11/16/2015       Objective:    Vitals: There were no vitals taken for this visit.  There is no height or weight on file to calculate BMI.  Advanced Directives 02/24/2018 02/09/2018 08/22/2017 01/12/2017 12/17/2016  Does Patient Have a Medical Advance Directive? No No No No No  Would patient like information on creating a medical advance directive? No - Patient declined No - Patient declined Yes (ED - Information included in AVS) No - Patient declined -    Tobacco Social History   Tobacco Use  Smoking Status Never Smoker  Smokeless Tobacco Never Used     Counseling given: Not Answered  Past Medical History:  Diagnosis Date  . ALLERGIC RHINITIS 04/09/2007  . ASTHMA 12/04/2007  . BACK PAIN 09/16/2009  . BENIGN PROSTATIC HYPERTROPHY 04/09/2007  . CEREBROVASCULAR ACCIDENT, HX OF 07/17/2010  . COLONIC POLYPS, HX OF 12/04/2007  . CONSTIPATION 09/25/2010  . Degeneration of cervical intervertebral disc 03/28/2007  . DEPRESSION 12/04/2007  . DIVERTICULOSIS, COLON 12/04/2007  . DYSPHAGIA UNSPECIFIED 03/16/2008  . ERECTILE DYSFUNCTION 04/09/2007  . ESOPHAGEAL STRICTURE 05/26/2008  . GERD 12/04/2007  . HEMORRHOIDS, RECURRENT 02/11/2008  . HYPERLIPIDEMIA  12/04/2007  . HYPERTENSION 03/28/2007  . LEG PAIN, BILATERAL 01/25/2010  . LOW BACK PAIN 04/09/2007  . LUNG NODULE 12/04/2007  . Osteoarth NOS-Unspec 03/28/2007  . Other dysphagia 11/21/2009  . Peripheral neuropathy 05/29/2018  . POLYARTHRALGIA 07/13/2008  . Polymyalgia rheumatica ([place]) 07/26/2008  . PVD WITH CLAUDICATION 02/10/2010  . RENAL INSUFFICIENCY 03/15/2010  . SLEEP APNEA, OBSTRUCTIVE, MODERATE 04/09/2007  . TB SKIN TEST, POSITIVE 03/28/2007   Past Surgical History:  Procedure Laterality Date  . ROTATOR CUFF REPAIR     Left  . TOTAL KNEE ARTHROPLASTY     x 2   Family History  Problem Relation Age of Onset  . Heart disease Father   . Lupus Brother   . Hypertension Brother   . Asthma Other    Social History   Socioeconomic History  . Marital status: Widowed    Spouse name: Not on file  . Number of children: 6  . Years of education: Not on file  . Highest education level: Not on file  Occupational History  . Occupation: Art gallery manager  . Financial resource strain: Not very hard  . Food insecurity:    Worry: Sometimes true    Inability: Sometimes true  . Transportation needs:    Medical: No    Non-medical: No  Tobacco Use  . Smoking status: Never Smoker  . Smokeless tobacco: Never Used  Substance and Sexual Activity  . Alcohol use: No  . Drug use: No  . Sexual activity: Not Currently  Lifestyle  . Physical activity:    Days per week:  0 days    Minutes per session: 0 min  . Stress: Only a little  Relationships  . Social connections:    Talks on phone: More than three times a week    Gets together: More than three times a week    Attends religious service: More than 4 times per year    Active member of club or organization: Not on file    Attends meetings of clubs or organizations: More than 4 times per year    Relationship status: Widowed  Other Topics Concern  . Not on file  Social History Narrative   Patient does not get regular  exercise.   6 children - 1 died with suicide, 1 with homicide    Outpatient Encounter Medications as of 10/22/2018  Medication Sig  . acetaminophen (TYLENOL 8 HOUR) 650 MG CR tablet Take 650 mg by mouth every 6 (six) hours as needed (pain/headache).  Marland Kitchen albuterol (PROVENTIL HFA;VENTOLIN HFA) 108 (90 Base) MCG/ACT inhaler Inhale 2 puffs into the lungs every 6 (six) hours as needed for wheezing or shortness of breath.  . allopurinol (ZYLOPRIM) 100 MG tablet Take 1 tablet (100 mg total) by mouth daily.  Marland Kitchen amLODipine (NORVASC) 5 MG tablet TAKE 1 TABLET(5 MG) BY MOUTH DAILY  . atorvastatin (LIPITOR) 40 MG tablet TAKE 1 TABLET(40 MG) BY MOUTH DAILY  . azithromycin (ZITHROMAX Z-PAK) 250 MG tablet 2 tab by mouth day 1, then 1 per day  . azithromycin (ZITHROMAX Z-PAK) 250 MG tablet 2 tab by mouth day 1, then 1 per day  . benzonatate (TESSALON) 200 MG capsule Take 1 capsule (200 mg total) by mouth 3 (three) times daily as needed for cough.  . colchicine 0.6 MG tablet Take 1 tablet (0.6 mg total) by mouth 2 (two) times daily.  . furosemide (LASIX) 40 MG tablet take 1 tablet by mouth every morning AND 1 IN THE EVENING AS NEEDED FOR SWELLING ONLY  . gabapentin (NEURONTIN) 300 MG capsule Take 1 capsule (300 mg total) by mouth 3 (three) times daily.  Marland Kitchen HYDROcodone-acetaminophen (NORCO) 7.5-325 MG tablet Take 1 tablet by mouth every 6 (six) hours as needed for moderate pain.  . nebivolol (BYSTOLIC) 10 MG tablet TAKE 2 TABLETS(20 MG) BY MOUTH DAILY  . pantoprazole (PROTONIX) 40 MG tablet Take 1 tablet (40 mg total) by mouth daily.  . predniSONE (DELTASONE) 10 MG tablet 3 tabs by mouth per day for 3 days,2tabs per day for 3 days,1tab per day for 3 days  . tamsulosin (FLOMAX) 0.4 MG CAPS capsule TAKE 1 CAPSULE BY MOUTH TWICE A DAY   No facility-administered encounter medications on file as of 10/22/2018.     Activities of Daily Living In your present state of health, do you have any difficulty performing the  following activities: 02/09/2018  Hearing? N  Vision? N  Difficulty concentrating or making decisions? N  Walking or climbing stairs? N  Dressing or bathing? N  Doing errands, shopping? N  Some recent data might be hidden    Patient Care Team: Biagio Borg, MD as PCP - General   Assessment:   This is a routine wellness examination for [facility]. Physical assessment deferred to PCP.  Exercise Activities and Dietary recommendations   Diet (meal preparation, eat out, water intake, caffeinated beverages, dairy products, fruits and vegetables): {Desc; diets:16563}   Goals    . Patient Stated     Loose weight by starting to eat healthier, increase vegetables, join Silver Sneakers, increase water intake.  Fall Risk Fall Risk  08/19/2018 08/22/2017 08/16/2017 11/16/2016 11/16/2015  Falls in the past year? 1 No No No No  Number falls in past yr: 1 - - - -  Injury with Fall? 0 - - - -   Depression Screen PHQ 2/9 Scores 08/19/2018 02/24/2018 08/22/2017 08/16/2017  PHQ - 2 Score 0 0 2 0  PHQ- 9 Score - - 5 0    Cognitive Function        Immunization History  Administered Date(s) Administered  . Influenza Whole 05/26/2008, 05/21/2012, 05/11/2013  . Influenza, High Dose Seasonal PF 05/05/2018  . Influenza-Unspecified 05/29/2014  . Pneumococcal Conjugate-13 10/15/2013  . Pneumococcal Polysaccharide-23 10/15/2011  . Tetanus 10/14/2012   Screening Tests Health Maintenance  Topic Date Due  . COLONOSCOPY  10/29/2020  . TETANUS/TDAP  10/15/2022  . INFLUENZA VACCINE  Completed  . Hepatitis C Screening  Completed  . PNA vac Low Risk Adult  Completed      Plan:     I have personally reviewed and noted the following in the patient's chart:   . Medical and social history . Use of alcohol, tobacco or illicit drugs  . Current medications and supplements . Functional ability and status . Nutritional status . Physical activity . Advanced directives . List of other  physicians . Vitals . Screenings to include cognitive, depression, and falls . Referrals and appointments  In addition, I have reviewed and discussed with patient certain preventive protocols, quality metrics, and best practice recommendations. A written personalized care plan for preventive services as well as general preventive health recommendations were provided to patient.     Michiel Cowboy, RN  10/21/2018

## 2018-10-22 ENCOUNTER — Ambulatory Visit: Payer: Medicare Other

## 2018-11-03 ENCOUNTER — Ambulatory Visit: Payer: Medicare Other | Admitting: Neurology

## 2018-12-23 ENCOUNTER — Other Ambulatory Visit: Payer: Self-pay

## 2018-12-23 MED ORDER — AMLODIPINE BESYLATE 5 MG PO TABS: ORAL_TABLET | ORAL | 5 refills | Status: DC

## 2019-01-19 ENCOUNTER — Other Ambulatory Visit: Payer: Self-pay

## 2019-01-19 ENCOUNTER — Encounter: Payer: Self-pay | Admitting: Neurology

## 2019-01-19 ENCOUNTER — Ambulatory Visit (INDEPENDENT_AMBULATORY_CARE_PROVIDER_SITE_OTHER): Payer: Medicare Other | Admitting: Neurology

## 2019-01-19 ENCOUNTER — Telehealth: Payer: Self-pay

## 2019-01-19 ENCOUNTER — Other Ambulatory Visit (INDEPENDENT_AMBULATORY_CARE_PROVIDER_SITE_OTHER): Payer: Medicare Other

## 2019-01-19 VITALS — BP 128/64 | HR 62 | Ht 69.5 in | Wt 290.0 lb

## 2019-01-19 DIAGNOSIS — I739 Peripheral vascular disease, unspecified: Secondary | ICD-10-CM

## 2019-01-19 DIAGNOSIS — G63 Polyneuropathy in diseases classified elsewhere: Secondary | ICD-10-CM

## 2019-01-19 DIAGNOSIS — D529 Folate deficiency anemia, unspecified: Secondary | ICD-10-CM | POA: Diagnosis not present

## 2019-01-19 DIAGNOSIS — D510 Vitamin B12 deficiency anemia due to intrinsic factor deficiency: Secondary | ICD-10-CM | POA: Diagnosis not present

## 2019-01-19 NOTE — Progress Notes (Signed)
[facility] Clinic Note - Initial Visit   Date: 01/19/19  Troy Palmer MRN: [id number] DOB: [birth year]   Dear Dr. Jenny Reichmann:  Thank you for your kind referral of Troy Palmer for consultation of neuropathy. Although his history is well known to you, please allow [place] to reiterate it for the purpose of our medical record. The patient was accompanied to the clinic by self.    History of Present Illness: Troy Palmer is a 73 y.o. left-handed male with peripheral vascular disease, hypertension, hyperlipidemia, gout, and BPH presenting for evaluation of neuropathy.   Starting around 2018, he began having numbness/tingling involving the feet and lower legs. Occasionally, his feet feel really cold and sensitive. Symptoms are constant.  He does not have associated pain and states that the tingling is more annoying than anything else.  He does not wish to take medication for this.   He has some imbalance.  He walks unassisted and has not had any falls this year.  In 2019, he suffered two falls in 2019.  NCS/EMG of the legs in October 2019 shows sensorimotor neuropathy in the legs.    He is not diabetic, no history of alcohol abuse, or family history of neuropathy.  He does have peripheral vascular disease.  Last ABI was in 2013 which showed bilateral ABI in the moderate range with abnormal bilateral great toe brachial indices.   Out-side paper records, electronic medical record, and images have been reviewed where available and summarized as:  MRI lumbar spine wo contrast 11/27/2014 1. Multifactorial mild lumbar spinal stenosis from L2-L3 to L3-L4,in part related to epidural lipomatosis. 2. Moderate multifactorial spinal stenosis at L4-L5 with mild left lateral recess stenosis and mild to moderate left foraminal stenosis. 3. Moderate to severe facet degeneration at L5-S1 significantly contributes to moderate to severe left and mild right L5 foraminal stenosis.   NCS/EMG of the legs 05/29/2018:   1. The electrophysiologic findings are most consistent with a chronic sensorimotor axonal polyneuropathy affecting the lower extremities.   2. There is also evidence of a superimposed bilateral and chronic chronic S1 radiculopathy, which is mild in degree electrically  Lab Results  Component Value Date   HGBA1C 5.8 08/19/2018   Lab Results  Component Value Date   VITAMINB12 387 05/05/2018   Lab Results  Component Value Date   TSH 1.97 08/19/2018   Lab Results  Component Value Date   ESRSEDRATE 31 (H) 10/15/2013    Past Medical History:  Diagnosis Date  . ALLERGIC RHINITIS 04/09/2007  . ASTHMA 12/04/2007  . BACK PAIN 09/16/2009  . BENIGN PROSTATIC HYPERTROPHY 04/09/2007  . CEREBROVASCULAR ACCIDENT, HX OF 07/17/2010  . COLONIC POLYPS, HX OF 12/04/2007  . CONSTIPATION 09/25/2010  . Degeneration of cervical intervertebral disc 03/28/2007  . DEPRESSION 12/04/2007  . DIVERTICULOSIS, COLON 12/04/2007  . DYSPHAGIA UNSPECIFIED 03/16/2008  . ERECTILE DYSFUNCTION 04/09/2007  . ESOPHAGEAL STRICTURE 05/26/2008  . GERD 12/04/2007  . HEMORRHOIDS, RECURRENT 02/11/2008  . HYPERLIPIDEMIA 12/04/2007  . HYPERTENSION 03/28/2007  . LEG PAIN, BILATERAL 01/25/2010  . LOW BACK PAIN 04/09/2007  . LUNG NODULE 12/04/2007  . Osteoarth NOS-Unspec 03/28/2007  . Other dysphagia 11/21/2009  . Peripheral neuropathy 05/29/2018  . POLYARTHRALGIA 07/13/2008  . Polymyalgia rheumatica ([place]) 07/26/2008  . PVD WITH CLAUDICATION 02/10/2010  . RENAL INSUFFICIENCY 03/15/2010  . SLEEP APNEA, OBSTRUCTIVE, MODERATE 04/09/2007  . TB SKIN TEST, POSITIVE 03/28/2007    Past Surgical History:  Procedure Laterality Date  . ROTATOR CUFF REPAIR  Left  . TOTAL KNEE ARTHROPLASTY     x 2     Medications:  Outpatient Encounter Medications as of 01/19/2019  Medication Sig  . acetaminophen (TYLENOL 8 HOUR) 650 MG CR tablet Take 650 mg by mouth every 6 (six) hours as needed (pain/headache).  Marland Kitchen allopurinol  (ZYLOPRIM) 100 MG tablet Take 1 tablet (100 mg total) by mouth daily.  Marland Kitchen amLODipine (NORVASC) 5 MG tablet TAKE 1 TABLET(5 MG) BY MOUTH DAILY  . atorvastatin (LIPITOR) 40 MG tablet TAKE 1 TABLET(40 MG) BY MOUTH DAILY  . colchicine 0.6 MG tablet Take 1 tablet (0.6 mg total) by mouth 2 (two) times daily.  . furosemide (LASIX) 40 MG tablet take 1 tablet by mouth every morning AND 1 IN THE EVENING AS NEEDED FOR SWELLING ONLY  . nebivolol (BYSTOLIC) 10 MG tablet TAKE 2 TABLETS(20 MG) BY MOUTH DAILY  . pantoprazole (PROTONIX) 40 MG tablet Take 1 tablet (40 mg total) by mouth daily.  . tamsulosin (FLOMAX) 0.4 MG CAPS capsule TAKE 1 CAPSULE BY MOUTH TWICE A DAY  . [DISCONTINUED] albuterol (PROVENTIL HFA;VENTOLIN HFA) 108 (90 Base) MCG/ACT inhaler Inhale 2 puffs into the lungs every 6 (six) hours as needed for wheezing or shortness of breath.  . [DISCONTINUED] azithromycin (ZITHROMAX Z-PAK) 250 MG tablet 2 tab by mouth day 1, then 1 per day  . [DISCONTINUED] azithromycin (ZITHROMAX Z-PAK) 250 MG tablet 2 tab by mouth day 1, then 1 per day  . [DISCONTINUED] benzonatate (TESSALON) 200 MG capsule Take 1 capsule (200 mg total) by mouth 3 (three) times daily as needed for cough.  . [DISCONTINUED] gabapentin (NEURONTIN) 300 MG capsule Take 1 capsule (300 mg total) by mouth 3 (three) times daily.  . [DISCONTINUED] HYDROcodone-acetaminophen (NORCO) 7.5-325 MG tablet Take 1 tablet by mouth every 6 (six) hours as needed for moderate pain.  . [DISCONTINUED] predniSONE (DELTASONE) 10 MG tablet 3 tabs by mouth per day for 3 days,2tabs per day for 3 days,1tab per day for 3 days   No facility-administered encounter medications on file as of 01/19/2019.     Allergies:  Allergies  Allergen Reactions  . Ace Inhibitors Swelling    Angioedema throat  . Augmentin [Amoxicillin-Pot Clavulanate] Other (See Comments)    Marked weakness with po med x 3 doses  . Doxycycline     Some GI upset  . Sulfa Antibiotics Hives    And  facial angioedema    Family History: Family History  Problem Relation Age of Onset  . Heart disease Father   . Lupus Brother   . Hypertension Brother   . Asthma Other     Social History: Social History   Tobacco Use  . Smoking status: Never Smoker  . Smokeless tobacco: Never Used  Substance Use Topics  . Alcohol use: No  . Drug use: No   Social History   Social History Narrative   Patient does not get regular exercise.   6 children - 1 died with suicide, 1 with homicide      Lives with granddaugter and great grandson      Pt does have steps in the home      Highest level of education 12th grade      Disabled      Left handed    Review of Systems:  CONSTITUTIONAL: No fevers, chills, night sweats, or weight loss.   EYES: No visual changes or eye pain ENT: No hearing changes.  No history of nose bleeds.   RESPIRATORY:  No cough, wheezing and shortness of breath.   CARDIOVASCULAR: Negative for chest pain, and palpitations.   GI: Negative for abdominal discomfort, blood in stools or black stools.  No recent change in bowel habits.   GU:  No history of incontinence.   MUSCLOSKELETAL: No history of joint pain +swelling.  No myalgias.   SKIN: Negative for lesions, rash, and itching.   HEMATOLOGY/ONCOLOGY: Negative for prolonged bleeding, bruising easily, and swollen nodes.  No history of cancer.   ENDOCRINE: Negative for cold or heat intolerance, polydipsia or goiter.   PSYCH:  No depression or anxiety symptoms.   NEURO: As Above.   Vital Signs:  BP 128/64   Pulse 62   Ht 5' 9.5" (1.765 m)   Wt 290 lb (131.5 kg)   SpO2 98%   BMI 42.21 kg/m    General Medical Exam:   General:  Well appearing, comfortable.   Eyes/ENT: see cranial nerve examination.   Neck:   No carotid bruits. Respiratory:  Clear to auscultation, good air entry bilaterally.   Cardiac:  Regular rate and rhythm, no murmur.   Extremities:  Mild pitting edema of the feet/ankles.  No skin  discoloration.  Skin:  No rashes or lesions.  Neurological Exam: MENTAL STATUS including orientation to time, place, person, recent and remote memory, attention span and concentration, language, and fund of knowledge is normal.  Speech is not dysarthric.  CRANIAL NERVES: II:  No visual field defects.  Unremarkable fundi.   III-IV-VI: Pupils equal round and reactive to light.  Normal conjugate, extra-ocular eye movements in all directions of gaze.  No nystagmus.  No ptosis.   V:  Normal facial sensation.    VII:  Normal facial symmetry and movements.   VIII:  Normal hearing and vestibular function.   IX-X:  Normal palatal movement.   XI:  Normal shoulder shrug and head rotation.   XII:  Normal tongue strength and range of motion, no deviation or fasciculation.  MOTOR:  No atrophy, fasciculations or abnormal movements.  No pronator drift.   Upper Extremity:  Right  Left  Deltoid  5/5   5/5   Biceps  5/5   5/5   Triceps  5/5   5/5   Infraspinatus 5/5  5/5  Medial pectoralis 5/5  5/5  Wrist extensors  5/5   5/5   Wrist flexors  5/5   5/5   Finger extensors  5/5   5/5   Finger flexors  5/5   5/5   Dorsal interossei  5/5   5/5   Abductor pollicis  5/5   5/5   Tone (Ashworth scale)  0  0   Lower Extremity:  Right  Left  Hip flexors  5/5   5/5   Hip extensors  5/5   5/5   Adductor 5/5  5/5  Abductor 5/5  5/5  Knee flexors  5/5   5/5   Knee extensors  5/5   5/5   Dorsiflexors  5/5   5/5   Plantarflexors  5/5   5/5   Toe extensors  5/5   5/5   Toe flexors  5/5   5/5   Tone (Ashworth scale)  0  0   MSRs:  Right        Left                  brachioradialis 2+  2+  biceps 2+  2+  triceps 2+  2+  patellar 2+  2+  ankle jerk 0  0  Hoffman no  no  plantar response down  down   SENSORY: Vibration is reduced to 50% at the ankles and 10-20% at the great toe bilaterally.  Pin prick and temperature reduced in the feet.  Romberg's sign absent.   COORDINATION/GAIT: Normal finger-to-  nose-finger and heel-to-shin.  Intact rapid alternating movements bilaterally. Gait is wide-based, unassisted and stable.  Mild unsteadiness with tandem and stressed gait, but able to perform.    IMPRESSION: Peripheral neuropathy, contributed by peripheral vascular disease.  He does not have diabetes, alcohol history, or family history of neuropathy.  I had extensive discussion with the patient regarding the pathogenesis, etiology, management, and natural course of neuropathy. Neuropathy tends to be slowly progressive  I would like to test for treatable causes of neuropathy. I discussed that in the vast majority of cases, despite checking for reversible causes, management is symptomatic.     PLAN/RECOMMENDATIONS:  Check vitamin B12, B1, folate, copper, SPEP with IFE Arterial studies of the legs to look for interval change - last checked in 2013 Patient educated on daily foot inspection Fall precautions discussed, use a cane on uneven ground   Return to clinic in 6 months.  Greater than 50% of this 40 minute visit was spent in counseling, explanation of diagnosis, planning of further management, and coordination of care.    Thank you for allowing me to participate in patient's care.  If I can answer any additional questions, I would be pleased to do so.    Sincerely,    Ayva Veilleux K. Posey Pronto, DO

## 2019-01-19 NOTE — Patient Instructions (Addendum)
Check labs  Arterial studies of the legs  Daily foot inspection  Take extra caution when walking on uneven ground.  Use a cane as needed.   Return to clinic 6 months

## 2019-01-19 NOTE — Telephone Encounter (Signed)
Left message for [facility] to return call.  Need to schedule doppler for PVD.

## 2019-01-20 ENCOUNTER — Other Ambulatory Visit: Payer: Self-pay

## 2019-01-20 DIAGNOSIS — G63 Polyneuropathy in diseases classified elsewhere: Secondary | ICD-10-CM

## 2019-01-20 DIAGNOSIS — R200 Anesthesia of skin: Secondary | ICD-10-CM

## 2019-01-20 LAB — B12 AND FOLATE PANEL
Folate: 8.2 ng/mL
Vitamin B-12: 393 pg/mL (ref 200–1100)

## 2019-01-20 NOTE — Telephone Encounter (Signed)
Spoke with Starla Link at [facility]. States that [employer] does not perform the procedure that Dr. Posey Pronto needs. She states that I need to put in a different order.  A VAS [id number] ordered.  Reason for exam: claudication.  Appt scheduled for 01/23/19 at 2. Pt needs to arrive at 1:45 at the admissions entrance of [facility] hospital.  Pt informed. [phone] given for directions

## 2019-01-23 ENCOUNTER — Encounter (HOSPITAL_COMMUNITY): Payer: Medicare Other

## 2019-01-23 ENCOUNTER — Ambulatory Visit (HOSPITAL_COMMUNITY): Payer: Medicare Other | Attending: Neurology

## 2019-01-24 LAB — PROTEIN ELECTROPHORESIS, SERUM
Albumin ELP: 3.2 g/dL — ABNORMAL LOW (ref 3.8–4.8)
Alpha 1: 0.3 g/dL (ref 0.2–0.3)
Alpha 2: 0.7 g/dL (ref 0.5–0.9)
Beta 2: 0.5 g/dL (ref 0.2–0.5)
Beta Globulin: 0.3 g/dL — ABNORMAL LOW (ref 0.4–0.6)
Gamma Globulin: 1.4 g/dL (ref 0.8–1.7)
Total Protein: 6.4 g/dL (ref 6.1–8.1)

## 2019-01-24 LAB — IMMUNOFIXATION ELECTROPHORESIS
IgG (Immunoglobin G), Serum: 1530 mg/dL (ref 600–1540)
IgM, Serum: 80 mg/dL (ref 50–300)
Immunofix Electr Int: NOT DETECTED
Immunoglobulin A: 253 mg/dL (ref 70–320)

## 2019-01-24 LAB — VITAMIN B1: Vitamin B1 (Thiamine): 7 nmol/L — ABNORMAL LOW (ref 8–30)

## 2019-01-24 LAB — COPPER, SERUM: Copper: 103 ug/dL (ref 70–175)

## 2019-02-02 ENCOUNTER — Ambulatory Visit: Payer: Medicare Other | Admitting: Neurology

## 2019-02-02 ENCOUNTER — Other Ambulatory Visit: Payer: Self-pay

## 2019-02-02 MED ORDER — TAMSULOSIN HCL 0.4 MG PO CAPS: 0.4000 mg | ORAL_CAPSULE | Freq: Two times a day (BID) | ORAL | 1 refills | Status: DC

## 2019-02-17 ENCOUNTER — Other Ambulatory Visit: Payer: Self-pay

## 2019-02-17 ENCOUNTER — Emergency Department (HOSPITAL_COMMUNITY): Payer: Medicare Other

## 2019-02-17 ENCOUNTER — Ambulatory Visit: Payer: Medicare Other | Admitting: Internal Medicine

## 2019-02-17 ENCOUNTER — Encounter (HOSPITAL_COMMUNITY): Payer: Self-pay | Admitting: Student

## 2019-02-17 ENCOUNTER — Emergency Department (HOSPITAL_COMMUNITY)
Admission: EM | Admit: 2019-02-17 | Discharge: 2019-02-17 | Disposition: A | Discharge status: Home / Self Care | Payer: Medicare Other | Attending: Emergency Medicine | Admitting: Emergency Medicine

## 2019-02-17 DIAGNOSIS — S59902A Unspecified injury of left elbow, initial encounter: Secondary | ICD-10-CM | POA: Diagnosis not present

## 2019-02-17 DIAGNOSIS — M79642 Pain in left hand: Secondary | ICD-10-CM | POA: Insufficient documentation

## 2019-02-17 DIAGNOSIS — M25522 Pain in left elbow: Secondary | ICD-10-CM | POA: Diagnosis not present

## 2019-02-17 DIAGNOSIS — M25512 Pain in left shoulder: Secondary | ICD-10-CM | POA: Insufficient documentation

## 2019-02-17 DIAGNOSIS — S4992XA Unspecified injury of left shoulder and upper arm, initial encounter: Secondary | ICD-10-CM | POA: Diagnosis not present

## 2019-02-17 DIAGNOSIS — M542 Cervicalgia: Secondary | ICD-10-CM | POA: Diagnosis not present

## 2019-02-17 MED ORDER — LIDOCAINE 5 % EX PTCH
1.0000 | MEDICATED_PATCH | CUTANEOUS | Status: DC
  Administered 2019-02-17: 1 via TRANSDERMAL
  Filled 2019-02-17: qty 1

## 2019-02-17 MED ORDER — ACETAMINOPHEN 325 MG PO TABS
650.0000 mg | ORAL_TABLET | Freq: Once | ORAL | Status: AC
  Administered 2019-02-17: 650 mg via ORAL
  Filled 2019-02-17: qty 2

## 2019-02-17 MED ORDER — LIDOCAINE 5 % EX PTCH: 1.0000 | MEDICATED_PATCH | CUTANEOUS | 0 refills | Status: DC
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
## 2019-02-17 NOTE — ED Notes (Signed)
Patient verbalizes understanding of discharge instructions. Opportunity for questioning and answers were provided. Armband removed by staff, pt discharged from ED.  
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
## 2019-02-17 NOTE — ED Notes (Signed)
c-collar applied  

## 2019-02-20 ENCOUNTER — Other Ambulatory Visit: Payer: Self-pay
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
## 2019-02-21 ENCOUNTER — Other Ambulatory Visit: Payer: Self-pay | Admitting: Internal Medicine

## 2019-02-23 ENCOUNTER — Ambulatory Visit (INDEPENDENT_AMBULATORY_CARE_PROVIDER_SITE_OTHER): Payer: Medicare Other | Admitting: Internal Medicine

## 2019-02-23 ENCOUNTER — Other Ambulatory Visit: Payer: Self-pay

## 2019-02-23 ENCOUNTER — Encounter: Payer: Self-pay | Admitting: Internal Medicine

## 2019-02-23 VITALS — BP 122/74 | HR 67 | Temp 98.7°F | Ht 69.5 in | Wt 298.0 lb

## 2019-02-23 DIAGNOSIS — R7302 Impaired glucose tolerance (oral): Secondary | ICD-10-CM

## 2019-02-23 DIAGNOSIS — Z0000 Encounter for general adult medical examination without abnormal findings: Secondary | ICD-10-CM

## 2019-02-23 DIAGNOSIS — J309 Allergic rhinitis, unspecified: Secondary | ICD-10-CM | POA: Diagnosis not present

## 2019-02-23 DIAGNOSIS — M542 Cervicalgia: Secondary | ICD-10-CM | POA: Diagnosis not present

## 2019-02-23 DIAGNOSIS — E559 Vitamin D deficiency, unspecified: Secondary | ICD-10-CM

## 2019-02-23 DIAGNOSIS — E538 Deficiency of other specified B group vitamins: Secondary | ICD-10-CM

## 2019-02-23 DIAGNOSIS — S4382XA Sprain of other specified parts of left shoulder girdle, initial encounter: Secondary | ICD-10-CM

## 2019-02-23 DIAGNOSIS — I10 Essential (primary) hypertension: Secondary | ICD-10-CM

## 2019-02-23 DIAGNOSIS — E611 Iron deficiency: Secondary | ICD-10-CM

## 2019-02-23 MED ORDER — HYDROCODONE-ACETAMINOPHEN 7.5-325 MG PO TABS: 1.0000 | ORAL_TABLET | Freq: Four times a day (QID) | ORAL | 0 refills | Status: DC | PRN

## 2019-02-23 MED ORDER — TIZANIDINE HCL 2 MG PO TABS: 2.0000 mg | ORAL_TABLET | Freq: Four times a day (QID) | ORAL | 1 refills | Status: DC | PRN

## 2019-02-23 MED ORDER — PREDNISONE 10 MG PO TABS: ORAL_TABLET | ORAL | 0 refills | Status: DC

## 2019-02-23 NOTE — Assessment & Plan Note (Signed)
stable overall by history and exam, recent data reviewed with pt, and pt to continue medical treatment as before,  to f/u any worsening symptoms or concerns  
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
## 2019-02-23 NOTE — Patient Instructions (Addendum)
Please take all new medication as prescribed - the pain medication, muscle relaxer and prednisone  You will be contacted regarding the referral for: Physical Therapy  Please continue all other medications as before, and refills have been done if requested.  Please have the pharmacy call with any other refills you may need.  Please continue your efforts at being more active, low cholesterol diet, and weight control.  You are otherwise up to date with prevention measures today.  Please keep your appointments with your specialists as you may have planned  Your appt on Friday can be cancelled  Please return in 6 months, or sooner if needed, with Lab testing done 3-5 days before

## 2019-02-23 NOTE — Assessment & Plan Note (Signed)
Mild to mod, for predpac asd,,  to f/u any worsening symptoms or concerns 
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
## 2019-02-26 ENCOUNTER — Encounter: Payer: Self-pay | Admitting: Physical Therapy

## 2019-02-26 ENCOUNTER — Ambulatory Visit: Payer: Medicare Other | Attending: Internal Medicine | Admitting: Physical Therapy

## 2019-02-26 ENCOUNTER — Other Ambulatory Visit: Payer: Self-pay

## 2019-02-26 DIAGNOSIS — M542 Cervicalgia: Secondary | ICD-10-CM | POA: Insufficient documentation

## 2019-02-26 DIAGNOSIS — M25512 Pain in left shoulder: Secondary | ICD-10-CM | POA: Insufficient documentation

## 2019-02-26 DIAGNOSIS — M6281 Muscle weakness (generalized): Secondary | ICD-10-CM | POA: Insufficient documentation
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
## 2019-02-27 ENCOUNTER — Ambulatory Visit: Payer: Medicare Other | Admitting: Internal Medicine

## 2019-03-03 ENCOUNTER — Telehealth: Payer: Self-pay | Admitting: Physical Therapy

## 2019-03-03 ENCOUNTER — Ambulatory Visit: Payer: Medicare Other | Admitting: Physical Therapy
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
## 2019-03-08 ENCOUNTER — Other Ambulatory Visit: Payer: Self-pay | Admitting: Internal Medicine

## 2019-03-17 ENCOUNTER — Encounter: Payer: Medicare Other | Admitting: Physical Therapy

## 2019-03-19 ENCOUNTER — Encounter: Payer: Medicare Other | Admitting: Physical Therapy

## 2019-03-24 ENCOUNTER — Encounter: Payer: Medicare Other | Admitting: Physical Therapy

## 2019-03-26 ENCOUNTER — Encounter: Payer: Medicare Other | Admitting: Physical Therapy

## 2019-03-31 ENCOUNTER — Encounter: Payer: Medicare Other | Admitting: Physical Therapy

## 2019-04-02 ENCOUNTER — Encounter: Payer: Medicare Other | Admitting: Physical Therapy

## 2019-04-03 DIAGNOSIS — H2513 Age-related nuclear cataract, bilateral: Secondary | ICD-10-CM | POA: Diagnosis not present

## 2019-04-12 IMAGING — CT CT PARANASAL SINUSES LIMITED
1 of 2 series · 8 of 11 positions shown, 10 images · non-contrast
Comparison: None.

CLINICAL DATA: Persistent sinus congestion and drainage.

EXAM:
CT PARANASAL SINUS LIMITED WITHOUT CONTRAST
TECHNIQUE: Non-contiguous multidetector CT images of the paranasal sinuses were
obtained in a single plane without contrast.

[Series 4: limited sinus st · axial · 0.29mm/px · z∈[+115,+185]mm · 8 of 10 slices shown, 10 images]
[im 2/10  brain]
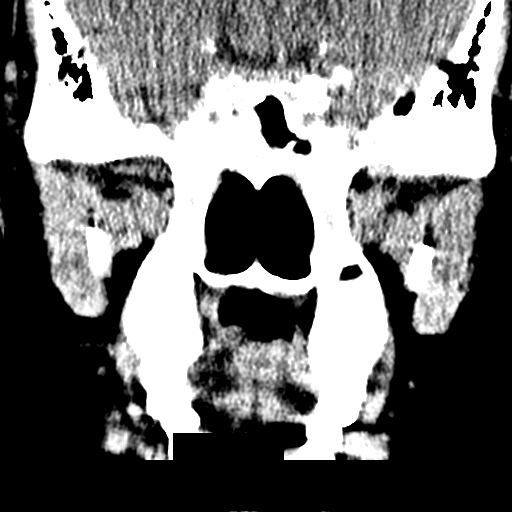
[im 2/10  bone]
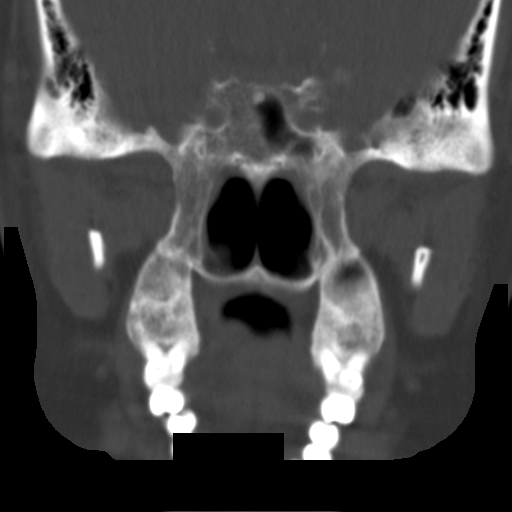
[im 3/10  bone]
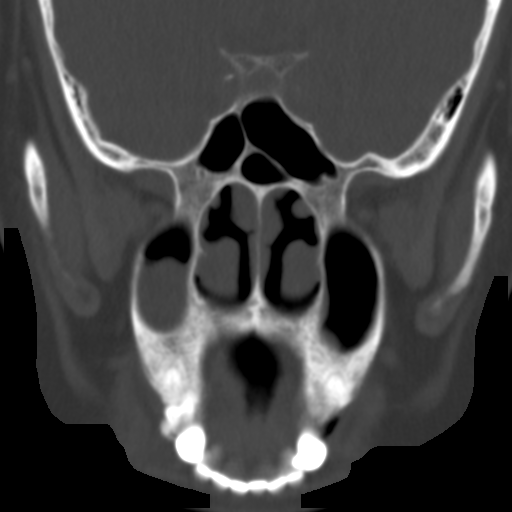
[im 4/10  bone]
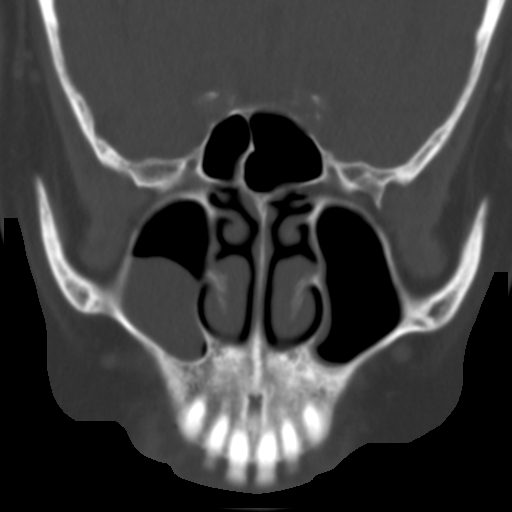
[im 5/10  bone]
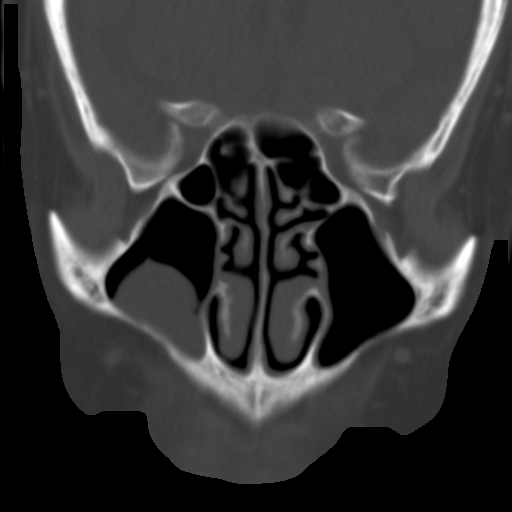
[im 6/10  brain]
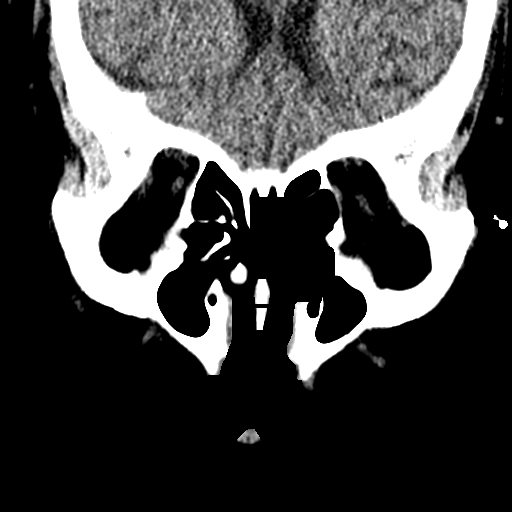
[im 6/10  bone]
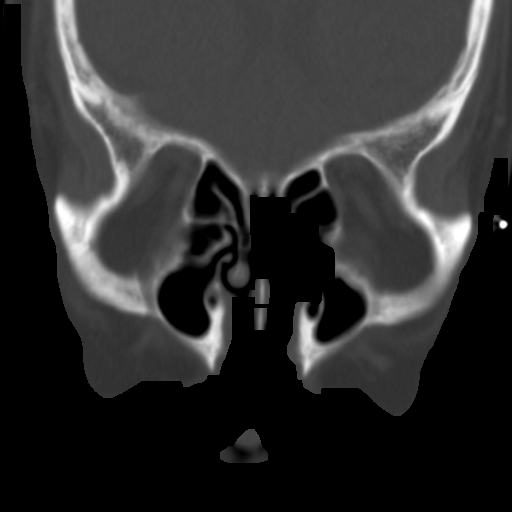
[im 7/10  bone]
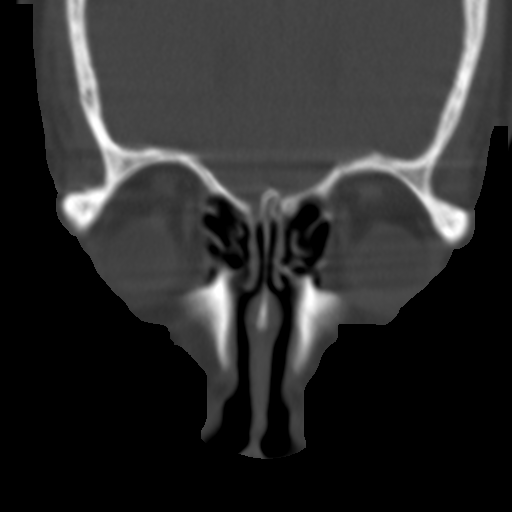
[im 8/10  bone]
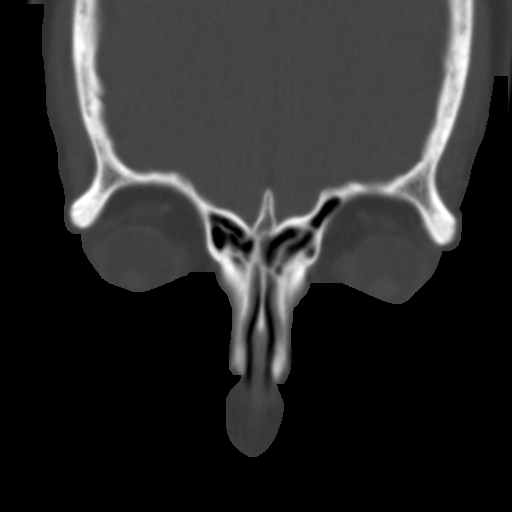
[im 9/10  bone]
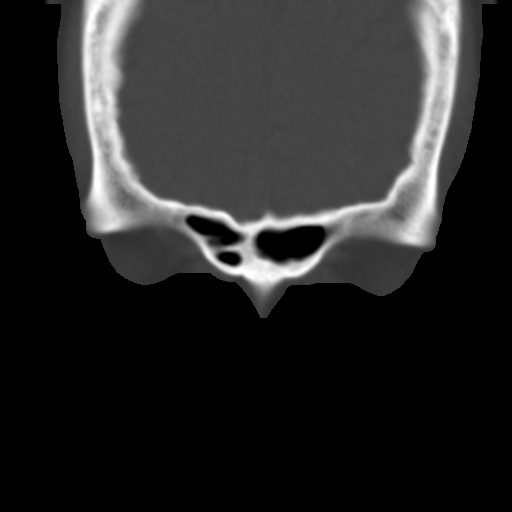

[8 of 11 positions shown; findings below may reference images not displayed]

FINDINGS: The sphenoid, frontal, and ethmoid sinuses are clear. There is
slight mucosal thickening in the RIGHT maxillary sinus. There is a
moderate size retention cyst in the LEFT maxillary sinus. Nasal
passages appear patent. Nasal septum is essentially midline.
Visualized intracranial compartment and orbits are unremarkable.
IMPRESSION: Chronic BILATERAL maxillary sinusitis, greater on the LEFT

## 2019-05-25 ENCOUNTER — Other Ambulatory Visit: Payer: Self-pay | Admitting: Internal Medicine

## 2019-05-25 MED ORDER — NEBIVOLOL HCL 10 MG PO TABS: ORAL_TABLET | ORAL | 1 refills | Status: DC

## 2019-05-25 NOTE — Telephone Encounter (Signed)
Medication Refill - Medication: nebivolol (BYSTOLIC) 10 MG tablet    Preferred Pharmacy (with phone number or street name):  [facility] [phone] - [address], [address]  [place] [address]  Phone: [phone] Fax: [phone]     Agent: Please be advised that RX refills may take up to 3 business days. We ask that you follow-up with your pharmacy.

## 2019-05-27 ENCOUNTER — Telehealth: Payer: Self-pay | Admitting: Internal Medicine

## 2019-05-27 MED ORDER — BYSTOLIC 20 MG PO TABS: ORAL_TABLET | ORAL | 3 refills | Status: DC

## 2019-05-27 NOTE — Telephone Encounter (Signed)
Copied from [place] [phone]. Topic: General - Other >> May 27, 2019  1:48 PM Keene Breath wrote: Reason for CRM: Patient called to inform the doctor that his medication for nebivolol (BYSTOLIC) 10 MG tablet was sent to the pharmacy incorrectly.  It should be for 20 MG so that his insurance will cover it.  He stated that he does not know why it was changed and would like it resent to the pharmacy for 20 MB.  Please advise and call patient to discuss.  CB# [phone]

## 2019-05-27 NOTE — Telephone Encounter (Signed)
It was likely changed after we were informed the 20 mg was not in stock at one point  I have change to the 20 mg pill

## 2019-05-27 NOTE — Addendum Note (Signed)
Addended by: Biagio Borg on: 05/27/2019 02:14 PM   Modules accepted: Orders

## 2019-05-28 ENCOUNTER — Ambulatory Visit: Payer: Medicare Other | Admitting: Internal Medicine

## 2019-06-04 ENCOUNTER — Encounter: Payer: Self-pay | Admitting: Internal Medicine

## 2019-06-04 ENCOUNTER — Ambulatory Visit (INDEPENDENT_AMBULATORY_CARE_PROVIDER_SITE_OTHER)
Admission: RE | Admit: 2019-06-04 | Discharge: 2019-06-04 | Disposition: A | Discharge status: Home / Self Care | Payer: Medicare Other | Source: Ambulatory Visit | Attending: Internal Medicine | Admitting: Internal Medicine

## 2019-06-04 ENCOUNTER — Ambulatory Visit (INDEPENDENT_AMBULATORY_CARE_PROVIDER_SITE_OTHER): Payer: Medicare Other | Admitting: Internal Medicine

## 2019-06-04 ENCOUNTER — Other Ambulatory Visit: Payer: Self-pay

## 2019-06-04 VITALS — BP 154/60 | HR 62 | Temp 98.4°F | Ht 69.5 in | Wt 299.0 lb

## 2019-06-04 DIAGNOSIS — M25462 Effusion, left knee: Secondary | ICD-10-CM | POA: Diagnosis not present

## 2019-06-04 DIAGNOSIS — R7302 Impaired glucose tolerance (oral): Secondary | ICD-10-CM | POA: Diagnosis not present

## 2019-06-04 DIAGNOSIS — M25562 Pain in left knee: Secondary | ICD-10-CM

## 2019-06-04 DIAGNOSIS — Z23 Encounter for immunization: Secondary | ICD-10-CM | POA: Diagnosis not present

## 2019-06-04 DIAGNOSIS — I10 Essential (primary) hypertension: Secondary | ICD-10-CM | POA: Diagnosis not present

## 2019-06-04 DIAGNOSIS — E782 Mixed hyperlipidemia: Secondary | ICD-10-CM | POA: Diagnosis not present

## 2019-06-04 MED ORDER — TRAMADOL HCL 50 MG PO TABS: 50.0000 mg | ORAL_TABLET | Freq: Four times a day (QID) | ORAL | 0 refills | Status: DC | PRN

## 2019-06-04 NOTE — Assessment & Plan Note (Signed)
stable overall by history and exam, recent data reviewed with pt, and pt to continue medical treatment as before,  to f/u any worsening symptoms or concerns  

## 2019-06-04 NOTE — Assessment & Plan Note (Signed)
Etiology unclear, for tramadol prn, plain films and refer Dr Smith/sport med

## 2019-06-04 NOTE — Patient Instructions (Addendum)
Please take all new medication as prescribed - the pain medication  Please continue all other medications as before, and refills have been done if requested.  Please have the pharmacy call with any other refills you may need.  Please continue your efforts at being more active, low cholesterol diet, and weight control.  Please keep your appointments with your specialists as you may have planned  Please go to the XRAY Department in the Basement (go straight as you get off the elevator) for the x-ray testing  You will be contacted regarding the referral for: Dr Tamala Julian  - Sports medicine in this office  Please return in 3 months, or sooner if needed, with Lab testing done 3-5 days before

## 2019-06-04 NOTE — Assessment & Plan Note (Signed)
declines med change, for lower chol diet

## 2019-06-04 NOTE — Progress Notes (Signed)
Subjective:    Patient ID: Troy Palmer, male    DOB: 05-05-1946, 73 y.o.   MRN: [id number]  HPI  Here to f/u; overall doing ok,  Pt denies chest pain, increasing sob or doe, wheezing, orthopnea, PND, increased LE swelling, palpitations, dizziness or syncope.  Pt denies new neurological symptoms such as new headache, or facial or extremity weakness or numbness.  Pt denies polydipsia, polyuria, or low sugar episode.  Pt states overall good compliance with meds, mostly trying to follow appropriate diet, with wt overall stable,  but little exercise however.  BP at home < [id number]  Also with 2 wks left knee pain, mod to severe, intermittent, dull, worse to try to stand up, walking is stiff, better to sit.  Tends to want to giveaway but no falls yet, holds on to the furniture for support at home.  S/p right knee surgury x 3, and also left knee surgury x 1. Also with some mild swelling.  No fever, trauma, Has had hx of gout.  Past Medical History:  Diagnosis Date  . ALLERGIC RHINITIS 04/09/2007  . ASTHMA 12/04/2007  . BACK PAIN 09/16/2009  . BENIGN PROSTATIC HYPERTROPHY 04/09/2007  . CEREBROVASCULAR ACCIDENT, HX OF 07/17/2010  . COLONIC POLYPS, HX OF 12/04/2007  . CONSTIPATION 09/25/2010  . Degeneration of cervical intervertebral disc 03/28/2007  . DEPRESSION 12/04/2007  . DIVERTICULOSIS, COLON 12/04/2007  . DYSPHAGIA UNSPECIFIED 03/16/2008  . ERECTILE DYSFUNCTION 04/09/2007  . ESOPHAGEAL STRICTURE 05/26/2008  . GERD 12/04/2007  . HEMORRHOIDS, RECURRENT 02/11/2008  . HYPERLIPIDEMIA 12/04/2007  . HYPERTENSION 03/28/2007  . LEG PAIN, BILATERAL 01/25/2010  . LOW BACK PAIN 04/09/2007  . LUNG NODULE 12/04/2007  . Osteoarth NOS-Unspec 03/28/2007  . Other dysphagia 11/21/2009  . Peripheral neuropathy 05/29/2018  . POLYARTHRALGIA 07/13/2008  . Polymyalgia rheumatica ([place]) 07/26/2008  . PVD WITH CLAUDICATION 02/10/2010  . RENAL INSUFFICIENCY 03/15/2010  . SLEEP APNEA, OBSTRUCTIVE, MODERATE 04/09/2007  . TB SKIN TEST,  POSITIVE 03/28/2007   Past Surgical History:  Procedure Laterality Date  . ROTATOR CUFF REPAIR     Left  . TOTAL KNEE ARTHROPLASTY     x 2    reports that he has never smoked. He has never used smokeless tobacco. He reports that he does not drink alcohol or use drugs. family history includes Asthma in an other family member; Heart disease in his father; Hypertension in his brother; Lupus in his brother. Allergies  Allergen Reactions  . Ace Inhibitors Swelling    Angioedema throat  . Augmentin [Amoxicillin-Pot Clavulanate] Other (See Comments)    Marked weakness with po med x 3 doses  . Doxycycline     Some GI upset  . Sulfa Antibiotics Hives    And facial angioedema   Current Outpatient Medications on File Prior to Visit  Medication Sig Dispense Refill  . acetaminophen (TYLENOL 8 HOUR) 650 MG CR tablet Take 650 mg by mouth every 6 (six) hours as needed (pain/headache).    Marland Kitchen allopurinol (ZYLOPRIM) 100 MG tablet TAKE 1 TABLET(100 MG) BY MOUTH DAILY 90 tablet 0  . amLODipine (NORVASC) 5 MG tablet TAKE 1 TABLET(5 MG) BY MOUTH DAILY 30 tablet 5  . atorvastatin (LIPITOR) 40 MG tablet TAKE 1 TABLET(40 MG) BY MOUTH DAILY 90 tablet 1  . colchicine 0.6 MG tablet Take 1 tablet (0.6 mg total) by mouth 2 (two) times daily. 60 tablet 5  . furosemide (LASIX) 40 MG tablet TAKE 1 TABLET BY MOUTH EVERY MORNING AND 1 TABLET IN  THE EVENING AS NEEDED FOR SWELLING ONLY 60 tablet 5  . HYDROcodone-acetaminophen (NORCO) 7.5-325 MG tablet Take 1 tablet by mouth every 6 (six) hours as needed for moderate pain. 30 tablet 0  . lidocaine (LIDODERM) 5 % Place 1 patch onto the skin daily. Apply patch to the left shoulder area. Remove & Discard patch within 12 hours or as directed by MD 14 patch 0  . Nebivolol HCl (BYSTOLIC) 20 MG TABS 1 tab by mouth daily 90 tablet 3  . pantoprazole (PROTONIX) 40 MG tablet Take 1 tablet (40 mg total) by mouth daily. 90 tablet 3  . predniSONE (DELTASONE) 10 MG tablet 3 tabs by mouth  per day for 3 days,2tabs per day for 3 days,1tab per day for 3 days 18 tablet 0  . tamsulosin (FLOMAX) 0.4 MG CAPS capsule Take 1 capsule (0.4 mg total) by mouth 2 (two) times daily. 180 capsule 1  . tiZANidine (ZANAFLEX) 2 MG tablet Take 1 tablet (2 mg total) by mouth every 6 (six) hours as needed for muscle spasms. 40 tablet 1   No current facility-administered medications on file prior to visit.    Review of Systems  Constitutional: Negative for other unusual diaphoresis or sweats HENT: Negative for ear discharge or swelling Eyes: Negative for other worsening visual disturbances Respiratory: Negative for stridor or other swelling  Gastrointestinal: Negative for worsening distension or other blood Genitourinary: Negative for retention or other urinary change Musculoskeletal: Negative for other MSK pain or swelling Skin: Negative for color change or other new lesions Neurological: Negative for worsening tremors and other numbness  Psychiatric/Behavioral: Negative for worsening agitation or other fatigue All otherwise neg per pt     Objective:   Physical Exam BP (!) 154/60 (BP Location: Left Arm, Patient Position: Sitting, Cuff Size: Large)   Pulse 62   Temp 98.4 F (36.9 C) (Oral)   Ht 5' 9.5" (1.765 m)   Wt 299 lb (135.6 kg)   SpO2 96%   BMI 43.52 kg/m  VS noted,  Constitutional: Pt appears in NAD HENT: Head: NCAT.  Right Ear: External ear normal.  Left Ear: External ear normal.  Eyes: . Pupils are equal, round, and reactive to light. Conjunctivae and EOM are normal Nose: without d/c or deformity Neck: Neck supple. Gross normal ROM Cardiovascular: Normal rate and regular rhythm.   Pulmonary/Chest: Effort normal and breath sounds without rales or wheezing.  Neurological: Pt is alert. At baseline orientation, motor grossly intact Skin: Skin is warm. No rashes, other new lesions, no LE edema Left knee with 1+ effusion , no crepitus Psychiatric: Pt behavior is normal without  agitation  All otherwise neg per pt Lab Results  Component Value Date   WBC 5.1 08/19/2018   HGB 13.0 08/19/2018   HCT 40.6 08/19/2018   PLT 208.0 08/19/2018   GLUCOSE 75 08/19/2018   CHOL 175 08/19/2018   TRIG 65.0 08/19/2018   HDL 52.60 08/19/2018   LDLDIRECT 156.9 10/10/2010   LDLCALC 110 (H) 08/19/2018   ALT 14 08/19/2018   AST 15 08/19/2018   NA 142 08/19/2018   K 5.1 08/19/2018   CL 106 08/19/2018   CREATININE 1.97 (H) 08/19/2018   BUN 29 (H) 08/19/2018   CO2 32 08/19/2018   TSH 1.97 08/19/2018   PSA 1.11 08/19/2018   HGBA1C 5.8 08/19/2018      Assessment & Plan:

## 2019-06-11 ENCOUNTER — Telehealth: Payer: Self-pay

## 2019-06-11 NOTE — Telephone Encounter (Signed)
Called pt, LVM.   Copied from [place] [phone]. Topic: General - Other >> Jun 10, 2019  2:38 PM Carolyn Stare wrote:  Pt call to say he would like a call back about his knee xray that he had last week

## 2019-06-15 ENCOUNTER — Other Ambulatory Visit: Payer: Self-pay

## 2019-06-15 ENCOUNTER — Telehealth: Payer: Self-pay | Admitting: Internal Medicine

## 2019-06-15 ENCOUNTER — Encounter: Payer: Self-pay | Admitting: Family Medicine

## 2019-06-15 ENCOUNTER — Ambulatory Visit: Payer: Medicare Other | Admitting: Family Medicine

## 2019-06-15 ENCOUNTER — Ambulatory Visit (INDEPENDENT_AMBULATORY_CARE_PROVIDER_SITE_OTHER): Payer: Medicare Other

## 2019-06-15 ENCOUNTER — Ambulatory Visit (INDEPENDENT_AMBULATORY_CARE_PROVIDER_SITE_OTHER): Payer: Medicare Other | Admitting: Family Medicine

## 2019-06-15 VITALS — BP 120/68 | HR 66 | Ht 69.5 in | Wt 292.0 lb

## 2019-06-15 DIAGNOSIS — M25562 Pain in left knee: Secondary | ICD-10-CM

## 2019-06-15 MED ORDER — DICLOFENAC SODIUM 1 % TD GEL: 4.0000 g | Freq: Four times a day (QID) | TRANSDERMAL | 12 refills | Status: DC

## 2019-06-15 MED ORDER — ATORVASTATIN CALCIUM 40 MG PO TABS: ORAL_TABLET | ORAL | 1 refills | Status: DC

## 2019-06-15 NOTE — Patient Instructions (Addendum)
Thank you for coming in today. Call or go to the ER if you develop a large red swollen joint with extreme pain or oozing puss.   Try the dicfenac gel.  Let me know if not getting better.    You had an injection today.  Things to be aware of after injection are listed below: . You may experience no significant improvement or even a slight worsening in your symptoms during the first 24 to 48 hours.  After that we expect your symptoms to improve gradually over the next 2 weeks for the medicine to have its maximal effect.  You should continue to have improvement out to 6 weeks after your injection. . Dr. Paulla Fore recommends icing the site of the injection for 20 minutes  1-2 times the day of your injection . You may shower but no swimming, tub bath or Jacuzzi for 24 hours. . If your bandage falls off this does not need to be replaced.  It is appropriate to remove the bandage after 4 hours. . You may resume light activities as tolerated unless otherwise directed per Dr. Paulla Fore during your visit  POSSIBLE STEROID SIDE EFFECTS:  Side effects from injectable steroids tend to be less than when taken orally however you may experience some of the symptoms listed below.  If experienced these should only last for a short period of time. Change in menstrual flow  Edema (swelling)  Increased appetite Skin flushing (redness)  Skin rash/acne  Thrush (oral) Yeast vaginitis    Increased sweating  Depression Increased blood glucose levels Cramping and leg/calf  Euphoria (feeling happy)  POSSIBLE PROCEDURE SIDE EFFECTS: The side effects of the injection are usually fairly minimal however if you may experience some of the following side effects that are usually self-limited and will is off on their own.  If you are concerned please feel free to call the office with questions:  Increased numbness or tingling  Nausea or vomiting  Swelling or bruising at the injection site   Please call our office if if you experience  any of the following symptoms over the next week as these can be signs of infection:   Fever greater than 100.52F  Significant swelling at the injection site  Significant redness or drainage from the injection site  If after 2 weeks you are continuing to have worsening symptoms please call our office to discuss what the next appropriate actions should be including the potential for a return office visit or other diagnostic testing.

## 2019-06-15 NOTE — Telephone Encounter (Signed)
pantoprazole (PROTONIX) 40 MG tablet  tamsulosin (FLOMAX) 0.4 MG CAPS capsule    Patient is requesting refill.    [facility] [phone] - [place], [place] - [address] [place] [phone] (Phone) [phone] (Fax)

## 2019-06-15 NOTE — Telephone Encounter (Signed)
Refill sent. See meds.  

## 2019-06-15 NOTE — Telephone Encounter (Signed)
rx refill atorvastatin (LIPITOR) 40 MG tablet PHARMACY [facility] [phone] - [place], [address] AT [address] [phone] (Phone) [phone] (Fax)

## 2019-06-15 NOTE — Progress Notes (Addendum)
Subjective:    I'm seeing this patient as a consultation for:  Dr. Jenny Reichmann  CC: L knee pain  I, Wendy Poet, LAT, ATC, am serving as scribe for Dr. Lynne Leader.  HPI: Pt is a 73 y/o male presenting w/ c/o L knee pain x 3 weeks.  Pt does not note any specific MOI but recalls pain upon waking one morning.  Pt reports pain and swelling in the L knee.  He saw Dr. Jenny Reichmann on 06/04/19 and had a L knee x-rays and was prescribed Tramadol.  Pt rates his pain as an 8/10 at it's worst and 5/10 at rest.  He describes the pain as sharp.  He notes some crepitations and mechanical symptoms.  His most dominant finding is pain with activity.  He does have a history of gout and says that his pain in his knee has been somewhat similar to gout that he is had in his foot.  He takes allopurinol daily for uric acid lowering.  He denies any injury.  No fevers or chills.   Past medical history, Surgical history, Family history not pertinant except as noted below, Social history, Allergies, and medications have been entered into the medical record, reviewed, and no changes needed.   Review of Systems: No headache, visual changes, nausea, vomiting, diarrhea, constipation, dizziness, abdominal pain, skin rash, fevers, chills, night sweats, weight loss, swollen lymph nodes, body aches, joint swelling, muscle aches, chest pain, shortness of breath, mood changes, visual or auditory hallucinations.   Objective:    Vitals:   06/15/19 1402  BP: 120/68  Pulse: 66  SpO2: 96%   General: Well Developed, well nourished, and in no acute distress.  Neuro/Psych: Alert and oriented x3, extra-ocular muscles intact, able to move all 4 extremities, sensation grossly intact. Skin: Warm and dry, no rashes noted.  Respiratory: Not using accessory muscles, speaking in full sentences, trachea midline.  Cardiovascular: Pulses palpable, no extremity edema. Abdomen: Does not appear distended. MSK: Left knee mature scar along superior lateral  aspect of knee.  Mild effusion present otherwise normal-appearing Range of motion 0-100 degrees with crepitations. Mildly tender palpation medial and lateral joint line and along anterior aspect of knee. Stable ligamentous exam. Intact flexion and extension strength.  Lab and Radiology Results   EXAM: LEFT KNEE - COMPLETE 4+ VIEW  COMPARISON:  None.  FINDINGS: No fracture or dislocation is seen.  The joint spaces are preserved.  The visualized soft tissues are unremarkable.  Small suprapatellar knee joint effusion.  IMPRESSION: No fracture or dislocation is seen.  Small suprapatellar knee joint effusion.   Electronically Signed   By: Julian Hy M.D.   On: 06/04/2019 18:53  I personally (independently) visualized and performed the interpretation of the images attached in this note.  Procedure: Real-time Ultrasound Guided Injection of left knee Device: GE Logiq E   Images permanently stored and available for review in the ultrasound unit. Verbal informed consent obtained.  Discussed risks and benefits of procedure. Warned about infection bleeding damage to structures skin hypopigmentation and fat atrophy among others. Patient expresses understanding and agreement Time-out conducted.   Noted no overlying erythema, induration, or other signs of local infection.   Skin prepped in a sterile fashion.   Local anesthesia: Topical Ethyl chloride.   With sterile technique and under real time ultrasound guidance:  40 mg of Kenalog and 4 mL of Marcaine injected easily.   Completed without difficulty   Pain immediately resolved suggesting accurate placement of the  medication.   Advised to call if fevers/chills, erythema, induration, drainage, or persistent bleeding.   Images permanently stored and available for review in the ultrasound unit.  Impression: Technically successful ultrasound guided injection.   Lab Results  Component Value Date   LABURIC 6.9  05/16/2017        Impression and Recommendations:    Assessment and Plan: 73 y.o. male with  Left knee pain and swelling.  Etiology somewhat unclear.  Could be exacerbation of underlying DJD versus gout.  Plan for ultrasound steroid injection as above.  Additionally use diclofenac gel.  Recheck if not improving.  Would proceed likely with further evaluation for possible gout including uric acid monitoring.Marland Kitchen  PDMP not reviewed this encounter. Orders Placed This Encounter  Procedures  . [place] LIMITED JOINT SPACE STRUCTURES LOW LEFT    Order Specific Question:   Reason for Exam (SYMPTOM  OR DIAGNOSIS REQUIRED)    Answer:   L knee pain    Order Specific Question:   Preferred imaging location?    Answer:   [place] ordered this encounter  Medications  . diclofenac sodium (VOLTAREN) 1 % GEL    Sig: Apply 4 g topically 4 (four) times daily.    Dispense:  100 g    Refill:  12    Discussed warning signs or symptoms. Please see discharge instructions. Patient expresses understanding.  The above documentation has been reviewed and is accurate and complete Lynne Leader

## 2019-06-16 ENCOUNTER — Other Ambulatory Visit: Payer: Self-pay

## 2019-06-16 MED ORDER — PANTOPRAZOLE SODIUM 40 MG PO TBEC: 40.0000 mg | DELAYED_RELEASE_TABLET | Freq: Every day | ORAL | 3 refills | Status: DC

## 2019-06-16 MED ORDER — TAMSULOSIN HCL 0.4 MG PO CAPS: 0.4000 mg | ORAL_CAPSULE | Freq: Two times a day (BID) | ORAL | 1 refills | Status: DC

## 2019-06-16 NOTE — Telephone Encounter (Signed)
Reviewed chart pt is up-to-date sent refills to [facility]  

## 2019-06-18 ENCOUNTER — Telehealth: Payer: Self-pay

## 2019-06-18 MED ORDER — AMLODIPINE BESYLATE 5 MG PO TABS: ORAL_TABLET | ORAL | 5 refills | Status: DC

## 2019-06-18 MED ORDER — ALLOPURINOL 100 MG PO TABS: ORAL_TABLET | ORAL | 1 refills | Status: DC

## 2019-06-18 NOTE — Telephone Encounter (Signed)
Copied from [place] [phone]. Topic: General - Other >> Jun 18, 2019  1:15 PM Rainey Pines A wrote: Patient would like a callback from nurse in regards to his medication request. Patient stated that pharmacy has reached out multiple times and that he was recently seen.

## 2019-06-22 ENCOUNTER — Ambulatory Visit: Payer: Medicare Other | Admitting: Family Medicine

## 2019-06-22 ENCOUNTER — Encounter: Payer: Self-pay | Admitting: Family Medicine

## 2019-06-22 ENCOUNTER — Ambulatory Visit (INDEPENDENT_AMBULATORY_CARE_PROVIDER_SITE_OTHER): Payer: Medicare Other | Admitting: Family Medicine

## 2019-06-22 ENCOUNTER — Ambulatory Visit (INDEPENDENT_AMBULATORY_CARE_PROVIDER_SITE_OTHER): Payer: Medicare Other

## 2019-06-22 ENCOUNTER — Other Ambulatory Visit: Payer: Self-pay

## 2019-06-22 VITALS — BP 120/66 | HR 99 | Ht 69.5 in | Wt 292.2 lb

## 2019-06-22 DIAGNOSIS — M25462 Effusion, left knee: Secondary | ICD-10-CM | POA: Diagnosis not present

## 2019-06-22 DIAGNOSIS — Z5181 Encounter for therapeutic drug level monitoring: Secondary | ICD-10-CM

## 2019-06-22 DIAGNOSIS — M25562 Pain in left knee: Secondary | ICD-10-CM | POA: Diagnosis not present

## 2019-06-22 DIAGNOSIS — Z471 Aftercare following joint replacement surgery: Secondary | ICD-10-CM | POA: Diagnosis not present

## 2019-06-22 DIAGNOSIS — M109 Gout, unspecified: Secondary | ICD-10-CM | POA: Diagnosis not present

## 2019-06-22 DIAGNOSIS — Z96651 Presence of right artificial knee joint: Secondary | ICD-10-CM | POA: Diagnosis not present

## 2019-06-22 MED ORDER — COLCHICINE 0.6 MG PO TABS: 0.6000 mg | ORAL_TABLET | Freq: Every day | ORAL | 1 refills | Status: DC

## 2019-06-22 MED ORDER — HYDROCODONE-ACETAMINOPHEN 7.5-325 MG PO TABS: 1.0000 | ORAL_TABLET | Freq: Four times a day (QID) | ORAL | 0 refills | Status: DC | PRN

## 2019-06-22 NOTE — Patient Instructions (Signed)
Thank you for coming in today. Lets try Colchicine: Take daily for 1 week.  Hydrocodone for more severe pain.  Labs and xray.  Let me know if you are not getting better.    Gout  Gout is painful swelling of your joints. Gout is a type of arthritis. It is caused by having too much uric acid in your body. Uric acid is a chemical that is made when your body breaks down substances called purines. If your body has too much uric acid, sharp crystals can form and build up in your joints. This causes pain and swelling. Gout attacks can happen quickly and be very painful (acute gout). Over time, the attacks can affect more joints and happen more often (chronic gout). What are the causes?  Too much uric acid in your blood. This can happen because: ? Your kidneys do not remove enough uric acid from your blood. ? Your body makes too much uric acid. ? You eat too many foods that are high in purines. These foods include organ meats, some seafood, and beer.  Trauma or stress. What increases the risk?  Having a family history of gout.  Being male and middle-aged.  Being male and having gone through menopause.  Being very overweight (obese).  Drinking alcohol, especially beer.  Not having enough water in the body (being dehydrated).  Losing weight too quickly.  Having an organ transplant.  Having lead poisoning.  Taking certain medicines.  Having kidney disease.  Having a skin condition called psoriasis. What are the signs or symptoms? An attack of acute gout usually happens in just one joint. The most common place is the big toe. Attacks often start at night. Other joints that may be affected include joints of the feet, ankle, knee, fingers, wrist, or elbow. Symptoms of an attack may include:  Very bad pain.  Warmth.  Swelling.  Stiffness.  Shiny, red, or purple skin.  Tenderness. The affected joint may be very painful to touch.  Chills and fever. Chronic gout may cause  symptoms more often. More joints may be involved. You may also have white or yellow lumps (tophi) on your hands or feet or in other areas near your joints. How is this treated?  Treatment for this condition has two phases: treating an acute attack and preventing future attacks.  Acute gout treatment may include: ? NSAIDs. ? Steroids. These are taken by mouth or injected into a joint. ? Colchicine. This medicine relieves pain and swelling. It can be given by mouth or through an IV tube.  Preventive treatment may include: ? Taking small doses of NSAIDs or colchicine daily. ? Using a medicine that reduces uric acid levels in your blood. ? Making changes to your diet. You may need to see a food expert (dietitian) about what to eat and drink to prevent gout. Follow these instructions at home: During a gout attack   If told, put ice on the painful area: ? Put ice in a plastic bag. ? Place a towel between your skin and the bag. ? Leave the ice on for 20 minutes, 2-3 times a day.  Raise (elevate) the painful joint above the level of your heart as often as you can.  Rest the joint as much as possible. If the joint is in your leg, you may be given crutches.  Follow instructions from your doctor about what you cannot eat or drink. Avoiding future gout attacks  Eat a low-purine diet. Avoid foods and drinks such  as: ? Liver. ? Kidney. ? Anchovies. ? Asparagus. ? Herring. ? Mushrooms. ? Mussels. ? Beer.  Stay at a healthy weight. If you want to lose weight, talk with your doctor. Do not lose weight too fast.  Start or continue an exercise plan as told by your doctor. Eating and drinking  Drink enough fluids to keep your pee (urine) pale yellow.  If you drink alcohol: ? Limit how much you use to:  0-1 drink a day for women.  0-2 drinks a day for men. ? Be aware of how much alcohol is in your drink. In the [place] one drink equals one 12 oz bottle of beer (355 mL), one 5 oz glass  of wine (148 mL), or one 1 oz glass of hard liquor (44 mL). General instructions  Take over-the-counter and prescription medicines only as told by your doctor.  Do not drive or use heavy machinery while taking prescription pain medicine.  Return to your normal activities as told by your doctor. Ask your doctor what activities are safe for you.  Keep all follow-up visits as told by your doctor. This is important. Contact a doctor if:  You have another gout attack.  You still have symptoms of a gout attack after 10 days of treatment.  You have problems (side effects) because of your medicines.  You have chills or a fever.  You have burning pain when you pee (urinate).  You have pain in your lower back or belly. Get help right away if:  You have very bad pain.  Your pain cannot be controlled.  You cannot pee. Summary  Gout is painful swelling of the joints.  The most common site of pain is the big toe, but it can affect other joints.  Medicines and avoiding some foods can help to prevent and treat gout attacks. This information is not intended to replace advice given to you by your health care provider. Make sure you discuss any questions you have with your health care provider. Document Released: 05/08/2008 Document Revised: 02/19/2018 Document Reviewed: 02/19/2018 Elsevier Patient Education  2020 [employer].

## 2019-06-22 NOTE — Progress Notes (Signed)
Erroneous encounter

## 2019-06-22 NOTE — Progress Notes (Signed)
Troy Palmer is a 73 y.o. male who presents to [place] today for left knee pain.  Patient was seen last week on November 2 for left knee pain.  At that time knee pain was thought to be possibly due to gout or DJD exacerbation.  He received a steroid injection which worked quite well.  However on Thursday, November 5 he was helping to pick his sister off the floor who had fallen and experienced anterior knee pain along with crepitations and popping.  Since then he has had knee swelling and pain that have not resolved.  He denies locking or catching but does note grinding and popping with knee motion.  He has tried leftover tramadol which has not helped much as well as over-the-counter medications and topical diclofenac gel.  These have not helped very much at all.  In the past he had been prescribed hydrocodone which has helped some.  Additionally he has a history of gout and does have an old prescription of colchicine but does not think that he is taking it.    ROS:  As above  Exam:  BP 120/66 (BP Location: Left Arm, Patient Position: Sitting, Cuff Size: Large)   Pulse 99   Ht 5' 9.5" (1.765 m)   Wt 292 lb 3.2 oz (132.5 kg)   SpO2 94%   BMI 42.53 kg/m   Wt Readings from Last 5 Encounters:  06/22/19 292 lb 3.2 oz (132.5 kg)  06/22/19 291 lb 9.6 oz (132.3 kg)  06/15/19 292 lb (132.5 kg)  06/04/19 299 lb (135.6 kg)  02/23/19 298 lb (135.2 kg)   General: Well Developed, well nourished, and in no acute distress.  Neuro/Psych: Alert and oriented x3, extra-ocular muscles intact, able to move all 4 extremities, sensation grossly intact. Skin: Warm and dry, no rashes noted.  Respiratory: Not using accessory muscles, speaking in full sentences, trachea midline.  Cardiovascular: Pulses palpable, no extremity edema. Abdomen: Does not appear distended. MSK: Left knee: Mild effusion otherwise normal-appearing with mature scar. Range of motion 5-100 degrees with  crepitation. Tender palpation anterior medial and lateral knee. Stable ligamentous exam. Mild antalgic gait.    Lab and Radiology Results Xr Knee 1-2 Views Right (for Use With Left Knee Complete)  Result Date: 06/22/2019 CLINICAL DATA:  Left knee pain, comparison view EXAM: RIGHT KNEE - 2 VIEW COMPARISON:  None. FINDINGS: Right knee prosthesis is noted in satisfactory position. No acute fracture or dislocation is noted. IMPRESSION: Status post right knee replacement Electronically Signed   By: Inez Catalina M.D.   On: 06/22/2019 23:19   Xr Knee Complete 4 Views Left  Result Date: 06/22/2019 CLINICAL DATA:  Left knee pain EXAM: LEFT KNEE - COMPLETE 4+ VIEW COMPARISON:  06/04/2019 FINDINGS: Right knee prosthesis is noted. No acute fracture or dislocation is seen. Mild patellofemoral degenerative change is noted. Small joint effusion is again seen. No other focal abnormality is noted. IMPRESSION: Mild patellofemoral degenerative change. Small joint effusion is again noted. Electronically Signed   By: Inez Catalina M.D.   On: 06/22/2019 23:18   I, Lynne Leader, personally (independently) visualized and performed the interpretation of the images attached in this note.     Assessment and Plan: 73 y.o. male with left knee pain.  Patient had great response initially to steroid injection but had return of pain with a deep bend and lifting while helping his sister get off the floor.  It is possible he suffered a meniscus injury or  some other injury in his knee during this or if possible that it is only partially related.  I think it still possible that he is having a gout flare.  Plan to check labs including CBC uric acid and metabolic panel and represcribed colchicine.  We will also prescribe hydrocodone for severe fear pain control.  Recheck back if not improving.   PDMP reviewed during this encounter. Orders Placed This Encounter  Procedures  . XR Knee Complete 4 Views Left    Please include  patellar sunrise, lateral, and weightbearing bilateral AP and bilateral rosenberg views    Standing Status:   Future    Number of Occurrences:   1    Standing Expiration Date:   08/21/2020    Order Specific Question:   Reason for exam:    Answer:   Please include patellar sunrise, lateral, and weightbearing bilateral AP and bilateral rosenberg views    Comments:   Please include patellar sunrise, lateral, and weightbearing bilateral AP and bilateral rosenberg views    Order Specific Question:   Preferred imaging location?    Answer:   [facility]  . XR Knee 1-2 Views Right (for use with LEFT Knee Complete)    Standing Status:   Future    Number of Occurrences:   1    Standing Expiration Date:   08/22/2020    Order Specific Question:   Reason for Exam (SYMPTOM  OR DIAGNOSIS REQUIRED)    Answer:   For use with the left knee x-ray bilateral AP and Rosenberg standing.    Order Specific Question:   Preferred imaging location?    Answer:   [facility]  . CBC  . Uric acid  . Comprehensive Metabolic Panel (CMET)   Meds ordered this encounter  Medications  . colchicine 0.6 MG tablet    Sig: Take 1 tablet (0.6 mg total) by mouth daily. For gout pain    Dispense:  30 tablet    Refill:  1  . HYDROcodone-acetaminophen (NORCO) 7.5-325 MG tablet    Sig: Take 1-2 tablets by mouth every 6 (six) hours as needed for moderate pain.    Dispense:  20 tablet    Refill:  0    Historical information moved to improve visibility of documentation.  Past Medical History:  Diagnosis Date  . ALLERGIC RHINITIS 04/09/2007  . ASTHMA 12/04/2007  . BACK PAIN 09/16/2009  . BENIGN PROSTATIC HYPERTROPHY 04/09/2007  . CEREBROVASCULAR ACCIDENT, HX OF 07/17/2010  . COLONIC POLYPS, HX OF 12/04/2007  . CONSTIPATION 09/25/2010  . Degeneration of cervical intervertebral disc 03/28/2007  . DEPRESSION 12/04/2007  . DIVERTICULOSIS, COLON 12/04/2007  . DYSPHAGIA UNSPECIFIED 03/16/2008  . ERECTILE DYSFUNCTION  04/09/2007  . ESOPHAGEAL STRICTURE 05/26/2008  . GERD 12/04/2007  . HEMORRHOIDS, RECURRENT 02/11/2008  . HYPERLIPIDEMIA 12/04/2007  . HYPERTENSION 03/28/2007  . LEG PAIN, BILATERAL 01/25/2010  . LOW BACK PAIN 04/09/2007  . LUNG NODULE 12/04/2007  . Osteoarth NOS-Unspec 03/28/2007  . Other dysphagia 11/21/2009  . Peripheral neuropathy 05/29/2018  . POLYARTHRALGIA 07/13/2008  . Polymyalgia rheumatica ([place]) 07/26/2008  . PVD WITH CLAUDICATION 02/10/2010  . RENAL INSUFFICIENCY 03/15/2010  . SLEEP APNEA, OBSTRUCTIVE, MODERATE 04/09/2007  . TB SKIN TEST, POSITIVE 03/28/2007   Past Surgical History:  Procedure Laterality Date  . ROTATOR CUFF REPAIR     Left  . TOTAL KNEE ARTHROPLASTY     x 2   Social History   Tobacco Use  . Smoking status: Never Smoker  . Smokeless  tobacco: Never Used  Substance Use Topics  . Alcohol use: No   family history includes Asthma in an other family member; Heart disease in his father; Hypertension in his brother; Lupus in his brother.  Medications: Current Outpatient Medications  Medication Sig Dispense Refill  . acetaminophen (TYLENOL 8 HOUR) 650 MG CR tablet Take 650 mg by mouth every 6 (six) hours as needed (pain/headache).    Marland Kitchen allopurinol (ZYLOPRIM) 100 MG tablet TAKE 1 TABLET(100 MG) BY MOUTH DAILY 90 tablet 1  . amLODipine (NORVASC) 5 MG tablet TAKE 1 TABLET(5 MG) BY MOUTH DAILY 30 tablet 5  . atorvastatin (LIPITOR) 40 MG tablet TAKE 1 TABLET(40 MG) BY MOUTH DAILY 90 tablet 1  . colchicine 0.6 MG tablet Take 1 tablet (0.6 mg total) by mouth daily. For gout pain 30 tablet 1  . diclofenac sodium (VOLTAREN) 1 % GEL Apply 4 g topically 4 (four) times daily. 100 g 12  . furosemide (LASIX) 40 MG tablet TAKE 1 TABLET BY MOUTH EVERY MORNING AND 1 TABLET IN THE EVENING AS NEEDED FOR SWELLING ONLY 60 tablet 5  . HYDROcodone-acetaminophen (NORCO) 7.5-325 MG tablet Take 1-2 tablets by mouth every 6 (six) hours as needed for moderate pain. 20 tablet 0  . lidocaine  (LIDODERM) 5 % Place 1 patch onto the skin daily. Apply patch to the left shoulder area. Remove & Discard patch within 12 hours or as directed by MD 14 patch 0  . Nebivolol HCl (BYSTOLIC) 20 MG TABS 1 tab by mouth daily 90 tablet 3  . pantoprazole (PROTONIX) 40 MG tablet Take 1 tablet (40 mg total) by mouth daily. 90 tablet 3  . tamsulosin (FLOMAX) 0.4 MG CAPS capsule Take 1 capsule (0.4 mg total) by mouth 2 (two) times daily. 180 capsule 1  . tiZANidine (ZANAFLEX) 2 MG tablet Take 1 tablet (2 mg total) by mouth every 6 (six) hours as needed for muscle spasms. 40 tablet 1   No current facility-administered medications for this visit.    Allergies  Allergen Reactions  . Ace Inhibitors Swelling    Angioedema throat  . Augmentin [Amoxicillin-Pot Clavulanate] Other (See Comments)    Marked weakness with po med x 3 doses  . Doxycycline     Some GI upset  . Sulfa Antibiotics Hives    And facial angioedema      Discussed warning signs or symptoms. Please see discharge instructions. Patient expresses understanding.  The above documentation has been reviewed and is accurate and complete Lynne Leader

## 2019-06-23 NOTE — Progress Notes (Signed)
Mild arthritis present not enough to explain the degree of pain that you have.

## 2019-06-24 NOTE — Addendum Note (Signed)
Addended by: Francis Dowse T on: 06/24/2019 10:55 AM   Modules accepted: Orders

## 2019-06-25 ENCOUNTER — Other Ambulatory Visit: Payer: Medicare Other

## 2019-07-08 ENCOUNTER — Encounter (HOSPITAL_COMMUNITY): Payer: Self-pay

## 2019-07-08 ENCOUNTER — Ambulatory Visit (HOSPITAL_COMMUNITY)
Admission: EM | Admit: 2019-07-08 | Discharge: 2019-07-08 | Disposition: A | Discharge status: Home / Self Care | Payer: Medicare Other | Attending: Emergency Medicine | Admitting: Emergency Medicine

## 2019-07-08 ENCOUNTER — Other Ambulatory Visit: Payer: Self-pay

## 2019-07-08 DIAGNOSIS — Z882 Allergy status to sulfonamides status: Secondary | ICD-10-CM | POA: Insufficient documentation

## 2019-07-08 DIAGNOSIS — R6883 Chills (without fever): Secondary | ICD-10-CM | POA: Insufficient documentation

## 2019-07-08 DIAGNOSIS — N189 Chronic kidney disease, unspecified: Secondary | ICD-10-CM | POA: Diagnosis not present

## 2019-07-08 DIAGNOSIS — Z8673 Personal history of transient ischemic attack (TIA), and cerebral infarction without residual deficits: Secondary | ICD-10-CM | POA: Insufficient documentation

## 2019-07-08 DIAGNOSIS — I129 Hypertensive chronic kidney disease with stage 1 through stage 4 chronic kidney disease, or unspecified chronic kidney disease: Secondary | ICD-10-CM | POA: Diagnosis not present

## 2019-07-08 DIAGNOSIS — Z791 Long term (current) use of non-steroidal anti-inflammatories (NSAID): Secondary | ICD-10-CM | POA: Insufficient documentation

## 2019-07-08 DIAGNOSIS — J029 Acute pharyngitis, unspecified: Secondary | ICD-10-CM | POA: Diagnosis not present

## 2019-07-08 DIAGNOSIS — Z79899 Other long term (current) drug therapy: Secondary | ICD-10-CM | POA: Diagnosis not present

## 2019-07-08 DIAGNOSIS — K219 Gastro-esophageal reflux disease without esophagitis: Secondary | ICD-10-CM | POA: Insufficient documentation

## 2019-07-08 DIAGNOSIS — Z881 Allergy status to other antibiotic agents status: Secondary | ICD-10-CM | POA: Insufficient documentation

## 2019-07-08 DIAGNOSIS — Z20828 Contact with and (suspected) exposure to other viral communicable diseases: Secondary | ICD-10-CM | POA: Insufficient documentation

## 2019-07-08 DIAGNOSIS — Z880 Allergy status to penicillin: Secondary | ICD-10-CM | POA: Diagnosis not present

## 2019-07-08 LAB — POCT RAPID STREP A: Streptococcus, Group A Screen (Direct): NEGATIVE

## 2019-07-08 NOTE — Discharge Instructions (Signed)
You are negative today for strep throat.  We have also tested you for covid 19  and will call you in the next 2-3 days if this returns positive. Your negative will send through on your mychart.  Still likely viral sore throat.  Tylenol as needed for pain.  Throat lozenges, gargles, chloraseptic spray, warm teas, popsicles etc to help with throat pain.   If symptoms worsen or do not improve in the next week to return to be seen or to follow up with your PCP.

## 2019-07-08 NOTE — ED Provider Notes (Signed)
[facility]    CSN: [id number] Arrival date & time: 07/08/19  [id number]      History   Chief Complaint Chief Complaint  Patient presents with  . Sore Throat    HPI Troy Palmer is a 73 y.o. male.   Vonzella Nipple presents with complaints of sore throat which is scratchy. His granddaughter had strep throat a couple of weeks ago. Last night was spitting up mucus. Sore throat started last night. History of sinus issues so has regular nasal drainage. No fever. No cough. Chills. Vomited after he was swabbed for strep throat but otherwise no nausea. Has had diarrhea since taking a medication for his knee, per chart review this was 11/9. No new diarrhea. Has had 2-3 episodes a day. No blood or black. Hasn't taken any medications for his symptoms. Last night his throat felt similar to strep throat in the past. Doesn't work outside of the home. His granddaughter does live with him and she does work outside of the home. History  Of asthma, allergic rhinitis, BPH, CVA gerd, htn, gout, ckd.     ROS per HPI, negative if not otherwise mentioned.      Past Medical History:  Diagnosis Date  . ALLERGIC RHINITIS 04/09/2007  . ASTHMA 12/04/2007  . BACK PAIN 09/16/2009  . BENIGN PROSTATIC HYPERTROPHY 04/09/2007  . CEREBROVASCULAR ACCIDENT, HX OF 07/17/2010  . COLONIC POLYPS, HX OF 12/04/2007  . CONSTIPATION 09/25/2010  . Degeneration of cervical intervertebral disc 03/28/2007  . DEPRESSION 12/04/2007  . DIVERTICULOSIS, COLON 12/04/2007  . DYSPHAGIA UNSPECIFIED 03/16/2008  . ERECTILE DYSFUNCTION 04/09/2007  . ESOPHAGEAL STRICTURE 05/26/2008  . GERD 12/04/2007  . HEMORRHOIDS, RECURRENT 02/11/2008  . HYPERLIPIDEMIA 12/04/2007  . HYPERTENSION 03/28/2007  . LEG PAIN, BILATERAL 01/25/2010  . LOW BACK PAIN 04/09/2007  . LUNG NODULE 12/04/2007  . Osteoarth NOS-Unspec 03/28/2007  . Other dysphagia 11/21/2009  . Peripheral neuropathy 05/29/2018  . POLYARTHRALGIA 07/13/2008  . Polymyalgia rheumatica  ([place]) 07/26/2008  . PVD WITH CLAUDICATION 02/10/2010  . RENAL INSUFFICIENCY 03/15/2010  . SLEEP APNEA, OBSTRUCTIVE, MODERATE 04/09/2007  . TB SKIN TEST, POSITIVE 03/28/2007    Patient Active Problem List   Diagnosis Date Noted  . Left knee pain 06/04/2019  . Neck pain 02/23/2019  . Recurrent falls 08/19/2018  . Acute upper respiratory infection 08/19/2018  . Abdominal pain 07/25/2018  . Peripheral neuropathy 05/29/2018  . Paresthesia of bilateral legs 05/05/2018  . Cough variant asthma  vs UACS assoc with pnds 02/27/2018  . Asthmatic bronchitis 02/10/2018  . Acute respiratory failure with hypoxia ([place]) 02/09/2018  . Pain, dental 02/03/2018  . Cough 02/03/2018  . Wheezing 02/03/2018  . Pain and swelling of lower leg, right 12/31/2017  . BPH with obstruction/lower urinary tract symptoms 05/16/2017  . Bilateral ankle pain 05/16/2017  . Acute gout 02/26/2017  . DOE (dyspnea on exertion) 11/16/2016  . Complete tear of right rotator cuff 06/26/2016  . Cellulitis of deltoid region 06/26/2016  . Greater trochanteric bursitis of right hip 05/05/2014  . Right hip pain 05/04/2014  . Posterior neck pain 12/22/2013  . Orchitis, right 12/22/2013  . Paresthesia of both feet 12/22/2013  . Bilateral shoulder pain 09/16/2013  . Allergic angioedema 07/31/2012  . Impaired glucose tolerance 04/15/2012  . Chest pain 02/24/2012  . Foot pain, bilateral 02/20/2012  . Leg pain, bilateral 01/19/2012  . Edema 01/16/2012  . Dysphagia 11/21/2011  . Gout 06/07/2011  . CKD (chronic kidney disease) 06/07/2011  . Encounter for  well adult exam with abnormal findings 02/14/2011  . Encounter for long-term (current) use of other medications 11/08/2010  . CONSTIPATION 09/25/2010  . Memory loss 06/28/2010  . PVD (peripheral vascular disease) ([place]) 02/10/2010  . LEG PAIN, BILATERAL 01/25/2010  . Backache 09/16/2009  . Polymyalgia rheumatica ([place]) 07/26/2008  . POLYARTHRALGIA 07/13/2008  . ESOPHAGEAL STRICTURE  05/26/2008  . HEMORRHOIDS, RECURRENT 02/11/2008  . Hyperlipidemia 12/04/2007  . DEPRESSION 12/04/2007  . Asthma 12/04/2007  . LUNG NODULE 12/04/2007  . GERD 12/04/2007  . DIVERTICULOSIS, COLON 12/04/2007  . COLONIC POLYPS, HX OF 12/04/2007  . ERECTILE DYSFUNCTION 04/09/2007  . SLEEP APNEA, OBSTRUCTIVE, MODERATE 04/09/2007  . Allergic rhinitis 04/09/2007  . BENIGN PROSTATIC HYPERTROPHY 04/09/2007  . LOW BACK PAIN 04/09/2007  . Morbid obesity due to excess calories ([place]) 03/28/2007  . Hypertension 03/28/2007  . Osteoarth NOS-Unspec 03/28/2007  . Degeneration of cervical intervertebral disc 03/28/2007  . TB SKIN TEST, POSITIVE 03/28/2007    Past Surgical History:  Procedure Laterality Date  . ROTATOR CUFF REPAIR     Left  . TOTAL KNEE ARTHROPLASTY     x 2       Home Medications    Prior to Admission medications   Medication Sig Start Date End Date Taking? Authorizing Provider  acetaminophen (TYLENOL 8 HOUR) 650 MG CR tablet Take 650 mg by mouth every 6 (six) hours as needed (pain/headache).    [provider]  allopurinol (ZYLOPRIM) 100 MG tablet TAKE 1 TABLET(100 MG) BY MOUTH DAILY 06/18/19   Biagio Borg, MD  amLODipine (NORVASC) 5 MG tablet TAKE 1 TABLET(5 MG) BY MOUTH DAILY 06/18/19   Biagio Borg, MD  atorvastatin (LIPITOR) 40 MG tablet TAKE 1 TABLET(40 MG) BY MOUTH DAILY 06/15/19   Biagio Borg, MD  colchicine 0.6 MG tablet Take 1 tablet (0.6 mg total) by mouth daily. For gout pain 06/22/19   Gregor Hams, MD  diclofenac sodium (VOLTAREN) 1 % GEL Apply 4 g topically 4 (four) times daily. 06/15/19   Gregor Hams, MD  furosemide (LASIX) 40 MG tablet TAKE 1 TABLET BY MOUTH EVERY MORNING AND 1 TABLET IN THE EVENING AS NEEDED FOR SWELLING ONLY 02/23/19   Biagio Borg, MD  HYDROcodone-acetaminophen (NORCO) 7.5-325 MG tablet Take 1-2 tablets by mouth every 6 (six) hours as needed for moderate pain. 06/22/19   Gregor Hams, MD  lidocaine (LIDODERM) 5 % Place 1 patch  onto the skin daily. Apply patch to the left shoulder area. Remove & Discard patch within 12 hours or as directed by MD 02/17/19   Petrucelli, Glynda Jaeger, PA-C  Nebivolol HCl (BYSTOLIC) 20 MG TABS 1 tab by mouth daily 05/27/19   Biagio Borg, MD  pantoprazole (PROTONIX) 40 MG tablet Take 1 tablet (40 mg total) by mouth daily. 06/16/19   Biagio Borg, MD  tamsulosin (FLOMAX) 0.4 MG CAPS capsule Take 1 capsule (0.4 mg total) by mouth 2 (two) times daily. 06/16/19   Biagio Borg, MD  tiZANidine (ZANAFLEX) 2 MG tablet Take 1 tablet (2 mg total) by mouth every 6 (six) hours as needed for muscle spasms. 02/23/19   Biagio Borg, MD    Family History Family History  Problem Relation Age of Onset  . Healthy Mother   . Heart disease Father   . Lupus Brother   . Hypertension Brother   . Asthma Other     Social History Social History   Tobacco Use  . Smoking status:  Never Smoker  . Smokeless tobacco: Never Used  Substance Use Topics  . Alcohol use: No  . Drug use: No     Allergies   Ace inhibitors, Augmentin [amoxicillin-pot clavulanate], Doxycycline, and Sulfa antibiotics   Review of Systems Review of Systems   Physical Exam Triage Vital Signs ED Triage Vitals  Enc Vitals Group     BP 07/08/19 0921 (!) 148/66     Pulse Rate 07/08/19 0921 76     Resp 07/08/19 0921 16     Temp 07/08/19 0921 98.5 F (36.9 C)     Temp Source 07/08/19 0921 Oral     SpO2 07/08/19 0921 100 %     Weight --      Height --      Head Circumference --      Peak Flow --      Pain Score 07/08/19 0919 8     Pain Loc --      Pain Edu? --      Excl. in [place]? --    No data found.  Updated Vital Signs BP (!) 148/66 (BP Location: Left Arm)   Pulse 76   Temp 98.5 F (36.9 C) (Oral)   Resp 16   SpO2 100%    Physical Exam Constitutional:      Appearance: He is well-developed.  HENT:     Mouth/Throat:     Pharynx: Oropharynx is clear. No pharyngeal swelling, oropharyngeal exudate, posterior  oropharyngeal erythema or uvula swelling.     Tonsils: No tonsillar exudate or tonsillar abscesses. 1+ on the right. 1+ on the left.  Cardiovascular:     Rate and Rhythm: Normal rate.  Pulmonary:     Effort: Pulmonary effort is normal.  Skin:    General: Skin is warm and dry.  Neurological:     Mental Status: He is alert and oriented to person, place, and time.      UC Treatments / Results  Labs (all labs ordered are listed, but only abnormal results are displayed) Labs Reviewed  NOVEL CORONAVIRUS, NAA (HOSP ORDER, SEND-OUT TO REF LAB; TAT 18-24 HRS)  CULTURE, GROUP A STREP [facility])  POCT RAPID STREP A    EKG   Radiology No results found.  Procedures Procedures (including critical care time)  Medications Ordered in UC Medications - No data to display  Initial Impression / Assessment and Plan / UC Course  I have reviewed the triage vital signs and the nursing notes.  Pertinent labs & imaging results that were available during my care of the patient were reviewed by me and considered in my medical decision making (see chart for details).     Non toxic. Benign physical exam.  Afebrile. Negative rapid strep. covid testing also collected and pending. Supportive cares recommended for likely viral pharyngitis. Return precautions provided. Patient verbalized understanding and agreeable to plan.   Final Clinical Impressions(s) / UC Diagnoses   Final diagnoses:  Pharyngitis, unspecified etiology     Discharge Instructions     You are negative today for strep throat.  We have also tested you for covid 19  and will call you in the next 2-3 days if this returns positive. Your negative will send through on your mychart.  Still likely viral sore throat.  Tylenol as needed for pain.  Throat lozenges, gargles, chloraseptic spray, warm teas, popsicles etc to help with throat pain.   If symptoms worsen or do not improve in the next week to return to be  seen or to follow up with  your PCP.      ED Prescriptions    None     PDMP not reviewed this encounter.   Zigmund Gottron, NP 07/08/19 [phone]

## 2019-07-08 NOTE — ED Triage Notes (Signed)
Patient presents to Urgent Care with complaints of sore throat since 2 days ago. Patient reports he has tried gargling with warm salt water.

## 2019-07-10 LAB — NOVEL CORONAVIRUS, NAA (HOSP ORDER, SEND-OUT TO REF LAB; TAT 18-24 HRS): SARS-CoV-2, NAA: NOT DETECTED

## 2019-07-10 LAB — CULTURE, GROUP A STREP (THRC)

## 2019-07-14 ENCOUNTER — Ambulatory Visit (INDEPENDENT_AMBULATORY_CARE_PROVIDER_SITE_OTHER): Payer: Medicare Other | Admitting: Internal Medicine

## 2019-07-14 DIAGNOSIS — R7302 Impaired glucose tolerance (oral): Secondary | ICD-10-CM | POA: Diagnosis not present

## 2019-07-14 DIAGNOSIS — E782 Mixed hyperlipidemia: Secondary | ICD-10-CM | POA: Diagnosis not present

## 2019-07-14 DIAGNOSIS — I10 Essential (primary) hypertension: Secondary | ICD-10-CM

## 2019-07-14 MED ORDER — PREDNISONE 10 MG PO TABS: ORAL_TABLET | ORAL | 0 refills | Status: DC

## 2019-07-14 MED ORDER — LEVOFLOXACIN 500 MG PO TABS: 500.0000 mg | ORAL_TABLET | Freq: Every day | ORAL | 0 refills | Status: AC

## 2019-07-14 NOTE — Progress Notes (Signed)
Patient ID: Troy Palmer, male   DOB: [birth year], 73 y.o.   MRN: [id number]  Virtual Visit via Video Note  I connected with Troy Palmer on 07/14/19 at  1:40 PM EST by a video enabled telemedicine application and verified that I am speaking with the correct person using two identifiers.  Location: Patient: at home Provider: at office   I discussed the limitations of evaluation and management by telemedicine and the availability of in person appointments. The patient expressed understanding and agreed to proceed.  History of Present Illness: Here to f/u; overall doing ok,  Pt denies chest pain, increasing sob or doe, wheezing, orthopnea, PND, increased LE swelling, palpitations, dizziness or syncope.  Pt denies new neurological symptoms such as new headache, or facial or extremity weakness or numbness.  Pt denies polydipsia, polyuria, or low sugar episode.  Pt states overall good compliance with meds, mostly trying to follow appropriate diet, with wt overall stable,  but little exercise however. No new complaints Past Medical History:  Diagnosis Date  . ALLERGIC RHINITIS 04/09/2007  . ASTHMA 12/04/2007  . BACK PAIN 09/16/2009  . BENIGN PROSTATIC HYPERTROPHY 04/09/2007  . CEREBROVASCULAR ACCIDENT, HX OF 07/17/2010  . COLONIC POLYPS, HX OF 12/04/2007  . CONSTIPATION 09/25/2010  . Degeneration of cervical intervertebral disc 03/28/2007  . DEPRESSION 12/04/2007  . DIVERTICULOSIS, COLON 12/04/2007  . DYSPHAGIA UNSPECIFIED 03/16/2008  . ERECTILE DYSFUNCTION 04/09/2007  . ESOPHAGEAL STRICTURE 05/26/2008  . GERD 12/04/2007  . HEMORRHOIDS, RECURRENT 02/11/2008  . HYPERLIPIDEMIA 12/04/2007  . HYPERTENSION 03/28/2007  . LEG PAIN, BILATERAL 01/25/2010  . LOW BACK PAIN 04/09/2007  . LUNG NODULE 12/04/2007  . Osteoarth NOS-Unspec 03/28/2007  . Other dysphagia 11/21/2009  . Peripheral neuropathy 05/29/2018  . POLYARTHRALGIA 07/13/2008  . Polymyalgia rheumatica ([place]) 07/26/2008  . PVD WITH CLAUDICATION  02/10/2010  . RENAL INSUFFICIENCY 03/15/2010  . SLEEP APNEA, OBSTRUCTIVE, MODERATE 04/09/2007  . TB SKIN TEST, POSITIVE 03/28/2007   Past Surgical History:  Procedure Laterality Date  . ROTATOR CUFF REPAIR     Left  . TOTAL KNEE ARTHROPLASTY     x 2    reports that he has never smoked. He has never used smokeless tobacco. He reports that he does not drink alcohol or use drugs. family history includes Asthma in an other family member; Healthy in his mother; Heart disease in his father; Hypertension in his brother; Lupus in his brother. Allergies  Allergen Reactions  . Ace Inhibitors Swelling    Angioedema throat  . Augmentin [Amoxicillin-Pot Clavulanate] Other (See Comments)    Marked weakness with po med x 3 doses  . Doxycycline     Some GI upset  . Sulfa Antibiotics Hives    And facial angioedema   Current Outpatient Medications on File Prior to Visit  Medication Sig Dispense Refill  . acetaminophen (TYLENOL 8 HOUR) 650 MG CR tablet Take 650 mg by mouth every 6 (six) hours as needed (pain/headache).    Marland Kitchen allopurinol (ZYLOPRIM) 100 MG tablet TAKE 1 TABLET(100 MG) BY MOUTH DAILY 90 tablet 1  . amLODipine (NORVASC) 5 MG tablet TAKE 1 TABLET(5 MG) BY MOUTH DAILY 30 tablet 5  . atorvastatin (LIPITOR) 40 MG tablet TAKE 1 TABLET(40 MG) BY MOUTH DAILY 90 tablet 1  . colchicine 0.6 MG tablet Take 1 tablet (0.6 mg total) by mouth daily. For gout pain 30 tablet 1  . diclofenac sodium (VOLTAREN) 1 % GEL Apply 4 g topically 4 (four) times daily. 100 g 12  .  furosemide (LASIX) 40 MG tablet TAKE 1 TABLET BY MOUTH EVERY MORNING AND 1 TABLET IN THE EVENING AS NEEDED FOR SWELLING ONLY 60 tablet 5  . HYDROcodone-acetaminophen (NORCO) 7.5-325 MG tablet Take 1-2 tablets by mouth every 6 (six) hours as needed for moderate pain. 20 tablet 0  . lidocaine (LIDODERM) 5 % Place 1 patch onto the skin daily. Apply patch to the left shoulder area. Remove & Discard patch within 12 hours or as directed by MD 14 patch  0  . Nebivolol HCl (BYSTOLIC) 20 MG TABS 1 tab by mouth daily 90 tablet 3  . pantoprazole (PROTONIX) 40 MG tablet Take 1 tablet (40 mg total) by mouth daily. 90 tablet 3  . tamsulosin (FLOMAX) 0.4 MG CAPS capsule Take 1 capsule (0.4 mg total) by mouth 2 (two) times daily. 180 capsule 1  . tiZANidine (ZANAFLEX) 2 MG tablet Take 1 tablet (2 mg total) by mouth every 6 (six) hours as needed for muscle spasms. 40 tablet 1   No current facility-administered medications on file prior to visit.     Observations/Objective: Alert, NAD, appropriate mood and affect, resps normal, cn 2-12 intact, moves all 4s, no visible rash or swelling Lab Results  Component Value Date   WBC 5.1 08/19/2018   HGB 13.0 08/19/2018   HCT 40.6 08/19/2018   PLT 208.0 08/19/2018   GLUCOSE 75 08/19/2018   CHOL 175 08/19/2018   TRIG 65.0 08/19/2018   HDL 52.60 08/19/2018   LDLDIRECT 156.9 10/10/2010   LDLCALC 110 (H) 08/19/2018   ALT 14 08/19/2018   AST 15 08/19/2018   NA 142 08/19/2018   K 5.1 08/19/2018   CL 106 08/19/2018   CREATININE 1.97 (H) 08/19/2018   BUN 29 (H) 08/19/2018   CO2 32 08/19/2018   TSH 1.97 08/19/2018   PSA 1.11 08/19/2018   HGBA1C 5.8 08/19/2018   Assessment and Plan: See notes  Follow Up Instructions: See notes   I discussed the assessment and treatment plan with the patient. The patient was provided an opportunity to ask questions and all were answered. The patient agreed with the plan and demonstrated an understanding of the instructions.   The patient was advised to call back or seek an in-person evaluation if the symptoms worsen or if the condition fails to improve as anticipated.  Cathlean Cower, MD

## 2019-07-19 ENCOUNTER — Encounter: Payer: Self-pay | Admitting: Internal Medicine

## 2019-07-19 NOTE — Assessment & Plan Note (Signed)
stable overall by history and exam, recent data reviewed with pt, and pt to continue medical treatment as before,  to f/u any worsening symptoms or concerns  

## 2019-07-19 NOTE — Patient Instructions (Signed)
Please continue all other medications as before, and refills have been done if requested.  Please have the pharmacy call with any other refills you may need.  Please continue your efforts at being more active, low cholesterol diet, and weight control.  Please keep your appointments with your specialists as you may have planned     

## 2019-09-03 ENCOUNTER — Telehealth: Payer: Self-pay

## 2019-09-03 NOTE — Telephone Encounter (Signed)
USES [facility] [address]. REQUEST:  Triamcinolone 0.25 cream for rash on both legs. Itches. Used in the past and it works and dries it up. Pt states had the same tube for eight years but it is all gone.

## 2019-09-04 ENCOUNTER — Telehealth: Payer: Self-pay | Admitting: Internal Medicine

## 2019-09-04 MED ORDER — TRIAMCINOLONE ACETONIDE 0.1 % EX CREA: 1.0000 "application " | TOPICAL_CREAM | Freq: Two times a day (BID) | CUTANEOUS | 1 refills | Status: DC

## 2019-09-04 NOTE — Telephone Encounter (Signed)
Ok this is done 

## 2019-09-08 ENCOUNTER — Other Ambulatory Visit: Payer: Self-pay

## 2019-09-08 ENCOUNTER — Encounter: Payer: Self-pay | Admitting: Internal Medicine

## 2019-09-08 ENCOUNTER — Ambulatory Visit (INDEPENDENT_AMBULATORY_CARE_PROVIDER_SITE_OTHER): Payer: Medicare Other | Admitting: Internal Medicine

## 2019-09-08 DIAGNOSIS — L259 Unspecified contact dermatitis, unspecified cause: Secondary | ICD-10-CM | POA: Diagnosis not present

## 2019-09-08 DIAGNOSIS — R7302 Impaired glucose tolerance (oral): Secondary | ICD-10-CM | POA: Diagnosis not present

## 2019-09-08 DIAGNOSIS — J309 Allergic rhinitis, unspecified: Secondary | ICD-10-CM | POA: Diagnosis not present

## 2019-09-08 DIAGNOSIS — I10 Essential (primary) hypertension: Secondary | ICD-10-CM

## 2019-09-08 MED ORDER — TRIAMCINOLONE ACETONIDE 0.1 % EX CREA: 1.0000 "application " | TOPICAL_CREAM | Freq: Two times a day (BID) | CUTANEOUS | 1 refills | Status: AC

## 2019-09-08 MED ORDER — TRIAMCINOLONE ACETONIDE 55 MCG/ACT NA AERO: 2.0000 | INHALATION_SPRAY | Freq: Every day | NASAL | 12 refills | Status: DC

## 2019-09-08 NOTE — Patient Instructions (Signed)
Please take all new medication as prescribed  Please continue all other medications as before, and refills have been done if requested.  Please have the pharmacy call with any other refills you may need.  Please continue your efforts at being more active, low cholesterol diet, and weight control.  Please keep your appointments with your specialists as you may have planned    

## 2019-09-08 NOTE — Assessment & Plan Note (Signed)
Encouraged to check BP at home for goal < 140/90

## 2019-09-08 NOTE — Assessment & Plan Note (Signed)
stable overall by history and exam, recent data reviewed with pt, and pt to continue medical treatment as before,  to f/u any worsening symptoms or concerns  

## 2019-09-08 NOTE — Assessment & Plan Note (Signed)
Mild to mod, for nasacort asd,  to f/u any worsening symptoms or concerns 

## 2019-09-08 NOTE — Assessment & Plan Note (Addendum)
Mild to mod, for triam cr prn,,  to f/u any worsening symptoms or concerns  I spent 32 minutes preparing to see the patient by review of recent labs, imaging and procedures, obtaining and reviewing separately obtained history, communicating with the patient and family or caregiver, ordering medications, tests or procedures, and documenting clinical information in the EHR including the differential Dx, treatment, and any further evaluation and other management of contact dermatitis, hyperglycemia, HTN, allergic rhinits

## 2019-09-08 NOTE — Progress Notes (Signed)
Patient ID: Troy Palmer, male   DOB: Apr 20, 1946, 74 y.o.   MRN: [id number]  Virtual Visit via Video Note  I connected with Troy Palmer on 09/08/19 at  1:00 PM EST by a video enabled telemedicine application and verified that I am speaking with the correct person using two identifiers.  Location: Patient: at home Provider: at office   I discussed the limitations of evaluation and management by telemedicine and the availability of in person appointments. The patient expressed understanding and agreed to proceed.  History of Present Illness: Here with itchy rash to back of calves after walking in the woods, not sure what he got into, no fever/red/pain or swelling, and helped a little by old steroid cream but now out. Also, Does have several wks ongoing nasal allergy symptoms with clearish congestion, itch and sneezing, without fever, pain, ST, cough, swelling or wheezing.   Pt denies polydipsia, polyuria. Not check BP too often at home Past Medical History:  Diagnosis Date  . ALLERGIC RHINITIS 04/09/2007  . ASTHMA 12/04/2007  . BACK PAIN 09/16/2009  . BENIGN PROSTATIC HYPERTROPHY 04/09/2007  . CEREBROVASCULAR ACCIDENT, HX OF 07/17/2010  . COLONIC POLYPS, HX OF 12/04/2007  . CONSTIPATION 09/25/2010  . Degeneration of cervical intervertebral disc 03/28/2007  . DEPRESSION 12/04/2007  . DIVERTICULOSIS, COLON 12/04/2007  . DYSPHAGIA UNSPECIFIED 03/16/2008  . ERECTILE DYSFUNCTION 04/09/2007  . ESOPHAGEAL STRICTURE 05/26/2008  . GERD 12/04/2007  . HEMORRHOIDS, RECURRENT 02/11/2008  . HYPERLIPIDEMIA 12/04/2007  . HYPERTENSION 03/28/2007  . LEG PAIN, BILATERAL 01/25/2010  . LOW BACK PAIN 04/09/2007  . LUNG NODULE 12/04/2007  . Osteoarth NOS-Unspec 03/28/2007  . Other dysphagia 11/21/2009  . Peripheral neuropathy 05/29/2018  . POLYARTHRALGIA 07/13/2008  . Polymyalgia rheumatica ([place]) 07/26/2008  . PVD WITH CLAUDICATION 02/10/2010  . RENAL INSUFFICIENCY 03/15/2010  . SLEEP APNEA, OBSTRUCTIVE, MODERATE  04/09/2007  . TB SKIN TEST, POSITIVE 03/28/2007   Past Surgical History:  Procedure Laterality Date  . ROTATOR CUFF REPAIR     Left  . TOTAL KNEE ARTHROPLASTY     x 2    reports that he has never smoked. He has never used smokeless tobacco. He reports that he does not drink alcohol or use drugs. family history includes Asthma in an other family member; Healthy in his mother; Heart disease in his father; Hypertension in his brother; Lupus in his brother. Allergies  Allergen Reactions  . Ace Inhibitors Swelling    Angioedema throat  . Augmentin [Amoxicillin-Pot Clavulanate] Other (See Comments)    Marked weakness with po med x 3 doses  . Doxycycline     Some GI upset  . Sulfa Antibiotics Hives    And facial angioedema   Current Outpatient Medications on File Prior to Visit  Medication Sig Dispense Refill  . acetaminophen (TYLENOL 8 HOUR) 650 MG CR tablet Take 650 mg by mouth every 6 (six) hours as needed (pain/headache).    Marland Kitchen allopurinol (ZYLOPRIM) 100 MG tablet TAKE 1 TABLET(100 MG) BY MOUTH DAILY 90 tablet 1  . amLODipine (NORVASC) 5 MG tablet TAKE 1 TABLET(5 MG) BY MOUTH DAILY 30 tablet 5  . atorvastatin (LIPITOR) 40 MG tablet TAKE 1 TABLET(40 MG) BY MOUTH DAILY 90 tablet 1  . colchicine 0.6 MG tablet Take 1 tablet (0.6 mg total) by mouth daily. For gout pain 30 tablet 1  . diclofenac sodium (VOLTAREN) 1 % GEL Apply 4 g topically 4 (four) times daily. 100 g 12  . furosemide (LASIX) 40 MG tablet TAKE  1 TABLET BY MOUTH EVERY MORNING AND 1 TABLET IN THE EVENING AS NEEDED FOR SWELLING ONLY 60 tablet 5  . HYDROcodone-acetaminophen (NORCO) 7.5-325 MG tablet Take 1-2 tablets by mouth every 6 (six) hours as needed for moderate pain. 20 tablet 0  . lidocaine (LIDODERM) 5 % Place 1 patch onto the skin daily. Apply patch to the left shoulder area. Remove & Discard patch within 12 hours or as directed by MD 14 patch 0  . Nebivolol HCl (BYSTOLIC) 20 MG TABS 1 tab by mouth daily 90 tablet 3  .  pantoprazole (PROTONIX) 40 MG tablet Take 1 tablet (40 mg total) by mouth daily. 90 tablet 3  . predniSONE (DELTASONE) 10 MG tablet 3 tabs by mouth per day for 3 days,2tabs per day for 3 days,1tab per day for 3 days 18 tablet 0  . tamsulosin (FLOMAX) 0.4 MG CAPS capsule Take 1 capsule (0.4 mg total) by mouth 2 (two) times daily. 180 capsule 1  . tiZANidine (ZANAFLEX) 2 MG tablet Take 1 tablet (2 mg total) by mouth every 6 (six) hours as needed for muscle spasms. 40 tablet 1   No current facility-administered medications on file prior to visit.    Observations/Objective: Alert, NAD, appropriate mood and affect, resps normal, cn 2-12 intact, moves all 4s, no visible rash or swelling Lab Results  Component Value Date   WBC 5.1 08/19/2018   HGB 13.0 08/19/2018   HCT 40.6 08/19/2018   PLT 208.0 08/19/2018   GLUCOSE 75 08/19/2018   CHOL 175 08/19/2018   TRIG 65.0 08/19/2018   HDL 52.60 08/19/2018   LDLDIRECT 156.9 10/10/2010   LDLCALC 110 (H) 08/19/2018   ALT 14 08/19/2018   AST 15 08/19/2018   NA 142 08/19/2018   K 5.1 08/19/2018   CL 106 08/19/2018   CREATININE 1.97 (H) 08/19/2018   BUN 29 (H) 08/19/2018   CO2 32 08/19/2018   TSH 1.97 08/19/2018   PSA 1.11 08/19/2018   HGBA1C 5.8 08/19/2018   Assessment and Plan: See notes  Follow Up Instructions: See notes   I discussed the assessment and treatment plan with the patient. The patient was provided an opportunity to ask questions and all were answered. The patient agreed with the plan and demonstrated an understanding of the instructions.   The patient was advised to call back or seek an in-person evaluation if the symptoms worsen or if the condition fails to improve as anticipated.  Cathlean Cower, MD

## 2019-10-16 ENCOUNTER — Encounter: Payer: Self-pay | Admitting: Internal Medicine

## 2019-10-16 ENCOUNTER — Other Ambulatory Visit: Payer: Self-pay

## 2019-10-16 ENCOUNTER — Ambulatory Visit (INDEPENDENT_AMBULATORY_CARE_PROVIDER_SITE_OTHER): Payer: Medicare PPO | Admitting: Internal Medicine

## 2019-10-16 VITALS — BP 160/78 | HR 50 | Temp 98.0°F | Ht 69.5 in | Wt 294.0 lb

## 2019-10-16 DIAGNOSIS — J309 Allergic rhinitis, unspecified: Secondary | ICD-10-CM | POA: Diagnosis not present

## 2019-10-16 DIAGNOSIS — E538 Deficiency of other specified B group vitamins: Secondary | ICD-10-CM | POA: Diagnosis not present

## 2019-10-16 DIAGNOSIS — E559 Vitamin D deficiency, unspecified: Secondary | ICD-10-CM | POA: Diagnosis not present

## 2019-10-16 DIAGNOSIS — E611 Iron deficiency: Secondary | ICD-10-CM | POA: Diagnosis not present

## 2019-10-16 DIAGNOSIS — Z0001 Encounter for general adult medical examination with abnormal findings: Secondary | ICD-10-CM

## 2019-10-16 DIAGNOSIS — N183 Chronic kidney disease, stage 3 unspecified: Secondary | ICD-10-CM | POA: Diagnosis not present

## 2019-10-16 DIAGNOSIS — R609 Edema, unspecified: Secondary | ICD-10-CM | POA: Diagnosis not present

## 2019-10-16 DIAGNOSIS — I10 Essential (primary) hypertension: Secondary | ICD-10-CM | POA: Diagnosis not present

## 2019-10-16 DIAGNOSIS — R7302 Impaired glucose tolerance (oral): Secondary | ICD-10-CM | POA: Diagnosis not present

## 2019-10-16 LAB — HEPATIC FUNCTION PANEL
ALT: 13 U/L (ref 0–53)
AST: 15 U/L (ref 0–37)
Albumin: 3.3 g/dL — ABNORMAL LOW (ref 3.5–5.2)
Alkaline Phosphatase: 108 U/L (ref 39–117)
Bilirubin, Direct: 0.1 mg/dL (ref 0.0–0.3)
Total Bilirubin: 0.4 mg/dL (ref 0.2–1.2)
Total Protein: 6.3 g/dL (ref 6.0–8.3)

## 2019-10-16 LAB — BASIC METABOLIC PANEL
BUN: 21 mg/dL (ref 6–23)
CO2: 28 mEq/L (ref 19–32)
Calcium: 8.6 mg/dL (ref 8.4–10.5)
Chloride: 108 mEq/L (ref 96–112)
Creatinine, Ser: 1.6 mg/dL — ABNORMAL HIGH (ref 0.40–1.50)
GFR: 51.48 mL/min — ABNORMAL LOW (ref >60.00)
Glucose, Bld: 65 mg/dL — ABNORMAL LOW (ref 70–99)
Potassium: 3.5 mEq/L (ref 3.5–5.1)
Sodium: 142 mEq/L (ref 135–145)

## 2019-10-16 LAB — URINALYSIS, ROUTINE W REFLEX MICROSCOPIC
Bilirubin Urine: NEGATIVE
Hgb urine dipstick: NEGATIVE
Ketones, ur: NEGATIVE
Leukocytes,Ua: NEGATIVE
Nitrite: NEGATIVE
Specific Gravity, Urine: 1.02 (ref 1.000–1.030)
Total Protein, Urine: 100 — AB
Urine Glucose: NEGATIVE
Urobilinogen, UA: 0.2 (ref 0.0–1.0)
pH: 6 (ref 5.0–8.0)

## 2019-10-16 LAB — CBC WITH DIFFERENTIAL/PLATELET
Basophils Absolute: 0 10*3/uL (ref 0.0–0.1)
Basophils Relative: 0.5 % (ref 0.0–3.0)
Eosinophils Absolute: 0.3 10*3/uL (ref 0.0–0.7)
Eosinophils Relative: 5.1 % — ABNORMAL HIGH (ref 0.0–5.0)
HCT: 36.2 % — ABNORMAL LOW (ref 39.0–52.0)
Hemoglobin: 11.5 g/dL — ABNORMAL LOW (ref 13.0–17.0)
Lymphocytes Relative: 33.8 % (ref 12.0–46.0)
Lymphs Abs: 1.8 10*3/uL (ref 0.7–4.0)
MCHC: 31.7 g/dL (ref 30.0–36.0)
MCV: 84.7 fl (ref 78.0–100.0)
Monocytes Absolute: 0.9 10*3/uL (ref 0.1–1.0)
Monocytes Relative: 16.7 % — ABNORMAL HIGH (ref 3.0–12.0)
Neutro Abs: 2.3 10*3/uL (ref 1.4–7.7)
Neutrophils Relative %: 43.9 % (ref 43.0–77.0)
Platelets: 205 10*3/uL (ref 150.0–400.0)
RBC: 4.27 Mil/uL (ref 4.22–5.81)
RDW: 14.8 % (ref 11.5–15.5)
WBC: 5.2 10*3/uL (ref 4.0–10.5)

## 2019-10-16 LAB — LIPID PANEL
Cholesterol: 166 mg/dL (ref 0–200)
HDL: 51.1 mg/dL (ref >39.00)
LDL Cholesterol: 103 mg/dL — ABNORMAL HIGH (ref 0–99)
NonHDL: 115.38
Total CHOL/HDL Ratio: 3
Triglycerides: 60 mg/dL (ref 0.0–149.0)
VLDL: 12 mg/dL (ref 0.0–40.0)

## 2019-10-16 LAB — TSH: TSH: 2.29 u[IU]/mL (ref 0.35–4.50)

## 2019-10-16 LAB — PSA: PSA: 1.12 ng/mL (ref 0.10–4.00)

## 2019-10-16 LAB — IBC PANEL
Iron: 23 ug/dL — ABNORMAL LOW (ref 42–165)
Saturation Ratios: 9.8 % — ABNORMAL LOW (ref 20.0–50.0)
Transferrin: 167 mg/dL — ABNORMAL LOW (ref 212.0–360.0)

## 2019-10-16 LAB — BRAIN NATRIURETIC PEPTIDE: Pro B Natriuretic peptide (BNP): 445 pg/mL — ABNORMAL HIGH (ref 0.0–100.0)

## 2019-10-16 LAB — VITAMIN B12: Vitamin B-12: 196 pg/mL — ABNORMAL LOW (ref 211–911)

## 2019-10-16 LAB — VITAMIN D 25 HYDROXY (VIT D DEFICIENCY, FRACTURES): VITD: 16.93 ng/mL — ABNORMAL LOW (ref 30.00–100.00)

## 2019-10-16 LAB — HEMOGLOBIN A1C: Hgb A1c MFr Bld: 5.2 % (ref 4.6–6.5)

## 2019-10-16 MED ORDER — TRIAMCINOLONE ACETONIDE 55 MCG/ACT NA AERO: 2.0000 | INHALATION_SPRAY | Freq: Every day | NASAL | 12 refills | Status: DC

## 2019-10-16 MED ORDER — FUROSEMIDE 40 MG PO TABS: ORAL_TABLET | ORAL | 3 refills | Status: DC

## 2019-10-16 MED ORDER — PREDNISONE 10 MG PO TABS: ORAL_TABLET | ORAL | 0 refills | Status: DC

## 2019-10-16 MED ORDER — METHYLPREDNISOLONE ACETATE 80 MG/ML IJ SUSP
80.0000 mg | Freq: Once | INTRAMUSCULAR | Status: AC
  Administered 2019-10-16: 80 mg via INTRAMUSCULAR

## 2019-10-16 NOTE — Patient Instructions (Signed)
Ok to take 1 and 1/2 of the lasix twice per day  You had the steroid shot today  Please take all new medication as prescribed - the prednisone, and nasacort  Please continue all other medications as before, and refills have been done if requested.  Please have the pharmacy call with any other refills you may need.  Please continue your efforts at being more active, low cholesterol diet, and weight control.  You are otherwise up to date with prevention measures today.  Please keep your appointments with your specialists as you may have planned  Please go to the LAB at the blood drawing area for the tests to be done  You will be contacted by phone if any changes need to be made immediately.  Otherwise, you will receive a letter about your results with an explanation, but please check with MyChart first.  Please remember to sign up for MyChart if you have not done so, as this will be important to you in the future with finding out test results, communicating by private email, and scheduling acute appointments online when needed.  Please make an Appointment to return in 6 months, or sooner if needed

## 2019-10-16 NOTE — Progress Notes (Signed)
Subjective:    Patient ID: Troy Palmer, male    DOB: 1946-04-19, 75 y.o.   MRN: [id number]  HPI  Here for wellness and f/u;  Overall doing ok;  Pt denies Chest pain, worsening SOB, DOE, wheezing, orthopnea, PND, palpitations, dizziness or syncope.  Pt denies neurological change such as new headache, facial or extremity weakness.  Pt denies polydipsia, polyuria, or low sugar symptoms. Pt states overall good compliance with treatment and medications, good tolerability, and has been trying to follow appropriate diet.  Pt denies worsening depressive symptoms, suicidal ideation or panic. No fever, night sweats, wt loss, loss of appetite, or other constitutional symptoms.  Pt states good ability with ADL's, has low fall risk, home safety reviewed and adequate, no other significant changes in hearing or vision, and only occasionally active with exercise.  Missed renal appt 2020 due to pandemic, today with increased bilateral LE swelling. Bp  140/90 at home. ALso Does have several wks ongoing nasal allergy symptoms with clearish congestion, itch and sneezing, without fever, pain, ST, cough, swelling or wheezing. Past Medical History:  Diagnosis Date  . ALLERGIC RHINITIS 04/09/2007  . ASTHMA 12/04/2007  . BACK PAIN 09/16/2009  . BENIGN PROSTATIC HYPERTROPHY 04/09/2007  . CEREBROVASCULAR ACCIDENT, HX OF 07/17/2010  . COLONIC POLYPS, HX OF 12/04/2007  . CONSTIPATION 09/25/2010  . Degeneration of cervical intervertebral disc 03/28/2007  . DEPRESSION 12/04/2007  . DIVERTICULOSIS, COLON 12/04/2007  . DYSPHAGIA UNSPECIFIED 03/16/2008  . ERECTILE DYSFUNCTION 04/09/2007  . ESOPHAGEAL STRICTURE 05/26/2008  . GERD 12/04/2007  . HEMORRHOIDS, RECURRENT 02/11/2008  . HYPERLIPIDEMIA 12/04/2007  . HYPERTENSION 03/28/2007  . LEG PAIN, BILATERAL 01/25/2010  . LOW BACK PAIN 04/09/2007  . LUNG NODULE 12/04/2007  . Osteoarth NOS-Unspec 03/28/2007  . Other dysphagia 11/21/2009  . Peripheral neuropathy 05/29/2018  . POLYARTHRALGIA  07/13/2008  . Polymyalgia rheumatica ([place]) 07/26/2008  . PVD WITH CLAUDICATION 02/10/2010  . RENAL INSUFFICIENCY 03/15/2010  . SLEEP APNEA, OBSTRUCTIVE, MODERATE 04/09/2007  . TB SKIN TEST, POSITIVE 03/28/2007   Past Surgical History:  Procedure Laterality Date  . ROTATOR CUFF REPAIR     Left  . TOTAL KNEE ARTHROPLASTY     x 2    reports that he has never smoked. He has never used smokeless tobacco. He reports that he does not drink alcohol or use drugs. family history includes Asthma in an other family member; Healthy in his mother; Heart disease in his father; Hypertension in his brother; Lupus in his brother. Allergies  Allergen Reactions  . Ace Inhibitors Swelling    Angioedema throat  . Augmentin [Amoxicillin-Pot Clavulanate] Other (See Comments)    Marked weakness with po med x 3 doses  . Doxycycline     Some GI upset  . Sulfa Antibiotics Hives    And facial angioedema   Current Outpatient Medications on File Prior to Visit  Medication Sig Dispense Refill  . acetaminophen (TYLENOL 8 HOUR) 650 MG CR tablet Take 650 mg by mouth every 6 (six) hours as needed (pain/headache).    Marland Kitchen amLODipine (NORVASC) 5 MG tablet TAKE 1 TABLET(5 MG) BY MOUTH DAILY 30 tablet 5  . atorvastatin (LIPITOR) 40 MG tablet TAKE 1 TABLET(40 MG) BY MOUTH DAILY 90 tablet 1  . colchicine 0.6 MG tablet Take 1 tablet (0.6 mg total) by mouth daily. For gout pain 30 tablet 1  . HYDROcodone-acetaminophen (NORCO) 7.5-325 MG tablet Take 1-2 tablets by mouth every 6 (six) hours as needed for moderate pain. 20 tablet 0  .  lidocaine (LIDODERM) 5 % Place 1 patch onto the skin daily. Apply patch to the left shoulder area. Remove & Discard patch within 12 hours or as directed by MD 14 patch 0  . Nebivolol HCl (BYSTOLIC) 20 MG TABS 1 tab by mouth daily 90 tablet 3  . tamsulosin (FLOMAX) 0.4 MG CAPS capsule Take 1 capsule (0.4 mg total) by mouth 2 (two) times daily. 180 capsule 1  . triamcinolone cream (KENALOG) 0.1 % Apply 1  application topically 2 (two) times daily. 45 g 1  . allopurinol (ZYLOPRIM) 100 MG tablet TAKE 1 TABLET(100 MG) BY MOUTH DAILY (Patient not taking: Reported on 10/16/2019) 90 tablet 1  . pantoprazole (PROTONIX) 40 MG tablet Take 1 tablet (40 mg total) by mouth daily. (Patient not taking: Reported on 10/16/2019) 90 tablet 3   No current facility-administered medications on file prior to visit.   Review of Systems All otherwise neg per pt     Objective:   Physical Exam BP (!) 160/78   Pulse (!) 50   Temp 98 F (36.7 C)   Ht 5' 9.5" (1.765 m)   Wt 294 lb (133.4 kg)   SpO2 99%   BMI 42.79 kg/m  VS noted,  Constitutional: Pt appears in NAD HENT: Head: NCAT.  Right Ear: External ear normal.  Left Ear: External ear normal.  Eyes: . Pupils are equal, round, and reactive to light. Conjunctivae and EOM are normal Nose: without d/c or deformity Neck: Neck supple. Gross normal ROM Cardiovascular: Normal rate and regular rhythm.   Pulmonary/Chest: Effort normal and breath sounds without rales or wheezing.  Abd:  Soft, NT, ND, + BS, no organomegaly Neurological: Pt is alert. At baseline orientation, motor grossly intact Skin: Skin is warm. No rashes, other new lesions, 2+ bilat LE edema Psychiatric: Pt behavior is normal without agitation  All otherwise neg per pt  Lab Results  Component Value Date   WBC 5.2 10/16/2019   HGB 11.5 (L) 10/16/2019   HCT 36.2 (L) 10/16/2019   PLT 205.0 10/16/2019   GLUCOSE 65 (L) 10/16/2019   CHOL 166 10/16/2019   TRIG 60.0 10/16/2019   HDL 51.10 10/16/2019   LDLDIRECT 156.9 10/10/2010   LDLCALC 103 (H) 10/16/2019   ALT 13 10/16/2019   AST 15 10/16/2019   NA 142 10/16/2019   K 3.5 10/16/2019   CL 108 10/16/2019   CREATININE 1.60 (H) 10/16/2019   BUN 21 10/16/2019   CO2 28 10/16/2019   TSH 2.29 10/16/2019   PSA 1.12 10/16/2019   HGBA1C 5.2 10/16/2019      Assessment & Plan:

## 2019-10-17 ENCOUNTER — Encounter: Payer: Self-pay | Admitting: Internal Medicine

## 2019-10-17 ENCOUNTER — Other Ambulatory Visit: Payer: Self-pay | Admitting: Internal Medicine

## 2019-10-17 MED ORDER — VITAMIN B-12 1000 MCG PO TABS: 1000.0000 ug | ORAL_TABLET | Freq: Every day | ORAL | 1 refills | Status: DC

## 2019-10-17 MED ORDER — VITAMIN D (ERGOCALCIFEROL) 1.25 MG (50000 UNIT) PO CAPS: 50000.0000 [IU] | ORAL_CAPSULE | ORAL | 0 refills | Status: DC

## 2019-10-17 NOTE — Assessment & Plan Note (Addendum)
For increased lasix 60 bid  I spent 32 miinutes in addition to time for wellness examination in preparing to see the patient by review of recent labs, imaging and procedures, obtaining and reviewing separately obtained history, communicating with the patient and family or caregiver, ordering medications, tests or procedures, and documenting clinical information in the EHR including the differential Dx, treatment, and any further evaluation and other management of edema, hyperglylcemia, HTN, ckd, allergic rhinitis

## 2019-10-17 NOTE — Assessment & Plan Note (Signed)
Mild to mod, for depomedrol IM 80, predpac asd, and nasacort asd,  to f/u any worsening symptoms or concerns

## 2019-10-17 NOTE — Assessment & Plan Note (Signed)

## 2019-10-17 NOTE — Assessment & Plan Note (Signed)
Pt to f/u with renal with new appt

## 2019-10-17 NOTE — Assessment & Plan Note (Signed)
stable overall by history and exam, recent data reviewed with pt, and pt to continue medical treatment as before,  to f/u any worsening symptoms or concerns  

## 2019-10-20 ENCOUNTER — Other Ambulatory Visit: Payer: Self-pay | Admitting: Internal Medicine

## 2019-10-20 MED ORDER — FLUTICASONE PROPIONATE 50 MCG/ACT NA SUSP: 2.0000 | Freq: Every day | NASAL | 6 refills | Status: DC

## 2019-11-12 ENCOUNTER — Other Ambulatory Visit: Payer: Self-pay | Admitting: Internal Medicine

## 2019-11-12 DIAGNOSIS — M109 Gout, unspecified: Secondary | ICD-10-CM | POA: Diagnosis not present

## 2019-11-12 DIAGNOSIS — I129 Hypertensive chronic kidney disease with stage 1 through stage 4 chronic kidney disease, or unspecified chronic kidney disease: Secondary | ICD-10-CM | POA: Diagnosis not present

## 2019-11-12 DIAGNOSIS — N183 Chronic kidney disease, stage 3 unspecified: Secondary | ICD-10-CM | POA: Diagnosis not present

## 2019-11-12 NOTE — Telephone Encounter (Signed)
Please refill as per office routine med refill policy (all routine meds refilled for 3 mo or monthly per pt preference up to one year from last visit, then month to month grace period for 3 mo, then further med refills will have to be denied)  

## 2019-11-14 ENCOUNTER — Emergency Department (HOSPITAL_COMMUNITY)
Admission: EM | Admit: 2019-11-14 | Discharge: 2019-11-14 | Disposition: A | Discharge status: Home / Self Care | Payer: Medicare Other | Attending: Emergency Medicine | Admitting: Emergency Medicine

## 2019-11-14 ENCOUNTER — Other Ambulatory Visit: Payer: Self-pay

## 2019-11-14 DIAGNOSIS — I10 Essential (primary) hypertension: Secondary | ICD-10-CM | POA: Insufficient documentation

## 2019-11-14 DIAGNOSIS — R519 Headache, unspecified: Secondary | ICD-10-CM | POA: Insufficient documentation

## 2019-11-14 DIAGNOSIS — Z5321 Procedure and treatment not carried out due to patient leaving prior to being seen by health care provider: Secondary | ICD-10-CM | POA: Diagnosis not present

## 2019-11-14 NOTE — ED Notes (Signed)
Patient stated he did not want to stay after getting blood pressure checked. Patient stated he thought blood pressure was high and he has had headache for three days . Writer offered to change complaint to headache so he could be evaluated for headache. Patient stated he would just go home and take tylenol. Advised patient if he has any worsening symptoms to return to ED.

## 2019-11-16 ENCOUNTER — Other Ambulatory Visit: Payer: Self-pay | Admitting: Internal Medicine

## 2019-11-16 ENCOUNTER — Telehealth: Payer: Self-pay

## 2019-11-16 NOTE — Telephone Encounter (Signed)
Please refill as per office routine med refill policy (all routine meds refilled for 3 mo or monthly per pt preference up to one year from last visit, then month to month grace period for 3 mo, then further med refills will have to be denied)  

## 2019-11-16 NOTE — Telephone Encounter (Signed)
New message    The patient called C/o consistent headache think his blood pressure is high   The patient does not have his blood pressure reading recall having his blood pressure check on Saturday.   The patient thinks it's not COVID relate.   Spoke with the CMA on the direction with covid screening questions advise having the patient speak with triage team health for assessment.   Call transfer over to [facility].

## 2019-11-17 ENCOUNTER — Encounter: Payer: Self-pay | Admitting: Family

## 2019-11-17 ENCOUNTER — Other Ambulatory Visit: Payer: Self-pay

## 2019-11-17 ENCOUNTER — Ambulatory Visit (INDEPENDENT_AMBULATORY_CARE_PROVIDER_SITE_OTHER): Payer: Medicare Other | Admitting: Family

## 2019-11-17 VITALS — BP 160/70 | HR 60 | Temp 98.1°F

## 2019-11-17 DIAGNOSIS — I10 Essential (primary) hypertension: Secondary | ICD-10-CM

## 2019-11-17 DIAGNOSIS — J309 Allergic rhinitis, unspecified: Secondary | ICD-10-CM | POA: Diagnosis not present

## 2019-11-17 MED ORDER — METHYLPREDNISOLONE ACETATE 40 MG/ML IJ SUSP
40.0000 mg | Freq: Once | INTRAMUSCULAR | Status: AC
  Administered 2019-11-17: 40 mg via INTRAMUSCULAR

## 2019-11-17 NOTE — Progress Notes (Signed)
Troy Palmer is a 74 y.o. male with the following history as recorded in [employer]:  Patient Active Problem List   Diagnosis Date Noted  . Contact dermatitis 09/08/2019  . Left knee pain 06/04/2019  . Neck pain 02/23/2019  . Recurrent falls 08/19/2018  . Abdominal pain 07/25/2018  . Peripheral neuropathy 05/29/2018  . Paresthesia of bilateral legs 05/05/2018  . Cough variant asthma  vs UACS assoc with pnds 02/27/2018  . Asthmatic bronchitis 02/10/2018  . Acute respiratory failure with hypoxia ([place]) 02/09/2018  . Pain, dental 02/03/2018  . Cough 02/03/2018  . Wheezing 02/03/2018  . Pain and swelling of lower leg, right 12/31/2017  . BPH with obstruction/lower urinary tract symptoms 05/16/2017  . Bilateral ankle pain 05/16/2017  . Acute gout 02/26/2017  . DOE (dyspnea on exertion) 11/16/2016  . Complete tear of right rotator cuff 06/26/2016  . Cellulitis of deltoid region 06/26/2016  . Greater trochanteric bursitis of right hip 05/05/2014  . Right hip pain 05/04/2014  . Posterior neck pain 12/22/2013  . Orchitis, right 12/22/2013  . Paresthesia of both feet 12/22/2013  . Bilateral shoulder pain 09/16/2013  . Allergic angioedema 07/31/2012  . Impaired glucose tolerance 04/15/2012  . Chest pain 02/24/2012  . Foot pain, bilateral 02/20/2012  . Leg pain, bilateral 01/19/2012  . Edema 01/16/2012  . Dysphagia 11/21/2011  . Gout 06/07/2011  . CKD (chronic kidney disease) 06/07/2011  . Encounter for well adult exam with abnormal findings 02/14/2011  . Encounter for long-term (current) use of other medications 11/08/2010  . CONSTIPATION 09/25/2010  . Memory loss 06/28/2010  . PVD (peripheral vascular disease) ([place]) 02/10/2010  . LEG PAIN, BILATERAL 01/25/2010  . Backache 09/16/2009  . Polymyalgia rheumatica ([place]) 07/26/2008  . POLYARTHRALGIA 07/13/2008  . ESOPHAGEAL STRICTURE 05/26/2008  . HEMORRHOIDS, RECURRENT 02/11/2008  . Hyperlipidemia 12/04/2007  . DEPRESSION  12/04/2007  . Asthma 12/04/2007  . LUNG NODULE 12/04/2007  . GERD 12/04/2007  . DIVERTICULOSIS, COLON 12/04/2007  . COLONIC POLYPS, HX OF 12/04/2007  . ERECTILE DYSFUNCTION 04/09/2007  . SLEEP APNEA, OBSTRUCTIVE, MODERATE 04/09/2007  . Allergic rhinitis 04/09/2007  . BENIGN PROSTATIC HYPERTROPHY 04/09/2007  . LOW BACK PAIN 04/09/2007  . Morbid obesity due to excess calories ([place]) 03/28/2007  . Hypertension 03/28/2007  . Osteoarth NOS-Unspec 03/28/2007  . Degeneration of cervical intervertebral disc 03/28/2007  . TB SKIN TEST, POSITIVE 03/28/2007    Current Outpatient Medications  Medication Sig Dispense Refill  . acetaminophen (TYLENOL 8 HOUR) 650 MG CR tablet Take 650 mg by mouth every 6 (six) hours as needed (pain/headache).    Marland Kitchen allopurinol (ZYLOPRIM) 100 MG tablet TAKE 1 TABLET(100 MG) BY MOUTH DAILY (Patient not taking: Reported on 10/16/2019) 90 tablet 1  . amLODipine (NORVASC) 5 MG tablet TAKE 1 TABLET(5 MG) BY MOUTH DAILY 30 tablet 5  . atorvastatin (LIPITOR) 40 MG tablet TAKE 1 TABLET(40 MG) BY MOUTH DAILY 90 tablet 1  . colchicine 0.6 MG tablet Take 1 tablet (0.6 mg total) by mouth daily. For gout pain 30 tablet 1  . fluticasone (FLONASE) 50 MCG/ACT nasal spray Place 2 sprays into both nostrils daily. 16 g 6  . furosemide (LASIX) 40 MG tablet Take 1 and 1/2 tabs by mouth twice per day 270 tablet 3  . HYDROcodone-acetaminophen (NORCO) 7.5-325 MG tablet Take 1-2 tablets by mouth every 6 (six) hours as needed for moderate pain. 20 tablet 0  . lidocaine (LIDODERM) 5 % Place 1 patch onto the skin daily. Apply patch to the  left shoulder area. Remove & Discard patch within 12 hours or as directed by MD 14 patch 0  . Nebivolol HCl (BYSTOLIC) 20 MG TABS 1 tab by mouth daily 90 tablet 3  . pantoprazole (PROTONIX) 40 MG tablet Take 1 tablet (40 mg total) by mouth daily. (Patient not taking: Reported on 10/16/2019) 90 tablet 3  . predniSONE (DELTASONE) 10 MG tablet 3 tabs by mouth per day for  3 days,2tabs per day for 3 days,1tab per day for 3 days 18 tablet 0  . tamsulosin (FLOMAX) 0.4 MG CAPS capsule Take 1 capsule (0.4 mg total) by mouth 2 (two) times daily. 180 capsule 1  . triamcinolone cream (KENALOG) 0.1 % Apply 1 application topically 2 (two) times daily. 45 g 1  . vitamin B-12 (CYANOCOBALAMIN) 1000 MCG tablet Take 1 tablet (1,000 mcg total) by mouth daily. 90 tablet 1  . Vitamin D, Ergocalciferol, (DRISDOL) 1.25 MG (50000 UNIT) CAPS capsule Take 1 capsule (50,000 Units total) by mouth every 7 (seven) days. 12 capsule 0   No current facility-administered medications for this visit.    Allergies: Ace inhibitors, Augmentin [amoxicillin-pot clavulanate], Doxycycline, and Sulfa antibiotics  Past Medical History:  Diagnosis Date  . ALLERGIC RHINITIS 04/09/2007  . ASTHMA 12/04/2007  . BACK PAIN 09/16/2009  . BENIGN PROSTATIC HYPERTROPHY 04/09/2007  . CEREBROVASCULAR ACCIDENT, HX OF 07/17/2010  . COLONIC POLYPS, HX OF 12/04/2007  . CONSTIPATION 09/25/2010  . Degeneration of cervical intervertebral disc 03/28/2007  . DEPRESSION 12/04/2007  . DIVERTICULOSIS, COLON 12/04/2007  . DYSPHAGIA UNSPECIFIED 03/16/2008  . ERECTILE DYSFUNCTION 04/09/2007  . ESOPHAGEAL STRICTURE 05/26/2008  . GERD 12/04/2007  . HEMORRHOIDS, RECURRENT 02/11/2008  . HYPERLIPIDEMIA 12/04/2007  . HYPERTENSION 03/28/2007  . LEG PAIN, BILATERAL 01/25/2010  . LOW BACK PAIN 04/09/2007  . LUNG NODULE 12/04/2007  . Osteoarth NOS-Unspec 03/28/2007  . Other dysphagia 11/21/2009  . Peripheral neuropathy 05/29/2018  . POLYARTHRALGIA 07/13/2008  . Polymyalgia rheumatica ([place]) 07/26/2008  . PVD WITH CLAUDICATION 02/10/2010  . RENAL INSUFFICIENCY 03/15/2010  . SLEEP APNEA, OBSTRUCTIVE, MODERATE 04/09/2007  . TB SKIN TEST, POSITIVE 03/28/2007    Past Surgical History:  Procedure Laterality Date  . ROTATOR CUFF REPAIR     Left  . TOTAL KNEE ARTHROPLASTY     x 2    Family History  Problem Relation Age of Onset  . Healthy Mother   .  Heart disease Father   . Lupus Brother   . Hypertension Brother   . Asthma Other     Social History   Tobacco Use  . Smoking status: Never Smoker  . Smokeless tobacco: Never Used  Substance Use Topics  . Alcohol use: No    Subjective:  Follow up on hypertension; has been having increased "nagging headaches" recently; was told at his CPE in early March that his pressure was elevated- had Lasix increased to 60 mg bid; Denies any chest pain, shortness of breath, blurred vision. Admits has been eating increased salt recently;  Patient is also requesting a steroid shot today to help with his allergies- notes that his PCP gives it to him regularly during allergy season.    Objective:  Vitals:   11/17/19 0913  BP: (!) 160/70  Pulse: 60  Temp: 98.1 F (36.7 C)  TempSrc: Oral    General: Well developed, well nourished, in no acute distress  Skin : Warm and dry.  Head: Normocephalic and atraumatic  Lungs: Respirations unlabored; clear to auscultation bilaterally without wheeze, rales, rhonchi  CVS  exam: normal rate and regular rhythm.  Neurologic: Alert and oriented; speech intact; face symmetrical; moves all extremities well; CNII-XII intact without focal deficit   Assessment:  1. Essential hypertension   2. Allergic rhinitis, unspecified seasonality, unspecified trigger     Plan:  1. Uncontrolled; increase Amlodipine to 10 mg daily; continue other medications as prescribed; follow-up with his PCP in 2 weeks for re-check. 2. Depo-Medrol IM 40 mg given today; see his PCP with continued symptoms;  This visit occurred during the SARS-CoV-2 public health emergency.  Safety protocols were in place, including screening questions prior to the visit, additional usage of staff PPE, and extensive cleaning of exam room while observing appropriate contact time as indicated for disinfecting solutions.     Return in about 2 weeks (around 12/01/2019) for with Dr. Jenny Reichmann.  No orders of the defined  types were placed in this encounter.   Requested Prescriptions    No prescriptions requested or ordered in this encounter

## 2019-11-17 NOTE — Addendum Note (Signed)
Addended by: Marcina Millard on: 11/17/2019 02:10 PM   Modules accepted: Orders

## 2019-12-03 ENCOUNTER — Encounter: Payer: Self-pay | Admitting: Internal Medicine

## 2019-12-03 ENCOUNTER — Ambulatory Visit (INDEPENDENT_AMBULATORY_CARE_PROVIDER_SITE_OTHER): Payer: Medicare Other | Admitting: Internal Medicine

## 2019-12-03 ENCOUNTER — Other Ambulatory Visit: Payer: Self-pay

## 2019-12-03 VITALS — BP 140/82 | HR 56 | Temp 97.8°F | Ht 69.0 in | Wt 294.0 lb

## 2019-12-03 DIAGNOSIS — I10 Essential (primary) hypertension: Secondary | ICD-10-CM

## 2019-12-03 DIAGNOSIS — K219 Gastro-esophageal reflux disease without esophagitis: Secondary | ICD-10-CM | POA: Diagnosis not present

## 2019-12-03 DIAGNOSIS — R1013 Epigastric pain: Secondary | ICD-10-CM | POA: Diagnosis not present

## 2019-12-03 DIAGNOSIS — E538 Deficiency of other specified B group vitamins: Secondary | ICD-10-CM

## 2019-12-03 DIAGNOSIS — E559 Vitamin D deficiency, unspecified: Secondary | ICD-10-CM

## 2019-12-03 DIAGNOSIS — R7302 Impaired glucose tolerance (oral): Secondary | ICD-10-CM | POA: Diagnosis not present

## 2019-12-03 DIAGNOSIS — K222 Esophageal obstruction: Secondary | ICD-10-CM

## 2019-12-03 MED ORDER — HYDRALAZINE HCL 25 MG PO TABS: 25.0000 mg | ORAL_TABLET | Freq: Two times a day (BID) | ORAL | 3 refills | Status: DC

## 2019-12-03 MED ORDER — VITAMIN B-12 1000 MCG PO TABS: 1000.0000 ug | ORAL_TABLET | Freq: Every day | ORAL | 1 refills | Status: AC

## 2019-12-03 MED ORDER — PANTOPRAZOLE SODIUM 40 MG PO TBEC: 40.0000 mg | DELAYED_RELEASE_TABLET | Freq: Every day | ORAL | 3 refills | Status: DC

## 2019-12-03 NOTE — Progress Notes (Signed)
Subjective:    Patient ID: Troy Palmer, male    DOB: [birth year], 74 y.o.   MRN: [id number]  HPI    Here to f/u; overall doing ok,  Pt denies chest pain, increasing sob or doe, wheezing, orthopnea, PND, increased LE swelling, palpitations, dizziness or syncope.  Pt denies new neurological symptoms such as new headache, or facial or extremity weakness or numbness.  Pt denies polydipsia, polyuria, or low sugar episode.  Pt states overall good compliance with meds, mostly trying to follow appropriate diet, with wt overall stable,  but little exercise however  BP Readings from Last 3 Encounters:  12/03/19 140/82  11/17/19 (!) 160/70  10/16/19 (!) 160/78  Saw renal this am for BP  S/p EGD with dilation 2012 with Dr Deatra Ina, now with worsening reflux with epigastric discomfort, and intermittent worsening dysphagia  Denies worsening n/v, bowel change or blood.   Past Medical History:  Diagnosis Date  . ALLERGIC RHINITIS 04/09/2007  . ASTHMA 12/04/2007  . BACK PAIN 09/16/2009  . BENIGN PROSTATIC HYPERTROPHY 04/09/2007  . CEREBROVASCULAR ACCIDENT, HX OF 07/17/2010  . COLONIC POLYPS, HX OF 12/04/2007  . CONSTIPATION 09/25/2010  . Degeneration of cervical intervertebral disc 03/28/2007  . DEPRESSION 12/04/2007  . DIVERTICULOSIS, COLON 12/04/2007  . DYSPHAGIA UNSPECIFIED 03/16/2008  . ERECTILE DYSFUNCTION 04/09/2007  . ESOPHAGEAL STRICTURE 05/26/2008  . GERD 12/04/2007  . HEMORRHOIDS, RECURRENT 02/11/2008  . HYPERLIPIDEMIA 12/04/2007  . HYPERTENSION 03/28/2007  . LEG PAIN, BILATERAL 01/25/2010  . LOW BACK PAIN 04/09/2007  . LUNG NODULE 12/04/2007  . Osteoarth NOS-Unspec 03/28/2007  . Other dysphagia 11/21/2009  . Peripheral neuropathy 05/29/2018  . POLYARTHRALGIA 07/13/2008  . Polymyalgia rheumatica ([place]) 07/26/2008  . PVD WITH CLAUDICATION 02/10/2010  . RENAL INSUFFICIENCY 03/15/2010  . SLEEP APNEA, OBSTRUCTIVE, MODERATE 04/09/2007  . TB SKIN TEST, POSITIVE 03/28/2007   Past Surgical History:  Procedure  Laterality Date  . ROTATOR CUFF REPAIR     Left  . TOTAL KNEE ARTHROPLASTY     x 2    reports that he has never smoked. He has never used smokeless tobacco. He reports that he does not drink alcohol or use drugs. family history includes Asthma in an other family member; Healthy in his mother; Heart disease in his father; Hypertension in his brother; Lupus in his brother. Allergies  Allergen Reactions  . Ace Inhibitors Swelling    Angioedema throat  . Augmentin [Amoxicillin-Pot Clavulanate] Other (See Comments)    Marked weakness with po med x 3 doses  . Doxycycline     Some GI upset  . Sulfa Antibiotics Hives    And facial angioedema   Current Outpatient Medications on File Prior to Visit  Medication Sig Dispense Refill  . acetaminophen (TYLENOL 8 HOUR) 650 MG CR tablet Take 650 mg by mouth every 6 (six) hours as needed (pain/headache).    Marland Kitchen allopurinol (ZYLOPRIM) 100 MG tablet TAKE 1 TABLET(100 MG) BY MOUTH DAILY 90 tablet 1  . amLODipine (NORVASC) 5 MG tablet TAKE 1 TABLET(5 MG) BY MOUTH DAILY 30 tablet 5  . atorvastatin (LIPITOR) 40 MG tablet TAKE 1 TABLET(40 MG) BY MOUTH DAILY 90 tablet 1  . colchicine 0.6 MG tablet Take 1 tablet (0.6 mg total) by mouth daily. For gout pain 30 tablet 1  . fluticasone (FLONASE) 50 MCG/ACT nasal spray Place 2 sprays into both nostrils daily. 16 g 6  . furosemide (LASIX) 40 MG tablet Take 1 and 1/2 tabs by mouth twice per day 270  tablet 3  . HYDROcodone-acetaminophen (NORCO) 7.5-325 MG tablet Take 1-2 tablets by mouth every 6 (six) hours as needed for moderate pain. 20 tablet 0  . lidocaine (LIDODERM) 5 % Place 1 patch onto the skin daily. Apply patch to the left shoulder area. Remove & Discard patch within 12 hours or as directed by MD 14 patch 0  . Nebivolol HCl (BYSTOLIC) 20 MG TABS 1 tab by mouth daily 90 tablet 3  . predniSONE (DELTASONE) 10 MG tablet 3 tabs by mouth per day for 3 days,2tabs per day for 3 days,1tab per day for 3 days 18 tablet 0   . tamsulosin (FLOMAX) 0.4 MG CAPS capsule TAKE 1 CAPSULE(0.4 MG) BY MOUTH TWICE DAILY 180 capsule 3  . triamcinolone cream (KENALOG) 0.1 % Apply 1 application topically 2 (two) times daily. 45 g 1  . Vitamin D, Ergocalciferol, (DRISDOL) 1.25 MG (50000 UNIT) CAPS capsule Take 1 capsule (50,000 Units total) by mouth every 7 (seven) days. 12 capsule 0   No current facility-administered medications on file prior to visit.   Review of Systems All otherwise neg per pt '    Objective:   Physical Exam BP 140/82 (BP Location: Left Arm, Patient Position: Sitting, Cuff Size: Large)   Pulse (!) 56   Temp 97.8 F (36.6 C) (Oral)   Ht 5\' 9"  (1.753 m)   Wt 294 lb (133.4 kg)   SpO2 97%   BMI 43.42 kg/m  VS noted,  Constitutional: Pt appears in NAD HENT: Head: NCAT.  Right Ear: External ear normal.  Left Ear: External ear normal.  Eyes: . Pupils are equal, round, and reactive to light. Conjunctivae and EOM are normal Nose: without d/c or deformity Neck: Neck supple. Gross normal ROM Cardiovascular: Normal rate and regular rhythm.   Pulmonary/Chest: Effort normal and breath sounds without rales or wheezing.  Abd:  Soft, NT, ND, + BS, no organomegaly Neurological: Pt is alert. At baseline orientation, motor grossly intact Skin: Skin is warm. No rashes, other new lesions, no LE edema Psychiatric: Pt behavior is normal without agitation  All otherwise neg per pt Lab Results  Component Value Date   WBC 5.2 10/16/2019   HGB 11.5 (L) 10/16/2019   HCT 36.2 (L) 10/16/2019   PLT 205.0 10/16/2019   GLUCOSE 65 (L) 10/16/2019   CHOL 166 10/16/2019   TRIG 60.0 10/16/2019   HDL 51.10 10/16/2019   LDLDIRECT 156.9 10/10/2010   LDLCALC 103 (H) 10/16/2019   ALT 13 10/16/2019   AST 15 10/16/2019   NA 142 10/16/2019   K 3.5 10/16/2019   CL 108 10/16/2019   CREATININE 1.60 (H) 10/16/2019   BUN 21 10/16/2019   CO2 28 10/16/2019   TSH 2.29 10/16/2019   PSA 1.12 10/16/2019   HGBA1C 5.2 10/16/2019       Assessment & Plan:

## 2019-12-03 NOTE — Patient Instructions (Signed)
Please take all new medication as prescribed - hydralazine 25 mg twice per day for blood pressure  Please continue all other medications as before, including to restart the protonix (pantoprozole) for acid  Please have the pharmacy call with any other refills you may need.  Please continue your efforts at being more active, low cholesterol diet, and weight control  Please keep your appointments with your specialists as you may have planned - your kidney doctor  You will be contacted regarding the referral for: gastroenterology - ? Maybe need an EGD (stomach scope); the colonoscopy is not due until early 2022)  Please take Vitamin D 50000 units weekly for 12 weeks, then plan to change to OTC Vitamin D3 at 2000 units per day, indefinitely.  Please take all new medication as prescribed - the B12  Pill - 1 per day for 6 months  Please make an Appointment to return in 2 weeks for blood pressure check

## 2019-12-05 ENCOUNTER — Encounter: Payer: Self-pay | Admitting: Internal Medicine

## 2019-12-05 DIAGNOSIS — E559 Vitamin D deficiency, unspecified: Secondary | ICD-10-CM | POA: Insufficient documentation

## 2019-12-05 DIAGNOSIS — E538 Deficiency of other specified B group vitamins: Secondary | ICD-10-CM | POA: Insufficient documentation

## 2019-12-05 NOTE — Assessment & Plan Note (Signed)
For oral replacement 

## 2019-12-05 NOTE — Assessment & Plan Note (Addendum)
To restart PPI,  to f/u any worsening symptoms or concerns  I spent 43 minutes in preparing to see the patient by review of recent labs, imaging and procedures, obtaining and reviewing separately obtained history, communicating with the patient and family or caregiver, ordering medications, tests or procedures, and documenting clinical information in the EHR including the differential Dx, treatment, and any further evaluation and other management of hyperglycemia, htN, gerd, esoph stricture, epigastric paiin, vit d and b12 deficiency

## 2019-12-05 NOTE — Assessment & Plan Note (Signed)
stable overall by history and exam, recent data reviewed with pt, and pt to continue medical treatment as before,  to f/u any worsening symptoms or concerns  

## 2019-12-05 NOTE — Assessment & Plan Note (Signed)
As above for ppi and gi referral

## 2019-12-05 NOTE — Assessment & Plan Note (Signed)
Uncontrolled, for add hydralazine 25 bid,  to f/u any worsening symptoms or concerns, f/u bp at home and next visit

## 2019-12-05 NOTE — Assessment & Plan Note (Signed)
With recent dysphagia, for refer gi may need repeat egd

## 2019-12-12 ENCOUNTER — Other Ambulatory Visit: Payer: Self-pay | Admitting: Internal Medicine

## 2019-12-12 NOTE — Telephone Encounter (Signed)
Please refill as per office routine med refill policy (all routine meds refilled for 3 mo or monthly per pt preference up to one year from last visit, then month to month grace period for 3 mo, then further med refills will have to be denied)  

## 2019-12-25 ENCOUNTER — Telehealth: Payer: Self-pay | Admitting: Internal Medicine

## 2019-12-25 NOTE — Telephone Encounter (Signed)
New message:   Pt is calling to see what the status is on something for his drainage or if he needs and appt. Please advise.

## 2019-12-25 NOTE — Telephone Encounter (Signed)
   Patient requesting advice for allergy medication. Symptoms: drainage No fever No cough  Declined appointment at this time, seeking advice

## 2019-12-28 NOTE — Telephone Encounter (Signed)
Ok for [employer] 180 mg daily prn, and otc nasacort daily as well

## 2019-12-31 NOTE — Telephone Encounter (Signed)
Called pt and left VM of details for OTC meds per Dr. Jenny Reichmann.

## 2019-12-31 NOTE — Telephone Encounter (Signed)
   Patient states OTC medications are too costly. Requesting a prescription medication Appointment scheduled for 5/27

## 2020-01-01 MED ORDER — AZELASTINE-FLUTICASONE 137-50 MCG/ACT NA SUSP: NASAL | 11 refills | Status: DC

## 2020-01-01 MED ORDER — LEVOCETIRIZINE DIHYDROCHLORIDE 5 MG PO TABS: 5.0000 mg | ORAL_TABLET | Freq: Every evening | ORAL | 11 refills | Status: DC

## 2020-01-01 NOTE — Addendum Note (Signed)
Addended by: Biagio Borg on: 01/01/2020 12:53 PM   Modules accepted: Orders

## 2020-01-01 NOTE — Telephone Encounter (Signed)
[place] for xyzal and dysmista - done erx  If these are not covered, pt would need to determine at the pharmacy which similar meds are covered if these need to be changed

## 2020-01-04 ENCOUNTER — Encounter: Payer: Self-pay | Admitting: Internal Medicine

## 2020-01-07 ENCOUNTER — Ambulatory Visit (INDEPENDENT_AMBULATORY_CARE_PROVIDER_SITE_OTHER): Payer: Medicare Other | Admitting: Internal Medicine

## 2020-01-07 ENCOUNTER — Encounter: Payer: Self-pay | Admitting: Internal Medicine

## 2020-01-07 VITALS — BP 138/70 | HR 77 | Temp 98.2°F | Ht 69.0 in | Wt 291.0 lb

## 2020-01-07 DIAGNOSIS — R7302 Impaired glucose tolerance (oral): Secondary | ICD-10-CM | POA: Diagnosis not present

## 2020-01-07 DIAGNOSIS — E559 Vitamin D deficiency, unspecified: Secondary | ICD-10-CM | POA: Diagnosis not present

## 2020-01-07 DIAGNOSIS — N183 Chronic kidney disease, stage 3 unspecified: Secondary | ICD-10-CM | POA: Diagnosis not present

## 2020-01-07 DIAGNOSIS — E538 Deficiency of other specified B group vitamins: Secondary | ICD-10-CM

## 2020-01-07 DIAGNOSIS — E782 Mixed hyperlipidemia: Secondary | ICD-10-CM

## 2020-01-07 DIAGNOSIS — J309 Allergic rhinitis, unspecified: Secondary | ICD-10-CM

## 2020-01-07 DIAGNOSIS — I10 Essential (primary) hypertension: Secondary | ICD-10-CM

## 2020-01-07 MED ORDER — LEVOCETIRIZINE DIHYDROCHLORIDE 5 MG PO TABS: 5.0000 mg | ORAL_TABLET | Freq: Every evening | ORAL | 11 refills | Status: DC

## 2020-01-07 MED ORDER — AZELASTINE HCL 0.1 % NA SOLN: 2.0000 | Freq: Two times a day (BID) | NASAL | 12 refills | Status: DC

## 2020-01-07 MED ORDER — METHYLPREDNISOLONE ACETATE 80 MG/ML IJ SUSP
80.0000 mg | Freq: Once | INTRAMUSCULAR | Status: AC
  Administered 2020-01-07: 80 mg via INTRAMUSCULAR

## 2020-01-07 MED ORDER — PREDNISONE 10 MG PO TABS: ORAL_TABLET | ORAL | 0 refills | Status: DC

## 2020-01-07 NOTE — Assessment & Plan Note (Signed)
For f/u lab, cont oral replacement

## 2020-01-07 NOTE — Assessment & Plan Note (Addendum)
Cont wt control

## 2020-01-07 NOTE — Assessment & Plan Note (Signed)
stable overall by history and exam, recent data reviewed with pt, and pt to continue medical treatment as before,  to f/u any worsening symptoms or concerns  

## 2020-01-07 NOTE — Patient Instructions (Signed)
You had the steroid shot today  Please take all new medication as prescribed - the xyzal 5 mg daily and azelastine nasal spray, and prednisone for allergies  Please continue all other medications as before, and refills have been done if requested.  Please have the pharmacy call with any other refills you may need.  Please continue your efforts at being more active, low cholesterol diet, and weight control.  Please keep your appointments with your specialists as you may have planned  Please take OTC Vitamin D3 at 2000 units per day, indefinitely.  Please take all new medication as recommended as well - the B12 supplement

## 2020-01-07 NOTE — Assessment & Plan Note (Signed)
stalbe

## 2020-01-07 NOTE — Assessment & Plan Note (Deleted)

## 2020-01-07 NOTE — Assessment & Plan Note (Signed)
For oral replacement, f/u lab

## 2020-01-07 NOTE — Assessment & Plan Note (Signed)
stable overall by history and exam, recent data reviewed with pt, and pt to continue medical treatment as before,  to f/u any worsening symptoms or concerns, for f/u a1c

## 2020-01-07 NOTE — Progress Notes (Signed)
Subjective:    Patient ID: Troy Palmer, male    DOB: Jul 04, 1946, 74 y.o.   MRN: [id number]  HPI  Here to f/u; overall doing ok,  Pt denies chest pain, increasing sob or doe, wheezing, orthopnea, PND, increased LE swelling, palpitations, dizziness or syncope.  Pt denies new neurological symptoms such as new headache, or facial or extremity weakness or numbness.  Pt denies polydipsia, polyuria, or low sugar episode.  Pt states overall good compliance with meds, mostly trying to follow appropriate diet, with wt overall stable,  but little exercise however.Does have several wks ongoing nasal allergy symptoms with clearish congestion, itch and sneezing, without fever, pain, ST, cough, swelling or wheezing..  Due for f/u vit d level Past Medical History:  Diagnosis Date  . ALLERGIC RHINITIS 04/09/2007  . ASTHMA 12/04/2007  . BACK PAIN 09/16/2009  . BENIGN PROSTATIC HYPERTROPHY 04/09/2007  . CEREBROVASCULAR ACCIDENT, HX OF 07/17/2010  . COLONIC POLYPS, HX OF 12/04/2007  . CONSTIPATION 09/25/2010  . Degeneration of cervical intervertebral disc 03/28/2007  . DEPRESSION 12/04/2007  . DIVERTICULOSIS, COLON 12/04/2007  . DYSPHAGIA UNSPECIFIED 03/16/2008  . ERECTILE DYSFUNCTION 04/09/2007  . ESOPHAGEAL STRICTURE 05/26/2008  . GERD 12/04/2007  . HEMORRHOIDS, RECURRENT 02/11/2008  . HYPERLIPIDEMIA 12/04/2007  . HYPERTENSION 03/28/2007  . LEG PAIN, BILATERAL 01/25/2010  . LOW BACK PAIN 04/09/2007  . LUNG NODULE 12/04/2007  . Osteoarth NOS-Unspec 03/28/2007  . Other dysphagia 11/21/2009  . Peripheral neuropathy 05/29/2018  . POLYARTHRALGIA 07/13/2008  . Polymyalgia rheumatica ([place]) 07/26/2008  . PVD WITH CLAUDICATION 02/10/2010  . RENAL INSUFFICIENCY 03/15/2010  . SLEEP APNEA, OBSTRUCTIVE, MODERATE 04/09/2007  . TB SKIN TEST, POSITIVE 03/28/2007   Past Surgical History:  Procedure Laterality Date  . ROTATOR CUFF REPAIR     Left  . TOTAL KNEE ARTHROPLASTY     x 2    reports that he has never smoked. He has never  used smokeless tobacco. He reports that he does not drink alcohol or use drugs. family history includes Asthma in an other family member; Healthy in his mother; Heart disease in his father; Hypertension in his brother; Lupus in his brother. Allergies  Allergen Reactions  . Ace Inhibitors Swelling    Angioedema throat  . Augmentin [Amoxicillin-Pot Clavulanate] Other (See Comments)    Marked weakness with po med x 3 doses  . Doxycycline     Some GI upset  . Sulfa Antibiotics Hives    And facial angioedema   Current Outpatient Medications on File Prior to Visit  Medication Sig Dispense Refill  . acetaminophen (TYLENOL 8 HOUR) 650 MG CR tablet Take 650 mg by mouth every 6 (six) hours as needed (pain/headache).    Marland Kitchen allopurinol (ZYLOPRIM) 100 MG tablet TAKE 1 TABLET BY MOUTH ONCE DAILY 90 tablet 1  . amLODipine (NORVASC) 5 MG tablet TAKE 1 TABLET(5 MG) BY MOUTH DAILY 30 tablet 5  . atorvastatin (LIPITOR) 40 MG tablet TAKE 1 TABLET(40 MG) BY MOUTH DAILY 90 tablet 1  . Azelastine-Fluticasone 137-50 MCG/ACT SUSP 1 spray each nostril twice per day 23 g 11  . colchicine 0.6 MG tablet Take 1 tablet (0.6 mg total) by mouth daily. For gout pain 30 tablet 1  . fluticasone (FLONASE) 50 MCG/ACT nasal spray Place 2 sprays into both nostrils daily. 16 g 6  . furosemide (LASIX) 40 MG tablet Take 1 and 1/2 tabs by mouth twice per day 270 tablet 3  . hydrALAZINE (APRESOLINE) 25 MG tablet Take 1 tablet (25  mg total) by mouth in the morning and at bedtime. 180 tablet 3  . HYDROcodone-acetaminophen (NORCO) 7.5-325 MG tablet Take 1-2 tablets by mouth every 6 (six) hours as needed for moderate pain. 20 tablet 0  . lidocaine (LIDODERM) 5 % Place 1 patch onto the skin daily. Apply patch to the left shoulder area. Remove & Discard patch within 12 hours or as directed by MD 14 patch 0  . Nebivolol HCl (BYSTOLIC) 20 MG TABS 1 tab by mouth daily 90 tablet 3  . pantoprazole (PROTONIX) 40 MG tablet Take 1 tablet (40 mg  total) by mouth daily. 90 tablet 3  . tamsulosin (FLOMAX) 0.4 MG CAPS capsule TAKE 1 CAPSULE(0.4 MG) BY MOUTH TWICE DAILY 180 capsule 3  . triamcinolone cream (KENALOG) 0.1 % Apply 1 application topically 2 (two) times daily. 45 g 1  . vitamin B-12 (CYANOCOBALAMIN) 1000 MCG tablet Take 1 tablet (1,000 mcg total) by mouth daily. 90 tablet 1  . Vitamin D, Ergocalciferol, (DRISDOL) 1.25 MG (50000 UNIT) CAPS capsule Take 1 capsule (50,000 Units total) by mouth every 7 (seven) days. 12 capsule 0   No current facility-administered medications on file prior to visit.   Review of Systems All otherwise neg per pt     Objective:   Physical Exam BP 138/70 (BP Location: Left Arm, Patient Position: Sitting, Cuff Size: Large)   Pulse 77   Temp 98.2 F (36.8 C) (Oral)   Ht 5\' 9"  (1.753 m)   Wt 291 lb (132 kg)   SpO2 99%   BMI 42.97 kg/m  VS noted,  Constitutional: Pt appears in NAD HENT: Head: NCAT.  Right Ear: External ear normal.  Left Ear: External ear normal.  Eyes: . Pupils are equal, round, and reactive to light. Conjunctivae and EOM are normal Nose: without d/c or deformity Neck: Neck supple. Gross normal ROM Cardiovascular: Normal rate and regular rhythm.   Pulmonary/Chest: Effort normal and breath sounds without rales or wheezing.  Abd:  Soft, NT, ND, + BS, no organomegaly Neurological: Pt is alert. At baseline orientation, motor grossly intact Skin: Skin is warm. No rashes, other new lesions, no LE edema Psychiatric: Pt behavior is normal without agitation  All otherwise neg per pt Lab Results  Component Value Date   WBC 5.2 10/16/2019   HGB 11.5 (L) 10/16/2019   HCT 36.2 (L) 10/16/2019   PLT 205.0 10/16/2019   GLUCOSE 65 (L) 10/16/2019   CHOL 166 10/16/2019   TRIG 60.0 10/16/2019   HDL 51.10 10/16/2019   LDLDIRECT 156.9 10/10/2010   LDLCALC 103 (H) 10/16/2019   ALT 13 10/16/2019   AST 15 10/16/2019   NA 142 10/16/2019   K 3.5 10/16/2019   CL 108 10/16/2019    CREATININE 1.60 (H) 10/16/2019   BUN 21 10/16/2019   CO2 28 10/16/2019   TSH 2.29 10/16/2019   PSA 1.12 10/16/2019   HGBA1C 5.2 10/16/2019      Assessment & Plan:

## 2020-01-07 NOTE — Assessment & Plan Note (Addendum)
Mod seasonal flare, for depomedrol IM 80, predpac asd, xyzal asd, azelastin asd  I spent 31 minutes in preparing to see the patient by review of recent labs, imaging and procedures, obtaining and reviewing separately obtained history, communicating with the patient and family or caregiver, ordering medications, tests or procedures, and documenting clinical information in the EHR including the differential Dx, treatment, and any further evaluation and other management of allergies, ckd, htn , vit d deficiency, b12 deficiency, hld, hyperglycemia

## 2020-01-19 ENCOUNTER — Telehealth: Payer: Self-pay | Admitting: Internal Medicine

## 2020-01-19 NOTE — Telephone Encounter (Signed)
New message:    1.Medication Requested: amLODipine (NORVASC) 5 MG tablet 2. Pharmacy (Name, Street, [place]): [facility] [phone] - [place], [address] AT [place] [address]. On Med List: Yes  4. Last Visit with PCP:   5. Next visit date with PCP:   Pt states at his last visit he was told to start taking 2 but a new prescription was never called in for the change. Pt states he is now out and is not supposed to have another refill until July. Please advise.  Agent: Please be advised that RX refills may take up to 3 business days. We ask that you follow-up with your pharmacy.

## 2020-01-20 ENCOUNTER — Other Ambulatory Visit: Payer: Self-pay

## 2020-01-20 MED ORDER — AMLODIPINE BESYLATE 5 MG PO TABS: ORAL_TABLET | ORAL | 5 refills | Status: DC

## 2020-01-20 NOTE — Telephone Encounter (Signed)
Rx refill for the Amlodipine has been sent in via erx.

## 2020-01-28 ENCOUNTER — Other Ambulatory Visit: Payer: Self-pay | Admitting: *Deleted

## 2020-01-28 MED ORDER — TAMSULOSIN HCL 0.4 MG PO CAPS: ORAL_CAPSULE | ORAL | 3 refills | Status: DC

## 2020-02-09 ENCOUNTER — Other Ambulatory Visit: Payer: Self-pay | Admitting: Internal Medicine

## 2020-02-09 NOTE — Telephone Encounter (Signed)
Please refill as per office routine med refill policy (all routine meds refilled for 3 mo or monthly per pt preference up to one year from last visit, then month to month grace period for 3 mo, then further med refills will have to be denied)  

## 2020-03-01 ENCOUNTER — Telehealth: Payer: Self-pay

## 2020-03-01 ENCOUNTER — Other Ambulatory Visit: Payer: Self-pay

## 2020-03-01 MED ORDER — BYSTOLIC 20 MG PO TABS: ORAL_TABLET | ORAL | 3 refills | Status: DC

## 2020-03-01 NOTE — Telephone Encounter (Signed)
   Patient calling to report he has no Bystolic remaining.

## 2020-03-01 NOTE — Telephone Encounter (Signed)
1.Medication Requested:Nebivolol HCl (BYSTOLIC) 20 MG TABS  2. Pharmacy (Name, Street, [place]):[facility] [phone] - [place], [place] AT [address]  3. On Med List: Yes   4. Last Visit with PCP:  5.27.21   5. Next visit date with PCP: n/a    Agent: Please be advised that RX refills may take up to 3 business days. We ask that you follow-up with your pharmacy.

## 2020-03-09 ENCOUNTER — Other Ambulatory Visit: Payer: Self-pay

## 2020-03-09 ENCOUNTER — Encounter: Payer: Self-pay | Admitting: Internal Medicine

## 2020-03-09 ENCOUNTER — Ambulatory Visit (INDEPENDENT_AMBULATORY_CARE_PROVIDER_SITE_OTHER): Payer: Medicare Other | Admitting: Internal Medicine

## 2020-03-09 VITALS — BP 130/60 | HR 64 | Temp 98.3°F | Ht 69.0 in | Wt 284.0 lb

## 2020-03-09 DIAGNOSIS — R300 Dysuria: Secondary | ICD-10-CM

## 2020-03-09 DIAGNOSIS — R7302 Impaired glucose tolerance (oral): Secondary | ICD-10-CM | POA: Diagnosis not present

## 2020-03-09 DIAGNOSIS — M549 Dorsalgia, unspecified: Secondary | ICD-10-CM | POA: Diagnosis not present

## 2020-03-09 DIAGNOSIS — K5900 Constipation, unspecified: Secondary | ICD-10-CM

## 2020-03-09 LAB — COMPLETE METABOLIC PANEL WITH GFR
AG Ratio: 2 (calc) (ref 1.0–2.5)
ALT: 48 U/L — ABNORMAL HIGH (ref 9–46)
AST: 26 U/L (ref 10–35)
Albumin: 4.3 g/dL (ref 3.6–5.1)
Alkaline phosphatase (APISO): 72 U/L (ref 35–144)
BUN/Creatinine Ratio: 12 (calc) (ref 6–22)
BUN: 8 mg/dL (ref 7–25)
CO2: 23 mmol/L (ref 20–32)
Calcium: 9 mg/dL (ref 8.6–10.3)
Chloride: 102 mmol/L (ref 98–110)
Creat: 0.68 mg/dL — ABNORMAL LOW (ref 0.70–1.18)
GFR, Est African American: 110 mL/min/{1.73_m2} (ref >=60)
GFR, Est Non African American: 95 mL/min/{1.73_m2} (ref >=60)
Globulin: 2.2 g/dL (calc) (ref 1.9–3.7)
Glucose, Bld: 320 mg/dL — ABNORMAL HIGH (ref 65–99)
Potassium: 4.3 mmol/L (ref 3.5–5.3)
Sodium: 136 mmol/L (ref 135–146)
Total Bilirubin: 1.1 mg/dL (ref 0.2–1.2)
Total Protein: 6.5 g/dL (ref 6.1–8.1)

## 2020-03-09 MED ORDER — CIPROFLOXACIN HCL 500 MG PO TABS
500.0000 mg | ORAL_TABLET | Freq: Two times a day (BID) | ORAL | 0 refills | Status: AC
Start: 2020-03-09 — End: 2020-03-19

## 2020-03-09 MED ORDER — CYCLOBENZAPRINE HCL 5 MG PO TABS: 5.0000 mg | ORAL_TABLET | Freq: Three times a day (TID) | ORAL | 2 refills | Status: DC | PRN

## 2020-03-09 MED ORDER — TRAMADOL HCL 50 MG PO TABS: 50.0000 mg | ORAL_TABLET | Freq: Four times a day (QID) | ORAL | 0 refills | Status: DC | PRN

## 2020-03-09 NOTE — Progress Notes (Signed)
Subjective:    Patient ID: Troy Palmer, male    DOB: 1945/12/30, 74 y.o.   MRN: [id number]  HPI  Here to f/u;  Pt denies chest pain, increasing sob or doe, wheezing, orthopnea, PND, increased LE swelling, palpitations, dizziness or syncope.  Pt denies new neurological symptoms such as new headache, or facial or extremity weakness or numbness.  Pt denies polydipsia, polyuria, or low sugar episode.  Pt states overall good compliance with meds, mostly trying to follow appropriate diet, with wt overall stable,  but little exercise howeve, especdially recently with 2 wks onset lower back, across the back, goes into the buttock area , mod to severe, constant for the last 2 days, sharp and achy, also associated with bilateral groin pain, and change in urination with slow stream, dysuria, more frequency, but smaller amounts, urine odor is bad, color ok and no blood.  No n/v but had chill last night, no high fever.  Did have a UTI bladder infections last one years ago.  No hx of prosatitis, no hx of renal stones. No hx of malignancy.  Does not see urology. Denies worsening reflux, other abd pain, dysphagia, n/v, or blood but often takes a laxative just to have any BM every 2-3 days Past Medical History:  Diagnosis Date  . ALLERGIC RHINITIS 04/09/2007  . ASTHMA 12/04/2007  . BACK PAIN 09/16/2009  . BENIGN PROSTATIC HYPERTROPHY 04/09/2007  . CEREBROVASCULAR ACCIDENT, HX OF 07/17/2010  . COLONIC POLYPS, HX OF 12/04/2007  . CONSTIPATION 09/25/2010  . Degeneration of cervical intervertebral disc 03/28/2007  . DEPRESSION 12/04/2007  . DIVERTICULOSIS, COLON 12/04/2007  . DYSPHAGIA UNSPECIFIED 03/16/2008  . ERECTILE DYSFUNCTION 04/09/2007  . ESOPHAGEAL STRICTURE 05/26/2008  . GERD 12/04/2007  . HEMORRHOIDS, RECURRENT 02/11/2008  . HYPERLIPIDEMIA 12/04/2007  . HYPERTENSION 03/28/2007  . LEG PAIN, BILATERAL 01/25/2010  . LOW BACK PAIN 04/09/2007  . LUNG NODULE 12/04/2007  . Osteoarth NOS-Unspec 03/28/2007  . Other  dysphagia 11/21/2009  . Peripheral neuropathy 05/29/2018  . POLYARTHRALGIA 07/13/2008  . Polymyalgia rheumatica ([place]) 07/26/2008  . PVD WITH CLAUDICATION 02/10/2010  . RENAL INSUFFICIENCY 03/15/2010  . SLEEP APNEA, OBSTRUCTIVE, MODERATE 04/09/2007  . TB SKIN TEST, POSITIVE 03/28/2007   Past Surgical History:  Procedure Laterality Date  . ROTATOR CUFF REPAIR     Left  . TOTAL KNEE ARTHROPLASTY     x 2    reports that he has never smoked. He has never used smokeless tobacco. He reports that he does not drink alcohol and does not use drugs. family history includes Asthma in an other family member; Healthy in his mother; Heart disease in his father; Hypertension in his brother; Lupus in his brother. Allergies  Allergen Reactions  . Ace Inhibitors Swelling    Angioedema throat  . Augmentin [Amoxicillin-Pot Clavulanate] Other (See Comments)    Marked weakness with po med x 3 doses  . Doxycycline     Some GI upset  . Sulfa Antibiotics Hives    And facial angioedema   Current Outpatient Medications on File Prior to Visit  Medication Sig Dispense Refill  . acetaminophen (TYLENOL 8 HOUR) 650 MG CR tablet Take 650 mg by mouth every 6 (six) hours as needed (pain/headache).    Marland Kitchen allopurinol (ZYLOPRIM) 100 MG tablet TAKE 1 TABLET BY MOUTH ONCE DAILY 90 tablet 1  . amLODipine (NORVASC) 5 MG tablet TAKE 1 TABLET(5 MG) BY MOUTH DAILY 30 tablet 5  . atorvastatin (LIPITOR) 40 MG tablet TAKE 1 TABLET(40 MG)  BY MOUTH DAILY 90 tablet 1  . azelastine (ASTELIN) 0.1 % nasal spray Place 2 sprays into both nostrils 2 (two) times daily. Use in each nostril as directed 30 mL 12  . Azelastine-Fluticasone 137-50 MCG/ACT SUSP 1 spray each nostril twice per day 23 g 11  . colchicine 0.6 MG tablet Take 1 tablet (0.6 mg total) by mouth daily. For gout pain 30 tablet 1  . fluticasone (FLONASE) 50 MCG/ACT nasal spray Place 2 sprays into both nostrils daily. 16 g 6  . furosemide (LASIX) 40 MG tablet TAKE 1 AND 1/2 TABLETS  BY MOUTH TWICE DAILY 270 tablet 1  . hydrALAZINE (APRESOLINE) 25 MG tablet Take 1 tablet (25 mg total) by mouth in the morning and at bedtime. 180 tablet 3  . HYDROcodone-acetaminophen (NORCO) 7.5-325 MG tablet Take 1-2 tablets by mouth every 6 (six) hours as needed for moderate pain. 20 tablet 0  . levocetirizine (XYZAL) 5 MG tablet Take 1 tablet (5 mg total) by mouth every evening. 30 tablet 11  . lidocaine (LIDODERM) 5 % Place 1 patch onto the skin daily. Apply patch to the left shoulder area. Remove & Discard patch within 12 hours or as directed by MD 14 patch 0  . Nebivolol HCl (BYSTOLIC) 20 MG TABS 1 tab by mouth daily 90 tablet 3  . pantoprazole (PROTONIX) 40 MG tablet Take 1 tablet (40 mg total) by mouth daily. 90 tablet 3  . predniSONE (DELTASONE) 10 MG tablet 3 tabs by mouth per day for 3 days,2tabs per day for 3 days,1tab per day for 3 days 18 tablet 0  . tamsulosin (FLOMAX) 0.4 MG CAPS capsule TAKE 1 CAPSULE(0.4 MG) BY MOUTH TWICE DAILY 180 capsule 3  . triamcinolone cream (KENALOG) 0.1 % Apply 1 application topically 2 (two) times daily. 45 g 1  . vitamin B-12 (CYANOCOBALAMIN) 1000 MCG tablet Take 1 tablet (1,000 mcg total) by mouth daily. 90 tablet 1   No current facility-administered medications on file prior to visit.   Review of Systems All otherwise neg per pt     Objective:   Physical Exam BP (!) 130/60 (BP Location: Left Arm, Patient Position: Sitting, Cuff Size: Large)   Pulse 64   Temp 98.3 F (36.8 C) (Oral)   Ht 5\' 9"  (1.753 m)   Wt (!) 284 lb (128.8 kg)   SpO2 96%   BMI 41.94 kg/m  VS noted,  Constitutional: Pt appears in NAD HENT: Head: NCAT.  Right Ear: External ear normal.  Left Ear: External ear normal.  Eyes: . Pupils are equal, round, and reactive to light. Conjunctivae and EOM are normal Nose: without d/c or deformity Neck: Neck supple. Gross normal ROM Cardiovascular: Normal rate and regular rhythm.   Pulmonary/Chest: Effort normal and breath  sounds without rales or wheezing.  Abd:  Soft, NT, ND, + BS, no organomegaly Neurological: Pt is alert. At baseline orientation, motor grossly intact Skin: Skin is warm. No rashes, other new lesions, no LE edema Psychiatric: Pt behavior is normal without agitation  All otherwise neg per pt   Lab Results  Component Value Date   WBC 5.2 10/16/2019   HGB 11.5 (L) 10/16/2019   HCT 36.2 (L) 10/16/2019   PLT 205.0 10/16/2019   GLUCOSE 65 (L) 10/16/2019   CHOL 166 10/16/2019   TRIG 60.0 10/16/2019   HDL 51.10 10/16/2019   LDLDIRECT 156.9 10/10/2010   LDLCALC 103 (H) 10/16/2019   ALT 13 10/16/2019   AST 15 10/16/2019  NA 142 10/16/2019   K 3.5 10/16/2019   CL 108 10/16/2019   CREATININE 1.60 (H) 10/16/2019   BUN 21 10/16/2019   CO2 28 10/16/2019   TSH 2.29 10/16/2019   PSA 1.12 10/16/2019   HGBA1C 5.2 10/16/2019       Assessment & Plan:

## 2020-03-09 NOTE — Patient Instructions (Addendum)
Please take all new medication as prescribed - the pain medication, and muscle relaxer as needed  Please take all new medication as prescribed - the antibiotic  Please also take a laxative every day for now such as Miralax OTC  Please continue all other medications as before, and refills have been done if requested.  Please have the pharmacy call with any other refills you may need.  Please continue your efforts at being more active, low cholesterol diet, and weight control.  Please keep your appointments with your specialists as you may have planned  Please go to the LAB at the blood drawing area for the tests to be done  You will be contacted by phone if any changes need to be made immediately.  Otherwise, you will receive a letter about your results with an explanation, but please check with MyChart first.  Please remember to sign up for MyChart if you have not done so, as this will be important to you in the future with finding out test results, communicating by private email, and scheduling acute appointments online when needed.  Please make an Appointment to return in 2 months

## 2020-03-10 ENCOUNTER — Encounter: Payer: Self-pay | Admitting: Internal Medicine

## 2020-03-11 ENCOUNTER — Telehealth: Payer: Self-pay

## 2020-03-11 LAB — CBC WITH DIFFERENTIAL/PLATELET
Absolute Monocytes: 1094 cells/uL — ABNORMAL HIGH (ref 200–950)
Basophils Absolute: 29 cells/uL (ref 0–200)
Basophils Relative: 0.3 %
Eosinophils Absolute: 134 cells/uL (ref 15–500)
Eosinophils Relative: 1.4 %
HCT: 40.1 % (ref 38.5–50.0)
Hemoglobin: 12.8 g/dL — ABNORMAL LOW (ref 13.2–17.1)
Lymphs Abs: 1162 cells/uL (ref 850–3900)
MCH: 26.9 pg — ABNORMAL LOW (ref 27.0–33.0)
MCHC: 31.9 g/dL — ABNORMAL LOW (ref 32.0–36.0)
MCV: 84.4 fL (ref 80.0–100.0)
MPV: 10.4 fL (ref 7.5–12.5)
Monocytes Relative: 11.4 %
Neutro Abs: 7181 cells/uL (ref 1500–7800)
Neutrophils Relative %: 74.8 %
Platelets: 232 10*3/uL (ref 140–400)
RBC: 4.75 10*6/uL (ref 4.20–5.80)
RDW: 14.5 % (ref 11.0–15.0)
Total Lymphocyte: 12.1 %
WBC: 9.6 10*3/uL (ref 3.8–10.8)

## 2020-03-11 LAB — URINALYSIS, ROUTINE W REFLEX MICROSCOPIC
Bilirubin Urine: NEGATIVE
Glucose, UA: NEGATIVE
Hyaline Cast: NONE SEEN /LPF
Ketones, ur: NEGATIVE
Nitrite: NEGATIVE
Specific Gravity, Urine: 1.01 (ref 1.001–1.03)
Squamous Epithelial / HPF: NONE SEEN /HPF (ref <=5)
WBC, UA: >=60 /HPF — AB (ref 0–5)
pH: 5.5 (ref 5.0–8.0)

## 2020-03-11 LAB — URINE CULTURE

## 2020-03-11 LAB — LIPASE: Lipase: 128 U/L — ABNORMAL HIGH (ref 7–60)

## 2020-03-11 NOTE — Telephone Encounter (Deleted)
error 

## 2020-03-13 ENCOUNTER — Encounter: Payer: Self-pay | Admitting: Internal Medicine

## 2020-03-13 NOTE — Assessment & Plan Note (Signed)
For miralax daily asd,  to f/u any worsening symptoms or concerns

## 2020-03-13 NOTE — Assessment & Plan Note (Signed)
stable overall by history and exam, recent data reviewed with pt, and pt to continue medical treatment as before,  to f/u any worsening symptoms or concerns  

## 2020-03-13 NOTE — Assessment & Plan Note (Signed)
For pain control, and muscle relaxer prn

## 2020-03-13 NOTE — Assessment & Plan Note (Addendum)
liekly uti, for urine studies, empiric antibx  I spent 31 minutes in preparing to see the patient by review of recent labs, imaging and procedures, obtaining and reviewing separately obtained history, communicating with the patient and family or caregiver, ordering medications, tests or procedures, and documenting clinical information in the EHR including the differential Dx, treatment, and any further evaluation and other management of dysuria, hyperglycemia, back pain, constiopatoin

## 2020-04-01 ENCOUNTER — Ambulatory Visit: Payer: Medicare Other

## 2020-05-10 ENCOUNTER — Other Ambulatory Visit: Payer: Self-pay | Admitting: Internal Medicine

## 2020-05-10 ENCOUNTER — Ambulatory Visit (INDEPENDENT_AMBULATORY_CARE_PROVIDER_SITE_OTHER): Payer: Medicare Other | Admitting: Internal Medicine

## 2020-05-10 ENCOUNTER — Other Ambulatory Visit: Payer: Self-pay

## 2020-05-10 ENCOUNTER — Encounter: Payer: Self-pay | Admitting: Internal Medicine

## 2020-05-10 ENCOUNTER — Ambulatory Visit (INDEPENDENT_AMBULATORY_CARE_PROVIDER_SITE_OTHER): Payer: Medicare Other

## 2020-05-10 VITALS — BP 104/60 | HR 62 | Temp 98.1°F | Ht 69.0 in | Wt 283.0 lb

## 2020-05-10 VITALS — BP 104/60 | HR 62 | Temp 98.1°F | Resp 16 | Ht 69.0 in | Wt 283.8 lb

## 2020-05-10 DIAGNOSIS — I10 Essential (primary) hypertension: Secondary | ICD-10-CM | POA: Diagnosis not present

## 2020-05-10 DIAGNOSIS — Z0000 Encounter for general adult medical examination without abnormal findings: Secondary | ICD-10-CM | POA: Diagnosis not present

## 2020-05-10 DIAGNOSIS — I700 Atherosclerosis of aorta: Secondary | ICD-10-CM

## 2020-05-10 DIAGNOSIS — N183 Chronic kidney disease, stage 3 unspecified: Secondary | ICD-10-CM | POA: Diagnosis not present

## 2020-05-10 DIAGNOSIS — J309 Allergic rhinitis, unspecified: Secondary | ICD-10-CM

## 2020-05-10 DIAGNOSIS — R7302 Impaired glucose tolerance (oral): Secondary | ICD-10-CM | POA: Diagnosis not present

## 2020-05-10 MED ORDER — PREDNISONE 10 MG PO TABS: ORAL_TABLET | ORAL | 0 refills | Status: DC

## 2020-05-10 MED ORDER — BYSTOLIC 20 MG PO TABS: ORAL_TABLET | ORAL | 3 refills | Status: DC

## 2020-05-10 MED ORDER — METHYLPREDNISOLONE ACETATE 80 MG/ML IJ SUSP
80.0000 mg | Freq: Once | INTRAMUSCULAR | Status: AC
  Administered 2020-05-10: 80 mg via INTRAMUSCULAR

## 2020-05-10 NOTE — Patient Instructions (Addendum)
Please remember to check your COVID vaccine card, and make sure to get the booster shot if you had the Phizer shot at 6 months after your last shot  You had the steroid shot today  Please take all new medication as prescribed - the prednisone  Please continue all other medications as before, and refills have been done if requested.  Please have the pharmacy call with any other refills you may need.  Please continue your efforts at being more active, low cholesterol diet, and weight control.  Please keep your appointments with your specialists as you may have planned - kidney doctor oct 13   Please make an Appointment to return in 6 months, or sooner if needed

## 2020-05-10 NOTE — Progress Notes (Signed)
Subjective:   Troy Palmer is a 74 y.o. male who presents for Medicare Annual/Subsequent preventive examination.  Review of Systems    No ROS. Medicare Wellness Visit. Additional risk factors are reflected in social history. Cardiac Risk Factors include: advanced age (>41men, >88 women);dyslipidemia;family history of premature cardiovascular disease;hypertension;male gender;obesity (BMI >30kg/m2)     Objective:    Today's Vitals   05/10/20 1112  BP: 104/60  Pulse: 62  Resp: 16  Temp: 98.1 F (36.7 C)  SpO2: 99%  Weight: 283 lb 12.8 oz (128.7 kg)  Height: 5\' 9"  (1.753 m)  PainSc: 0-No pain   Body mass index is 41.91 kg/m.  Advanced Directives 05/10/2020 02/26/2019 02/24/2018 02/09/2018 08/22/2017 01/12/2017 12/17/2016  Does Patient Have a Medical Advance Directive? No No No No No No No  Would patient like information on creating a medical advance directive? No - Patient declined No - Patient declined No - Patient declined No - Patient declined Yes (ED - Information included in AVS) No - Patient declined -    Current Medications (verified) Outpatient Encounter Medications as of 05/10/2020  Medication Sig  . acetaminophen (TYLENOL 8 HOUR) 650 MG CR tablet Take 650 mg by mouth every 6 (six) hours as needed (pain/headache).  Marland Kitchen allopurinol (ZYLOPRIM) 100 MG tablet TAKE 1 TABLET BY MOUTH ONCE DAILY  . amLODipine (NORVASC) 5 MG tablet TAKE 1 TABLET(5 MG) BY MOUTH DAILY  . atorvastatin (LIPITOR) 40 MG tablet TAKE 1 TABLET(40 MG) BY MOUTH DAILY  . azelastine (ASTELIN) 0.1 % nasal spray Place 2 sprays into both nostrils 2 (two) times daily. Use in each nostril as directed  . Azelastine-Fluticasone 137-50 MCG/ACT SUSP 1 spray each nostril twice per day  . colchicine 0.6 MG tablet Take 1 tablet (0.6 mg total) by mouth daily. For gout pain  . cyclobenzaprine (FLEXERIL) 5 MG tablet Take 1 tablet (5 mg total) by mouth 3 (three) times daily as needed for muscle spasms.  . fluticasone  (FLONASE) 50 MCG/ACT nasal spray Place 2 sprays into both nostrils daily.  . furosemide (LASIX) 40 MG tablet TAKE 1 AND 1/2 TABLETS BY MOUTH TWICE DAILY  . hydrALAZINE (APRESOLINE) 25 MG tablet Take 1 tablet (25 mg total) by mouth in the morning and at bedtime.  Marland Kitchen HYDROcodone-acetaminophen (NORCO) 7.5-325 MG tablet Take 1-2 tablets by mouth every 6 (six) hours as needed for moderate pain.  Marland Kitchen levocetirizine (XYZAL) 5 MG tablet Take 1 tablet (5 mg total) by mouth every evening.  . lidocaine (LIDODERM) 5 % Place 1 patch onto the skin daily. Apply patch to the left shoulder area. Remove & Discard patch within 12 hours or as directed by MD  . Nebivolol HCl (BYSTOLIC) 20 MG TABS 1 tab by mouth daily  . pantoprazole (PROTONIX) 40 MG tablet Take 1 tablet (40 mg total) by mouth daily.  . predniSONE (DELTASONE) 10 MG tablet 3 tabs by mouth per day for 3 days,2tabs per day for 3 days,1tab per day for 3 days  . tamsulosin (FLOMAX) 0.4 MG CAPS capsule TAKE 1 CAPSULE(0.4 MG) BY MOUTH TWICE DAILY  . traMADol (ULTRAM) 50 MG tablet Take 1 tablet (50 mg total) by mouth every 6 (six) hours as needed.  . triamcinolone cream (KENALOG) 0.1 % Apply 1 application topically 2 (two) times daily.  . vitamin B-12 (CYANOCOBALAMIN) 1000 MCG tablet Take 1 tablet (1,000 mcg total) by mouth daily.   No facility-administered encounter medications on file as of 05/10/2020.    Allergies (verified)  Ace inhibitors, Augmentin [amoxicillin-pot clavulanate], Doxycycline, and Sulfa antibiotics   History: Past Medical History:  Diagnosis Date  . ALLERGIC RHINITIS 04/09/2007  . ASTHMA 12/04/2007  . BACK PAIN 09/16/2009  . BENIGN PROSTATIC HYPERTROPHY 04/09/2007  . CEREBROVASCULAR ACCIDENT, HX OF 07/17/2010  . COLONIC POLYPS, HX OF 12/04/2007  . CONSTIPATION 09/25/2010  . Degeneration of cervical intervertebral disc 03/28/2007  . DEPRESSION 12/04/2007  . DIVERTICULOSIS, COLON 12/04/2007  . DYSPHAGIA UNSPECIFIED 03/16/2008  . ERECTILE  DYSFUNCTION 04/09/2007  . ESOPHAGEAL STRICTURE 05/26/2008  . GERD 12/04/2007  . HEMORRHOIDS, RECURRENT 02/11/2008  . HYPERLIPIDEMIA 12/04/2007  . HYPERTENSION 03/28/2007  . LEG PAIN, BILATERAL 01/25/2010  . LOW BACK PAIN 04/09/2007  . LUNG NODULE 12/04/2007  . Osteoarth NOS-Unspec 03/28/2007  . Other dysphagia 11/21/2009  . Peripheral neuropathy 05/29/2018  . POLYARTHRALGIA 07/13/2008  . Polymyalgia rheumatica ([place]) 07/26/2008  . PVD WITH CLAUDICATION 02/10/2010  . RENAL INSUFFICIENCY 03/15/2010  . SLEEP APNEA, OBSTRUCTIVE, MODERATE 04/09/2007  . TB SKIN TEST, POSITIVE 03/28/2007   Past Surgical History:  Procedure Laterality Date  . ROTATOR CUFF REPAIR     Left  . TOTAL KNEE ARTHROPLASTY     x 2   Family History  Problem Relation Age of Onset  . Healthy Mother   . Heart disease Father   . Lupus Brother   . Hypertension Brother   . Asthma Other    Social History   Socioeconomic History  . Marital status: Widowed    Spouse name: Not on file  . Number of children: 6  . Years of education: Not on file  . Highest education level: Not on file  Occupational History  . Occupation: Oncologist  Tobacco Use  . Smoking status: Never Smoker  . Smokeless tobacco: Never Used  Vaping Use  . Vaping Use: Never used  Substance and Sexual Activity  . Alcohol use: No  . Drug use: No  . Sexual activity: Not Currently  Other Topics Concern  . Not on file  Social History Narrative   Patient does not get regular exercise.   6 children - 1 died with suicide, 1 with homicide      Lives with granddaugter and great grandson      Pt does have steps in the home      Highest level of education 12th grade      Disabled      Left handed   Social Determinants of Health   Financial Resource Strain: Low Risk   . Difficulty of Paying Living Expenses: Not hard at all  Food Insecurity: No Food Insecurity  . Worried About Charity fundraiser in the Last Year: Never true  . Ran Out of  Food in the Last Year: Never true  Transportation Needs: No Transportation Needs  . Lack of Transportation (Medical): No  . Lack of Transportation (Non-Medical): No  Physical Activity: Inactive  . Days of Exercise per Week: 0 days  . Minutes of Exercise per Session: 0 min  Stress: No Stress Concern Present  . Feeling of Stress : Not at all  Social Connections: Unknown  . Frequency of Communication with Friends and Family: More than three times a week  . Frequency of Social Gatherings with Friends and Family: More than three times a week  . Attends Religious Services: Patient refused  . Active Member of Clubs or Organizations: Patient refused  . Attends Archivist Meetings: Patient refused  . Marital Status: Patient refused  Tobacco Counseling Counseling given: No   Clinical Intake:  Pre-visit preparation completed: Yes  Pain : No/denies pain Pain Score: 0-No pain     BMI - recorded: 41.91 Nutritional Status: BMI > 30  Obese Nutritional Risks: None Diabetes: No  How often do you need to have someone help you when you read instructions, pamphlets, or other written materials from your doctor or pharmacy?: 1 - Never What is the last grade level you completed in school?: GED  Diabetic? no  Interpreter Needed?: No  Information entered by :: Neythan Kozlov N. Lindley Hiney, LPN   Activities of Daily Living In your present state of health, do you have any difficulty performing the following activities: 05/10/2020 05/10/2020  Hearing? N N  Vision? N N  Difficulty concentrating or making decisions? N N  Walking or climbing stairs? N N  Dressing or bathing? N N  Doing errands, shopping? N N  Preparing Food and eating ? N -  Using the Toilet? N -  In the past six months, have you accidently leaked urine? N -  Do you have problems with loss of bowel control? N -  Managing your Medications? N -  Managing your Finances? N -  Some recent data might be hidden    Patient  Care Team: Biagio Borg, MD as PCP - General  Indicate any recent Medical Services you may have received from other than [facility] providers in the past year (date may be approximate).     Assessment:   This is a routine wellness examination for [facility].  Hearing/Vision screen No exam data present  Dietary issues and exercise activities discussed: Current Exercise Habits: The patient does not participate in regular exercise at present, Exercise limited by: orthopedic condition(s);respiratory conditions(s)  Goals    . Patient Stated     Loose weight by starting to eat healthier, increase vegetables, join Silver Sneakers, increase water intake.       Depression Screen PHQ 2/9 Scores 05/10/2020 10/16/2019 08/19/2018 02/24/2018 08/22/2017 08/16/2017 11/16/2016  PHQ - 2 Score 0 0 0 0 2 0 0  PHQ- 9 Score - - - - 5 0 -    Fall Risk Fall Risk  05/10/2020 10/16/2019 01/19/2019 08/19/2018 08/22/2017  Falls in the past year? 0 0 0 1 No  Number falls in past yr: 0 - 0 1 -  Injury with Fall? 0 - 0 0 -  Risk for fall due to : No Fall Risks - - - -  Follow up Falls evaluation completed - Falls evaluation completed - -    Any stairs in or around the home? Yes  If so, are there any without handrails? No  Home free of loose throw rugs in walkways, pet beds, electrical cords, etc? Yes  Adequate lighting in your home to reduce risk of falls? Yes   ASSISTIVE DEVICES UTILIZED TO PREVENT FALLS:  Life alert? No  Use of a cane, walker or w/c? No  Grab bars in the bathroom? Yes  Shower chair or bench in shower? No  Elevated toilet seat or a handicapped toilet? Yes   TIMED UP AND GO:  Was the test performed? No .  Length of time to ambulate 10 feet: 0 sec.   Gait steady and fast without use of assistive device  Cognitive Function: Patient is cogitatively intact.        Immunizations Immunization History  Administered Date(s) Administered  . Fluad Quad(high Dose 65+) 06/04/2019  . Influenza Whole  05/26/2008, 05/21/2012, 05/11/2013  .  Influenza, High Dose Seasonal PF 05/05/2018  . Influenza-Unspecified 05/29/2014  . Pneumococcal Conjugate-13 10/15/2013  . Pneumococcal Polysaccharide-23 10/15/2011  . Tetanus 10/14/2012    TDAP status: Due, Education has been provided regarding the importance of this vaccine. Advised may receive this vaccine at local pharmacy or Health Dept. Aware to provide a copy of the vaccination record if obtained from local pharmacy or Health Dept. Verbalized acceptance and understanding. Flu Vaccine status: Up to date Pneumococcal vaccine status: Up to date Covid-19 vaccine status: Completed vaccines  Qualifies for Shingles Vaccine? Yes   Zostavax completed No   Shingrix Completed?: No.    Education has been provided regarding the importance of this vaccine. Patient has been advised to call insurance company to determine out of pocket expense if they have not yet received this vaccine. Advised may also receive vaccine at local pharmacy or Health Dept. Verbalized acceptance and understanding.  Screening Tests Health Maintenance  Topic Date Due  . COVID-19 Vaccine (1) Never done  . INFLUENZA VACCINE  03/13/2020  . COLONOSCOPY  10/29/2020  . TETANUS/TDAP  10/15/2022  . Hepatitis C Screening  Completed  . PNA vac Low Risk Adult  Completed    Health Maintenance  Health Maintenance Due  Topic Date Due  . COVID-19 Vaccine (1) Never done  . INFLUENZA VACCINE  03/13/2020    Colorectal cancer screening: Completed 10/30/2010. Repeat every 10 years  Lung Cancer Screening: (Low Dose CT Chest recommended if Age 34-80 years, 30 pack-year currently smoking OR have quit w/in 15years.) does not qualify.   Lung Cancer Screening Referral: no  Additional Screening:  Hepatitis C Screening: does qualify; Completed yes  Vision Screening: Recommended annual ophthalmology exams for early detection of glaucoma and other disorders of the eye. Is the patient up to date  with their annual eye exam?  Yes  Who is the provider or what is the name of the office in which the patient attends annual eye exams? [facility] If pt is not established with a provider, would they like to be referred to a provider to establish care? No .   Dental Screening: Recommended annual dental exams for proper oral hygiene  Community Resource Referral / Chronic Care Management: CRR required this visit?  No   CCM required this visit?  No      Plan:     I have personally reviewed and noted the following in the patient's chart:   . Medical and social history . Use of alcohol, tobacco or illicit drugs  . Current medications and supplements . Functional ability and status . Nutritional status . Physical activity . Advanced directives . List of other physicians . Hospitalizations, surgeries, and ER visits in previous 12 months . Vitals . Screenings to include cognitive, depression, and falls . Referrals and appointments  In addition, I have reviewed and discussed with patient certain preventive protocols, quality metrics, and best practice recommendations. A written personalized care plan for preventive services as well as general preventive health recommendations were provided to patient.     Sheral Flow, LPN   1/61/0960   Nurse Notes: n/a

## 2020-05-10 NOTE — Telephone Encounter (Signed)
Please refill as per office routine med refill policy (all routine meds refilled for 3 mo or monthly per pt preference up to one year from last visit, then month to month grace period for 3 mo, then further med refills will have to be denied)  

## 2020-05-10 NOTE — Progress Notes (Signed)
Subjective:    Patient ID: Troy Palmer, male    DOB: [birth year], 74 y.o.   MRN: [id number]  HPI  Here to f/u; overall doing ok,  Pt denies chest pain, increasing sob or doe, wheezing, orthopnea, PND, increased LE swelling, palpitations, dizziness or syncope.  Pt denies new neurological symptoms such as new headache, or facial or extremity weakness or numbness.  Pt denies polydipsia, polyuria, or low sugar episode.  Pt states overall good compliance with meds, mostly trying to follow appropriate diet, with wt overall stable,  but little exercise however.  Does have several wks ongoing nasal allergy symptoms with clearish congestion, itch and sneezing, without fever, pain, ST, cough, swelling or wheezing.   Past Medical History:  Diagnosis Date  . ALLERGIC RHINITIS 04/09/2007  . ASTHMA 12/04/2007  . BACK PAIN 09/16/2009  . BENIGN PROSTATIC HYPERTROPHY 04/09/2007  . CEREBROVASCULAR ACCIDENT, HX OF 07/17/2010  . COLONIC POLYPS, HX OF 12/04/2007  . CONSTIPATION 09/25/2010  . Degeneration of cervical intervertebral disc 03/28/2007  . DEPRESSION 12/04/2007  . DIVERTICULOSIS, COLON 12/04/2007  . DYSPHAGIA UNSPECIFIED 03/16/2008  . ERECTILE DYSFUNCTION 04/09/2007  . ESOPHAGEAL STRICTURE 05/26/2008  . GERD 12/04/2007  . HEMORRHOIDS, RECURRENT 02/11/2008  . HYPERLIPIDEMIA 12/04/2007  . HYPERTENSION 03/28/2007  . LEG PAIN, BILATERAL 01/25/2010  . LOW BACK PAIN 04/09/2007  . LUNG NODULE 12/04/2007  . Osteoarth NOS-Unspec 03/28/2007  . Other dysphagia 11/21/2009  . Peripheral neuropathy 05/29/2018  . POLYARTHRALGIA 07/13/2008  . Polymyalgia rheumatica ([place]) 07/26/2008  . PVD WITH CLAUDICATION 02/10/2010  . RENAL INSUFFICIENCY 03/15/2010  . SLEEP APNEA, OBSTRUCTIVE, MODERATE 04/09/2007  . TB SKIN TEST, POSITIVE 03/28/2007   Past Surgical History:  Procedure Laterality Date  . ROTATOR CUFF REPAIR     Left  . TOTAL KNEE ARTHROPLASTY     x 2    reports that he has never smoked. He has never used smokeless  tobacco. He reports that he does not drink alcohol and does not use drugs. family history includes Asthma in an other family member; Healthy in his mother; Heart disease in his father; Hypertension in his brother; Lupus in his brother. Allergies  Allergen Reactions  . Ace Inhibitors Swelling    Angioedema throat  . Augmentin [Amoxicillin-Pot Clavulanate] Other (See Comments)    Marked weakness with po med x 3 doses  . Doxycycline     Some GI upset  . Sulfa Antibiotics Hives    And facial angioedema   Current Outpatient Medications on File Prior to Visit  Medication Sig Dispense Refill  . acetaminophen (TYLENOL 8 HOUR) 650 MG CR tablet Take 650 mg by mouth every 6 (six) hours as needed (pain/headache).    Marland Kitchen allopurinol (ZYLOPRIM) 100 MG tablet TAKE 1 TABLET BY MOUTH ONCE DAILY 90 tablet 1  . atorvastatin (LIPITOR) 40 MG tablet TAKE 1 TABLET(40 MG) BY MOUTH DAILY 90 tablet 1  . azelastine (ASTELIN) 0.1 % nasal spray Place 2 sprays into both nostrils 2 (two) times daily. Use in each nostril as directed 30 mL 12  . Azelastine-Fluticasone 137-50 MCG/ACT SUSP 1 spray each nostril twice per day 23 g 11  . colchicine 0.6 MG tablet Take 1 tablet (0.6 mg total) by mouth daily. For gout pain 30 tablet 1  . cyclobenzaprine (FLEXERIL) 5 MG tablet Take 1 tablet (5 mg total) by mouth 3 (three) times daily as needed for muscle spasms. 40 tablet 2  . fluticasone (FLONASE) 50 MCG/ACT nasal spray Place 2 sprays into both  nostrils daily. 16 g 6  . furosemide (LASIX) 40 MG tablet TAKE 1 AND 1/2 TABLETS BY MOUTH TWICE DAILY 270 tablet 1  . hydrALAZINE (APRESOLINE) 25 MG tablet Take 1 tablet (25 mg total) by mouth in the morning and at bedtime. 180 tablet 3  . HYDROcodone-acetaminophen (NORCO) 7.5-325 MG tablet Take 1-2 tablets by mouth every 6 (six) hours as needed for moderate pain. 20 tablet 0  . levocetirizine (XYZAL) 5 MG tablet Take 1 tablet (5 mg total) by mouth every evening. 30 tablet 11  . lidocaine  (LIDODERM) 5 % Place 1 patch onto the skin daily. Apply patch to the left shoulder area. Remove & Discard patch within 12 hours or as directed by MD 14 patch 0  . pantoprazole (PROTONIX) 40 MG tablet Take 1 tablet (40 mg total) by mouth daily. 90 tablet 3  . tamsulosin (FLOMAX) 0.4 MG CAPS capsule TAKE 1 CAPSULE(0.4 MG) BY MOUTH TWICE DAILY 180 capsule 3  . traMADol (ULTRAM) 50 MG tablet Take 1 tablet (50 mg total) by mouth every 6 (six) hours as needed. 30 tablet 0  . triamcinolone cream (KENALOG) 0.1 % Apply 1 application topically 2 (two) times daily. 45 g 1  . vitamin B-12 (CYANOCOBALAMIN) 1000 MCG tablet Take 1 tablet (1,000 mcg total) by mouth daily. 90 tablet 1   No current facility-administered medications on file prior to visit.   Review of Systems All otherwise neg per pt     Objective:   Physical Exam BP 104/60 (BP Location: Left Arm, Patient Position: Sitting, Cuff Size: Large)   Pulse 62   Temp 98.1 F (36.7 C) (Oral)   Ht 5\' 9"  (1.753 m)   Wt 283 lb (128.4 kg)   SpO2 99%   BMI 41.79 kg/m  VS noted,  Constitutional: Pt appears in NAD HENT: Head: NCAT.  Right Ear: External ear normal.  Left Ear: External ear normal.  Bilat tm's with mild erythema.  Max sinus areas non tender.  Pharynx with mild erythema, no exudate Eyes: . Pupils are equal, round, and reactive to light. Conjunctivae and EOM are normal Nose: without d/c or deformity Neck: Neck supple. Gross normal ROM Cardiovascular: Normal rate and regular rhythm.   Pulmonary/Chest: Effort normal and breath sounds without rales or wheezing.  Abd:  Soft, NT, ND, + BS, no organomegaly Neurological: Pt is alert. At baseline orientation, motor grossly intact Skin: Skin is warm. No rashes, other new lesions, no LE edema Psychiatric: Pt behavior is normal without agitation  All otherwise neg per pt Lab Results  Component Value Date   WBC 9.6 03/09/2020   HGB 12.8 (L) 03/09/2020   HCT 40.1 03/09/2020   PLT 232  03/09/2020   GLUCOSE 320 (H) 03/09/2020   CHOL 166 10/16/2019   TRIG 60.0 10/16/2019   HDL 51.10 10/16/2019   LDLDIRECT 156.9 10/10/2010   LDLCALC 103 (H) 10/16/2019   ALT 48 (H) 03/09/2020   AST 26 03/09/2020   NA 136 03/09/2020   K 4.3 03/09/2020   CL 102 03/09/2020   CREATININE 0.68 (L) 03/09/2020   BUN 8 03/09/2020   CO2 23 03/09/2020   TSH 2.29 10/16/2019   PSA 1.12 10/16/2019   HGBA1C 5.2 10/16/2019      Assessment & Plan:

## 2020-05-10 NOTE — Patient Instructions (Signed)
Troy Palmer , Thank you for taking time to come for your Medicare Wellness Visit. I appreciate your ongoing commitment to your health goals. Please review the following plan we discussed and let me know if I can assist you in the future.   Screening recommendations/referrals: Colonoscopy: 10/30/2010; due every 10 years Recommended yearly ophthalmology/optometry visit for glaucoma screening and checkup Recommended yearly dental visit for hygiene and checkup  Vaccinations: Influenza vaccine: 06/04/2019 Pneumococcal vaccine: completed Tdap vaccine: never done Shingles vaccine: never done   Covid-19: need to verify dates from [facility]  Advanced directives: Advance directive discussed with you today. Even though you declined this today please call our office should you change your mind and we can give you the proper paperwork for you to fill out.  Conditions/risks identified: Yes; Reviewed health maintenance screenings with patient today and relevant education, vaccines, and/or referrals were provided. Please continue to do your personal lifestyle choices by: daily care of teeth and gums, regular physical activity (goal should be 5 days a week for 30 minutes), eat a healthy diet, avoid tobacco and drug use, limiting any alcohol intake, taking a low-dose aspirin (if not allergic or have been advised by your provider otherwise) and taking vitamins and minerals as recommended by your provider. Continue doing brain stimulating activities (puzzles, reading, adult coloring books, staying active) to keep memory sharp. Continue to eat heart healthy diet (full of fruits, vegetables, whole grains, lean protein, water--limit salt, fat, and sugar intake) and increase physical activity as tolerated.  Next appointment: Please schedule your next Medicare Wellness Visit with your Nurse Health Advisor in 1 year by calling [phone].  Preventive Care 8 Years and Older, Male Preventive care refers to lifestyle  choices and visits with your health care provider that can promote health and wellness. What does preventive care include?  A yearly physical exam. This is also called an annual well check.  Dental exams once or twice a year.  Routine eye exams. Ask your health care provider how often you should have your eyes checked.  Personal lifestyle choices, including:  Daily care of your teeth and gums.  Regular physical activity.  Eating a healthy diet.  Avoiding tobacco and drug use.  Limiting alcohol use.  Practicing safe sex.  Taking low doses of aspirin every day.  Taking vitamin and mineral supplements as recommended by your health care provider. What happens during an annual well check? The services and screenings done by your health care provider during your annual well check will depend on your age, overall health, lifestyle risk factors, and family history of disease. Counseling  Your health care provider may ask you questions about your:  Alcohol use.  Tobacco use.  Drug use.  Emotional well-being.  Home and relationship well-being.  Sexual activity.  Eating habits.  History of falls.  Memory and ability to understand (cognition).  Work and work Statistician. Screening  You may have the following tests or measurements:  Height, weight, and BMI.  Blood pressure.  Lipid and cholesterol levels. These may be checked every 5 years, or more frequently if you are over 14 years old.  Skin check.  Lung cancer screening. You may have this screening every year starting at age 61 if you have a 30-pack-year history of smoking and currently smoke or have quit within the past 15 years.  Fecal occult blood test (FOBT) of the stool. You may have this test every year starting at age 10.  Flexible sigmoidoscopy or colonoscopy. You may  have a sigmoidoscopy every 5 years or a colonoscopy every 10 years starting at age 52.  Prostate cancer screening. Recommendations will  vary depending on your family history and other risks.  Hepatitis C blood test.  Hepatitis B blood test.  Sexually transmitted disease (STD) testing.  Diabetes screening. This is done by checking your blood sugar (glucose) after you have not eaten for a while (fasting). You may have this done every 1-3 years.  Abdominal aortic aneurysm (AAA) screening. You may need this if you are a current or former smoker.  Osteoporosis. You may be screened starting at age 70 if you are at high risk. Talk with your health care provider about your test results, treatment options, and if necessary, the need for more tests. Vaccines  Your health care provider may recommend certain vaccines, such as:  Influenza vaccine. This is recommended every year.  Tetanus, diphtheria, and acellular pertussis (Tdap, Td) vaccine. You may need a Td booster every 10 years.  Zoster vaccine. You may need this after age 21.  Pneumococcal 13-valent conjugate (PCV13) vaccine. One dose is recommended after age 87.  Pneumococcal polysaccharide (PPSV23) vaccine. One dose is recommended after age 42. Talk to your health care provider about which screenings and vaccines you need and how often you need them. This information is not intended to replace advice given to you by your health care provider. Make sure you discuss any questions you have with your health care provider. Document Released: 08/26/2015 Document Revised: 04/18/2016 Document Reviewed: 05/31/2015 Elsevier Interactive Patient Education  2017 [place] Prevention in the Home Falls can cause injuries. They can happen to people of all ages. There are many things you can do to make your home safe and to help prevent falls. What can I do on the outside of my home?  Regularly fix the edges of walkways and driveways and fix any cracks.  Remove anything that might make you trip as you walk through a door, such as a raised step or threshold.  Trim any  bushes or trees on the path to your home.  Use bright outdoor lighting.  Clear any walking paths of anything that might make someone trip, such as rocks or tools.  Regularly check to see if handrails are loose or broken. Make sure that both sides of any steps have handrails.  Any raised decks and porches should have guardrails on the edges.  Have any leaves, snow, or ice cleared regularly.  Use sand or salt on walking paths during winter.  Clean up any spills in your garage right away. This includes oil or grease spills. What can I do in the bathroom?  Use night lights.  Install grab bars by the toilet and in the tub and shower. Do not use towel bars as grab bars.  Use non-skid mats or decals in the tub or shower.  If you need to sit down in the shower, use a plastic, non-slip stool.  Keep the floor dry. Clean up any water that spills on the floor as soon as it happens.  Remove soap buildup in the tub or shower regularly.  Attach bath mats securely with double-sided non-slip rug tape.  Do not have throw rugs and other things on the floor that can make you trip. What can I do in the bedroom?  Use night lights.  Make sure that you have a light by your bed that is easy to reach.  Do not use any sheets or blankets  that are too big for your bed. They should not hang down onto the floor.  Have a firm chair that has side arms. You can use this for support while you get dressed.  Do not have throw rugs and other things on the floor that can make you trip. What can I do in the kitchen?  Clean up any spills right away.  Avoid walking on wet floors.  Keep items that you use a lot in easy-to-reach places.  If you need to reach something above you, use a strong step stool that has a grab bar.  Keep electrical cords out of the way.  Do not use floor polish or wax that makes floors slippery. If you must use wax, use non-skid floor wax.  Do not have throw rugs and other  things on the floor that can make you trip. What can I do with my stairs?  Do not leave any items on the stairs.  Make sure that there are handrails on both sides of the stairs and use them. Fix handrails that are broken or loose. Make sure that handrails are as long as the stairways.  Check any carpeting to make sure that it is firmly attached to the stairs. Fix any carpet that is loose or worn.  Avoid having throw rugs at the top or bottom of the stairs. If you do have throw rugs, attach them to the floor with carpet tape.  Make sure that you have a light switch at the top of the stairs and the bottom of the stairs. If you do not have them, ask someone to add them for you. What else can I do to help prevent falls?  Wear shoes that:  Do not have high heels.  Have rubber bottoms.  Are comfortable and fit you well.  Are closed at the toe. Do not wear sandals.  If you use a stepladder:  Make sure that it is fully opened. Do not climb a closed stepladder.  Make sure that both sides of the stepladder are locked into place.  Ask someone to hold it for you, if possible.  Clearly mark and make sure that you can see:  Any grab bars or handrails.  First and last steps.  Where the edge of each step is.  Use tools that help you move around (mobility aids) if they are needed. These include:  Canes.  Walkers.  Scooters.  Crutches.  Turn on the lights when you go into a dark area. Replace any light bulbs as soon as they burn out.  Set up your furniture so you have a clear path. Avoid moving your furniture around.  If any of your floors are uneven, fix them.  If there are any pets around you, be aware of where they are.  Review your medicines with your doctor. Some medicines can make you feel dizzy. This can increase your chance of falling. Ask your doctor what other things that you can do to help prevent falls. This information is not intended to replace advice given to  you by your health care provider. Make sure you discuss any questions you have with your health care provider. Document Released: 05/26/2009 Document Revised: 01/05/2016 Document Reviewed: 09/03/2014 Elsevier Interactive Patient Education  2017 [employer].

## 2020-05-11 DIAGNOSIS — I700 Atherosclerosis of aorta: Secondary | ICD-10-CM | POA: Insufficient documentation

## 2020-05-15 ENCOUNTER — Encounter: Payer: Self-pay | Admitting: Internal Medicine

## 2020-05-15 NOTE — Assessment & Plan Note (Signed)
stable overall by history and exam, recent data reviewed with pt, and pt to continue medical treatment as before,  to f/u any worsening symptoms or concerns  

## 2020-05-15 NOTE — Assessment & Plan Note (Signed)
Cont lipitor 40 qd, low chol diet,  to f/u any worsening symptoms or concerns

## 2020-05-15 NOTE — Assessment & Plan Note (Addendum)
Seasonal flare, for depomedrol IM 80, predpac asd,  to f/u any worsening symptoms or concerns  I spent 31 minutes in preparing to see the patient by review of recent labs, imaging and procedures, obtaining and reviewing separately obtained history, communicating with the patient and family or caregiver, ordering medications, tests or procedures, and documenting clinical information in the EHR including the differential Dx, treatment, and any further evaluation and other management of allergies, htn, ckd, aortic atherosclerosis, hyperglycemia

## 2020-05-15 NOTE — Assessment & Plan Note (Signed)
stable overall by history and exam, recent data reviewed with pt, and pt to continue medical treatment as before,  to f/u any worsening symptoms or concerns, f/u renal oct 13

## 2020-05-20 ENCOUNTER — Other Ambulatory Visit: Payer: Self-pay | Admitting: Internal Medicine

## 2020-05-20 NOTE — Telephone Encounter (Signed)
Please refill as per office routine med refill policy (all routine meds refilled for 3 mo or monthly per pt preference up to one year from last visit, then month to month grace period for 3 mo, then further med refills will have to be denied)  

## 2020-05-25 DIAGNOSIS — I129 Hypertensive chronic kidney disease with stage 1 through stage 4 chronic kidney disease, or unspecified chronic kidney disease: Secondary | ICD-10-CM | POA: Diagnosis not present

## 2020-05-25 DIAGNOSIS — R7309 Other abnormal glucose: Secondary | ICD-10-CM | POA: Diagnosis not present

## 2020-05-25 DIAGNOSIS — M109 Gout, unspecified: Secondary | ICD-10-CM | POA: Diagnosis not present

## 2020-05-25 DIAGNOSIS — N183 Chronic kidney disease, stage 3 unspecified: Secondary | ICD-10-CM | POA: Diagnosis not present

## 2020-05-26 ENCOUNTER — Other Ambulatory Visit: Payer: Self-pay | Admitting: Internal Medicine

## 2020-05-26 NOTE — Telephone Encounter (Signed)
Please refill as per office routine med refill policy (all routine meds refilled for 3 mo or monthly per pt preference up to one year from last visit, then month to month grace period for 3 mo, then further med refills will have to be denied)  

## 2020-06-01 ENCOUNTER — Encounter (HOSPITAL_COMMUNITY): Payer: Self-pay | Admitting: Emergency Medicine

## 2020-06-01 ENCOUNTER — Ambulatory Visit (HOSPITAL_COMMUNITY)
Admission: EM | Admit: 2020-06-01 | Discharge: 2020-06-01 | Disposition: A | Discharge status: Home / Self Care | Payer: Medicare Other | Attending: Emergency Medicine | Admitting: Emergency Medicine

## 2020-06-01 ENCOUNTER — Other Ambulatory Visit: Payer: Self-pay

## 2020-06-01 DIAGNOSIS — I10 Essential (primary) hypertension: Secondary | ICD-10-CM

## 2020-06-01 DIAGNOSIS — R519 Headache, unspecified: Secondary | ICD-10-CM | POA: Diagnosis not present

## 2020-06-01 LAB — CBG MONITORING, ED: Glucose-Capillary: 98 mg/dL (ref 70–99)

## 2020-06-01 MED ORDER — ACETAMINOPHEN 325 MG PO TABS
ORAL_TABLET | ORAL | Status: AC
  Filled 2020-06-01: qty 3

## 2020-06-01 MED ORDER — ACETAMINOPHEN 325 MG PO TABS
975.0000 mg | ORAL_TABLET | Freq: Once | ORAL | Status: AC
  Administered 2020-06-01: 975 mg via ORAL

## 2020-06-01 NOTE — ED Triage Notes (Signed)
Pt states he saw his kidney doctor on 10/13 and was told his blood pressure was high. He states he had a home health come on Monday and it was running high again. He states this morning he woke up with a headache. He does not recall the blood pressure readings.

## 2020-06-01 NOTE — ED Provider Notes (Signed)
[place]    CSN: [id number] Arrival date & time: 06/01/20  0807      History   Chief Complaint Chief Complaint  Patient presents with  . Hypertension  . Headache    HPI Troy Palmer is a 74 y.o. male.   Troy Palmer presents with complaints of headache. States he was told his blood pressure was high on 10/13, at another appointment, he is uncertain how high, however. States this morning he had some headache which concerned him about his blood pressure. No dizziness. No vision changes. No arm or jaw pain. No nausea or diaphoresis. No new extremity swelling. No weakness or confusion. Hasn't taken any medications for his pain. Does take his prescribed medication regularly. Has had similar headaches in the past and has used tylenol effectively. He also inquires about his blood sugar as well, stating he doesn't      ROS per HPI, negative if not otherwise mentioned.      Past Medical History:  Diagnosis Date  . ALLERGIC RHINITIS 04/09/2007  . ASTHMA 12/04/2007  . BACK PAIN 09/16/2009  . BENIGN PROSTATIC HYPERTROPHY 04/09/2007  . CEREBROVASCULAR ACCIDENT, HX OF 07/17/2010  . COLONIC POLYPS, HX OF 12/04/2007  . CONSTIPATION 09/25/2010  . Degeneration of cervical intervertebral disc 03/28/2007  . DEPRESSION 12/04/2007  . DIVERTICULOSIS, COLON 12/04/2007  . DYSPHAGIA UNSPECIFIED 03/16/2008  . ERECTILE DYSFUNCTION 04/09/2007  . ESOPHAGEAL STRICTURE 05/26/2008  . GERD 12/04/2007  . HEMORRHOIDS, RECURRENT 02/11/2008  . HYPERLIPIDEMIA 12/04/2007  . HYPERTENSION 03/28/2007  . LEG PAIN, BILATERAL 01/25/2010  . LOW BACK PAIN 04/09/2007  . LUNG NODULE 12/04/2007  . Osteoarth NOS-Unspec 03/28/2007  . Other dysphagia 11/21/2009  . Peripheral neuropathy 05/29/2018  . POLYARTHRALGIA 07/13/2008  . Polymyalgia rheumatica ([place]) 07/26/2008  . PVD WITH CLAUDICATION 02/10/2010  . RENAL INSUFFICIENCY 03/15/2010  . SLEEP APNEA, OBSTRUCTIVE, MODERATE 04/09/2007  . TB SKIN TEST, POSITIVE  03/28/2007    Patient Active Problem List   Diagnosis Date Noted  . Aortic atherosclerosis ([place]) 05/11/2020  . Dysuria 03/09/2020  . Constipation 03/09/2020  . Vitamin D deficiency 12/05/2019  . B12 deficiency 12/05/2019  . Contact dermatitis 09/08/2019  . Left knee pain 06/04/2019  . Neck pain 02/23/2019  . Recurrent falls 08/19/2018  . Abdominal pain 07/25/2018  . Peripheral neuropathy 05/29/2018  . Paresthesia of bilateral legs 05/05/2018  . Cough variant asthma  vs UACS assoc with pnds 02/27/2018  . Asthmatic bronchitis 02/10/2018  . Acute respiratory failure with hypoxia ([place]) 02/09/2018  . Pain, dental 02/03/2018  . Cough 02/03/2018  . Wheezing 02/03/2018  . Pain and swelling of lower leg, right 12/31/2017  . BPH with obstruction/lower urinary tract symptoms 05/16/2017  . Bilateral ankle pain 05/16/2017  . Acute gout 02/26/2017  . DOE (dyspnea on exertion) 11/16/2016  . Complete tear of right rotator cuff 06/26/2016  . Cellulitis of deltoid region 06/26/2016  . Greater trochanteric bursitis of right hip 05/05/2014  . Right hip pain 05/04/2014  . Posterior neck pain 12/22/2013  . Orchitis, right 12/22/2013  . Paresthesia of both feet 12/22/2013  . Bilateral shoulder pain 09/16/2013  . Allergic angioedema 07/31/2012  . Impaired glucose tolerance 04/15/2012  . Chest pain 02/24/2012  . Foot pain, bilateral 02/20/2012  . Leg pain, bilateral 01/19/2012  . Edema 01/16/2012  . Dysphagia 11/21/2011  . Gout 06/07/2011  . CKD (chronic kidney disease) 06/07/2011  . Encounter for well adult exam with abnormal findings 02/14/2011  . Encounter for long-term (  current) use of other medications 11/08/2010  . CONSTIPATION 09/25/2010  . Memory loss 06/28/2010  . PVD (peripheral vascular disease) ([place]) 02/10/2010  . LEG PAIN, BILATERAL 01/25/2010  . Epigastric pain 11/21/2009  . Backache 09/16/2009  . Polymyalgia rheumatica ([place]) 07/26/2008  . POLYARTHRALGIA 07/13/2008  .  ESOPHAGEAL STRICTURE 05/26/2008  . HEMORRHOIDS, RECURRENT 02/11/2008  . Hyperlipidemia 12/04/2007  . DEPRESSION 12/04/2007  . Asthma 12/04/2007  . LUNG NODULE 12/04/2007  . GERD 12/04/2007  . DIVERTICULOSIS, COLON 12/04/2007  . COLONIC POLYPS, HX OF 12/04/2007  . ERECTILE DYSFUNCTION 04/09/2007  . SLEEP APNEA, OBSTRUCTIVE, MODERATE 04/09/2007  . Allergic rhinitis 04/09/2007  . BENIGN PROSTATIC HYPERTROPHY 04/09/2007  . LOW BACK PAIN 04/09/2007  . Morbid obesity due to excess calories ([place]) 03/28/2007  . Hypertension 03/28/2007  . Osteoarth NOS-Unspec 03/28/2007  . Degeneration of cervical intervertebral disc 03/28/2007  . TB SKIN TEST, POSITIVE 03/28/2007    Past Surgical History:  Procedure Laterality Date  . ROTATOR CUFF REPAIR     Left  . TOTAL KNEE ARTHROPLASTY     x 2       Home Medications    Prior to Admission medications   Medication Sig Start Date End Date Taking? Authorizing Provider  acetaminophen (TYLENOL 8 HOUR) 650 MG CR tablet Take 650 mg by mouth every 6 (six) hours as needed (pain/headache).   Yes [provider]  allopurinol (ZYLOPRIM) 100 MG tablet TAKE 1 TABLET BY MOUTH ONCE Troy 05/27/20  Yes Biagio Borg, MD  amLODipine (NORVASC) 5 MG tablet TAKE 1 TABLET(5 MG) BY MOUTH Troy 05/13/20  Yes Biagio Borg, MD  atorvastatin (LIPITOR) 40 MG tablet TAKE 1 TABLET(40 MG) BY MOUTH Troy 05/23/20  Yes Biagio Borg, MD  azelastine (ASTELIN) 0.1 % nasal spray Place 2 sprays into both nostrils 2 (two) times Troy. Use in each nostril as directed 01/07/20  Yes Biagio Borg, MD  Azelastine-Fluticasone 137-50 MCG/ACT SUSP 1 spray each nostril twice per day 01/01/20  Yes Biagio Borg, MD  colchicine 0.6 MG tablet Take 1 tablet (0.6 mg total) by mouth Troy. For gout pain 06/22/19  Yes Gregor Hams, MD  cyclobenzaprine (FLEXERIL) 5 MG tablet Take 1 tablet (5 mg total) by mouth 3 (three) times Troy as needed for muscle spasms. 03/09/20  Yes Biagio Borg, MD    fluticasone [facility]) 50 MCG/ACT nasal spray Place 2 sprays into both nostrils Troy. 10/20/19  Yes Biagio Borg, MD  furosemide (LASIX) 40 MG tablet TAKE 1 AND 1/2 TABLETS BY MOUTH TWICE Troy 02/10/20  Yes Biagio Borg, MD  hydrALAZINE (APRESOLINE) 25 MG tablet Take 1 tablet (25 mg total) by mouth in the morning and at bedtime. 12/03/19  Yes Biagio Borg, MD  HYDROcodone-acetaminophen (NORCO) 7.5-325 MG tablet Take 1-2 tablets by mouth every 6 (six) hours as needed for moderate pain. 06/22/19  Yes Gregor Hams, MD  levocetirizine (XYZAL) 5 MG tablet Take 1 tablet (5 mg total) by mouth every evening. 01/07/20 01/06/21 Yes Biagio Borg, MD  lidocaine (LIDODERM) 5 % Place 1 patch onto the skin Troy. Apply patch to the left shoulder area. Remove & Discard patch within 12 hours or as directed by MD 02/17/19  Yes Petrucelli, Samantha R, PA-C  Nebivolol HCl (BYSTOLIC) 20 MG TABS 1 tab by mouth Troy 05/10/20  Yes Biagio Borg, MD  pantoprazole (PROTONIX) 40 MG tablet Take 1 tablet (40 mg total) by mouth Troy. 12/03/19  Yes John,  Hunt Oris, MD  predniSONE (DELTASONE) 10 MG tablet 3 tabs by mouth per day for 3 days,2tabs per day for 3 days,1tab per day for 3 days 05/10/20  Yes Biagio Borg, MD  tamsulosin (FLOMAX) 0.4 MG CAPS capsule TAKE 1 CAPSULE(0.4 MG) BY MOUTH TWICE Troy 01/28/20  Yes Biagio Borg, MD  traMADol (ULTRAM) 50 MG tablet Take 1 tablet (50 mg total) by mouth every 6 (six) hours as needed. 03/09/20  Yes Biagio Borg, MD  triamcinolone cream (KENALOG) 0.1 % Apply 1 application topically 2 (two) times Troy. 09/08/19 09/07/20 Yes Biagio Borg, MD  vitamin B-12 (CYANOCOBALAMIN) 1000 MCG tablet Take 1 tablet (1,000 mcg total) by mouth Troy. 12/03/19  Yes Biagio Borg, MD    Family History Family History  Problem Relation Age of Onset  . Healthy Mother   . Heart disease Father   . Lupus Brother   . Hypertension Brother   . Asthma Other     Social History Social History   Tobacco Use  .  Smoking status: Never Smoker  . Smokeless tobacco: Never Used  Vaping Use  . Vaping Use: Never used  Substance Use Topics  . Alcohol use: No  . Drug use: No     Allergies   Ace inhibitors, Augmentin [amoxicillin-pot clavulanate], Doxycycline, and Sulfa antibiotics   Review of Systems Review of Systems   Physical Exam Triage Vital Signs ED Triage Vitals  Enc Vitals Group     BP 06/01/20 0826 129/70     Pulse Rate 06/01/20 0826 (!) 58     Resp 06/01/20 0826 18     Temp 06/01/20 0826 98.2 F (36.8 C)     Temp Source 06/01/20 0826 Oral     SpO2 06/01/20 0826 97 %     Weight --      Height --      Head Circumference --      Peak Flow --      Pain Score 06/01/20 0823 9     Pain Loc --      Pain Edu? --      Excl. in [place]? --    No data found.  Updated Vital Signs BP 129/70 (BP Location: Left Arm)   Pulse (!) 58   Temp 98.2 F (36.8 C) (Oral)   Resp 18   SpO2 97%   Visual Acuity Right Eye Distance:   Left Eye Distance:   Bilateral Distance:    Right Eye Near:   Left Eye Near:    Bilateral Near:     Physical Exam Constitutional:      Appearance: He is well-developed.  HENT:     Head: Normocephalic and atraumatic.     Mouth/Throat:     Mouth: Mucous membranes are moist.  Eyes:     Extraocular Movements: Extraocular movements intact.     Pupils: Pupils are equal, round, and reactive to light.  Cardiovascular:     Rate and Rhythm: Normal rate.  Pulmonary:     Effort: Pulmonary effort is normal.  Skin:    General: Skin is warm and dry.  Neurological:     Mental Status: He is alert and oriented to person, place, and time.     Cranial Nerves: No cranial nerve deficit.     Sensory: No sensory deficit.  Psychiatric:        Mood and Affect: Mood normal.        Speech: Speech normal.  UC Treatments / Results  Labs (all labs ordered are listed, but only abnormal results are displayed) Labs Reviewed  CBG MONITORING, ED    EKG   Radiology No  results found.  Procedures Procedures (including critical care time)  Medications Ordered in UC Medications  acetaminophen (TYLENOL) tablet 975 mg (975 mg Oral Given 06/01/20 0850)    Initial Impression / Assessment and Plan / UC Course  I have reviewed the triage vital signs and the nursing notes.  Pertinent labs & imaging results that were available during my care of the patient were reviewed by me and considered in my medical decision making (see chart for details).     Non toxic. Benign physical exam.  No red flag findings. Headache is improving. bp is stable today. Patient over voiced concern about health after being told about his blood pressure and asked about his diabetes by his home health nurse. Blood sugar looks well this morning also. Tylenol given here today. Continue to follow up with his PCP for recheck or return if worsening. Patient verbalized understanding and agreeable to plan.   Final Clinical Impressions(s) / UC Diagnoses   Final diagnoses:  Acute nonintractable headache, unspecified headache type     Discharge Instructions     Your blood pressure and your blood sugar both look great today.  Continue with your prescribed medications.  Please follow up with your primary care provider in the next two months or as needed, for a recheck.  Tylenol as needed for headache.  Please return for any worsening of headache, vision changes, dizziness, or otherwise worsening     ED Prescriptions    None     PDMP not reviewed this encounter.   Zigmund Gottron, NP 06/01/20 [phone]

## 2020-06-01 NOTE — Discharge Instructions (Signed)
Your blood pressure and your blood sugar both look great today.  Continue with your prescribed medications.  Please follow up with your primary care provider in the next two months or as needed, for a recheck.  Tylenol as needed for headache.  Please return for any worsening of headache, vision changes, dizziness, or otherwise worsening

## 2020-06-26 IMAGING — DX DG KNEE COMPLETE 4+V*L*
4 series · 4 of 4 positions shown · non-contrast
Comparison: 06/04/2019

CLINICAL DATA: Left knee pain

EXAM:
LEFT KNEE - COMPLETE 4+ VIEW

[knee ap]
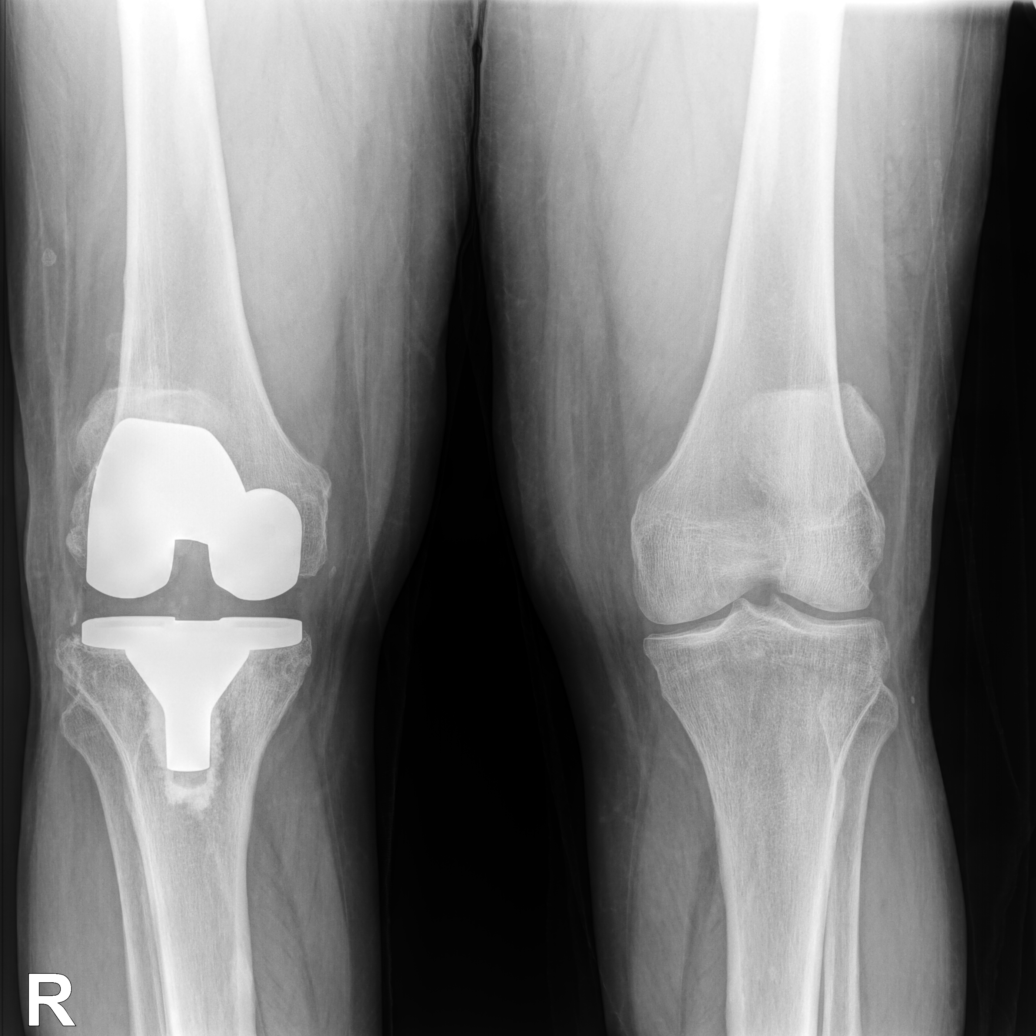

[knee weight bearing ap]
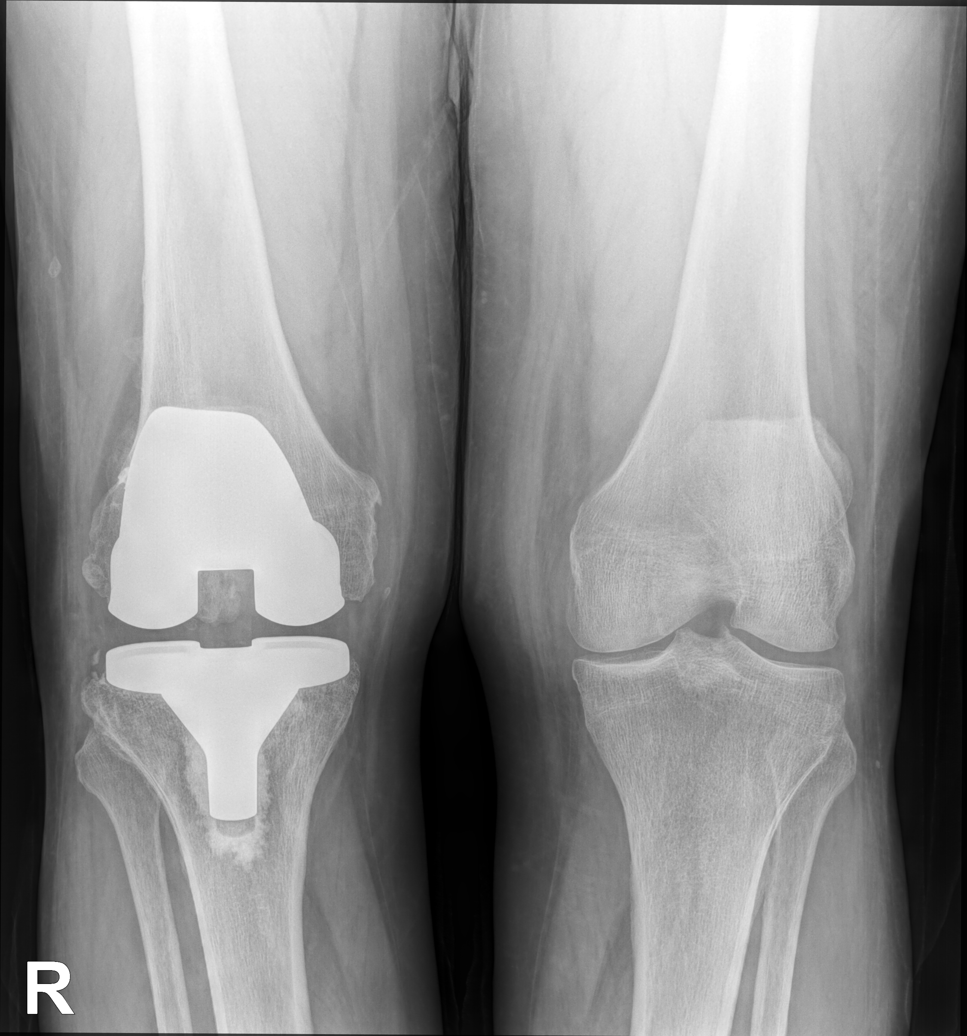

[sunrise]
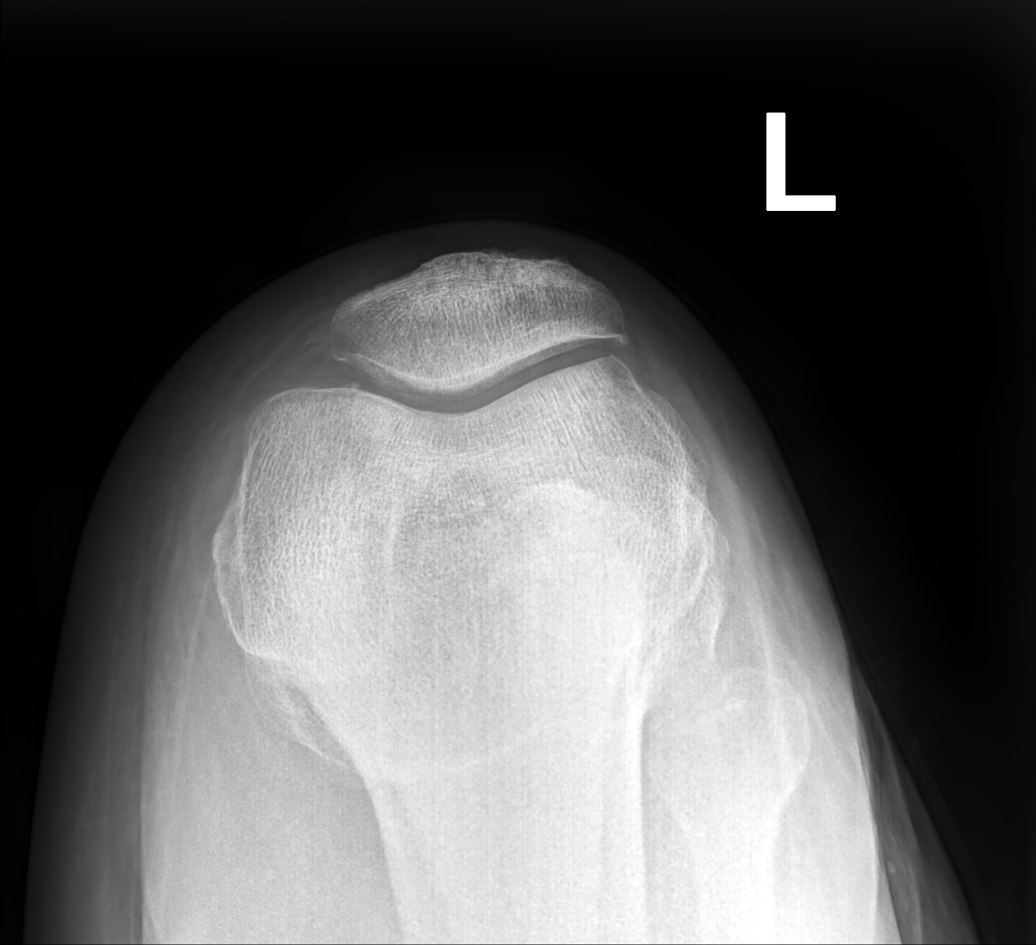

[patella lat]
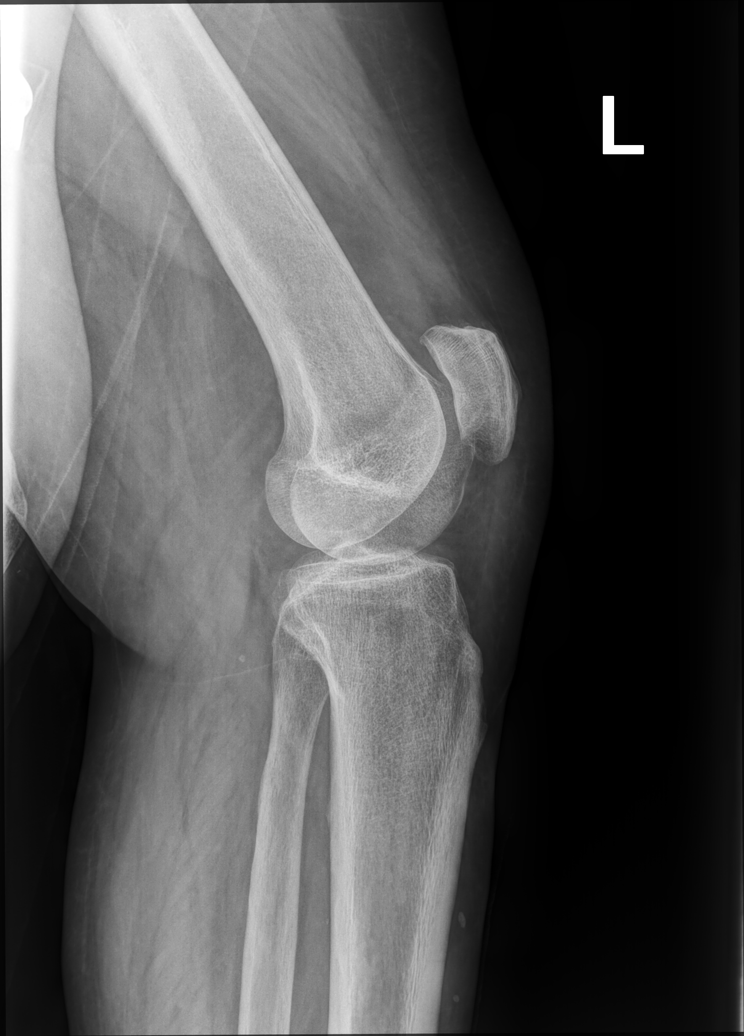

[4 of 4 positions shown; findings below may reference images not displayed]

FINDINGS: Right knee prosthesis is noted. No acute fracture or dislocation is
seen. Mild patellofemoral degenerative change is noted. Small joint
effusion is again seen. No other focal abnormality is noted.
IMPRESSION: Mild patellofemoral degenerative change.

Small joint effusion is again noted.

## 2020-08-24 ENCOUNTER — Telehealth: Payer: Self-pay | Admitting: Internal Medicine

## 2020-08-24 MED ORDER — METOPROLOL SUCCINATE ER 50 MG PO TB24: 50.0000 mg | ORAL_TABLET | Freq: Every day | ORAL | 1 refills | Status: DC

## 2020-08-24 NOTE — Telephone Encounter (Signed)
[place] for change to toprol xl 50 qd - done erx

## 2020-08-24 NOTE — Telephone Encounter (Signed)
Nebivolol HCl (BYSTOLIC) 20 MG TABS This is no longer covered under his insurance they recommended these that are covered- Free-  Atenolol meoprolol cardzedilol    [facility] [phone] - [place], [address] AT [address] Phone:  [phone]  Fax:  [phone]

## 2020-08-25 NOTE — Telephone Encounter (Signed)
Notified pt w/MD response.../lmb 

## 2020-09-15 MED ORDER — NEBIVOLOL HCL 20 MG PO TABS: ORAL_TABLET | ORAL | 3 refills | Status: DC

## 2020-09-15 NOTE — Telephone Encounter (Signed)
Done erx 

## 2020-09-15 NOTE — Telephone Encounter (Signed)
   Patient requesting to start taking Bystolic again. He states his new plan does cover Bystolic Patient reports the Toprol makes him feel dizzy  Pharmacy [facility] [place], [place]

## 2020-09-15 NOTE — Addendum Note (Signed)
Addended by: Biagio Borg on: 09/15/2020 09:15 PM   Modules accepted: Orders

## 2020-09-16 NOTE — Telephone Encounter (Signed)
Pt notified that Bystolic has been ordered; states he has already picked it up.  Denies further ques/concerns at this time.

## 2020-09-19 ENCOUNTER — Other Ambulatory Visit: Payer: Self-pay

## 2020-09-27 ENCOUNTER — Telehealth: Payer: Self-pay | Admitting: Internal Medicine

## 2020-09-27 NOTE — Telephone Encounter (Signed)
Please refill as per office routine med refill policy (all routine meds refilled for 3 mo or monthly per pt preference up to one year from last visit, then month to month grace period for 3 mo, then further med refills will have to be denied)  

## 2020-09-29 NOTE — Telephone Encounter (Signed)
Patient calling to check on status of refill.

## 2020-09-29 NOTE — Telephone Encounter (Signed)
Message left for patient today that script was faxed in this morning.

## 2020-10-03 ENCOUNTER — Other Ambulatory Visit: Payer: Self-pay

## 2020-10-04 ENCOUNTER — Ambulatory Visit (INDEPENDENT_AMBULATORY_CARE_PROVIDER_SITE_OTHER): Payer: Medicare Other | Admitting: Internal Medicine

## 2020-10-04 ENCOUNTER — Encounter: Payer: Self-pay | Admitting: Internal Medicine

## 2020-10-04 VITALS — BP 126/80 | HR 55 | Temp 97.7°F | Resp 18 | Ht 69.0 in | Wt 283.6 lb

## 2020-10-04 DIAGNOSIS — I700 Atherosclerosis of aorta: Secondary | ICD-10-CM

## 2020-10-04 DIAGNOSIS — N1831 Chronic kidney disease, stage 3a: Secondary | ICD-10-CM

## 2020-10-04 DIAGNOSIS — R7302 Impaired glucose tolerance (oral): Secondary | ICD-10-CM | POA: Diagnosis not present

## 2020-10-04 DIAGNOSIS — Z1211 Encounter for screening for malignant neoplasm of colon: Secondary | ICD-10-CM

## 2020-10-04 DIAGNOSIS — Z0001 Encounter for general adult medical examination with abnormal findings: Secondary | ICD-10-CM | POA: Diagnosis not present

## 2020-10-04 DIAGNOSIS — E782 Mixed hyperlipidemia: Secondary | ICD-10-CM | POA: Diagnosis not present

## 2020-10-04 DIAGNOSIS — E538 Deficiency of other specified B group vitamins: Secondary | ICD-10-CM

## 2020-10-04 DIAGNOSIS — I10 Essential (primary) hypertension: Secondary | ICD-10-CM | POA: Diagnosis not present

## 2020-10-04 DIAGNOSIS — E559 Vitamin D deficiency, unspecified: Secondary | ICD-10-CM

## 2020-10-04 LAB — LIPID PANEL
Cholesterol: 175 mg/dL (ref 0–200)
HDL: 49.8 mg/dL (ref >39.00)
LDL Cholesterol: 108 mg/dL — ABNORMAL HIGH (ref 0–99)
NonHDL: 125.26
Total CHOL/HDL Ratio: 4
Triglycerides: 87 mg/dL (ref 0.0–149.0)
VLDL: 17.4 mg/dL (ref 0.0–40.0)

## 2020-10-04 LAB — HEPATIC FUNCTION PANEL
ALT: 8 U/L (ref 0–53)
AST: 13 U/L (ref 0–37)
Albumin: 3.7 g/dL (ref 3.5–5.2)
Alkaline Phosphatase: 112 U/L (ref 39–117)
Bilirubin, Direct: 0.1 mg/dL (ref 0.0–0.3)
Total Bilirubin: 0.5 mg/dL (ref 0.2–1.2)
Total Protein: 6.8 g/dL (ref 6.0–8.3)

## 2020-10-04 LAB — URINALYSIS, ROUTINE W REFLEX MICROSCOPIC
Bilirubin Urine: NEGATIVE
Hgb urine dipstick: NEGATIVE
Ketones, ur: NEGATIVE
Leukocytes,Ua: NEGATIVE
Nitrite: NEGATIVE
RBC / HPF: NONE SEEN (ref 0–?)
Specific Gravity, Urine: 1.015 (ref 1.000–1.030)
Total Protein, Urine: NEGATIVE
Urine Glucose: NEGATIVE
Urobilinogen, UA: 0.2 (ref 0.0–1.0)
pH: 6 (ref 5.0–8.0)

## 2020-10-04 LAB — BASIC METABOLIC PANEL
BUN: 41 mg/dL — ABNORMAL HIGH (ref 6–23)
CO2: 28 mEq/L (ref 19–32)
Calcium: 9.3 mg/dL (ref 8.4–10.5)
Chloride: 106 mEq/L (ref 96–112)
Creatinine, Ser: 2.52 mg/dL — ABNORMAL HIGH (ref 0.40–1.50)
GFR: 24.5 mL/min — ABNORMAL LOW (ref >60.00)
Glucose, Bld: 66 mg/dL — ABNORMAL LOW (ref 70–99)
Potassium: 4.3 mEq/L (ref 3.5–5.1)
Sodium: 141 mEq/L (ref 135–145)

## 2020-10-04 LAB — CBC WITH DIFFERENTIAL/PLATELET
Basophils Absolute: 0 10*3/uL (ref 0.0–0.1)
Basophils Relative: 0.7 % (ref 0.0–3.0)
Eosinophils Absolute: 0.2 10*3/uL (ref 0.0–0.7)
Eosinophils Relative: 4.5 % (ref 0.0–5.0)
HCT: 38.8 % — ABNORMAL LOW (ref 39.0–52.0)
Hemoglobin: 12.5 g/dL — ABNORMAL LOW (ref 13.0–17.0)
Lymphocytes Relative: 29.4 % (ref 12.0–46.0)
Lymphs Abs: 1.4 10*3/uL (ref 0.7–4.0)
MCHC: 32.2 g/dL (ref 30.0–36.0)
MCV: 84.6 fl (ref 78.0–100.0)
Monocytes Absolute: 0.8 10*3/uL (ref 0.1–1.0)
Monocytes Relative: 17.7 % — ABNORMAL HIGH (ref 3.0–12.0)
Neutro Abs: 2.3 10*3/uL (ref 1.4–7.7)
Neutrophils Relative %: 47.7 % (ref 43.0–77.0)
Platelets: 228 10*3/uL (ref 150.0–400.0)
RBC: 4.58 Mil/uL (ref 4.22–5.81)
RDW: 14.6 % (ref 11.5–15.5)
WBC: 4.8 10*3/uL (ref 4.0–10.5)

## 2020-10-04 LAB — TSH: TSH: 2.02 u[IU]/mL (ref 0.35–4.50)

## 2020-10-04 LAB — HEMOGLOBIN A1C: Hgb A1c MFr Bld: 5.8 % (ref 4.6–6.5)

## 2020-10-04 LAB — VITAMIN D 25 HYDROXY (VIT D DEFICIENCY, FRACTURES): VITD: 20.16 ng/mL — ABNORMAL LOW (ref 30.00–100.00)

## 2020-10-04 LAB — PSA: PSA: 1.3 ng/mL (ref 0.10–4.00)

## 2020-10-04 LAB — PHOSPHORUS: Phosphorus: 2.8 mg/dL (ref 2.3–4.6)

## 2020-10-04 LAB — VITAMIN B12: Vitamin B-12: 205 pg/mL — ABNORMAL LOW (ref 211–911)

## 2020-10-04 MED ORDER — METOPROLOL SUCCINATE ER 50 MG PO TB24: 50.0000 mg | ORAL_TABLET | Freq: Every day | ORAL | 3 refills | Status: DC

## 2020-10-04 MED ORDER — TRAMADOL HCL 50 MG PO TABS
50.0000 mg | ORAL_TABLET | Freq: Four times a day (QID) | ORAL | 2 refills | Status: DC | PRN
Start: 2020-10-04 — End: 2020-10-26

## 2020-10-04 NOTE — Progress Notes (Signed)
Patient ID: Troy Palmer, male   DOB: 25-Sep-1945, 75 y.o.   MRN: [id number]         Chief Complaint:: wellness exam and Back Pain (Rm 11. Chronic back pain. He stated that it's separated in 2 places. Doesn't want to have surgery. 7/10 pain at present. Has not taken anything for pain. Occasionally he will take otc Tylenol. )  and htn, chronic lbp       HPI:  Troy Palmer is a 75 y.o. male here for wellness exam; due for colonoscopy; o/w up to date with preventive referrals, and immunizations                        Also with chronic lbp with bilateral sciatica, primarily midline lbp with radiation to bilat buttocks and occas LEs, stable moderate intermittent, without worsening LE weakness or falls.  Lying down and sitting help.  Also bystolic is no longer covered by his insurance. Pt denies chest pain, increased sob or doe, wheezing, orthopnea, PND, increased LE swelling, palpitations, dizziness or syncope.   Pt denies polydipsia, polyuria, denies new focal neuro s/s.   Wt Readings from Last 3 Encounters:  10/04/20 283 lb 9.6 oz (128.6 kg)  05/10/20 283 lb 12.8 oz (128.7 kg)  05/10/20 283 lb (128.4 kg)   BP Readings from Last 3 Encounters:  10/04/20 126/80  06/01/20 129/70  05/10/20 104/60   Immunization History  Administered Date(s) Administered  . Fluad Quad(high Dose 65+) 06/04/2019  . Influenza Whole 05/26/2008, 05/21/2012, 05/11/2013  . Influenza, High Dose Seasonal PF 05/05/2018  . Influenza-Unspecified 05/29/2014  . PFIZER(Purple Top)SARS-COV-2 Vaccination 01/14/2020, 02/04/2020, 08/16/2020  . Pneumococcal Conjugate-13 10/15/2013  . Pneumococcal Polysaccharide-23 10/15/2011  . Tetanus 10/14/2012  There are no preventive care reminders to display for this patient.    Past Medical History:  Diagnosis Date  . ALLERGIC RHINITIS 04/09/2007  . ASTHMA 12/04/2007  . BACK PAIN 09/16/2009  . BENIGN PROSTATIC HYPERTROPHY 04/09/2007  . CEREBROVASCULAR ACCIDENT, HX OF 07/17/2010  .  COLONIC POLYPS, HX OF 12/04/2007  . CONSTIPATION 09/25/2010  . Degeneration of cervical intervertebral disc 03/28/2007  . DEPRESSION 12/04/2007  . DIVERTICULOSIS, COLON 12/04/2007  . DYSPHAGIA UNSPECIFIED 03/16/2008  . ERECTILE DYSFUNCTION 04/09/2007  . ESOPHAGEAL STRICTURE 05/26/2008  . GERD 12/04/2007  . HEMORRHOIDS, RECURRENT 02/11/2008  . HYPERLIPIDEMIA 12/04/2007  . HYPERTENSION 03/28/2007  . LEG PAIN, BILATERAL 01/25/2010  . LOW BACK PAIN 04/09/2007  . LUNG NODULE 12/04/2007  . Osteoarth NOS-Unspec 03/28/2007  . Other dysphagia 11/21/2009  . Peripheral neuropathy 05/29/2018  . POLYARTHRALGIA 07/13/2008  . Polymyalgia rheumatica ([place]) 07/26/2008  . PVD WITH CLAUDICATION 02/10/2010  . RENAL INSUFFICIENCY 03/15/2010  . SLEEP APNEA, OBSTRUCTIVE, MODERATE 04/09/2007  . TB SKIN TEST, POSITIVE 03/28/2007   Past Surgical History:  Procedure Laterality Date  . ROTATOR CUFF REPAIR     Left  . TOTAL KNEE ARTHROPLASTY     x 2    reports that he has never smoked. He has never used smokeless tobacco. He reports that he does not drink alcohol and does not use drugs. family history includes Asthma in an other family member; Healthy in his mother; Heart disease in his father; Hypertension in his brother; Lupus in his brother. Allergies  Allergen Reactions  . Ace Inhibitors Swelling    Angioedema throat  . Augmentin [Amoxicillin-Pot Clavulanate] Other (See Comments)    Marked weakness with po med x 3 doses  . Doxycycline  Some GI upset  . Sulfa Antibiotics Hives    And facial angioedema   Current Outpatient Medications on File Prior to Visit  Medication Sig Dispense Refill  . acetaminophen (TYLENOL) 650 MG CR tablet Take 650 mg by mouth every 6 (six) hours as needed (pain/headache).    Marland Kitchen allopurinol (ZYLOPRIM) 100 MG tablet TAKE 1 TABLET BY MOUTH ONCE DAILY 90 tablet 1  . amLODipine (NORVASC) 5 MG tablet TAKE 1 TABLET(5 MG) BY MOUTH DAILY 30 tablet 5  . atorvastatin (LIPITOR) 40 MG tablet TAKE 1  TABLET(40 MG) BY MOUTH DAILY 90 tablet 1  . azelastine (ASTELIN) 0.1 % nasal spray Place 2 sprays into both nostrils 2 (two) times daily. Use in each nostril as directed 30 mL 12  . Azelastine-Fluticasone 137-50 MCG/ACT SUSP 1 spray each nostril twice per day 23 g 11  . colchicine 0.6 MG tablet Take 1 tablet (0.6 mg total) by mouth daily. For gout pain 30 tablet 1  . cyclobenzaprine (FLEXERIL) 5 MG tablet Take 1 tablet (5 mg total) by mouth 3 (three) times daily as needed for muscle spasms. 40 tablet 2  . fluticasone (FLONASE) 50 MCG/ACT nasal spray Place 2 sprays into both nostrils daily. 16 g 6  . furosemide (LASIX) 40 MG tablet TAKE 1 AND 1/2 TABLETS BY MOUTH TWICE DAILY 270 tablet 1  . hydrALAZINE (APRESOLINE) 25 MG tablet Take 1 tablet (25 mg total) by mouth in the morning and at bedtime. 180 tablet 3  . levocetirizine (XYZAL) 5 MG tablet Take 1 tablet (5 mg total) by mouth every evening. 30 tablet 11  . lidocaine (LIDODERM) 5 % Place 1 patch onto the skin daily. Apply patch to the left shoulder area. Remove & Discard patch within 12 hours or as directed by MD 14 patch 0  . pantoprazole (PROTONIX) 40 MG tablet Take 1 tablet (40 mg total) by mouth daily. 90 tablet 3  . tamsulosin (FLOMAX) 0.4 MG CAPS capsule TAKE 1 CAPSULE(0.4 MG) BY MOUTH TWICE DAILY 180 capsule 3  . vitamin B-12 (CYANOCOBALAMIN) 1000 MCG tablet Take 1 tablet (1,000 mcg total) by mouth daily. 90 tablet 1   No current facility-administered medications on file prior to visit.        ROS:  All others reviewed and negative.  Objective        PE:  BP 126/80   Pulse (!) 55   Temp 97.7 F (36.5 C) (Oral)   Resp 18   Ht '5\' 9"'$  (1.753 m)   Wt 283 lb 9.6 oz (128.6 kg)   SpO2 99%   BMI 41.88 kg/m                 Constitutional: Pt appears in NAD               HENT: Head: NCAT.                Right Ear: External ear normal.                 Left Ear: External ear normal.                Eyes: . Pupils are equal, round, and  reactive to light. Conjunctivae and EOM are normal               Nose: without d/c or deformity               Neck: Neck supple. Gross normal ROM  Cardiovascular: Normal rate and regular rhythm.                 Pulmonary/Chest: Effort normal and breath sounds without rales or wheezing.                Abd:  Soft, NT, ND, + BS, no organomegaly               Neurological: Pt is alert. At baseline orientation, motor grossly intact               Skin: Skin is warm. No rashes, no other new lesions, LE edema - trace bialt               Psychiatric: Pt behavior is normal without agitation   Micro: none  Cardiac tracings I have personally interpreted today:  none  Pertinent Radiological findings (summarize): none   Lab Results  Component Value Date   WBC 4.8 10/04/2020   HGB 12.5 (L) 10/04/2020   HCT 38.8 (L) 10/04/2020   PLT 228.0 10/04/2020   GLUCOSE 66 (L) 10/04/2020   CHOL 175 10/04/2020   TRIG 87.0 10/04/2020   HDL 49.80 10/04/2020   LDLDIRECT 156.9 10/10/2010   LDLCALC 108 (H) 10/04/2020   ALT 8 10/04/2020   AST 13 10/04/2020   NA 141 10/04/2020   K 4.3 10/04/2020   CL 106 10/04/2020   CREATININE 2.52 (H) 10/04/2020   BUN 41 (H) 10/04/2020   CO2 28 10/04/2020   TSH 2.02 10/04/2020   PSA 1.30 10/04/2020   HGBA1C 5.8 10/04/2020   Assessment/Plan:  JAVAROUS MATULEWICZ is a 75 y.o. Black or African American [2] male with  has a past medical history of ALLERGIC RHINITIS (04/09/2007), ASTHMA (12/04/2007), BACK PAIN (09/16/2009), BENIGN PROSTATIC HYPERTROPHY (04/09/2007), CEREBROVASCULAR ACCIDENT, HX OF (07/17/2010), COLONIC POLYPS, HX OF (12/04/2007), CONSTIPATION (09/25/2010), Degeneration of cervical intervertebral disc (03/28/2007), DEPRESSION (12/04/2007), DIVERTICULOSIS, COLON (12/04/2007), DYSPHAGIA UNSPECIFIED (03/16/2008), ERECTILE DYSFUNCTION (04/09/2007), ESOPHAGEAL STRICTURE (05/26/2008), GERD (12/04/2007), HEMORRHOIDS, RECURRENT (02/11/2008), HYPERLIPIDEMIA (12/04/2007),  HYPERTENSION (03/28/2007), LEG PAIN, BILATERAL (01/25/2010), LOW BACK PAIN (04/09/2007), LUNG NODULE (12/04/2007), Osteoarth NOS-Unspec (03/28/2007), Other dysphagia (11/21/2009), Peripheral neuropathy (05/29/2018), POLYARTHRALGIA (07/13/2008), Polymyalgia rheumatica ([place]) (07/26/2008), PVD WITH CLAUDICATION (02/10/2010), RENAL INSUFFICIENCY (03/15/2010), SLEEP APNEA, OBSTRUCTIVE, MODERATE (04/09/2007), and TB SKIN TEST, POSITIVE (03/28/2007).  Encounter for well adult exam with abnormal findings Age and sex appropriate education and counseling updated with regular exercise and diet Referrals for preventative services - for colonoscopy Immunizations addressed - none needed Smoking counseling  - none needed Evidence for depression or other mood disorder - none significant Most recent labs reviewed. I have personally reviewed and have noted: 1) the patient's medical and social history 2) The patient's current medications and supplements 3) The patient's height, weight, and BMI have been recorded in the chart   Aortic atherosclerosis ([place]) To continue lovastatin and lower chol diet   B12 deficiency Lab Results  Component Value Date   VITAMINB12 205 (L) 10/04/2020   Slow, to start oral replacement - b12 1000 mcg qd  CKD (chronic kidney disease) Lab Results  Component Value Date   CREATININE 2.52 (H) 10/04/2020   Stable overall, cont to avoid nephrotoxins   Hyperlipidemia Lab Results  Component Value Date   LDLCALC 108 (H) 10/04/2020   Stable, pt to continue current statin lovastatin 40  Hypertension Ok to change bystolic to toprol ER 50 due to insurance, cont to follow BP athome and next visit  Impaired glucose tolerance Lab Results  Component Value  Date   HGBA1C 5.8 10/04/2020   Stable, pt to continue current medical treatment  - diet   Vitamin D deficiency Last vitamin D Lab Results  Component Value Date   VD25OH 20.16 (L) 10/04/2020   Low to start oral  replacement  Followup: Return in about 6 months (around 04/03/2021).  Cathlean Cower, MD 10/11/2020 9:29 PM [facility]

## 2020-10-04 NOTE — Patient Instructions (Addendum)
Ok to change the bystolic to toprol XL 50 mg - 1 per day after the bystolic runs out  Please take all new medication as prescribed - the tramadol for pain  Please continue all other medications as before, and refills have been done if requested.  Please have the pharmacy call with any other refills you may need.  Please continue your efforts at being more active, low cholesterol diet, and weight control.  You are otherwise up to date with prevention measures today.  Please keep your appointments with your specialists as you may have planned  You will be contacted regarding the referral for: colonoscopy  Please go to the LAB at the blood drawing area for the tests to be done  You will be contacted by phone if any changes need to be made immediately.  Otherwise, you will receive a letter about your results with an explanation, but please check with MyChart first.  Please remember to sign up for MyChart if you have not done so, as this will be important to you in the future with finding out test results, communicating by private email, and scheduling acute appointments online when needed.  Please make an Appointment to return in 6 months, or sooner if needed

## 2020-10-06 LAB — PTH, INTACT AND CALCIUM
Calcium: 9.2 mg/dL (ref 8.6–10.3)
PTH: 215 pg/mL — ABNORMAL HIGH (ref 14–64)

## 2020-10-11 ENCOUNTER — Encounter: Payer: Self-pay | Admitting: Internal Medicine

## 2020-10-11 NOTE — Assessment & Plan Note (Signed)
Ok to change bystolic to toprol ER 50 due to insurance, cont to follow BP athome and next visit

## 2020-10-11 NOTE — Assessment & Plan Note (Signed)
Lab Results  °Component Value Date  ° HGBA1C 5.8 10/04/2020  ° °Stable, pt to continue current medical treatment  - diet ° °

## 2020-10-11 NOTE — Assessment & Plan Note (Signed)

## 2020-10-11 NOTE — Assessment & Plan Note (Signed)
Lab Results  Component Value Date   CREATININE 2.52 (H) 10/04/2020   Stable overall, cont to avoid nephrotoxins

## 2020-10-11 NOTE — Assessment & Plan Note (Signed)
Last vitamin D Lab Results  Component Value Date   VD25OH 20.16 (L) 10/04/2020   Low to start oral replacement

## 2020-10-11 NOTE — Assessment & Plan Note (Signed)
Lab Results  Component Value Date   LDLCALC 108 (H) 10/04/2020   Stable, pt to continue current statin lovastatin 40

## 2020-10-11 NOTE — Assessment & Plan Note (Signed)
Lab Results  Component Value Date   VITAMINB12 205 (L) 10/04/2020   Slow, to start oral replacement - b12 1000 mcg qd

## 2020-10-11 NOTE — Assessment & Plan Note (Signed)
To continue lovastatin and lower chol diet

## 2020-10-19 ENCOUNTER — Telehealth: Payer: Self-pay | Admitting: Internal Medicine

## 2020-10-19 NOTE — Telephone Encounter (Signed)
Patient is having severe allergies(sinus congestion and drainage) and is requesting to have something called in. According to the patient he has a history of this and is normally prescribed a Z-pack and prednisolone.   Please advise.   Preferred Pharmacy:   [facility] [place] [place], [place] - [address] AT [address] Phone:  [phone]  Fax:  [phone]

## 2020-10-20 ENCOUNTER — Ambulatory Visit: Payer: Medicare Other | Admitting: Internal Medicine

## 2020-10-20 ENCOUNTER — Other Ambulatory Visit: Payer: Self-pay

## 2020-10-20 NOTE — Telephone Encounter (Signed)
Pls sch OV w/any provider Thx

## 2020-10-24 ENCOUNTER — Encounter: Payer: Self-pay | Admitting: Gastroenterology

## 2020-10-26 ENCOUNTER — Encounter: Payer: Self-pay | Admitting: Internal Medicine

## 2020-10-26 ENCOUNTER — Ambulatory Visit (INDEPENDENT_AMBULATORY_CARE_PROVIDER_SITE_OTHER): Payer: Medicare Other | Admitting: Internal Medicine

## 2020-10-26 ENCOUNTER — Other Ambulatory Visit: Payer: Self-pay

## 2020-10-26 ENCOUNTER — Ambulatory Visit: Payer: Medicare Other | Admitting: Internal Medicine

## 2020-10-26 ENCOUNTER — Other Ambulatory Visit: Payer: Self-pay | Admitting: Internal Medicine

## 2020-10-26 VITALS — BP 124/78 | HR 55 | Ht 69.0 in | Wt 282.0 lb

## 2020-10-26 DIAGNOSIS — J309 Allergic rhinitis, unspecified: Secondary | ICD-10-CM

## 2020-10-26 DIAGNOSIS — E7800 Pure hypercholesterolemia, unspecified: Secondary | ICD-10-CM

## 2020-10-26 DIAGNOSIS — T7840XA Allergy, unspecified, initial encounter: Secondary | ICD-10-CM

## 2020-10-26 DIAGNOSIS — E538 Deficiency of other specified B group vitamins: Secondary | ICD-10-CM

## 2020-10-26 DIAGNOSIS — N179 Acute kidney failure, unspecified: Secondary | ICD-10-CM | POA: Diagnosis not present

## 2020-10-26 DIAGNOSIS — E559 Vitamin D deficiency, unspecified: Secondary | ICD-10-CM

## 2020-10-26 DIAGNOSIS — N184 Chronic kidney disease, stage 4 (severe): Secondary | ICD-10-CM

## 2020-10-26 DIAGNOSIS — N1831 Chronic kidney disease, stage 3a: Secondary | ICD-10-CM | POA: Diagnosis not present

## 2020-10-26 DIAGNOSIS — I10 Essential (primary) hypertension: Secondary | ICD-10-CM

## 2020-10-26 DIAGNOSIS — R7989 Other specified abnormal findings of blood chemistry: Secondary | ICD-10-CM

## 2020-10-26 DIAGNOSIS — M48061 Spinal stenosis, lumbar region without neurogenic claudication: Secondary | ICD-10-CM

## 2020-10-26 LAB — BASIC METABOLIC PANEL
BUN: 35 mg/dL — ABNORMAL HIGH (ref 6–23)
CO2: 31 mEq/L (ref 19–32)
Calcium: 9.1 mg/dL (ref 8.4–10.5)
Chloride: 106 mEq/L (ref 96–112)
Creatinine, Ser: 2.51 mg/dL — ABNORMAL HIGH (ref 0.40–1.50)
GFR: 24.61 mL/min — ABNORMAL LOW (ref >60.00)
Glucose, Bld: 85 mg/dL (ref 70–99)
Potassium: 4.6 mEq/L (ref 3.5–5.1)
Sodium: 142 mEq/L (ref 135–145)

## 2020-10-26 MED ORDER — CETIRIZINE HCL 10 MG PO TABS: 10.0000 mg | ORAL_TABLET | Freq: Every day | ORAL | 11 refills | Status: DC

## 2020-10-26 MED ORDER — TRAMADOL HCL 50 MG PO TABS: 50.0000 mg | ORAL_TABLET | Freq: Four times a day (QID) | ORAL | 2 refills | Status: DC | PRN

## 2020-10-26 MED ORDER — ATORVASTATIN CALCIUM 80 MG PO TABS: 80.0000 mg | ORAL_TABLET | Freq: Every day | ORAL | 3 refills | Status: DC

## 2020-10-26 MED ORDER — METHYLPREDNISOLONE ACETATE 80 MG/ML IJ SUSP
80.0000 mg | Freq: Once | INTRAMUSCULAR | Status: AC
  Administered 2020-10-26: 80 mg via INTRAMUSCULAR

## 2020-10-26 MED ORDER — PREDNISONE 10 MG PO TABS: ORAL_TABLET | ORAL | 0 refills | Status: DC

## 2020-10-26 NOTE — Patient Instructions (Signed)
Ok to continue to finish the bystolic you have , then change to the toprol XL as prescribed last visit  Please take all new medication as prescribed - the tramadol for pain as needed (but only take this if you really need it)  Please take OTC Vitamin D3 at 2000 units per day, indefinitely, as well as take Vitamin B12 1000 mcg per day  Your PTH (parathyroid hormone) level was very high, but we need to fix the Vitamin D first, before you can say anything else about this; if it is still high later, you may need to see Endocrinology  You had the steroid shot today  Please take all new medication as prescribed - the prednisone for a short while, and zyrtec more for the long term  You can also take OTC Nasacort for allergies as well, as needed  Ok to increase the Lipitor to 80 mg per day  Please go to the LAB at the blood drawing area for the tests to be done - just the kidney tests today  Please continue all other medications as before, and refills have been done if requested.  Please have the pharmacy call with any other refills you may need.  Please continue your efforts at being more active, low cholesterol diet, and weight control.  Please keep your appointments with your specialists as you may have planned - the kidney doctors next month as planned

## 2020-10-26 NOTE — Progress Notes (Signed)
Patient ID: Troy Palmer, male   DOB: 1945-10-07, 75 y.o.   MRN: [id number]        Chief Complaint: follow up HTN, , AKI on CKD, chronic pain, low vit d, low b12, nasal allergy symptoms, elvated PTH, and HLD       HPI:  Troy Palmer is a 75 y.o. male here to f/u visit 1 wk ago with surprise findings of AKI on CKD possibly due to overdiuresis - lasix was held x 4 days now here for recheck; has not yet change the bystolic to toprol as he is trying to finish current bottle bystolic, but does plan to change over soon.  Tramadol working well for pain.  Not taking Vit D or B12. Does have several wks ongoing nasal allergy symptoms with clearish congestion, itch and sneezing, without fever, pain, ST, cough, swelling or wheezing.  Has good compliance with current meds.  Pt denies chest pain, increased sob or doe, wheezing, orthopnea, PND, increased LE swelling, palpitations, dizziness or syncope.   Pt denies polydipsia, polyuria, Has no new focal neuro s/s  Pt denies fever, wt loss, night sweats, loss of appetite, or other constitutional symptoms  No other new complaints.          Wt Readings from Last 3 Encounters:  10/26/20 282 lb (127.9 kg)  10/04/20 283 lb 9.6 oz (128.6 kg)  05/10/20 283 lb 12.8 oz (128.7 kg)   BP Readings from Last 3 Encounters:  10/26/20 124/78  10/04/20 126/80  06/01/20 129/70         Past Medical History:  Diagnosis Date  . ALLERGIC RHINITIS 04/09/2007  . ASTHMA 12/04/2007  . BACK PAIN 09/16/2009  . BENIGN PROSTATIC HYPERTROPHY 04/09/2007  . CEREBROVASCULAR ACCIDENT, HX OF 07/17/2010  . COLONIC POLYPS, HX OF 12/04/2007  . CONSTIPATION 09/25/2010  . Degeneration of cervical intervertebral disc 03/28/2007  . DEPRESSION 12/04/2007  . DIVERTICULOSIS, COLON 12/04/2007  . DYSPHAGIA UNSPECIFIED 03/16/2008  . ERECTILE DYSFUNCTION 04/09/2007  . ESOPHAGEAL STRICTURE 05/26/2008  . GERD 12/04/2007  . HEMORRHOIDS, RECURRENT 02/11/2008  . HYPERLIPIDEMIA 12/04/2007  . HYPERTENSION  03/28/2007  . LEG PAIN, BILATERAL 01/25/2010  . LOW BACK PAIN 04/09/2007  . LUNG NODULE 12/04/2007  . Osteoarth NOS-Unspec 03/28/2007  . Other dysphagia 11/21/2009  . Peripheral neuropathy 05/29/2018  . POLYARTHRALGIA 07/13/2008  . Polymyalgia rheumatica ([place]) 07/26/2008  . PVD WITH CLAUDICATION 02/10/2010  . RENAL INSUFFICIENCY 03/15/2010  . SLEEP APNEA, OBSTRUCTIVE, MODERATE 04/09/2007  . TB SKIN TEST, POSITIVE 03/28/2007   Past Surgical History:  Procedure Laterality Date  . ROTATOR CUFF REPAIR     Left  . TOTAL KNEE ARTHROPLASTY     x 2    reports that he has never smoked. He has never used smokeless tobacco. He reports that he does not drink alcohol and does not use drugs. family history includes Asthma in an other family member; Healthy in his mother; Heart disease in his father; Hypertension in his brother; Lupus in his brother. Allergies  Allergen Reactions  . Ace Inhibitors Swelling    Angioedema throat  . Augmentin [Amoxicillin-Pot Clavulanate] Other (See Comments)    Marked weakness with po med x 3 doses  . Doxycycline     Some GI upset  . Sulfa Antibiotics Hives    And facial angioedema   Current Outpatient Medications on File Prior to Visit  Medication Sig Dispense Refill  . acetaminophen (TYLENOL) 650 MG CR tablet Take 650 mg by mouth every 6 (six)  hours as needed (pain/headache).    Marland Kitchen allopurinol (ZYLOPRIM) 100 MG tablet TAKE 1 TABLET BY MOUTH ONCE DAILY 90 tablet 1  . amLODipine (NORVASC) 5 MG tablet TAKE 1 TABLET(5 MG) BY MOUTH DAILY 30 tablet 5  . colchicine 0.6 MG tablet Take 1 tablet (0.6 mg total) by mouth daily. For gout pain 30 tablet 1  . cyclobenzaprine (FLEXERIL) 5 MG tablet Take 1 tablet (5 mg total) by mouth 3 (three) times daily as needed for muscle spasms. 40 tablet 2  . furosemide (LASIX) 40 MG tablet TAKE 1 AND 1/2 TABLETS BY MOUTH TWICE DAILY 270 tablet 1  . hydrALAZINE (APRESOLINE) 25 MG tablet Take 1 tablet (25 mg total) by mouth in the morning and at  bedtime. 180 tablet 3  . levocetirizine (XYZAL) 5 MG tablet Take 1 tablet (5 mg total) by mouth every evening. 30 tablet 11  . lidocaine (LIDODERM) 5 % Place 1 patch onto the skin daily. Apply patch to the left shoulder area. Remove & Discard patch within 12 hours or as directed by MD 14 patch 0  . metoprolol succinate (TOPROL-XL) 50 MG 24 hr tablet Take 1 tablet (50 mg total) by mouth daily. Take with or immediately following a meal. 90 tablet 3  . pantoprazole (PROTONIX) 40 MG tablet Take 1 tablet (40 mg total) by mouth daily. 90 tablet 3  . tamsulosin (FLOMAX) 0.4 MG CAPS capsule TAKE 1 CAPSULE(0.4 MG) BY MOUTH TWICE DAILY 180 capsule 3  . vitamin B-12 (CYANOCOBALAMIN) 1000 MCG tablet Take 1 tablet (1,000 mcg total) by mouth daily. 90 tablet 1  . PFIZER-BIONTECH COVID-19 VACC 30 MCG/0.3ML injection      No current facility-administered medications on file prior to visit.        ROS:  All others reviewed and negative.  Objective        PE:  BP 124/78   Pulse (!) 55   Ht '5\' 9"'$  (1.753 m)   Wt 282 lb (127.9 kg)   SpO2 98%   BMI 41.64 kg/m                 Constitutional: Pt appears in NAD               HENT: Head: NCAT.                Right Ear: External ear normal.                 Left Ear: External ear normal.                Eyes: . Pupils are equal, round, and reactive to light. Conjunctivae and EOM are normal               Nose: without d/c or deformity               Neck: Neck supple. Gross normal ROM               Cardiovascular: Normal rate and regular rhythm.                 Pulmonary/Chest: Effort normal and breath sounds without rales or wheezing.                Abd:  Soft, NT, ND, + BS, no organomegaly               Neurological: Pt is alert. At baseline orientation, motor grossly intact  Skin: Skin is warm. No rashes, no other new lesions, LE edema - trace right > left ankle edema                Psychiatric: Pt behavior is normal without agitation   Micro:  none  Cardiac tracings I have personally interpreted today:  none  Pertinent Radiological findings (summarize): none   Lab Results  Component Value Date   WBC 4.8 10/04/2020   HGB 12.5 (L) 10/04/2020   HCT 38.8 (L) 10/04/2020   PLT 228.0 10/04/2020   GLUCOSE 85 10/26/2020   CHOL 175 10/04/2020   TRIG 87.0 10/04/2020   HDL 49.80 10/04/2020   LDLDIRECT 156.9 10/10/2010   LDLCALC 108 (H) 10/04/2020   ALT 8 10/04/2020   AST 13 10/04/2020   NA 142 10/26/2020   K 4.6 10/26/2020   CL 106 10/26/2020   CREATININE 2.51 (H) 10/26/2020   BUN 35 (H) 10/26/2020   CO2 31 10/26/2020   TSH 2.02 10/04/2020   PSA 1.30 10/04/2020   HGBA1C 5.8 10/04/2020   Assessment/Plan:  Troy Palmer is a 75 y.o. Black or African American [2] male with  has a past medical history of ALLERGIC RHINITIS (04/09/2007), ASTHMA (12/04/2007), BACK PAIN (09/16/2009), BENIGN PROSTATIC HYPERTROPHY (04/09/2007), CEREBROVASCULAR ACCIDENT, HX OF (07/17/2010), COLONIC POLYPS, HX OF (12/04/2007), CONSTIPATION (09/25/2010), Degeneration of cervical intervertebral disc (03/28/2007), DEPRESSION (12/04/2007), DIVERTICULOSIS, COLON (12/04/2007), DYSPHAGIA UNSPECIFIED (03/16/2008), ERECTILE DYSFUNCTION (04/09/2007), ESOPHAGEAL STRICTURE (05/26/2008), GERD (12/04/2007), HEMORRHOIDS, RECURRENT (02/11/2008), HYPERLIPIDEMIA (12/04/2007), HYPERTENSION (03/28/2007), LEG PAIN, BILATERAL (01/25/2010), LOW BACK PAIN (04/09/2007), LUNG NODULE (12/04/2007), Osteoarth NOS-Unspec (03/28/2007), Other dysphagia (11/21/2009), Peripheral neuropathy (05/29/2018), POLYARTHRALGIA (07/13/2008), Polymyalgia rheumatica ([place]) (07/26/2008), PVD WITH CLAUDICATION (02/10/2010), RENAL INSUFFICIENCY (03/15/2010), SLEEP APNEA, OBSTRUCTIVE, MODERATE (04/09/2007), and TB SKIN TEST, POSITIVE (03/28/2007).  Spinal stenosis of lumbar region at multiple levels Stable, to continue tramadol prn  Allergic rhinitis Mod worsening, declines nasacort, but for depomedrol IM 80, predpac, and zyrtec  B12  deficiency Lab Results  Component Value Date   VITAMINB12 205 (L) 10/04/2020   Low, to start oral replacement - b12 1000 mcg qd  Chronic kidney disease, stage 3 ([facility]) With recent possible AKI on CKD vs ckd worsening - for f/u lab, consider renal referral  Elevated PTHrP level Will need to normalize vit d, then recheck  Lab Results  Component Value Date   PTH 215 (H) 10/04/2020   CALCIUM 9.1 10/26/2020   CAION 1.20 05/18/2012   PHOS 2.8 10/04/2020     Essential hypertension Stable, to finish bystolic then change to toprol for insurance coverage purpose  Hypercholesterolemia Uncontrolled, to increase lipitor 80 Lab Results  Component Value Date   CHOL 175 10/04/2020   HDL 49.80 10/04/2020   LDLCALC 108 (H) 10/04/2020   LDLDIRECT 156.9 10/10/2010   TRIG 87.0 10/04/2020   CHOLHDL 4 10/04/2020     Vitamin D deficiency . Last vitamin D Lab Results  Component Value Date   VD25OH 20.16 (L) 10/04/2020   Low, to start oral replacement D3 at 2000 u qd   Followup: Return if symptoms worsen or fail to improve.  Cathlean Cower, MD 10/31/2020 1:18 AM [facility]

## 2020-10-31 ENCOUNTER — Encounter: Payer: Self-pay | Admitting: Internal Medicine

## 2020-10-31 ENCOUNTER — Other Ambulatory Visit: Payer: Self-pay | Admitting: Internal Medicine

## 2020-10-31 DIAGNOSIS — E538 Deficiency of other specified B group vitamins: Secondary | ICD-10-CM

## 2020-10-31 DIAGNOSIS — R7989 Other specified abnormal findings of blood chemistry: Secondary | ICD-10-CM | POA: Insufficient documentation

## 2020-10-31 DIAGNOSIS — J309 Allergic rhinitis, unspecified: Secondary | ICD-10-CM | POA: Insufficient documentation

## 2020-10-31 DIAGNOSIS — R7302 Impaired glucose tolerance (oral): Secondary | ICD-10-CM

## 2020-10-31 DIAGNOSIS — E559 Vitamin D deficiency, unspecified: Secondary | ICD-10-CM

## 2020-10-31 DIAGNOSIS — E782 Mixed hyperlipidemia: Secondary | ICD-10-CM

## 2020-10-31 DIAGNOSIS — N1831 Chronic kidney disease, stage 3a: Secondary | ICD-10-CM

## 2020-10-31 NOTE — Assessment & Plan Note (Signed)
With recent possible AKI on CKD vs ckd worsening - for f/u lab, consider renal referral

## 2020-10-31 NOTE — Assessment & Plan Note (Signed)
Mod worsening, declines nasacort, but for depomedrol IM 80, predpac, and zyrtec

## 2020-10-31 NOTE — Assessment & Plan Note (Signed)
.   Last vitamin D Lab Results  Component Value Date   VD25OH 20.16 (L) 10/04/2020   Low, to start oral replacement D3 at 2000 u qd

## 2020-10-31 NOTE — Assessment & Plan Note (Signed)
Will need to normalize vit d, then recheck  Lab Results  Component Value Date   PTH 215 (H) 10/04/2020   CALCIUM 9.1 10/26/2020   CAION 1.20 05/18/2012   PHOS 2.8 10/04/2020

## 2020-10-31 NOTE — Assessment & Plan Note (Signed)
Stable, to continue tramadol prn

## 2020-10-31 NOTE — Assessment & Plan Note (Signed)
Stable, to finish bystolic then change to toprol for insurance coverage purpose

## 2020-10-31 NOTE — Assessment & Plan Note (Signed)
Lab Results  Component Value Date   VITAMINB12 205 (L) 10/04/2020   Low, to start oral replacement - b12 1000 mcg qd

## 2020-10-31 NOTE — Assessment & Plan Note (Signed)
Uncontrolled, to increase lipitor 80 Lab Results  Component Value Date   CHOL 175 10/04/2020   HDL 49.80 10/04/2020   LDLCALC 108 (H) 10/04/2020   LDLDIRECT 156.9 10/10/2010   TRIG 87.0 10/04/2020   CHOLHDL 4 10/04/2020

## 2020-11-08 ENCOUNTER — Ambulatory Visit: Payer: Medicare Other | Admitting: Internal Medicine

## 2020-11-09 ENCOUNTER — Ambulatory Visit: Payer: Medicare Other | Admitting: Internal Medicine

## 2020-11-16 DIAGNOSIS — E669 Obesity, unspecified: Secondary | ICD-10-CM | POA: Diagnosis not present

## 2020-11-16 DIAGNOSIS — N183 Chronic kidney disease, stage 3 unspecified: Secondary | ICD-10-CM | POA: Diagnosis not present

## 2020-11-16 DIAGNOSIS — M109 Gout, unspecified: Secondary | ICD-10-CM | POA: Diagnosis not present

## 2020-11-16 DIAGNOSIS — I129 Hypertensive chronic kidney disease with stage 1 through stage 4 chronic kidney disease, or unspecified chronic kidney disease: Secondary | ICD-10-CM | POA: Diagnosis not present

## 2020-11-22 ENCOUNTER — Other Ambulatory Visit: Payer: Self-pay | Admitting: Internal Medicine

## 2020-11-23 ENCOUNTER — Other Ambulatory Visit: Payer: Self-pay

## 2020-11-23 MED ORDER — PANTOPRAZOLE SODIUM 40 MG PO TBEC: 40.0000 mg | DELAYED_RELEASE_TABLET | Freq: Every day | ORAL | 3 refills | Status: DC

## 2020-12-22 ENCOUNTER — Telehealth: Payer: Self-pay | Admitting: Internal Medicine

## 2020-12-22 MED ORDER — METOPROLOL SUCCINATE ER 50 MG PO TB24: 50.0000 mg | ORAL_TABLET | Freq: Every day | ORAL | 3 refills | Status: DC

## 2020-12-22 NOTE — Telephone Encounter (Signed)
    Patient calling to report he received a letter in the mail form his insurance stating Bystolic (not on med list) was denied.   Please call

## 2020-12-22 NOTE — Telephone Encounter (Signed)
Ok to tell pt its a good think we already changed the bystolic to toprol xl in feb 2022    I sent erx again today

## 2020-12-22 NOTE — Telephone Encounter (Signed)
Patient contacted and voicemail left to return the office call in regards to Dr Jenny Reichmann last message.

## 2020-12-27 ENCOUNTER — Ambulatory Visit (AMBULATORY_SURGERY_CENTER): Payer: Medicare Other | Admitting: *Deleted

## 2020-12-27 ENCOUNTER — Other Ambulatory Visit: Payer: Self-pay

## 2020-12-27 VITALS — Ht 69.0 in | Wt 270.0 lb

## 2020-12-27 DIAGNOSIS — Z1211 Encounter for screening for malignant neoplasm of colon: Secondary | ICD-10-CM

## 2020-12-27 MED ORDER — PEG 3350-KCL-NA BICARB-NACL 420 G PO SOLR: 4000.0000 mL | Freq: Once | ORAL | 0 refills | Status: AC

## 2020-12-27 NOTE — Progress Notes (Signed)
No egg or soy allergy known to patient  No issues with past sedation with any surgeries or procedures Patient denies ever being told they had issues or difficulty with intubation  No FH of Malignant Hyperthermia No diet pills per patient No home 02 use per patient  No blood thinners per patient  Pt states  issues with constipation - uses Laxative and it helps then gets constipated again - Pt states he has a BM every 2-3 days , varies between hard and soft with laxative ( miralax )  No A fib or A flutter  EMMI video to pt or via [place] 19 guidelines implemented in PV today with Pt and RN  Pt is fully vaccinated  for Covid  Pt states he has lost his appetite - drinking less- Pt denies n/v, no rectal bleeding , no fever- states has increased mucus - no cough   Due to the COVID-19 pandemic we are asking patients to follow certain guidelines.  Pt aware of COVID protocols and [facility] guidelines  Pt verified name, DOB, address and insurance during PV today. Pt mailed instruction packet to included paper to complete and mail back to [facility] with addressed and stamped envelope, Emmi video, copy of consent form to read and not return, and instructions. *. PV completed over the phone. Pt encouraged to call with questions or issues. My Chart instructions to pt as well

## 2021-01-06 ENCOUNTER — Ambulatory Visit: Payer: Medicare Other | Admitting: Internal Medicine

## 2021-01-10 ENCOUNTER — Encounter: Payer: Medicare Other | Admitting: Gastroenterology

## 2021-01-12 ENCOUNTER — Telehealth: Payer: Self-pay | Admitting: Internal Medicine

## 2021-01-12 MED ORDER — AMLODIPINE BESYLATE 5 MG PO TABS: 5.0000 mg | ORAL_TABLET | Freq: Every day | ORAL | 0 refills | Status: DC

## 2021-01-12 NOTE — Telephone Encounter (Signed)
1.Medication Requested: amLODipine (NORVASC) 5 MG tablet    2. Pharmacy (Name, [facility]): [facility], [place] - [address]  3. On Med List:  Yes   4. Last Visit with PCP: 10-26-20  5. Next visit date with PCP: 04-03-21   Agent: Please be advised that RX refills may take up to 3 business days. We ask that you follow-up with your pharmacy.

## 2021-02-20 ENCOUNTER — Other Ambulatory Visit: Payer: Self-pay | Admitting: Internal Medicine

## 2021-02-21 ENCOUNTER — Other Ambulatory Visit: Payer: Self-pay

## 2021-03-01 ENCOUNTER — Other Ambulatory Visit: Payer: Self-pay

## 2021-03-01 ENCOUNTER — Emergency Department (HOSPITAL_COMMUNITY)
Admission: EM | Admit: 2021-03-01 | Discharge: 2021-03-02 | Disposition: A | Discharge status: Home / Self Care | Payer: Medicare Other | Attending: Emergency Medicine | Admitting: Emergency Medicine

## 2021-03-01 ENCOUNTER — Encounter (HOSPITAL_COMMUNITY): Payer: Self-pay | Admitting: Emergency Medicine

## 2021-03-01 ENCOUNTER — Emergency Department (HOSPITAL_COMMUNITY): Payer: Medicare Other

## 2021-03-01 DIAGNOSIS — Z96653 Presence of artificial knee joint, bilateral: Secondary | ICD-10-CM | POA: Insufficient documentation

## 2021-03-01 DIAGNOSIS — S299XXA Unspecified injury of thorax, initial encounter: Secondary | ICD-10-CM | POA: Diagnosis present

## 2021-03-01 DIAGNOSIS — I509 Heart failure, unspecified: Secondary | ICD-10-CM | POA: Diagnosis not present

## 2021-03-01 DIAGNOSIS — S29012A Strain of muscle and tendon of back wall of thorax, initial encounter: Secondary | ICD-10-CM | POA: Diagnosis not present

## 2021-03-01 DIAGNOSIS — I130 Hypertensive heart and chronic kidney disease with heart failure and stage 1 through stage 4 chronic kidney disease, or unspecified chronic kidney disease: Secondary | ICD-10-CM | POA: Diagnosis not present

## 2021-03-01 DIAGNOSIS — Z87891 Personal history of nicotine dependence: Secondary | ICD-10-CM | POA: Diagnosis not present

## 2021-03-01 DIAGNOSIS — M546 Pain in thoracic spine: Secondary | ICD-10-CM | POA: Diagnosis not present

## 2021-03-01 DIAGNOSIS — S39012A Strain of muscle, fascia and tendon of lower back, initial encounter: Secondary | ICD-10-CM | POA: Diagnosis not present

## 2021-03-01 DIAGNOSIS — Z79899 Other long term (current) drug therapy: Secondary | ICD-10-CM | POA: Diagnosis not present

## 2021-03-01 DIAGNOSIS — N183 Chronic kidney disease, stage 3 unspecified: Secondary | ICD-10-CM | POA: Diagnosis not present

## 2021-03-01 DIAGNOSIS — X58XXXA Exposure to other specified factors, initial encounter: Secondary | ICD-10-CM | POA: Insufficient documentation

## 2021-03-01 DIAGNOSIS — M549 Dorsalgia, unspecified: Secondary | ICD-10-CM | POA: Diagnosis not present

## 2021-03-01 DIAGNOSIS — J45991 Cough variant asthma: Secondary | ICD-10-CM | POA: Diagnosis not present

## 2021-03-01 NOTE — ED Triage Notes (Signed)
Pt reports mid/lumbar spinal back pain that started about 3 weeks ago that gets worse with bending over/movement. Pt denies any trauma. Denies any urinary sx. Pt reports that he was told "my back is split" in 2012. Pt ambulatory in triage. No hx of back surgeries.

## 2021-03-01 NOTE — ED Provider Notes (Signed)
Emergency Medicine Provider Triage Evaluation Note  Troy Palmer , a 75 y.o. male  was evaluated in triage.  Pt complains of right mid back pain for the past 2 to 3 weeks.  States he is worried primarily about a lung issue.  Review of Systems  Positive: Right upper back pain Negative: Fever, trauma, shortness of breath, chest pain, urinary symptoms  Physical Exam  BP (!) 179/69 (BP Location: Left Arm)   Pulse 62   Temp 98.3 F (36.8 C) (Oral)   Resp 19   Ht 5' 9.5" (1.765 m)   Wt 122.5 kg   SpO2 93%   BMI 39.30 kg/m  Gen:   Awake, no distress   Resp:  Normal effort  MSK:   Moves extremities without difficulty  Other:  Tenderness to the right posterior ribs of the thoracic spine without noted swelling, deformity, skin lesions  Medical Decision Making  Medically screening exam initiated at 10:01 PM.  Appropriate orders placed.  Troy Palmer was informed that the remainder of the evaluation will be completed by another provider, this initial triage assessment does not replace that evaluation, and the importance of remaining in the ED until their evaluation is complete.     Layla Maw 03/01/21 2232    Drenda Freeze, MD 03/01/21 2258

## 2021-03-02 MED ORDER — TIZANIDINE HCL 2 MG PO TABS: 2.0000 mg | ORAL_TABLET | Freq: Three times a day (TID) | ORAL | 0 refills | Status: DC | PRN

## 2021-03-02 MED ORDER — ACETAMINOPHEN ER 650 MG PO TBCR: 650.0000 mg | EXTENDED_RELEASE_TABLET | Freq: Four times a day (QID) | ORAL | 0 refills | Status: AC | PRN

## 2021-03-02 NOTE — Discharge Instructions (Addendum)
Your back pain may be due to a muscle strain.  Consider taking muscle relaxant and Tylenol as needed for pain.  Follow-up with your doctor for further care.  If you develop fever, productive cough, shortness of breath, or rash, please return to the ER for further evaluation.

## 2021-03-02 NOTE — ED Notes (Signed)
Patient states he doesn't need vitals done anymore.

## 2021-03-02 NOTE — ED Provider Notes (Signed)
Troy Palmer Provider Note   CSN: [id number] Arrival date & time: 03/01/21  1847     History Chief Complaint  Patient presents with   Back Pain    Troy Palmer is a 75 y.o. male.  The history is provided by the patient and medical records. No language interpreter was used.  Back Pain  75 year old male significant history of asthma, chronic back pain, hypertension, polyarthralgia, polymyalgia rheumatica who presents valuation of back pain.  Patient report he has had persistent pain primarily to his right mid to upper back ongoing for the past 2 to 3 weeks.  Pain is described as a sharp cramp sensation with occasional muscle spasm of moderate intensity.  Noticed pain more when he lays a certain way while watching TV.  He was concerned that it could be due to his lung however he denies having any fever or chills no productive cough no shortness of breath or chest pain no abdominal pain no rash.  He did try taking tramadol on occasion but states that it makes his throat dry so he stopped.  Due to worsening pain patient decided to come for further evaluation.  Past Medical History:  Diagnosis Date   ALLERGIC RHINITIS 04/09/2007   Allergy    ASTHMA 12/04/2007   BACK PAIN 09/16/2009   BENIGN PROSTATIC HYPERTROPHY 04/09/2007   CEREBROVASCULAR ACCIDENT, HX OF 07/17/2010   COLONIC POLYPS, HX OF 12/04/2007   CONSTIPATION 09/25/2010   Degeneration of cervical intervertebral disc 03/28/2007   DEPRESSION 12/04/2007   DIVERTICULOSIS, COLON 12/04/2007   DYSPHAGIA UNSPECIFIED 03/16/2008   ERECTILE DYSFUNCTION 04/09/2007   ESOPHAGEAL STRICTURE 05/26/2008   GERD 12/04/2007   HEMORRHOIDS, RECURRENT 02/11/2008   HYPERLIPIDEMIA 12/04/2007   HYPERTENSION 03/28/2007   LEG PAIN, BILATERAL 01/25/2010   LOW BACK PAIN 04/09/2007   LUNG NODULE 12/04/2007   Osteoarth NOS-Unspec 03/28/2007   Other dysphagia 11/21/2009   Peripheral neuropathy 05/29/2018   POLYARTHRALGIA 07/13/2008    Polymyalgia rheumatica ([place]) 07/26/2008   PVD WITH CLAUDICATION 02/10/2010   RENAL INSUFFICIENCY 03/15/2010   Sleep apnea    no cpap    SLEEP APNEA, OBSTRUCTIVE, MODERATE 04/09/2007   Stroke ([place])    TIA    TB SKIN TEST, POSITIVE 03/28/2007    Patient Active Problem List   Diagnosis Date Noted   Allergic rhinitis 10/31/2020   Elevated PTHrP level 10/31/2020   Aortic atherosclerosis ([place]) 05/11/2020   Dysuria 03/09/2020   Constipation 03/09/2020   Vitamin D deficiency 12/05/2019   B12 deficiency 12/05/2019   Contact dermatitis 09/08/2019   Left knee pain 06/04/2019   Neck pain 02/23/2019   Recurrent falls 08/19/2018   Abdominal pain 07/25/2018   Peripheral neuropathy 05/29/2018   Paresthesia of bilateral legs 05/05/2018   Cough variant asthma  vs UACS assoc with pnds 02/27/2018   Asthmatic bronchitis 02/10/2018   Acute respiratory failure with hypoxia ([place]) 02/09/2018   Pain, dental 02/03/2018   Cough 02/03/2018   Wheezing 02/03/2018   Pain and swelling of lower leg, right 12/31/2017   BPH with obstruction/lower urinary tract symptoms 05/16/2017   Bilateral ankle pain 05/16/2017   DOE (dyspnea on exertion) 11/16/2016   Complete tear of right rotator cuff 06/26/2016   Cellulitis of deltoid region 06/26/2016   Gout 08/18/2015   Congestive heart failure (CHF) ([place]) 08/18/2015   Spinal stenosis of lumbar region at multiple levels 08/18/2015   Greater trochanteric bursitis of right hip 05/05/2014   Right hip pain 05/04/2014  Posterior neck pain 12/22/2013   Orchitis, right 12/22/2013   Paresthesia of both feet 12/22/2013   Acute pain of left shoulder 09/16/2013   Allergic angioedema 07/31/2012   Impaired glucose tolerance 04/15/2012   Chest pain 02/24/2012   Foot pain, bilateral 02/20/2012   Leg pain, bilateral 01/19/2012   Edema 01/16/2012   Dysphagia 11/21/2011   Gout 06/07/2011   Chronic kidney disease, stage 3 ([place]) 06/07/2011   Encounter for well adult exam with  abnormal findings 02/14/2011   Encounter for long-term (current) use of other medications 11/08/2010   CONSTIPATION 09/25/2010   Memory loss 06/28/2010   PVD (peripheral vascular disease) ([place]) 02/10/2010   LEG PAIN, BILATERAL 01/25/2010   Epigastric pain 11/21/2009   Backache 09/16/2009   Polymyalgia rheumatica ([place]) 07/26/2008   POLYARTHRALGIA 07/13/2008   ESOPHAGEAL STRICTURE 05/26/2008   HEMORRHOIDS, RECURRENT 02/11/2008   Hypercholesterolemia 12/04/2007   DEPRESSION 12/04/2007   Asthma 12/04/2007   LUNG NODULE 12/04/2007   Gastroesophageal reflux disease without esophagitis 12/04/2007   DIVERTICULOSIS, COLON 12/04/2007   COLONIC POLYPS, HX OF 12/04/2007   ERECTILE DYSFUNCTION 04/09/2007   SLEEP APNEA, OBSTRUCTIVE, MODERATE 04/09/2007   Atopic rhinitis 04/09/2007   BENIGN PROSTATIC HYPERTROPHY 04/09/2007   LOW BACK PAIN 04/09/2007   Morbid obesity due to excess calories ([place]) 03/28/2007   Essential hypertension 03/28/2007   Osteoarthritis of multiple joints 03/28/2007   Degeneration of cervical intervertebral disc 03/28/2007   TB SKIN TEST, POSITIVE 03/28/2007    Past Surgical History:  Procedure Laterality Date   COLONOSCOPY     ROTATOR CUFF REPAIR Bilateral    TOTAL KNEE ARTHROPLASTY     x 2       Family History  Problem Relation Age of Onset   Healthy Mother    Heart disease Father    Lupus Brother    Hypertension Brother    Asthma Other    Colon cancer Neg Hx    Colon polyps Neg Hx    Esophageal cancer Neg Hx    Rectal cancer Neg Hx    Stomach cancer Neg Hx     Social History   Tobacco Use   Smoking status: Former   Smokeless tobacco: Never  Scientific laboratory technician Use: Never used  Substance Use Topics   Alcohol use: No   Drug use: No    Home Medications Prior to Admission medications   Medication Sig Start Date End Date Taking? Authorizing Provider  acetaminophen (TYLENOL) 650 MG CR tablet Take 650 mg by mouth every 6 (six) hours as needed  (pain/headache).    [provider]  allopurinol (ZYLOPRIM) 100 MG tablet TAKE 1 TABLET BY MOUTH ONCE DAILY 05/27/20   Biagio Borg, MD  amLODipine (NORVASC) 5 MG tablet Take 1 tablet (5 mg total) by mouth daily. 01/12/21   Biagio Borg, MD  atorvastatin (LIPITOR) 80 MG tablet Take 1 tablet (80 mg total) by mouth daily. 10/26/20   Biagio Borg, MD  BYSTOLIC 20 MG TABS Take 1 tablet by mouth daily. Patient not taking: Reported on 12/27/2020 11/16/20   [provider]  cetirizine (ZYRTEC) 10 MG tablet Take 1 tablet (10 mg total) by mouth daily. 10/26/20 10/26/21  Biagio Borg, MD  colchicine 0.6 MG tablet Take 1 tablet (0.6 mg total) by mouth daily. For gout pain 06/22/19   Gregor Hams, MD  cyclobenzaprine (FLEXERIL) 5 MG tablet Take 1 tablet (5 mg total) by mouth 3 (three) times daily as needed  for muscle spasms. Patient not taking: Reported on 12/27/2020 03/09/20   Biagio Borg, MD  furosemide (LASIX) 40 MG tablet TAKE 1 AND 1/2 TABLETS BY MOUTH TWICE DAILY 09/29/20   Biagio Borg, MD  hydrALAZINE (APRESOLINE) 25 MG tablet Take 1 tablet (25 mg total) by mouth in the morning and at bedtime. Patient not taking: Reported on 12/27/2020 12/03/19   Biagio Borg, MD  levocetirizine Harlow Ohms) 5 MG tablet Take 1 tablet (5 mg total) by mouth every evening. 01/07/20 01/06/21  Biagio Borg, MD  lidocaine (LIDODERM) 5 % Place 1 patch onto the skin daily. Apply patch to the left shoulder area. Remove & Discard patch within 12 hours or as directed by MD Patient not taking: Reported on 12/27/2020 02/17/19   Petrucelli, Aldona Bar R, PA-C  metoprolol succinate (TOPROL-XL) 50 MG 24 hr tablet Take 1 tablet (50 mg total) by mouth daily. Take with or immediately following a meal. 12/22/20   Biagio Borg, MD  pantoprazole (PROTONIX) 40 MG tablet Take 1 tablet (40 mg total) by mouth daily. 11/23/20   Biagio Borg, MD  PFIZER-BIONTECH COVID-19 VACC 30 MCG/0.3ML injection  08/16/20   [provider]   tamsulosin (FLOMAX) 0.4 MG CAPS capsule TAKE 1 CAPSULE(0.4 MG) BY MOUTH TWICE DAILY 02/20/21   Biagio Borg, MD  traMADol (ULTRAM) 50 MG tablet Take 1 tablet (50 mg total) by mouth every 6 (six) hours as needed. 10/26/20   Biagio Borg, MD  vitamin B-12 (CYANOCOBALAMIN) 1000 MCG tablet Take 1 tablet (1,000 mcg total) by mouth daily. 12/03/19   Biagio Borg, MD    Allergies    Ace inhibitors, Augmentin [amoxicillin-pot clavulanate], Doxycycline, and Sulfa antibiotics  Review of Systems   Review of Systems  Musculoskeletal:  Positive for back pain.  All other systems reviewed and are negative.  Physical Exam Updated Vital Signs BP (!) 179/69 (BP Location: Left Arm)   Pulse 62   Temp 98.3 F (36.8 C) (Oral)   Resp 19   Ht 5' 9.5" (1.765 m)   Wt 122.5 kg   SpO2 93%   BMI 39.30 kg/m   Physical Exam Vitals and nursing note reviewed.  Constitutional:      General: He is not in acute distress.    Appearance: He is well-developed. He is obese.  HENT:     Head: Atraumatic.  Eyes:     Conjunctiva/sclera: Conjunctivae normal.  Cardiovascular:     Rate and Rhythm: Normal rate and regular rhythm.     Pulses: Normal pulses.     Heart sounds: Normal heart sounds.  Pulmonary:     Effort: Pulmonary effort is normal.     Breath sounds: Normal breath sounds. No wheezing, rhonchi or rales.  Chest:     Chest wall: No tenderness.  Abdominal:     Palpations: Abdomen is soft.  Musculoskeletal:        General: Tenderness (Tenderness to right thoracic para spinal muscle on palpation without any overlying skin changes.  No significant midline spine tenderness crepitus or step-off.) present.     Cervical back: Neck supple.  Skin:    Findings: No rash.  Neurological:     Mental Status: He is alert.    ED Results / Procedures / Treatments   Labs (all labs ordered are listed, but only abnormal results are displayed) Labs Reviewed  URINALYSIS, ROUTINE W REFLEX MICROSCOPIC     EKG None  Radiology DG Chest 2 View  Result Date: 03/01/2021 CLINICAL DATA:  Right back pain EXAM: CHEST - 2 VIEW COMPARISON:  02/19/2018 FINDINGS: The heart size and mediastinal contours are within normal limits. Both lungs are clear. The visualized skeletal structures are unremarkable. IMPRESSION: No active cardiopulmonary disease. Electronically Signed   By: Fidela Salisbury MD   On: 03/01/2021 22:31   DG Thoracic Spine 2 View  Result Date: 03/01/2021 CLINICAL DATA:  Right back pain EXAM: THORACIC SPINE 2 VIEWS COMPARISON:  None. FINDINGS: Normal thoracic kyphosis. No acute fracture or listhesis of the thoracic spine. There are endplate changes noted throughout the mid and lower thoracic spine in in keeping with changes of mild to moderate degenerative disc disease. Nodular density seen at T8-9 likely represents asymmetric disc osteophytes best appreciated on frontal view at this level. The paraspinal soft tissues are otherwise unremarkable. IMPRESSION: Mild to moderate degenerative disc disease. No acute fracture or listhesis. Electronically Signed   By: Fidela Salisbury MD   On: 03/01/2021 22:33    Procedures Procedures   Medications Ordered in ED Medications - No data to display  ED Course  I have reviewed the triage vital signs and the nursing notes.  Pertinent labs & imaging results that were available during my care of the patient were reviewed by me and considered in my medical decision making (see chart for details).    MDM Rules/Calculators/A&P                           BP (!) 179/69 (BP Location: Left Arm)   Pulse 62   Temp 98.3 F (36.8 C) (Oral)   Resp 19   Ht 5' 9.5" (1.765 m)   Wt 122.5 kg   SpO2 93%   BMI 39.30 kg/m   Final Clinical Impression(s) / ED Diagnoses Final diagnoses:  Strain of mid-back, initial encounter    Rx / DC Orders ED Discharge Orders          Ordered    acetaminophen (TYLENOL) 650 MG CR tablet  Every 6 hours PRN        03/02/21  1214    tiZANidine (ZANAFLEX) 2 MG tablet  Every 8 hours PRN        03/02/21 1214           11:55 AM Patient here with complaints of persistent pain into his right mid upper back that has been ongoing for several weeks.  Pain is reproducible on exam and likely muscle skeletal.  No signs of rashes to suggest shingles.  No chest pain or trouble breathing to suggest cardiopulmonary disease.  X-ray of the chest as well as thoracic spine without any concerning finding except mild to moderate degenerative disc disease.  Plan to provide symptomatic treatment and outpatient follow-up.  Return precaution given.  Care discussed with DR. Ray.   Domenic Moras, PA-C 03/02/21 1249    Pattricia Boss, MD 03/13/21 [phone]

## 2021-03-02 NOTE — ED Notes (Signed)
The patient refused to have updated vitals. He stated that he didn't need to have vitals done, he needed to see a doctor.

## 2021-03-02 NOTE — ED Notes (Signed)
Patient stated he does not want his vitals done, this tech informed pt that refusing care in the lobby can delay care patient understands but does not want vitals rechecked.

## 2021-03-02 NOTE — ED Notes (Signed)
Pt refused vitals check

## 2021-03-17 DIAGNOSIS — M109 Gout, unspecified: Secondary | ICD-10-CM | POA: Diagnosis not present

## 2021-03-17 DIAGNOSIS — I129 Hypertensive chronic kidney disease with stage 1 through stage 4 chronic kidney disease, or unspecified chronic kidney disease: Secondary | ICD-10-CM | POA: Diagnosis not present

## 2021-03-17 DIAGNOSIS — N183 Chronic kidney disease, stage 3 unspecified: Secondary | ICD-10-CM | POA: Diagnosis not present

## 2021-03-17 DIAGNOSIS — E669 Obesity, unspecified: Secondary | ICD-10-CM | POA: Diagnosis not present

## 2021-03-29 ENCOUNTER — Telehealth: Payer: Self-pay

## 2021-03-29 NOTE — Telephone Encounter (Signed)
Multiple attempts to reach patient- unable to reach patient and message left for patient to call back to the office to reschedule PV appt- if patient does not call back to the office prior to 5pm -PV and procedure appt will be cancelled

## 2021-04-03 ENCOUNTER — Encounter: Payer: Self-pay | Admitting: Internal Medicine

## 2021-04-03 ENCOUNTER — Ambulatory Visit (INDEPENDENT_AMBULATORY_CARE_PROVIDER_SITE_OTHER): Payer: Medicare Other | Admitting: Internal Medicine

## 2021-04-03 ENCOUNTER — Other Ambulatory Visit: Payer: Self-pay

## 2021-04-03 VITALS — BP 132/62 | HR 74 | Temp 97.8°F | Resp 18 | Ht 69.0 in | Wt 276.8 lb

## 2021-04-03 DIAGNOSIS — R7302 Impaired glucose tolerance (oral): Secondary | ICD-10-CM | POA: Diagnosis not present

## 2021-04-03 DIAGNOSIS — I10 Essential (primary) hypertension: Secondary | ICD-10-CM

## 2021-04-03 DIAGNOSIS — N184 Chronic kidney disease, stage 4 (severe): Secondary | ICD-10-CM | POA: Diagnosis not present

## 2021-04-03 DIAGNOSIS — E538 Deficiency of other specified B group vitamins: Secondary | ICD-10-CM

## 2021-04-03 DIAGNOSIS — I700 Atherosclerosis of aorta: Secondary | ICD-10-CM

## 2021-04-03 DIAGNOSIS — E559 Vitamin D deficiency, unspecified: Secondary | ICD-10-CM | POA: Diagnosis not present

## 2021-04-03 LAB — CBC WITH DIFFERENTIAL/PLATELET
Basophils Absolute: 0 10*3/uL (ref 0.0–0.1)
Basophils Relative: 0.7 % (ref 0.0–3.0)
Eosinophils Absolute: 0.2 10*3/uL (ref 0.0–0.7)
Eosinophils Relative: 4.3 % (ref 0.0–5.0)
HCT: 35.4 % — ABNORMAL LOW (ref 39.0–52.0)
Hemoglobin: 11.2 g/dL — ABNORMAL LOW (ref 13.0–17.0)
Lymphocytes Relative: 25.6 % (ref 12.0–46.0)
Lymphs Abs: 1.3 10*3/uL (ref 0.7–4.0)
MCHC: 31.7 g/dL (ref 30.0–36.0)
MCV: 88.6 fl (ref 78.0–100.0)
Monocytes Absolute: 0.6 10*3/uL (ref 0.1–1.0)
Monocytes Relative: 12.5 % — ABNORMAL HIGH (ref 3.0–12.0)
Neutro Abs: 2.8 10*3/uL (ref 1.4–7.7)
Neutrophils Relative %: 56.9 % (ref 43.0–77.0)
Platelets: 225 10*3/uL (ref 150.0–400.0)
RBC: 4 Mil/uL — ABNORMAL LOW (ref 4.22–5.81)
RDW: 15.3 % (ref 11.5–15.5)
WBC: 5 10*3/uL (ref 4.0–10.5)

## 2021-04-03 LAB — HEPATIC FUNCTION PANEL
ALT: 13 U/L (ref 0–53)
AST: 18 U/L (ref 0–37)
Albumin: 3.4 g/dL — ABNORMAL LOW (ref 3.5–5.2)
Alkaline Phosphatase: 103 U/L (ref 39–117)
Bilirubin, Direct: 0.1 mg/dL (ref 0.0–0.3)
Total Bilirubin: 0.4 mg/dL (ref 0.2–1.2)
Total Protein: 6.7 g/dL (ref 6.0–8.3)

## 2021-04-03 LAB — LIPID PANEL
Cholesterol: 141 mg/dL (ref 0–200)
HDL: 39 mg/dL — ABNORMAL LOW (ref >39.00)
LDL Cholesterol: 78 mg/dL (ref 0–99)
NonHDL: 102.23
Total CHOL/HDL Ratio: 4
Triglycerides: 123 mg/dL (ref 0.0–149.0)
VLDL: 24.6 mg/dL (ref 0.0–40.0)

## 2021-04-03 LAB — HEMOGLOBIN A1C: Hgb A1c MFr Bld: 5.9 % (ref 4.6–6.5)

## 2021-04-03 LAB — VITAMIN B12: Vitamin B-12: 1223 pg/mL — ABNORMAL HIGH (ref 211–911)

## 2021-04-03 LAB — VITAMIN D 25 HYDROXY (VIT D DEFICIENCY, FRACTURES): VITD: 33.45 ng/mL (ref 30.00–100.00)

## 2021-04-03 NOTE — Patient Instructions (Signed)
Please continue all other medications as before, and refills have been done if requested.  Please have the pharmacy call with any other refills you may need.  Please continue your efforts at being more active, low cholesterol diet, and weight control.  Please keep your appointments with your specialists as you may have planned  Please go to the LAB at the blood drawing area for the tests to be done  You will be contacted by phone if any changes need to be made immediately.  Otherwise, you will receive a letter about your results with an explanation, but please check with MyChart first.  Please remember to sign up for MyChart if you have not done so, as this will be important to you in the future with finding out test results, communicating by private email, and scheduling acute appointments online when needed.  Please make an Appointment to return in 6 months, or sooner if needed,

## 2021-04-03 NOTE — Progress Notes (Signed)
Patient ID: Troy Palmer, male   DOB: 1945-10-12, 75 y.o.   MRN: [id number]        Chief Complaint: follow up HTN,and hyperglycemia low b12 and vit d       HPI:  Troy Palmer is a 75 y.o. male here overall doing ok, not taking Vit D but is taking b12;  Pt denies chest pain, increased sob or doe, wheezing, orthopnea, PND, increased LE swelling, palpitations, dizziness or syncope.   Pt denies polydipsia, polyuria, or new focal neuro s/s.   Pt denies fever, wt loss, night sweats, loss of appetite, or other constitutional symptoms  No  other new complaints.  Sees renal q 6 mo.  Good compliance with meds.  No other new complaints  Wt Readings from Last 3 Encounters:  04/03/21 276 lb 12.8 oz (125.6 kg)  03/01/21 270 lb (122.5 kg)  12/27/20 270 lb (122.5 kg)   BP Readings from Last 3 Encounters:  04/03/21 132/62  03/02/21 (!) 141/69  10/26/20 124/78         Past Medical History:  Diagnosis Date   ALLERGIC RHINITIS 04/09/2007   Allergy    ASTHMA 12/04/2007   BACK PAIN 09/16/2009   BENIGN PROSTATIC HYPERTROPHY 04/09/2007   CEREBROVASCULAR ACCIDENT, HX OF 07/17/2010   COLONIC POLYPS, HX OF 12/04/2007   CONSTIPATION 09/25/2010   Degeneration of cervical intervertebral disc 03/28/2007   DEPRESSION 12/04/2007   DIVERTICULOSIS, COLON 12/04/2007   DYSPHAGIA UNSPECIFIED 03/16/2008   ERECTILE DYSFUNCTION 04/09/2007   ESOPHAGEAL STRICTURE 05/26/2008   GERD 12/04/2007   HEMORRHOIDS, RECURRENT 02/11/2008   HYPERLIPIDEMIA 12/04/2007   HYPERTENSION 03/28/2007   LEG PAIN, BILATERAL 01/25/2010   LOW BACK PAIN 04/09/2007   LUNG NODULE 12/04/2007   Osteoarth NOS-Unspec 03/28/2007   Other dysphagia 11/21/2009   Peripheral neuropathy 05/29/2018   POLYARTHRALGIA 07/13/2008   Polymyalgia rheumatica ([place]) 07/26/2008   PVD WITH CLAUDICATION 02/10/2010   RENAL INSUFFICIENCY 03/15/2010   Sleep apnea    no cpap    SLEEP APNEA, OBSTRUCTIVE, MODERATE 04/09/2007   Stroke ([place])    TIA    TB SKIN TEST, POSITIVE 03/28/2007    Past Surgical History:  Procedure Laterality Date   COLONOSCOPY     ROTATOR CUFF REPAIR Bilateral    TOTAL KNEE ARTHROPLASTY     x 2    reports that he has quit smoking. He has never used smokeless tobacco. He reports that he does not drink alcohol and does not use drugs. family history includes Asthma in an other family member; Healthy in his mother; Heart disease in his father; Hypertension in his brother; Lupus in his brother. Allergies  Allergen Reactions   Ace Inhibitors Swelling    Angioedema throat   Augmentin [Amoxicillin-Pot Clavulanate] Other (See Comments)    Marked weakness with po med x 3 doses   Doxycycline     Some GI upset   Sulfa Antibiotics Hives    And facial angioedema   Current Outpatient Medications on File Prior to Visit  Medication Sig Dispense Refill   acetaminophen (TYLENOL) 650 MG CR tablet Take 1 tablet (650 mg total) by mouth every 6 (six) hours as needed (back pain). 30 tablet 0   allopurinol (ZYLOPRIM) 100 MG tablet TAKE 1 TABLET BY MOUTH ONCE DAILY 90 tablet 1   amLODipine (NORVASC) 5 MG tablet Take 1 tablet (5 mg total) by mouth daily. 90 tablet 0   atorvastatin (LIPITOR) 80 MG tablet Take 1 tablet (80 mg total) by mouth  daily. 90 tablet 3   cetirizine (ZYRTEC) 10 MG tablet Take 1 tablet (10 mg total) by mouth daily. 30 tablet 11   colchicine 0.6 MG tablet Take 1 tablet (0.6 mg total) by mouth daily. For gout pain 30 tablet 1   furosemide (LASIX) 40 MG tablet TAKE 1 AND 1/2 TABLETS BY MOUTH TWICE DAILY 270 tablet 1   hydrALAZINE (APRESOLINE) 25 MG tablet Take 1 tablet (25 mg total) by mouth in the morning and at bedtime. 180 tablet 3   tamsulosin (FLOMAX) 0.4 MG CAPS capsule TAKE 1 CAPSULE(0.4 MG) BY MOUTH TWICE DAILY 180 capsule 3   tiZANidine (ZANAFLEX) 2 MG tablet Take 1 tablet (2 mg total) by mouth every 8 (eight) hours as needed for muscle spasms. 30 tablet 0   traMADol (ULTRAM) 50 MG tablet Take 1 tablet (50 mg total) by mouth every 6 (six)  hours as needed. 120 tablet 2   vitamin B-12 (CYANOCOBALAMIN) 1000 MCG tablet Take 1 tablet (1,000 mcg total) by mouth daily. 90 tablet 1   BYSTOLIC 20 MG TABS Take 1 tablet by mouth daily. (Patient not taking: No sig reported)     levocetirizine (XYZAL) 5 MG tablet Take 1 tablet (5 mg total) by mouth every evening. 30 tablet 11   lidocaine (LIDODERM) 5 % Place 1 patch onto the skin daily. Apply patch to the left shoulder area. Remove & Discard patch within 12 hours or as directed by MD (Patient not taking: No sig reported) 14 patch 0   metoprolol succinate (TOPROL-XL) 50 MG 24 hr tablet Take 1 tablet (50 mg total) by mouth daily. Take with or immediately following a meal. (Patient not taking: Reported on 04/03/2021) 90 tablet 3   pantoprazole (PROTONIX) 40 MG tablet Take 1 tablet (40 mg total) by mouth daily. (Patient not taking: Reported on 04/03/2021) 90 tablet 3   PFIZER-BIONTECH COVID-19 VACC 30 MCG/0.3ML injection  (Patient not taking: Reported on 04/03/2021)     No current facility-administered medications on file prior to visit.        ROS:  All others reviewed and negative.  Objective        PE:  BP 132/62 (BP Location: Right Arm)   Pulse 74   Temp 97.8 F (36.6 C)   Resp 18   Ht '5\' 9"'$  (1.753 m)   Wt 276 lb 12.8 oz (125.6 kg)   SpO2 99%   BMI 40.88 kg/m                 Constitutional: Pt appears in NAD               HENT: Head: NCAT.                Right Ear: External ear normal.                 Left Ear: External ear normal.                Eyes: . Pupils are equal, round, and reactive to light. Conjunctivae and EOM are normal               Nose: without d/c or deformity               Neck: Neck supple. Gross normal ROM               Cardiovascular: Normal rate and regular rhythm.  Pulmonary/Chest: Effort normal and breath sounds without rales or wheezing.                Abd:  Soft, NT, ND, + BS, no organomegaly               Neurological: Pt is alert. At  baseline orientation, motor grossly intact               Skin: Skin is warm. No rashes, no other new lesions, LE edema - none               Psychiatric: Pt behavior is normal without agitation   Micro: none  Cardiac tracings I have personally interpreted today:  none  Pertinent Radiological findings (summarize): none   Lab Results  Component Value Date   WBC 5.0 04/03/2021   HGB 11.2 (L) 04/03/2021   HCT 35.4 (L) 04/03/2021   PLT 225.0 04/03/2021   GLUCOSE 85 10/26/2020   CHOL 141 04/03/2021   TRIG 123.0 04/03/2021   HDL 39.00 (L) 04/03/2021   LDLDIRECT 156.9 10/10/2010   LDLCALC 78 04/03/2021   ALT 13 04/03/2021   AST 18 04/03/2021   NA 142 10/26/2020   K 4.6 10/26/2020   CL 106 10/26/2020   CREATININE 2.51 (H) 10/26/2020   BUN 35 (H) 10/26/2020   CO2 31 10/26/2020   TSH 2.02 10/04/2020   PSA 1.30 10/04/2020   HGBA1C 5.9 04/03/2021   Assessment/Plan:  Troy Palmer is a 75 y.o. Black or African American [2] male with  has a past medical history of ALLERGIC RHINITIS (04/09/2007), Allergy, ASTHMA (12/04/2007), BACK PAIN (09/16/2009), BENIGN PROSTATIC HYPERTROPHY (04/09/2007), CEREBROVASCULAR ACCIDENT, HX OF (07/17/2010), COLONIC POLYPS, HX OF (12/04/2007), CONSTIPATION (09/25/2010), Degeneration of cervical intervertebral disc (03/28/2007), DEPRESSION (12/04/2007), DIVERTICULOSIS, COLON (12/04/2007), DYSPHAGIA UNSPECIFIED (03/16/2008), ERECTILE DYSFUNCTION (04/09/2007), ESOPHAGEAL STRICTURE (05/26/2008), GERD (12/04/2007), HEMORRHOIDS, RECURRENT (02/11/2008), HYPERLIPIDEMIA (12/04/2007), HYPERTENSION (03/28/2007), LEG PAIN, BILATERAL (01/25/2010), LOW BACK PAIN (04/09/2007), LUNG NODULE (12/04/2007), Osteoarth NOS-Unspec (03/28/2007), Other dysphagia (11/21/2009), Peripheral neuropathy (05/29/2018), POLYARTHRALGIA (07/13/2008), Polymyalgia rheumatica ([place]) (07/26/2008), PVD WITH CLAUDICATION (02/10/2010), RENAL INSUFFICIENCY (03/15/2010), Sleep apnea, SLEEP APNEA, OBSTRUCTIVE, MODERATE (04/09/2007), Stroke  ([place]), and TB SKIN TEST, POSITIVE (03/28/2007).  Vitamin D deficiency Last vitamin D Lab Results  Component Value Date   VD25OH 33.45 04/03/2021   Low normal, to start oral replacement   B12 deficiency Lab Results  Component Value Date   VITAMINB12 1,223 (H) 04/03/2021   High overcontrolled, to decreasee oral replacement to m-w-f  - b12 1000 mcg qd  Impaired glucose tolerance Lab Results  Component Value Date   HGBA1C 5.9 04/03/2021   Stable, pt to continue current medical treatment  - diet   Essential hypertension BP Readings from Last 3 Encounters:  04/03/21 132/62  03/02/21 (!) 141/69  10/26/20 124/78   Improved now Stable, pt to continue medical treatment norvasc, hydralazine, toprol   CKD (chronic kidney disease) stage 4, GFR 15-29 ml/min (HCC) Lab Results  Component Value Date   CREATININE 2.51 (H) 10/26/2020   Stable overall, cont to avoid nephrotoxins, f/u renal q 6 mo  Aortic atherosclerosis (HCC) Pt to continue low chol diet, lipitor, excercise  Followup: Return in about 6 months (around 10/04/2021).  Cathlean Cower, MD 04/06/2021 9:13 PM [facility]

## 2021-04-06 ENCOUNTER — Encounter: Payer: Self-pay | Admitting: Internal Medicine

## 2021-04-06 NOTE — Assessment & Plan Note (Signed)
Last vitamin D Lab Results  Component Value Date   VD25OH 33.45 04/03/2021   Low normal, to start oral replacement

## 2021-04-06 NOTE — Assessment & Plan Note (Signed)
Lab Results  Component Value Date   CREATININE 2.51 (H) 10/26/2020   Stable overall, cont to avoid nephrotoxins, f/u renal q 6 mo

## 2021-04-06 NOTE — Assessment & Plan Note (Signed)
Pt to continue low chol diet, lipitor, excercise

## 2021-04-06 NOTE — Assessment & Plan Note (Signed)
Lab Results  Component Value Date   HGBA1C 5.9 04/03/2021   Stable, pt to continue current medical treatment  - diet

## 2021-04-06 NOTE — Assessment & Plan Note (Signed)
Lab Results  Component Value Date   VITAMINB12 1,223 (H) 04/03/2021   High overcontrolled, to decreasee oral replacement to m-w-f  - b12 1000 mcg qd

## 2021-04-06 NOTE — Assessment & Plan Note (Signed)
BP Readings from Last 3 Encounters:  04/03/21 132/62  03/02/21 (!) 141/69  10/26/20 124/78   Improved now Stable, pt to continue medical treatment norvasc, hydralazine, toprol

## 2021-04-12 ENCOUNTER — Encounter: Payer: Medicare Other | Admitting: Gastroenterology

## 2021-04-15 ENCOUNTER — Other Ambulatory Visit: Payer: Self-pay | Admitting: Internal Medicine

## 2021-04-15 NOTE — Telephone Encounter (Signed)
Please refill as per office routine med refill policy (all routine meds to be refilled for 3 mo or monthly (per pt preference) up to one year from last visit, then month to month grace period for 3 mo, then further med refills will have to be denied) ? ?

## 2021-05-16 ENCOUNTER — Telehealth: Payer: Self-pay

## 2021-05-16 MED ORDER — FUROSEMIDE 40 MG PO TABS: 60.0000 mg | ORAL_TABLET | Freq: Two times a day (BID) | ORAL | 1 refills | Status: DC

## 2021-05-17 ENCOUNTER — Encounter: Payer: Self-pay | Admitting: Gastroenterology

## 2021-05-25 DIAGNOSIS — H5203 Hypermetropia, bilateral: Secondary | ICD-10-CM | POA: Diagnosis not present

## 2021-06-09 ENCOUNTER — Other Ambulatory Visit: Payer: Self-pay

## 2021-06-09 ENCOUNTER — Ambulatory Visit (INDEPENDENT_AMBULATORY_CARE_PROVIDER_SITE_OTHER): Payer: Medicare Other | Admitting: Internal Medicine

## 2021-06-09 ENCOUNTER — Encounter: Payer: Self-pay | Admitting: Internal Medicine

## 2021-06-09 VITALS — BP 140/62 | HR 59 | Ht 69.0 in | Wt 287.0 lb

## 2021-06-09 DIAGNOSIS — Z23 Encounter for immunization: Secondary | ICD-10-CM | POA: Diagnosis not present

## 2021-06-09 DIAGNOSIS — I10 Essential (primary) hypertension: Secondary | ICD-10-CM

## 2021-06-09 DIAGNOSIS — R7302 Impaired glucose tolerance (oral): Secondary | ICD-10-CM

## 2021-06-09 DIAGNOSIS — M79645 Pain in left finger(s): Secondary | ICD-10-CM | POA: Diagnosis not present

## 2021-06-09 DIAGNOSIS — J309 Allergic rhinitis, unspecified: Secondary | ICD-10-CM | POA: Diagnosis not present

## 2021-06-09 DIAGNOSIS — E7800 Pure hypercholesterolemia, unspecified: Secondary | ICD-10-CM

## 2021-06-09 DIAGNOSIS — N184 Chronic kidney disease, stage 4 (severe): Secondary | ICD-10-CM | POA: Diagnosis not present

## 2021-06-09 DIAGNOSIS — Z1211 Encounter for screening for malignant neoplasm of colon: Secondary | ICD-10-CM

## 2021-06-09 DIAGNOSIS — G8929 Other chronic pain: Secondary | ICD-10-CM

## 2021-06-09 MED ORDER — METHYLPREDNISOLONE ACETATE 80 MG/ML IJ SUSP
80.0000 mg | Freq: Once | INTRAMUSCULAR | Status: AC
  Administered 2021-06-09: 80 mg via INTRAMUSCULAR

## 2021-06-09 NOTE — Patient Instructions (Addendum)
You had the flu shot today, and the steroid shot  You will be contacted regarding the referral for: cologuard kit at home  [facility] to use the OTC Voltaren gel (topical pain gel) for any joint pain as needed  Please continue all other medications as before, and refills have been done if requested.  Please have the pharmacy call with any other refills you may need.  Please continue your efforts at being more active, low cholesterol diet, and weight control.  Please keep your appointments with your specialists as you may have planned - rheumatology, and kidney  Please make an Appointment to return in Feb 22, or sooner if needed

## 2021-06-09 NOTE — Progress Notes (Signed)
Patient ID: GENE GLAZEBROOK, male   DOB: [birth year], 75 y.o.   MRN: [id number]        Chief Complaint: follow up allergies, OA left thumb, RA,, ckd, hld, htn,        HPI:  JYLAN LOEZA is a 75 y.o. male here with c/o several wks ongoing nasal allergy symptoms with clearish congestion, itch and sneezing, without fever, pain, ST, cough, swelling or wheezing.  Also with several months worsening more frequent and mild severe left CMC joint aching and pain, without swelling, worse with moderate use, better with rest.  Pt denies chest pain, increased sob or doe, wheezing, orthopnea, PND, increased LE swelling, palpitations, dizziness or syncope.   Pt denies polydipsia, polyuria, or new focal neuro s/s.  Pt requests cologuard , does not want colonoscopy.  Sees rheum and renal on regular basis.   Wt Readings from Last 3 Encounters:  06/09/21 287 lb (130.2 kg)  04/03/21 276 lb 12.8 oz (125.6 kg)  03/01/21 270 lb (122.5 kg)   BP Readings from Last 3 Encounters:  06/09/21 140/62  04/03/21 132/62  03/02/21 (!) 141/69         Past Medical History:  Diagnosis Date   ALLERGIC RHINITIS 04/09/2007   Allergy    ASTHMA 12/04/2007   BACK PAIN 09/16/2009   BENIGN PROSTATIC HYPERTROPHY 04/09/2007   CEREBROVASCULAR ACCIDENT, HX OF 07/17/2010   COLONIC POLYPS, HX OF 12/04/2007   CONSTIPATION 09/25/2010   Degeneration of cervical intervertebral disc 03/28/2007   DEPRESSION 12/04/2007   DIVERTICULOSIS, COLON 12/04/2007   DYSPHAGIA UNSPECIFIED 03/16/2008   ERECTILE DYSFUNCTION 04/09/2007   ESOPHAGEAL STRICTURE 05/26/2008   GERD 12/04/2007   HEMORRHOIDS, RECURRENT 02/11/2008   HYPERLIPIDEMIA 12/04/2007   HYPERTENSION 03/28/2007   LEG PAIN, BILATERAL 01/25/2010   LOW BACK PAIN 04/09/2007   LUNG NODULE 12/04/2007   Osteoarth NOS-Unspec 03/28/2007   Other dysphagia 11/21/2009   Peripheral neuropathy 05/29/2018   POLYARTHRALGIA 07/13/2008   Polymyalgia rheumatica ([place]) 07/26/2008   PVD WITH CLAUDICATION 02/10/2010   RENAL  INSUFFICIENCY 03/15/2010   Sleep apnea    no cpap    SLEEP APNEA, OBSTRUCTIVE, MODERATE 04/09/2007   Stroke ([place])    TIA    TB SKIN TEST, POSITIVE 03/28/2007   Past Surgical History:  Procedure Laterality Date   COLONOSCOPY     ROTATOR CUFF REPAIR Bilateral    TOTAL KNEE ARTHROPLASTY     x 2    reports that he has quit smoking. He has never used smokeless tobacco. He reports that he does not drink alcohol and does not use drugs. family history includes Asthma in an other family member; Healthy in his mother; Heart disease in his father; Hypertension in his brother; Lupus in his brother. Allergies  Allergen Reactions   Ace Inhibitors Swelling    Angioedema throat   Augmentin [Amoxicillin-Pot Clavulanate] Other (See Comments)    Marked weakness with po med x 3 doses   Doxycycline     Some GI upset   Sulfa Antibiotics Hives    And facial angioedema   Current Outpatient Medications on File Prior to Visit  Medication Sig Dispense Refill   acetaminophen (TYLENOL) 650 MG CR tablet Take 1 tablet (650 mg total) by mouth every 6 (six) hours as needed (back pain). 30 tablet 0   allopurinol (ZYLOPRIM) 100 MG tablet TAKE 1 TABLET BY MOUTH ONCE DAILY 90 tablet 1   amLODipine (NORVASC) 5 MG tablet TAKE 1 TABLET(5 MG) BY MOUTH DAILY 90  tablet 0   atorvastatin (LIPITOR) 80 MG tablet Take 1 tablet (80 mg total) by mouth daily. 90 tablet 3   BYSTOLIC 20 MG TABS Take 1 tablet by mouth daily.     cetirizine (ZYRTEC) 10 MG tablet Take 1 tablet (10 mg total) by mouth daily. 30 tablet 11   colchicine 0.6 MG tablet Take 1 tablet (0.6 mg total) by mouth daily. For gout pain 30 tablet 1   furosemide (LASIX) 40 MG tablet Take 1.5 tablets (60 mg total) by mouth 2 (two) times daily. 270 tablet 1   hydrALAZINE (APRESOLINE) 25 MG tablet Take 1 tablet (25 mg total) by mouth in the morning and at bedtime. 180 tablet 3   lidocaine (LIDODERM) 5 % Place 1 patch onto the skin daily. Apply patch to the left shoulder  area. Remove & Discard patch within 12 hours or as directed by MD 14 patch 0   metoprolol succinate (TOPROL-XL) 50 MG 24 hr tablet Take 1 tablet (50 mg total) by mouth daily. Take with or immediately following a meal. 90 tablet 3   pantoprazole (PROTONIX) 40 MG tablet Take 1 tablet (40 mg total) by mouth daily. 90 tablet 3   PFIZER-BIONTECH COVID-19 VACC 30 MCG/0.3ML injection      tamsulosin (FLOMAX) 0.4 MG CAPS capsule TAKE 1 CAPSULE(0.4 MG) BY MOUTH TWICE DAILY 180 capsule 3   tiZANidine (ZANAFLEX) 2 MG tablet Take 1 tablet (2 mg total) by mouth every 8 (eight) hours as needed for muscle spasms. 30 tablet 0   traMADol (ULTRAM) 50 MG tablet Take 1 tablet (50 mg total) by mouth every 6 (six) hours as needed. 120 tablet 2   vitamin B-12 (CYANOCOBALAMIN) 1000 MCG tablet Take 1 tablet (1,000 mcg total) by mouth daily. 90 tablet 1   levocetirizine (XYZAL) 5 MG tablet Take 1 tablet (5 mg total) by mouth every evening. 30 tablet 11   No current facility-administered medications on file prior to visit.        ROS:  All others reviewed and negative.  Objective        PE:  BP 140/62 (BP Location: Right Arm, Patient Position: Sitting, Cuff Size: Large)   Pulse (!) 59   Ht 5\' 9"  (1.753 m)   Wt 287 lb (130.2 kg)   SpO2 98%   BMI 42.38 kg/m                 Constitutional: Pt appears in NAD               HENT: Head: NCAT.                Right Ear: External ear normal.                 Left Ear: External ear normal.                Eyes: . Pupils are equal, round, and reactive to light. Conjunctivae and EOM are normal               Nose: without d/c or deformity               Neck: Neck supple. Gross normal ROM               Cardiovascular: Normal rate and regular rhythm.                 Pulmonary/Chest: Effort normal and breath sounds without rales or wheezing.  Abd:  Soft, NT, ND, + BS, no organomegaly               Neurological: Pt is alert. At baseline orientation, motor grossly  intact               Skin: Skin is warm. No rashes, no other new lesions, LE edema - none               Left thumb degenerative change and reduced rom, no effusion               Psychiatric: Pt behavior is normal without agitation   Micro: none  Cardiac tracings I have personally interpreted today:  none  Pertinent Radiological findings (summarize): none   Lab Results  Component Value Date   WBC 5.0 04/03/2021   HGB 11.2 (L) 04/03/2021   HCT 35.4 (L) 04/03/2021   PLT 225.0 04/03/2021   GLUCOSE 85 10/26/2020   CHOL 141 04/03/2021   TRIG 123.0 04/03/2021   HDL 39.00 (L) 04/03/2021   LDLDIRECT 156.9 10/10/2010   LDLCALC 78 04/03/2021   ALT 13 04/03/2021   AST 18 04/03/2021   NA 142 10/26/2020   K 4.6 10/26/2020   CL 106 10/26/2020   CREATININE 2.51 (H) 10/26/2020   BUN 35 (H) 10/26/2020   CO2 31 10/26/2020   TSH 2.02 10/04/2020   PSA 1.30 10/04/2020   HGBA1C 5.9 04/03/2021   Assessment/Plan:  MANUS WEEDMAN is a 75 y.o. Black or African American [2] male with  has a past medical history of ALLERGIC RHINITIS (04/09/2007), Allergy, ASTHMA (12/04/2007), BACK PAIN (09/16/2009), BENIGN PROSTATIC HYPERTROPHY (04/09/2007), CEREBROVASCULAR ACCIDENT, HX OF (07/17/2010), COLONIC POLYPS, HX OF (12/04/2007), CONSTIPATION (09/25/2010), Degeneration of cervical intervertebral disc (03/28/2007), DEPRESSION (12/04/2007), DIVERTICULOSIS, COLON (12/04/2007), DYSPHAGIA UNSPECIFIED (03/16/2008), ERECTILE DYSFUNCTION (04/09/2007), ESOPHAGEAL STRICTURE (05/26/2008), GERD (12/04/2007), HEMORRHOIDS, RECURRENT (02/11/2008), HYPERLIPIDEMIA (12/04/2007), HYPERTENSION (03/28/2007), LEG PAIN, BILATERAL (01/25/2010), LOW BACK PAIN (04/09/2007), LUNG NODULE (12/04/2007), Osteoarth NOS-Unspec (03/28/2007), Other dysphagia (11/21/2009), Peripheral neuropathy (05/29/2018), POLYARTHRALGIA (07/13/2008), Polymyalgia rheumatica ([place]) (07/26/2008), PVD WITH CLAUDICATION (02/10/2010), RENAL INSUFFICIENCY (03/15/2010), Sleep apnea, SLEEP APNEA,  OBSTRUCTIVE, MODERATE (04/09/2007), Stroke ([place]), and TB SKIN TEST, POSITIVE (03/28/2007).  Hypercholesterolemia Lab Results  Component Value Date   LDLCALC 78 04/03/2021   Stable, pt to continue current statin lipitor 80   Essential hypertension BP Readings from Last 3 Encounters:  06/09/21 140/62  04/03/21 132/62  03/02/21 (!) 141/69   Stable, pt to continue medical treatment amlodipione, bystolic, hydralazine   CKD (chronic kidney disease) stage 4, GFR 15-29 ml/min (HCC) Lab Results  Component Value Date   CREATININE 2.51 (H) 10/26/2020   Stable overall, cont to avoid nephrotoxins, f/u renal as planned  Impaired glucose tolerance Lab Results  Component Value Date   HGBA1C 5.9 04/03/2021   Stable, pt to continue current medical treatment  - diet   Chronic pain of left thumb Uncontrolled, for otc volt gel prn,  to f/u any worsening symptoms or concerns  Allergic rhinitis Mild uncontrolled, for depomedrol iim 80,  to f/u any worsening symptoms or concerns  Screen for colon cancer For cologuard  Followup: Return in about 4 months (around 10/04/2021), or if symptoms worsen or fail to improve.  Cathlean Cower, MD 06/11/2021 9:46 AM [facility]

## 2021-06-11 ENCOUNTER — Encounter: Payer: Self-pay | Admitting: Internal Medicine

## 2021-06-11 DIAGNOSIS — G8929 Other chronic pain: Secondary | ICD-10-CM | POA: Insufficient documentation

## 2021-06-11 DIAGNOSIS — M79645 Pain in left finger(s): Secondary | ICD-10-CM | POA: Insufficient documentation

## 2021-06-11 DIAGNOSIS — Z1211 Encounter for screening for malignant neoplasm of colon: Secondary | ICD-10-CM | POA: Insufficient documentation

## 2021-06-11 NOTE — Assessment & Plan Note (Signed)
For cologuard

## 2021-06-11 NOTE — Assessment & Plan Note (Signed)
Uncontrolled, for otc volt gel prn,  to f/u any worsening symptoms or concerns

## 2021-06-11 NOTE — Assessment & Plan Note (Signed)
Lab Results  Component Value Date   LDLCALC 78 04/03/2021   Stable, pt to continue current statin lipitor 80

## 2021-06-11 NOTE — Assessment & Plan Note (Signed)
Lab Results  Component Value Date   CREATININE 2.51 (H) 10/26/2020   Stable overall, cont to avoid nephrotoxins, f/u renal as planned

## 2021-06-11 NOTE — Assessment & Plan Note (Signed)
BP Readings from Last 3 Encounters:  06/09/21 140/62  04/03/21 132/62  03/02/21 (!) 141/69   Stable, pt to continue medical treatment amlodipione, bystolic, hydralazine

## 2021-06-11 NOTE — Assessment & Plan Note (Signed)
Lab Results  Component Value Date   HGBA1C 5.9 04/03/2021   Stable, pt to continue current medical treatment  - diet

## 2021-06-11 NOTE — Assessment & Plan Note (Signed)
Mild uncontrolled, for depomedrol iim 80,  to f/u any worsening symptoms or concerns

## 2021-06-13 DIAGNOSIS — N183 Chronic kidney disease, stage 3 unspecified: Secondary | ICD-10-CM | POA: Diagnosis not present

## 2021-06-13 DIAGNOSIS — I129 Hypertensive chronic kidney disease with stage 1 through stage 4 chronic kidney disease, or unspecified chronic kidney disease: Secondary | ICD-10-CM | POA: Diagnosis not present

## 2021-06-13 DIAGNOSIS — M109 Gout, unspecified: Secondary | ICD-10-CM | POA: Diagnosis not present

## 2021-06-13 DIAGNOSIS — E669 Obesity, unspecified: Secondary | ICD-10-CM | POA: Diagnosis not present

## 2021-06-14 DIAGNOSIS — Z1211 Encounter for screening for malignant neoplasm of colon: Secondary | ICD-10-CM | POA: Diagnosis not present

## 2021-06-22 ENCOUNTER — Encounter: Payer: Self-pay | Admitting: Internal Medicine

## 2021-06-22 LAB — COLOGUARD: COLOGUARD: NEGATIVE

## 2021-06-28 ENCOUNTER — Other Ambulatory Visit: Payer: Self-pay

## 2021-06-28 MED ORDER — AMLODIPINE BESYLATE 5 MG PO TABS: ORAL_TABLET | ORAL | 2 refills | Status: DC

## 2021-08-09 ENCOUNTER — Other Ambulatory Visit: Payer: Self-pay

## 2021-08-09 ENCOUNTER — Ambulatory Visit: Payer: Medicare Other | Admitting: Internal Medicine

## 2021-08-09 ENCOUNTER — Encounter: Payer: Self-pay | Admitting: Internal Medicine

## 2021-08-09 VITALS — BP 118/60 | HR 61 | Ht 69.0 in | Wt 279.0 lb

## 2021-08-12 ENCOUNTER — Encounter: Payer: Self-pay | Admitting: Internal Medicine

## 2021-08-12 NOTE — Progress Notes (Signed)
Pt left without being seen.

## 2021-08-12 NOTE — Patient Instructions (Signed)
None - pt not seen ?

## 2021-08-17 ENCOUNTER — Ambulatory Visit: Payer: Medicare Other | Admitting: Internal Medicine

## 2021-09-15 DIAGNOSIS — H2511 Age-related nuclear cataract, right eye: Secondary | ICD-10-CM | POA: Diagnosis not present

## 2021-09-15 DIAGNOSIS — H25811 Combined forms of age-related cataract, right eye: Secondary | ICD-10-CM | POA: Diagnosis not present

## 2021-09-18 ENCOUNTER — Ambulatory Visit: Payer: Medicare Other

## 2021-09-18 ENCOUNTER — Other Ambulatory Visit: Payer: Self-pay

## 2021-09-29 DIAGNOSIS — H2512 Age-related nuclear cataract, left eye: Secondary | ICD-10-CM | POA: Diagnosis not present

## 2021-09-29 DIAGNOSIS — H25812 Combined forms of age-related cataract, left eye: Secondary | ICD-10-CM | POA: Diagnosis not present

## 2021-10-04 ENCOUNTER — Encounter: Payer: Self-pay | Admitting: Internal Medicine

## 2021-10-04 ENCOUNTER — Ambulatory Visit (INDEPENDENT_AMBULATORY_CARE_PROVIDER_SITE_OTHER): Payer: Medicare PPO | Admitting: Internal Medicine

## 2021-10-04 ENCOUNTER — Other Ambulatory Visit: Payer: Self-pay

## 2021-10-04 VITALS — BP 132/68 | HR 69 | Temp 97.7°F | Ht 69.0 in | Wt 279.0 lb

## 2021-10-04 DIAGNOSIS — N184 Chronic kidney disease, stage 4 (severe): Secondary | ICD-10-CM | POA: Diagnosis not present

## 2021-10-04 DIAGNOSIS — D649 Anemia, unspecified: Secondary | ICD-10-CM

## 2021-10-04 DIAGNOSIS — E538 Deficiency of other specified B group vitamins: Secondary | ICD-10-CM | POA: Diagnosis not present

## 2021-10-04 DIAGNOSIS — I700 Atherosclerosis of aorta: Secondary | ICD-10-CM | POA: Diagnosis not present

## 2021-10-04 DIAGNOSIS — Z0001 Encounter for general adult medical examination with abnormal findings: Secondary | ICD-10-CM

## 2021-10-04 DIAGNOSIS — J309 Allergic rhinitis, unspecified: Secondary | ICD-10-CM

## 2021-10-04 DIAGNOSIS — I10 Essential (primary) hypertension: Secondary | ICD-10-CM | POA: Diagnosis not present

## 2021-10-04 DIAGNOSIS — R7302 Impaired glucose tolerance (oral): Secondary | ICD-10-CM | POA: Diagnosis not present

## 2021-10-04 DIAGNOSIS — J339 Nasal polyp, unspecified: Secondary | ICD-10-CM

## 2021-10-04 DIAGNOSIS — E559 Vitamin D deficiency, unspecified: Secondary | ICD-10-CM

## 2021-10-04 LAB — CBC WITH DIFFERENTIAL/PLATELET
Basophils Absolute: 0 10*3/uL (ref 0.0–0.1)
Basophils Relative: 0.5 % (ref 0.0–3.0)
Eosinophils Absolute: 0.3 10*3/uL (ref 0.0–0.7)
Eosinophils Relative: 4.8 % (ref 0.0–5.0)
HCT: 37.8 % — ABNORMAL LOW (ref 39.0–52.0)
Hemoglobin: 12 g/dL — ABNORMAL LOW (ref 13.0–17.0)
Lymphocytes Relative: 25.4 % (ref 12.0–46.0)
Lymphs Abs: 1.4 10*3/uL (ref 0.7–4.0)
MCHC: 31.7 g/dL (ref 30.0–36.0)
MCV: 86.4 fl (ref 78.0–100.0)
Monocytes Absolute: 0.9 10*3/uL (ref 0.1–1.0)
Monocytes Relative: 17.1 % — ABNORMAL HIGH (ref 3.0–12.0)
Neutro Abs: 2.9 10*3/uL (ref 1.4–7.7)
Neutrophils Relative %: 52.2 % (ref 43.0–77.0)
Platelets: 212 10*3/uL (ref 150.0–400.0)
RBC: 4.37 Mil/uL (ref 4.22–5.81)
RDW: 14.8 % (ref 11.5–15.5)
WBC: 5.5 10*3/uL (ref 4.0–10.5)

## 2021-10-04 LAB — URINALYSIS, ROUTINE W REFLEX MICROSCOPIC
Bilirubin Urine: NEGATIVE
Hgb urine dipstick: NEGATIVE
Ketones, ur: NEGATIVE
Leukocytes,Ua: NEGATIVE
Nitrite: NEGATIVE
RBC / HPF: NONE SEEN (ref 0–?)
Specific Gravity, Urine: 1.015 (ref 1.000–1.030)
Total Protein, Urine: NEGATIVE
Urine Glucose: NEGATIVE
Urobilinogen, UA: 0.2 (ref 0.0–1.0)
pH: 5.5 (ref 5.0–8.0)

## 2021-10-04 LAB — TSH: TSH: 2.67 u[IU]/mL (ref 0.35–5.50)

## 2021-10-04 LAB — LIPID PANEL
Cholesterol: 145 mg/dL (ref 0–200)
HDL: 46.5 mg/dL (ref >39.00)
LDL Cholesterol: 83 mg/dL (ref 0–99)
NonHDL: 98.77
Total CHOL/HDL Ratio: 3
Triglycerides: 79 mg/dL (ref 0.0–149.0)
VLDL: 15.8 mg/dL (ref 0.0–40.0)

## 2021-10-04 LAB — IBC PANEL
Iron: 67 ug/dL (ref 42–165)
Saturation Ratios: 26.6 % (ref 20.0–50.0)
TIBC: 252 ug/dL (ref 250.0–450.0)
Transferrin: 180 mg/dL — ABNORMAL LOW (ref 212.0–360.0)

## 2021-10-04 LAB — BASIC METABOLIC PANEL
BUN: 39 mg/dL — ABNORMAL HIGH (ref 6–23)
CO2: 32 mEq/L (ref 19–32)
Calcium: 9.2 mg/dL (ref 8.4–10.5)
Chloride: 108 mEq/L (ref 96–112)
Creatinine, Ser: 2.41 mg/dL — ABNORMAL HIGH (ref 0.40–1.50)
GFR: 25.67 mL/min — ABNORMAL LOW (ref >60.00)
Glucose, Bld: 67 mg/dL — ABNORMAL LOW (ref 70–99)
Potassium: 3.7 mEq/L (ref 3.5–5.1)
Sodium: 144 mEq/L (ref 135–145)

## 2021-10-04 LAB — PSA: PSA: 0.96 ng/mL (ref 0.10–4.00)

## 2021-10-04 LAB — HEPATIC FUNCTION PANEL
ALT: 15 U/L (ref 0–53)
AST: 16 U/L (ref 0–37)
Albumin: 3.8 g/dL (ref 3.5–5.2)
Alkaline Phosphatase: 122 U/L — ABNORMAL HIGH (ref 39–117)
Bilirubin, Direct: 0.1 mg/dL (ref 0.0–0.3)
Total Bilirubin: 0.3 mg/dL (ref 0.2–1.2)
Total Protein: 6.8 g/dL (ref 6.0–8.3)

## 2021-10-04 LAB — VITAMIN B12: Vitamin B-12: 1163 pg/mL — ABNORMAL HIGH (ref 211–911)

## 2021-10-04 LAB — VITAMIN D 25 HYDROXY (VIT D DEFICIENCY, FRACTURES): VITD: 50.14 ng/mL (ref 30.00–100.00)

## 2021-10-04 LAB — FERRITIN: Ferritin: 155.5 ng/mL (ref 22.0–322.0)

## 2021-10-04 LAB — HEMOGLOBIN A1C: Hgb A1c MFr Bld: 6 % (ref 4.6–6.5)

## 2021-10-04 MED ORDER — TRIAMCINOLONE ACETONIDE 55 MCG/ACT NA AERO: 2.0000 | INHALATION_SPRAY | Freq: Every day | NASAL | 12 refills | Status: DC

## 2021-10-04 MED ORDER — CHOLECALCIFEROL 50 MCG (2000 UT) PO TABS: ORAL_TABLET | ORAL | 99 refills | Status: AC

## 2021-10-04 NOTE — Progress Notes (Signed)
Patient ID: Troy Palmer, male   DOB: 1946/04/27, 76 y.o.   MRN: [id number]         Chief Complaint:: wellness exam and Follow-up (6 month f/u/Cough up mucus)  , low vit d, anemia, allergies, dm       HPI:  Troy Palmer is a 76 y.o. male here for wellness exam; declines covid booster, shingrix, colonoscopy o/w up to date                        Also s/p bilateral cataract out recently.  Does have several wks ongoing nasal allergy symptoms with clearish congestion, itch and sneezing, without fever, pain, ST, cough, swelling or wheezing.  Not taking Vit D.  No overt bleeding or bruising.  Pt denies chest pain, increased sob or doe, wheezing, orthopnea, PND, increased LE swelling, palpitations, dizziness or syncope.   Pt denies polydipsia, polyuria, or new focal neuro s/s.   Has hx of nasal polyps as well, fears they are back.   Pt denies fever, wt loss, night sweats, loss of appetite, or other constitutional symptoms       Wt Readings from Last 3 Encounters:  10/04/21 279 lb (126.6 kg)  08/09/21 279 lb (126.6 kg)  06/09/21 287 lb (130.2 kg)   BP Readings from Last 3 Encounters:  10/04/21 132/68  08/09/21 118/60  06/09/21 140/62   Immunization History  Administered Date(s) Administered   Fluad Quad(high Dose 65+) 06/04/2019, 05/24/2020, 06/09/2021   Influenza Whole 05/26/2008, 05/21/2012, 05/11/2013   Influenza, High Dose Seasonal PF 05/05/2018   Influenza-Unspecified 05/29/2014   PFIZER(Purple Top)SARS-COV-2 Vaccination 01/14/2020, 02/04/2020, 08/16/2020   Pneumococcal Conjugate-13 10/15/2013   Pneumococcal Polysaccharide-23 10/15/2011   Tetanus 10/14/2012   There are no preventive care reminders to display for this patient.     Past Medical History:  Diagnosis Date   ALLERGIC RHINITIS 04/09/2007   Allergy    ASTHMA 12/04/2007   BACK PAIN 09/16/2009   BENIGN PROSTATIC HYPERTROPHY 04/09/2007   CEREBROVASCULAR ACCIDENT, HX OF 07/17/2010   COLONIC POLYPS, HX OF 12/04/2007    CONSTIPATION 09/25/2010   Degeneration of cervical intervertebral disc 03/28/2007   DEPRESSION 12/04/2007   DIVERTICULOSIS, COLON 12/04/2007   DYSPHAGIA UNSPECIFIED 03/16/2008   ERECTILE DYSFUNCTION 04/09/2007   ESOPHAGEAL STRICTURE 05/26/2008   GERD 12/04/2007   HEMORRHOIDS, RECURRENT 02/11/2008   HYPERLIPIDEMIA 12/04/2007   HYPERTENSION 03/28/2007   LEG PAIN, BILATERAL 01/25/2010   LOW BACK PAIN 04/09/2007   LUNG NODULE 12/04/2007   Osteoarth NOS-Unspec 03/28/2007   Other dysphagia 11/21/2009   Peripheral neuropathy 05/29/2018   POLYARTHRALGIA 07/13/2008   Polymyalgia rheumatica ([place]) 07/26/2008   PVD WITH CLAUDICATION 02/10/2010   RENAL INSUFFICIENCY 03/15/2010   Sleep apnea    no cpap    SLEEP APNEA, OBSTRUCTIVE, MODERATE 04/09/2007   Stroke ([place])    TIA    TB SKIN TEST, POSITIVE 03/28/2007   Past Surgical History:  Procedure Laterality Date   COLONOSCOPY     ROTATOR CUFF REPAIR Bilateral    TOTAL KNEE ARTHROPLASTY     x 2    reports that he has quit smoking. He has never used smokeless tobacco. He reports that he does not drink alcohol and does not use drugs. family history includes Asthma in an other family member; Healthy in his mother; Heart disease in his father; Hypertension in his brother; Lupus in his brother. Allergies  Allergen Reactions   Ace Inhibitors Swelling    Angioedema throat  Augmentin [Amoxicillin-Pot Clavulanate] Other (See Comments)    Marked weakness with po med x 3 doses   Doxycycline     Some GI upset   Sulfa Antibiotics Hives    And facial angioedema   Current Outpatient Medications on File Prior to Visit  Medication Sig Dispense Refill   acetaminophen (TYLENOL) 650 MG CR tablet Take 1 tablet (650 mg total) by mouth every 6 (six) hours as needed (back pain). 30 tablet 0   allopurinol (ZYLOPRIM) 100 MG tablet TAKE 1 TABLET BY MOUTH ONCE DAILY 90 tablet 1   amLODipine (NORVASC) 5 MG tablet TAKE 1 TABLET(5 MG) BY MOUTH DAILY 90 tablet 2   atorvastatin  (LIPITOR) 80 MG tablet Take 1 tablet (80 mg total) by mouth daily. 90 tablet 3   BYSTOLIC 20 MG TABS Take 1 tablet by mouth daily.     cetirizine (ZYRTEC) 10 MG tablet Take 1 tablet (10 mg total) by mouth daily. 30 tablet 11   colchicine 0.6 MG tablet Take 1 tablet (0.6 mg total) by mouth daily. For gout pain 30 tablet 1   furosemide (LASIX) 40 MG tablet Take 1.5 tablets (60 mg total) by mouth 2 (two) times daily. 270 tablet 1   hydrALAZINE (APRESOLINE) 25 MG tablet Take 1 tablet (25 mg total) by mouth in the morning and at bedtime. 180 tablet 3   lidocaine (LIDODERM) 5 % Place 1 patch onto the skin daily. Apply patch to the left shoulder area. Remove & Discard patch within 12 hours or as directed by MD 14 patch 0   metoprolol succinate (TOPROL-XL) 50 MG 24 hr tablet Take 1 tablet (50 mg total) by mouth daily. Take with or immediately following a meal. 90 tablet 3   pantoprazole (PROTONIX) 40 MG tablet Take 1 tablet (40 mg total) by mouth daily. 90 tablet 3   PFIZER-BIONTECH COVID-19 VACC 30 MCG/0.3ML injection      tamsulosin (FLOMAX) 0.4 MG CAPS capsule TAKE 1 CAPSULE(0.4 MG) BY MOUTH TWICE DAILY 180 capsule 3   tiZANidine (ZANAFLEX) 2 MG tablet Take 1 tablet (2 mg total) by mouth every 8 (eight) hours as needed for muscle spasms. 30 tablet 0   traMADol (ULTRAM) 50 MG tablet Take 1 tablet (50 mg total) by mouth every 6 (six) hours as needed. 120 tablet 2   vitamin B-12 (CYANOCOBALAMIN) 1000 MCG tablet Take 1 tablet (1,000 mcg total) by mouth daily. 90 tablet 1   levocetirizine (XYZAL) 5 MG tablet Take 1 tablet (5 mg total) by mouth every evening. 30 tablet 11   No current facility-administered medications on file prior to visit.        ROS:  All others reviewed and negative.  Objective        PE:  BP 132/68 (BP Location: Right Arm, Patient Position: Sitting, Cuff Size: Large)    Pulse 69    Temp 97.7 F (36.5 C) (Oral)    Ht 5\' 9"  (1.753 m)    Wt 279 lb (126.6 kg)    SpO2 99%    BMI 41.20  kg/m                 Constitutional: Pt appears in NAD               HENT: Head: NCAT.                Right Ear: External ear normal.  Left Ear: External ear normal.                Eyes: . Pupils are equal, round, and reactive to light. Conjunctivae and EOM are normal               Nose: without d/c or deformity               Neck: Neck supple. Gross normal ROM               Cardiovascular: Normal rate and regular rhythm.                 Pulmonary/Chest: Effort normal and breath sounds without rales or wheezing.                Abd:  Soft, NT, ND, + BS, no organomegaly               Neurological: Pt is alert. At baseline orientation, motor grossly intact               Skin: Skin is warm. No rashes, no other new lesions, LE edema - none               Psychiatric: Pt behavior is normal without agitation   Micro: none  Cardiac tracings I have personally interpreted today:  none  Pertinent Radiological findings (summarize): none   Lab Results  Component Value Date   WBC 5.5 10/04/2021   HGB 12.0 (L) 10/04/2021   HCT 37.8 (L) 10/04/2021   PLT 212.0 10/04/2021   GLUCOSE 67 (L) 10/04/2021   CHOL 145 10/04/2021   TRIG 79.0 10/04/2021   HDL 46.50 10/04/2021   LDLDIRECT 156.9 10/10/2010   LDLCALC 83 10/04/2021   ALT 15 10/04/2021   AST 16 10/04/2021   NA 144 10/04/2021   K 3.7 10/04/2021   CL 108 10/04/2021   CREATININE 2.41 (H) 10/04/2021   BUN 39 (H) 10/04/2021   CO2 32 10/04/2021   TSH 2.67 10/04/2021   PSA 0.96 10/04/2021   HGBA1C 6.0 10/04/2021   Assessment/Plan:  Troy Palmer is a 76 y.o. Black or African American [2] male with  has a past medical history of ALLERGIC RHINITIS (04/09/2007), Allergy, ASTHMA (12/04/2007), BACK PAIN (09/16/2009), BENIGN PROSTATIC HYPERTROPHY (04/09/2007), CEREBROVASCULAR ACCIDENT, HX OF (07/17/2010), COLONIC POLYPS, HX OF (12/04/2007), CONSTIPATION (09/25/2010), Degeneration of cervical intervertebral disc (03/28/2007), DEPRESSION  (12/04/2007), DIVERTICULOSIS, COLON (12/04/2007), DYSPHAGIA UNSPECIFIED (03/16/2008), ERECTILE DYSFUNCTION (04/09/2007), ESOPHAGEAL STRICTURE (05/26/2008), GERD (12/04/2007), HEMORRHOIDS, RECURRENT (02/11/2008), HYPERLIPIDEMIA (12/04/2007), HYPERTENSION (03/28/2007), LEG PAIN, BILATERAL (01/25/2010), LOW BACK PAIN (04/09/2007), LUNG NODULE (12/04/2007), Osteoarth NOS-Unspec (03/28/2007), Other dysphagia (11/21/2009), Peripheral neuropathy (05/29/2018), POLYARTHRALGIA (07/13/2008), Polymyalgia rheumatica ([place]) (07/26/2008), PVD WITH CLAUDICATION (02/10/2010), RENAL INSUFFICIENCY (03/15/2010), Sleep apnea, SLEEP APNEA, OBSTRUCTIVE, MODERATE (04/09/2007), Stroke ([place]), and TB SKIN TEST, POSITIVE (03/28/2007).  Encounter for well adult exam with abnormal findings Age and sex appropriate education and counseling updated with regular exercise and diet Referrals for preventative services - decliens colonoscopy Immunizations addressed - declines covid booster and shingrix Smoking counseling  - none needed Evidence for depression or other mood disorder - none significant Most recent labs reviewed. I have personally reviewed and have noted: 1) the patient's medical and social history 2) The patient's current medications and supplements 3) The patient's height, weight, and BMI have been recorded in the chart   Anemia Lab Results  Component Value Date   WBC 5.5 10/04/2021   HGB 12.0 (L) 10/04/2021   HCT 37.8 (L) 10/04/2021  MCV 86.4 10/04/2021   PLT 212.0 10/04/2021   Mild for iron labs  Aortic atherosclerosis (HCC) Pt to continue lipitor, exercise, low chol diet  B12 deficiency Lab Results  Component Value Date   VITAMINB12 1,163 (H) 10/04/2021   Stable, cont oral replacement - b12 1000 mcg qd   Vitamin D deficiency Last vitamin D Lab Results  Component Value Date   VD25OH 50.14 10/04/2021   Stable, cont oral replacement   CKD (chronic kidney disease) stage 4, GFR 15-29 ml/min (HCC) Lab Results   Component Value Date   CREATININE 2.41 (H) 10/04/2021   Stable overall, cont to avoid nephrotoxins   Impaired glucose tolerance Lab Results  Component Value Date   HGBA1C 6.0 10/04/2021   Stable, pt to continue current medical treatment   diet   Essential hypertension BP Readings from Last 3 Encounters:  10/04/21 132/68  08/09/21 118/60  06/09/21 140/62   Stable, pt to continue medical treatment norvasc, hydralazine   Allergic rhinitis With seasonal flare, for nasacort restart, refer ent per pt request  Followup: Return in about 6 months (around 04/03/2022).  Cathlean Cower, MD 10/07/2021 10:04 PM [facility]

## 2021-10-04 NOTE — Patient Instructions (Signed)
Please double your Vitamin D at home by taking the higher strength such as 2000 units VItamin D3\  Please also take the OTC Nasacort for the nasal allergies and polyps  You will be contacted regarding the referral for: ENT for the nasal polyps  Please continue all other medications as before, and refills have been done if requested.  Please have the pharmacy call with any other refills you may need.  Please continue your efforts at being more active, low cholesterol diet, and weight control.  You are otherwise up to date with prevention measures today.  Please keep your appointments with your specialists as you may have planned  Please go to the LAB at the blood drawing area for the tests to be done, including the iron levels this time  You will be contacted by phone if any changes need to be made immediately.  Otherwise, you will receive a letter about your results with an explanation, but please check with MyChart first.  Please remember to sign up for MyChart if you have not done so, as this will be important to you in the future with finding out test results, communicating by private email, and scheduling acute appointments online when needed.

## 2021-10-07 ENCOUNTER — Encounter: Payer: Self-pay | Admitting: Internal Medicine

## 2021-10-07 NOTE — Assessment & Plan Note (Signed)
Pt to continue lipitor, exercise, low chol diet

## 2021-10-07 NOTE — Assessment & Plan Note (Signed)
With seasonal flare, for nasacort restart, refer ent per pt request

## 2021-10-07 NOTE — Assessment & Plan Note (Signed)
Lab Results  Component Value Date   CREATININE 2.41 (H) 10/04/2021   Stable overall, cont to avoid nephrotoxins

## 2021-10-07 NOTE — Assessment & Plan Note (Signed)
Lab Results  Component Value Date   VITAMINB12 1,163 (H) 10/04/2021   Stable, cont oral replacement - b12 1000 mcg qd

## 2021-10-07 NOTE — Assessment & Plan Note (Signed)
BP Readings from Last 3 Encounters:  10/04/21 132/68  08/09/21 118/60  06/09/21 140/62   Stable, pt to continue medical treatment norvasc, hydralazine

## 2021-10-07 NOTE — Assessment & Plan Note (Signed)
Lab Results  Component Value Date   WBC 5.5 10/04/2021   HGB 12.0 (L) 10/04/2021   HCT 37.8 (L) 10/04/2021   MCV 86.4 10/04/2021   PLT 212.0 10/04/2021   Mild for iron labs

## 2021-10-07 NOTE — Assessment & Plan Note (Signed)
Last vitamin D Lab Results  Component Value Date   VD25OH 50.14 10/04/2021   Stable, cont oral replacement

## 2021-10-07 NOTE — Assessment & Plan Note (Signed)
Age and sex appropriate education and counseling updated with regular exercise and diet Referrals for preventative services - decliens colonoscopy Immunizations addressed - declines covid booster and shingrix Smoking counseling  - none needed Evidence for depression or other mood disorder - none significant Most recent labs reviewed. I have personally reviewed and have noted: 1) the patient's medical and social history 2) The patient's current medications and supplements 3) The patient's height, weight, and BMI have been recorded in the chart

## 2021-10-07 NOTE — Assessment & Plan Note (Signed)
Lab Results  Component Value Date   HGBA1C 6.0 10/04/2021   Stable, pt to continue current medical treatment   diet

## 2021-10-11 DIAGNOSIS — Z6841 Body Mass Index (BMI) 40.0 and over, adult: Secondary | ICD-10-CM | POA: Diagnosis not present

## 2021-10-11 DIAGNOSIS — J309 Allergic rhinitis, unspecified: Secondary | ICD-10-CM | POA: Diagnosis not present

## 2021-10-11 DIAGNOSIS — N400 Enlarged prostate without lower urinary tract symptoms: Secondary | ICD-10-CM | POA: Diagnosis not present

## 2021-10-11 DIAGNOSIS — K219 Gastro-esophageal reflux disease without esophagitis: Secondary | ICD-10-CM | POA: Diagnosis not present

## 2021-10-11 DIAGNOSIS — E785 Hyperlipidemia, unspecified: Secondary | ICD-10-CM | POA: Diagnosis not present

## 2021-10-11 DIAGNOSIS — M109 Gout, unspecified: Secondary | ICD-10-CM | POA: Diagnosis not present

## 2021-10-11 DIAGNOSIS — I10 Essential (primary) hypertension: Secondary | ICD-10-CM | POA: Diagnosis not present

## 2021-10-11 DIAGNOSIS — N529 Male erectile dysfunction, unspecified: Secondary | ICD-10-CM | POA: Diagnosis not present

## 2021-10-26 ENCOUNTER — Other Ambulatory Visit: Payer: Self-pay

## 2021-10-26 ENCOUNTER — Ambulatory Visit (INDEPENDENT_AMBULATORY_CARE_PROVIDER_SITE_OTHER): Payer: Medicare PPO | Admitting: Nurse Practitioner

## 2021-10-26 VITALS — BP 138/72 | HR 61 | Temp 97.6°F | Ht 69.0 in | Wt 280.5 lb

## 2021-10-26 DIAGNOSIS — E669 Obesity, unspecified: Secondary | ICD-10-CM | POA: Diagnosis not present

## 2021-10-26 DIAGNOSIS — J309 Allergic rhinitis, unspecified: Secondary | ICD-10-CM

## 2021-10-26 DIAGNOSIS — I129 Hypertensive chronic kidney disease with stage 1 through stage 4 chronic kidney disease, or unspecified chronic kidney disease: Secondary | ICD-10-CM | POA: Diagnosis not present

## 2021-10-26 DIAGNOSIS — M109 Gout, unspecified: Secondary | ICD-10-CM | POA: Diagnosis not present

## 2021-10-26 DIAGNOSIS — N183 Chronic kidney disease, stage 3 unspecified: Secondary | ICD-10-CM | POA: Diagnosis not present

## 2021-10-26 DIAGNOSIS — Z6841 Body Mass Index (BMI) 40.0 and over, adult: Secondary | ICD-10-CM | POA: Diagnosis not present

## 2021-10-26 MED ORDER — PREDNISONE 20 MG PO TABS
40.0000 mg | ORAL_TABLET | Freq: Every day | ORAL | 0 refills | Status: DC
Start: 2021-10-26 — End: 2021-12-20

## 2021-10-26 NOTE — Assessment & Plan Note (Addendum)
Chronic.  He will continue taking his nasal spray and I have provided him with contact information for ear nose and throat providers we can follow-up with them.  I did consult with his PCP regarding steroid prescription.  Dr. Jenny Reichmann is agreeable for patient to have short-term course of steroids.  Will prescribe 40mg  of prednisone daily for 5 days.  Patient was encouraged to call office if symptoms worsen or do not improve over the next week or so.  Patient was educated on side effects of prednisone including insomnia, increased risk of GI bleed, increased appetite, and irritability.  He was told to take medication in the morning with food and to avoid NSAID use while taking the medication.  The patient reports there understanding. ?

## 2021-10-26 NOTE — Patient Instructions (Signed)
Dr Kasandra Knudsen Raynelle Bring MD, PA [place], [address] [id number] [phone]  ?

## 2021-10-26 NOTE — Progress Notes (Signed)
? ? ? ?Subjective:  ?Patient ID: Troy Palmer, male    DOB: [birth year]  Age: 76 y.o. MRN: [id number] ? ?CC:  ?Chief Complaint  ?Patient presents with  ? Nasal Congestion  ? watery eyes  ? Cough  ?  Productive cough   ?  ? ? ?HPI  ?This patient arrives today for the above. ? ?He reports he has chronic seasonal allergies, and these will often initiate in the winter months and get progressively worse until spring and pollen season.  This is when his allergies peak and then start to get better.  He tells me he has been having symptoms consistent with chronic allergies, he reports he has been given steroids in the past which help to dry his postnasal drip and he is wondering if he can have this today.  He reports that he is having so much nasal congestion and discharge that is causing nausea.  He tells me that he has been coughing but he feels is only triggered by the postnasal drip and does not feel he is short of breath or producing sputum from his lungs.  He has been using his nasal spray.  He has been referred to ENT in the past but has not had an appointment made yet.  He is requesting ENT his contact info so he can schedule an appointment. ? ?Past Medical History:  ?Diagnosis Date  ? ALLERGIC RHINITIS 04/09/2007  ? Allergy   ? ASTHMA 12/04/2007  ? BACK PAIN 09/16/2009  ? BENIGN PROSTATIC HYPERTROPHY 04/09/2007  ? CEREBROVASCULAR ACCIDENT, HX OF 07/17/2010  ? COLONIC POLYPS, HX OF 12/04/2007  ? CONSTIPATION 09/25/2010  ? Degeneration of cervical intervertebral disc 03/28/2007  ? DEPRESSION 12/04/2007  ? DIVERTICULOSIS, COLON 12/04/2007  ? DYSPHAGIA UNSPECIFIED 03/16/2008  ? ERECTILE DYSFUNCTION 04/09/2007  ? ESOPHAGEAL STRICTURE 05/26/2008  ? GERD 12/04/2007  ? HEMORRHOIDS, RECURRENT 02/11/2008  ? HYPERLIPIDEMIA 12/04/2007  ? HYPERTENSION 03/28/2007  ? LEG PAIN, BILATERAL 01/25/2010  ? LOW BACK PAIN 04/09/2007  ? LUNG NODULE 12/04/2007  ? Osteoarth NOS-Unspec 03/28/2007  ? Other dysphagia 11/21/2009  ? Peripheral neuropathy  05/29/2018  ? POLYARTHRALGIA 07/13/2008  ? Polymyalgia rheumatica ([place]) 07/26/2008  ? PVD WITH CLAUDICATION 02/10/2010  ? RENAL INSUFFICIENCY 03/15/2010  ? Sleep apnea   ? no cpap   ? SLEEP APNEA, OBSTRUCTIVE, MODERATE 04/09/2007  ? Stroke [facility])   ? TIA   ? TB SKIN TEST, POSITIVE 03/28/2007  ? ? ? ? ?Family History  ?Problem Relation Age of Onset  ? Healthy Mother   ? Heart disease Father   ? Lupus Brother   ? Hypertension Brother   ? Asthma Other   ? Colon cancer Neg Hx   ? Colon polyps Neg Hx   ? Esophageal cancer Neg Hx   ? Rectal cancer Neg Hx   ? Stomach cancer Neg Hx   ? ? ?Social History  ? ?Social History Narrative  ? Patient does not get regular exercise.  ? 6 children - 1 died with suicide, 1 with homicide  ?   ? Lives with granddaugter and great grandson  ?   ? Pt does have steps in the home  ?   ? Highest level of education 12th grade  ?   ? Disabled  ?   ? Left handed  ? ?Social History  ? ?Tobacco Use  ? Smoking status: Former  ? Smokeless tobacco: Never  ?Substance Use Topics  ? Alcohol use: No  ? ? ? ?Current  Meds  ?Medication Sig  ? acetaminophen (TYLENOL) 650 MG CR tablet Take 1 tablet (650 mg total) by mouth every 6 (six) hours as needed (back pain).  ? allopurinol (ZYLOPRIM) 100 MG tablet TAKE 1 TABLET BY MOUTH ONCE DAILY  ? amLODipine (NORVASC) 5 MG tablet TAKE 1 TABLET(5 MG) BY MOUTH DAILY  ? atorvastatin (LIPITOR) 80 MG tablet Take 1 tablet (80 mg total) by mouth daily.  ? BYSTOLIC 20 MG TABS Take 1 tablet by mouth daily.  ? cetirizine (ZYRTEC) 10 MG tablet Take 1 tablet (10 mg total) by mouth daily.  ? Cholecalciferol 50 MCG (2000 UT) TABS 1 tab by mouth once daily  ? colchicine 0.6 MG tablet Take 1 tablet (0.6 mg total) by mouth daily. For gout pain  ? furosemide (LASIX) 40 MG tablet Take 1.5 tablets (60 mg total) by mouth 2 (two) times daily.  ? hydrALAZINE (APRESOLINE) 25 MG tablet Take 1 tablet (25 mg total) by mouth in the morning and at bedtime.  ? lidocaine (LIDODERM) 5 % Place 1 patch onto  the skin daily. Apply patch to the left shoulder area. Remove & Discard patch within 12 hours or as directed by MD  ? metoprolol succinate (TOPROL-XL) 50 MG 24 hr tablet Take 1 tablet (50 mg total) by mouth daily. Take with or immediately following a meal.  ? pantoprazole (PROTONIX) 40 MG tablet Take 1 tablet (40 mg total) by mouth daily.  ? PFIZER-BIONTECH COVID-19 VACC 30 MCG/0.3ML injection   ? predniSONE (DELTASONE) 20 MG tablet Take 2 tablets (40 mg total) by mouth daily with breakfast.  ? tamsulosin (FLOMAX) 0.4 MG CAPS capsule TAKE 1 CAPSULE(0.4 MG) BY MOUTH TWICE DAILY  ? tiZANidine (ZANAFLEX) 2 MG tablet Take 1 tablet (2 mg total) by mouth every 8 (eight) hours as needed for muscle spasms.  ? traMADol (ULTRAM) 50 MG tablet Take 1 tablet (50 mg total) by mouth every 6 (six) hours as needed.  ? triamcinolone (NASACORT) 55 MCG/ACT AERO nasal inhaler Place 2 sprays into the nose daily.  ? vitamin B-12 (CYANOCOBALAMIN) 1000 MCG tablet Take 1 tablet (1,000 mcg total) by mouth daily.  ? ? ?ROS:  ?Review of Systems  ?Constitutional:  Negative for chills and fever.  ?HENT:  Positive for congestion and sinus pain.   ?Respiratory:  Positive for cough and wheezing. Negative for sputum production and shortness of breath.   ?Gastrointestinal:  Positive for nausea.  ?Neurological:  Positive for headaches.  ? ? ?Objective:  ? ?Today's Vitals: BP 138/72   Pulse 61   Temp 97.6 ?F (36.4 ?C) (Oral)   Ht 5\' 9"  (1.753 m)   Wt 280 lb 8 oz (127.2 kg)   SpO2 97%   BMI 41.42 kg/m?  ?Vitals with BMI 10/26/2021 10/04/2021 08/09/2021  ?Height 5\' 9"  5\' 9"  5\' 9"   ?Weight 280 lbs 8 oz 279 lbs 279 lbs  ?BMI 41.4 41.18 41.18  ?Systolic 786 754 492  ?Diastolic 72 68 60  ?Pulse 61 69 61  ?  ? ?Physical Exam ?Vitals reviewed.  ?Constitutional:   ?   Appearance: Normal appearance.  ?HENT:  ?   Head: Normocephalic and atraumatic.  ?Cardiovascular:  ?   Rate and Rhythm: Normal rate and regular rhythm.  ?Pulmonary:  ?   Effort: Pulmonary  effort is normal.  ?   Breath sounds: Normal breath sounds.  ?Musculoskeletal:  ?   Cervical back: Neck supple.  ?Skin: ?   General: Skin is warm and dry.  ?  Neurological:  ?   Mental Status: He is alert and oriented to person, place, and time.  ?Psychiatric:     ?   Mood and Affect: Mood normal.     ?   Behavior: Behavior normal.     ?   Thought Content: Thought content normal.     ?   Judgment: Judgment normal.  ? ? ? ? ? ? ? ?Assessment and Plan  ? ?1. Allergic rhinitis, unspecified seasonality, unspecified trigger   ? ? ? ?Plan: ?See plan via problem list below. ? ? ? ?Tests ordered ?No orders of the defined types were placed in this encounter. ? ? ? ? ?Meds ordered this encounter  ?Medications  ? predniSONE (DELTASONE) 20 MG tablet  ?  Sig: Take 2 tablets (40 mg total) by mouth daily with breakfast.  ?  Dispense:  10 tablet  ?  Refill:  0  ?  Order Specific Question:   Supervising Provider  ?  Answer:   Binnie Rail [[id number]]  ? ? ?Patient to follow-up as needed. ? ?Ailene Ards, NP ? ?

## 2021-11-23 ENCOUNTER — Telehealth: Payer: Self-pay

## 2021-11-23 MED ORDER — ATORVASTATIN CALCIUM 80 MG PO TABS: 80.0000 mg | ORAL_TABLET | Freq: Every day | ORAL | 3 refills | Status: DC

## 2021-11-23 NOTE — Telephone Encounter (Signed)
Refill sent to pharmacy.   

## 2021-11-23 NOTE — Telephone Encounter (Signed)
Pt is requesting a refill on: ?atorvastatin (LIPITOR) 80 MG tablet (90 day supply) ? ?Pharmacy: ? [facility] [address] ? ?LOV 10/04/21 ?

## 2021-12-05 ENCOUNTER — Other Ambulatory Visit: Payer: Self-pay | Admitting: Internal Medicine

## 2021-12-05 NOTE — Telephone Encounter (Signed)
Please refill as per office routine med refill policy (all routine meds to be refilled for 3 mo or monthly (per pt preference) up to one year from last visit, then month to month grace period for 3 mo, then further med refills will have to be denied) ? ?

## 2021-12-11 ENCOUNTER — Telehealth: Payer: Self-pay | Admitting: Internal Medicine

## 2021-12-11 MED ORDER — METOPROLOL SUCCINATE ER 50 MG PO TB24
50.0000 mg | ORAL_TABLET | Freq: Every day | ORAL | 2 refills | Status: DC
Start: 2021-12-11 — End: 2022-12-04

## 2021-12-11 NOTE — Telephone Encounter (Signed)
1.Medication Requested: ?metoprolol succinate (TOPROL-XL) 50 MG 24 hr tablet ?2. Pharmacy (Name, Street, [place]): ?[facility], [place] [address] Phone:  [phone]  ?Fax:  [phone]  ?  ? ?3. On Med List: yes  ? ?4. Last Visit with PCP: ? ?5. Next visit date with PCP: ? ? ?Agent: Please be advised that RX refills may take up to 3 business days. We ask that you follow-up with your pharmacy.  ?

## 2021-12-20 ENCOUNTER — Encounter: Payer: Self-pay | Admitting: Internal Medicine

## 2021-12-20 ENCOUNTER — Ambulatory Visit (INDEPENDENT_AMBULATORY_CARE_PROVIDER_SITE_OTHER): Payer: Medicare PPO | Admitting: Internal Medicine

## 2021-12-20 VITALS — BP 124/68 | HR 70 | Temp 98.2°F | Ht 69.0 in | Wt 275.0 lb

## 2021-12-20 DIAGNOSIS — J339 Nasal polyp, unspecified: Secondary | ICD-10-CM | POA: Diagnosis not present

## 2021-12-20 DIAGNOSIS — I10 Essential (primary) hypertension: Secondary | ICD-10-CM | POA: Diagnosis not present

## 2021-12-20 DIAGNOSIS — J309 Allergic rhinitis, unspecified: Secondary | ICD-10-CM

## 2021-12-20 DIAGNOSIS — J019 Acute sinusitis, unspecified: Secondary | ICD-10-CM | POA: Diagnosis not present

## 2021-12-20 MED ORDER — PREDNISONE 10 MG PO TABS: ORAL_TABLET | ORAL | 0 refills | Status: DC

## 2021-12-20 MED ORDER — LEVOFLOXACIN 500 MG PO TABS: 500.0000 mg | ORAL_TABLET | Freq: Every day | ORAL | 0 refills | Status: AC

## 2021-12-20 MED ORDER — METHYLPREDNISOLONE ACETATE 80 MG/ML IJ SUSP
80.0000 mg | Freq: Once | INTRAMUSCULAR | Status: AC
  Administered 2021-12-20: 80 mg via INTRAMUSCULAR

## 2021-12-20 NOTE — Progress Notes (Signed)
Patient ID: Troy Palmer, male   DOB: 05/12/1946, 76 y.o.   MRN: [id number] ? ? ? ?    Chief Complaint: follow up sinus and allergy symptoms ? ?     HPI:  Troy Palmer is a 76 y.o. male here with hx of nasal polyps but cant see ENT until next wk, now with  Here with 2-3 days acute onset fever, right  maxillary and facial pain, pressure, headache, general weakness and malaise, and greenish d/c, with mild ST and cough, but pt denies chest pain, wheezing, increased sob or doe, orthopnea, PND, increased LE swelling, palpitations, dizziness or syncope.  Does have several wks ongoing nasal allergy symptoms with clearish congestion, itch and sneezing.  Did see optho 3 days ago with tx topical eye steroid for allergic conjunctivitis.   ?      ?Wt Readings from Last 3 Encounters:  ?12/20/21 275 lb (124.7 kg)  ?10/26/21 280 lb 8 oz (127.2 kg)  ?10/04/21 279 lb (126.6 kg)  ? ?BP Readings from Last 3 Encounters:  ?12/20/21 124/68  ?10/26/21 138/72  ?10/04/21 132/68  ? ?      ?Past Medical History:  ?Diagnosis Date  ? ALLERGIC RHINITIS 04/09/2007  ? Allergy   ? ASTHMA 12/04/2007  ? BACK PAIN 09/16/2009  ? BENIGN PROSTATIC HYPERTROPHY 04/09/2007  ? CEREBROVASCULAR ACCIDENT, HX OF 07/17/2010  ? COLONIC POLYPS, HX OF 12/04/2007  ? CONSTIPATION 09/25/2010  ? Degeneration of cervical intervertebral disc 03/28/2007  ? DEPRESSION 12/04/2007  ? DIVERTICULOSIS, COLON 12/04/2007  ? DYSPHAGIA UNSPECIFIED 03/16/2008  ? ERECTILE DYSFUNCTION 04/09/2007  ? ESOPHAGEAL STRICTURE 05/26/2008  ? GERD 12/04/2007  ? HEMORRHOIDS, RECURRENT 02/11/2008  ? HYPERLIPIDEMIA 12/04/2007  ? HYPERTENSION 03/28/2007  ? LEG PAIN, BILATERAL 01/25/2010  ? LOW BACK PAIN 04/09/2007  ? LUNG NODULE 12/04/2007  ? Osteoarth NOS-Unspec 03/28/2007  ? Other dysphagia 11/21/2009  ? Peripheral neuropathy 05/29/2018  ? POLYARTHRALGIA 07/13/2008  ? Polymyalgia rheumatica ([place]) 07/26/2008  ? PVD WITH CLAUDICATION 02/10/2010  ? RENAL INSUFFICIENCY 03/15/2010  ? Sleep apnea   ? no cpap   ? SLEEP APNEA,  OBSTRUCTIVE, MODERATE 04/09/2007  ? Stroke [facility])   ? TIA   ? TB SKIN TEST, POSITIVE 03/28/2007  ? ?Past Surgical History:  ?Procedure Laterality Date  ? COLONOSCOPY    ? ROTATOR CUFF REPAIR Bilateral   ? TOTAL KNEE ARTHROPLASTY    ? x 2  ? ? reports that he has quit smoking. He has never used smokeless tobacco. He reports that he does not drink alcohol and does not use drugs. ?family history includes Asthma in an other family member; Healthy in his mother; Heart disease in his father; Hypertension in his brother; Lupus in his brother. ?Allergies  ?Allergen Reactions  ? Ace Inhibitors Swelling  ?  Angioedema throat  ? Augmentin [Amoxicillin-Pot Clavulanate] Other (See Comments)  ?  Marked weakness with po med x 3 doses  ? Doxycycline   ?  Some GI upset  ? Sulfa Antibiotics Hives  ?  And facial angioedema  ? ?Current Outpatient Medications on File Prior to Visit  ?Medication Sig Dispense Refill  ? acetaminophen (TYLENOL) 650 MG CR tablet Take 1 tablet (650 mg total) by mouth every 6 (six) hours as needed (back pain). 30 tablet 0  ? allopurinol (ZYLOPRIM) 100 MG tablet TAKE 1 TABLET BY MOUTH ONCE DAILY 90 tablet 1  ? amLODipine (NORVASC) 5 MG tablet TAKE 1 TABLET(5 MG) BY MOUTH DAILY 90 tablet 2  ?  atorvastatin (LIPITOR) 80 MG tablet Take 1 tablet (80 mg total) by mouth daily. 90 tablet 3  ? BYSTOLIC 20 MG TABS Take 1 tablet by mouth daily.    ? Cholecalciferol 50 MCG (2000 UT) TABS 1 tab by mouth once daily 30 tablet 99  ? colchicine 0.6 MG tablet Take 1 tablet (0.6 mg total) by mouth daily. For gout pain 30 tablet 1  ? furosemide (LASIX) 40 MG tablet TAKE 1 AND 1/2 TABLETS(60 MG) BY MOUTH TWICE DAILY 270 tablet 2  ? hydrALAZINE (APRESOLINE) 25 MG tablet Take 1 tablet (25 mg total) by mouth in the morning and at bedtime. 180 tablet 3  ? lidocaine (LIDODERM) 5 % Place 1 patch onto the skin daily. Apply patch to the left shoulder area. Remove & Discard patch within 12 hours or as directed by MD 14 patch 0  ? metoprolol  succinate (TOPROL-XL) 50 MG 24 hr tablet Take 1 tablet (50 mg total) by mouth daily. Take with or immediately following a meal. 90 tablet 2  ? pantoprazole (PROTONIX) 40 MG tablet Take 1 tablet (40 mg total) by mouth daily. 90 tablet 3  ? PFIZER-BIONTECH COVID-19 VACC 30 MCG/0.3ML injection     ? tamsulosin (FLOMAX) 0.4 MG CAPS capsule TAKE 1 CAPSULE(0.4 MG) BY MOUTH TWICE DAILY 180 capsule 3  ? tiZANidine (ZANAFLEX) 2 MG tablet Take 1 tablet (2 mg total) by mouth every 8 (eight) hours as needed for muscle spasms. 30 tablet 0  ? traMADol (ULTRAM) 50 MG tablet Take 1 tablet (50 mg total) by mouth every 6 (six) hours as needed. 120 tablet 2  ? triamcinolone (NASACORT) 55 MCG/ACT AERO nasal inhaler Place 2 sprays into the nose daily. 1 each 12  ? vitamin B-12 (CYANOCOBALAMIN) 1000 MCG tablet Take 1 tablet (1,000 mcg total) by mouth daily. 90 tablet 1  ? cetirizine (ZYRTEC) 10 MG tablet Take 1 tablet (10 mg total) by mouth daily. 30 tablet 11  ? levocetirizine (XYZAL) 5 MG tablet Take 1 tablet (5 mg total) by mouth every evening. 30 tablet 11  ? ?No current facility-administered medications on file prior to visit.  ? ?     ROS:  All others reviewed and negative. ? ?Objective  ? ?     PE:  BP 124/68 (BP Location: Right Arm, Patient Position: Sitting, Cuff Size: Large)   Pulse 70   Temp 98.2 ?F (36.8 ?C) (Oral)   Ht 5\' 9"  (1.753 m)   Wt 275 lb (124.7 kg)   SpO2 97%   BMI 40.61 kg/m?  ? ?              Constitutional: Pt appears in NAD ?              HENT: Head: NCAT.  ?              Right Ear: External ear normal.   ?              Left Ear: External ear normal. Bilat tm's with mild erythema.  Max sinus areas mild tender.  Pharynx with mild erythema, no exudate  ?              Eyes: . Pupils are equal, round, and reactive to light. Conjunctivae and EOM are normal ?              Nose: without d/c or deformity ?              Neck:  Neck supple. Gross normal ROM ?              Cardiovascular: Normal rate and regular  rhythm.   ?              Pulmonary/Chest: Effort normal and breath sounds without rales or wheezing.  ?              Abd:  Soft, NT, ND, + BS, no organomegaly ?              Neurological: Pt is alert. At baseline orientation, motor grossly intact ?              Skin: Skin is warm. No rashes, no other new lesions, LE edema - none ?              Psychiatric: Pt behavior is normal without agitation  ? ?Micro: none ? ?Cardiac tracings I have personally interpreted today:  none ? ?Pertinent Radiological findings (summarize): none  ? ?Lab Results  ?Component Value Date  ? WBC 5.5 10/04/2021  ? HGB 12.0 (L) 10/04/2021  ? HCT 37.8 (L) 10/04/2021  ? PLT 212.0 10/04/2021  ? GLUCOSE 67 (L) 10/04/2021  ? CHOL 145 10/04/2021  ? TRIG 79.0 10/04/2021  ? HDL 46.50 10/04/2021  ? LDLDIRECT 156.9 10/10/2010  ? [place] 83 10/04/2021  ? ALT 15 10/04/2021  ? AST 16 10/04/2021  ? NA 144 10/04/2021  ? K 3.7 10/04/2021  ? CL 108 10/04/2021  ? CREATININE 2.41 (H) 10/04/2021  ? BUN 39 (H) 10/04/2021  ? CO2 32 10/04/2021  ? TSH 2.67 10/04/2021  ? PSA 0.96 10/04/2021  ? HGBA1C 6.0 10/04/2021  ? ?Assessment/Plan:  ?Troy Palmer is a 76 y.o. Black or African American [2] male with  has a past medical history of ALLERGIC RHINITIS (04/09/2007), Allergy, ASTHMA (12/04/2007), BACK PAIN (09/16/2009), BENIGN PROSTATIC HYPERTROPHY (04/09/2007), CEREBROVASCULAR ACCIDENT, HX OF (07/17/2010), COLONIC POLYPS, HX OF (12/04/2007), CONSTIPATION (09/25/2010), Degeneration of cervical intervertebral disc (03/28/2007), DEPRESSION (12/04/2007), DIVERTICULOSIS, COLON (12/04/2007), DYSPHAGIA UNSPECIFIED (03/16/2008), ERECTILE DYSFUNCTION (04/09/2007), ESOPHAGEAL STRICTURE (05/26/2008), GERD (12/04/2007), HEMORRHOIDS, RECURRENT (02/11/2008), HYPERLIPIDEMIA (12/04/2007), HYPERTENSION (03/28/2007), LEG PAIN, BILATERAL (01/25/2010), LOW BACK PAIN (04/09/2007), LUNG NODULE (12/04/2007), Osteoarth NOS-Unspec (03/28/2007), Other dysphagia (11/21/2009), Peripheral neuropathy (05/29/2018),  POLYARTHRALGIA (07/13/2008), Polymyalgia rheumatica ([place]) (07/26/2008), PVD WITH CLAUDICATION (02/10/2010), RENAL INSUFFICIENCY (03/15/2010), Sleep apnea, SLEEP APNEA, OBSTRUCTIVE, MODERATE (04/09/2007), Stroke ([place]), and

## 2021-12-20 NOTE — Patient Instructions (Signed)
You had the steroid shot today ? ?Please take all new medication as prescribed - the antibiotic (levaquin 500 mg), and prednisone ? ?Please continue all other medications as before, including the steroid drops for the eyes ? ?Please have the pharmacy call with any other refills you may need. ? ?Please continue your efforts at being more active, low cholesterol diet, and weight control. ? ?You are otherwise up to date with prevention measures today. ? ?Please keep your appointments with your specialists as you may have planned - ENT for the nasal polyps ? ? ? ? ? ?

## 2021-12-24 ENCOUNTER — Encounter: Payer: Self-pay | Admitting: Internal Medicine

## 2021-12-24 DIAGNOSIS — J019 Acute sinusitis, unspecified: Secondary | ICD-10-CM | POA: Insufficient documentation

## 2021-12-24 DIAGNOSIS — J339 Nasal polyp, unspecified: Secondary | ICD-10-CM | POA: Insufficient documentation

## 2021-12-24 NOTE — Assessment & Plan Note (Signed)
To continue nasal steroid as well, f/u ent next wk as planned ?

## 2021-12-24 NOTE — Assessment & Plan Note (Signed)
Mild to mod seasonal flare, for depomedrol im 80, prednisone taper,  to f/u any worsening symptoms or concerns ?

## 2021-12-24 NOTE — Assessment & Plan Note (Signed)
Lab Results  ?Component Value Date  ? CREATININE 2.41 (H) 10/04/2021  ? ?Stable overall, cont to avoid nephrotoxins ? ?

## 2021-12-24 NOTE — Assessment & Plan Note (Signed)
Mild to mod, for antibx course,  to f/u any worsening symptoms or concerns 

## 2022-01-12 ENCOUNTER — Ambulatory Visit: Payer: Medicare PPO | Admitting: Internal Medicine

## 2022-01-12 ENCOUNTER — Telehealth: Payer: Self-pay | Admitting: Internal Medicine

## 2022-01-12 MED ORDER — PANTOPRAZOLE SODIUM 40 MG PO TBEC: 40.0000 mg | DELAYED_RELEASE_TABLET | Freq: Every day | ORAL | 3 refills | Status: DC

## 2022-01-12 NOTE — Telephone Encounter (Signed)
Pt is out of his pantoprazole RX and would like [place] to call it in for him please. Says the pharmacy called [place] several times but still hasn't gotten the RX from [place].

## 2022-01-12 NOTE — Telephone Encounter (Signed)
Prescription sent to pharmacy.

## 2022-02-20 ENCOUNTER — Telehealth: Payer: Self-pay

## 2022-02-20 NOTE — Telephone Encounter (Signed)
Pt is requesting a refill on: tamsulosin (FLOMAX) 0.4 MG CAPS capsule  Pharmacy: [facility] [place], [place] - [address] AT [place] 12/20/21 ROV 04/03/22

## 2022-02-21 MED ORDER — TAMSULOSIN HCL 0.4 MG PO CAPS: ORAL_CAPSULE | ORAL | 3 refills | Status: DC

## 2022-02-21 NOTE — Telephone Encounter (Signed)
Refill sent to pharmacy.   

## 2022-02-27 ENCOUNTER — Telehealth: Payer: Self-pay | Admitting: Internal Medicine

## 2022-02-27 NOTE — Telephone Encounter (Signed)
LVM for pt to rtn my call to schedule AWV with [facility] call back # [phone] 

## 2022-03-12 ENCOUNTER — Telehealth: Payer: Self-pay

## 2022-03-12 ENCOUNTER — Ambulatory Visit: Payer: Medicare PPO

## 2022-03-12 NOTE — Telephone Encounter (Signed)
Called x 3 no answer no voice mail. Patient may reschedule for next available appointment.   L.Wilson,LPN

## 2022-03-22 ENCOUNTER — Telehealth: Payer: Self-pay | Admitting: Internal Medicine

## 2022-03-22 NOTE — Telephone Encounter (Signed)
Carlos requests call back with status of authorization paperwork. Call back number [phone].

## 2022-03-23 NOTE — Telephone Encounter (Signed)
Troy Palmer has called again asking if the paperwork can be sent ASAP.

## 2022-03-23 NOTE — Telephone Encounter (Signed)
Troy Palmer with [name] is asking that the last OV notes be faxed over for a rx approval.  The fax: [phone].Marland Kitchen ATTN: Troy Palmer please mark cover letter as HIGH PRIORITY when faxed.

## 2022-03-23 NOTE — Telephone Encounter (Signed)
I dont know what rx is needed or what this all means

## 2022-03-26 ENCOUNTER — Telehealth: Payer: Self-pay | Admitting: Internal Medicine

## 2022-03-26 NOTE — Telephone Encounter (Signed)
error 

## 2022-03-26 NOTE — Telephone Encounter (Signed)
Unable to contact caller, appropriate documents faxed.

## 2022-03-28 DIAGNOSIS — Z6841 Body Mass Index (BMI) 40.0 and over, adult: Secondary | ICD-10-CM | POA: Diagnosis not present

## 2022-03-28 DIAGNOSIS — N183 Chronic kidney disease, stage 3 unspecified: Secondary | ICD-10-CM | POA: Diagnosis not present

## 2022-03-28 DIAGNOSIS — E669 Obesity, unspecified: Secondary | ICD-10-CM | POA: Diagnosis not present

## 2022-03-28 DIAGNOSIS — N189 Chronic kidney disease, unspecified: Secondary | ICD-10-CM | POA: Diagnosis not present

## 2022-03-28 DIAGNOSIS — I129 Hypertensive chronic kidney disease with stage 1 through stage 4 chronic kidney disease, or unspecified chronic kidney disease: Secondary | ICD-10-CM | POA: Diagnosis not present

## 2022-03-28 DIAGNOSIS — M109 Gout, unspecified: Secondary | ICD-10-CM | POA: Diagnosis not present

## 2022-04-03 ENCOUNTER — Ambulatory Visit (INDEPENDENT_AMBULATORY_CARE_PROVIDER_SITE_OTHER): Payer: Medicare PPO | Admitting: Internal Medicine

## 2022-04-03 ENCOUNTER — Encounter: Payer: Self-pay | Admitting: Internal Medicine

## 2022-04-03 VITALS — BP 138/68 | HR 60 | Temp 97.6°F | Ht 69.0 in | Wt 278.0 lb

## 2022-04-03 DIAGNOSIS — G8929 Other chronic pain: Secondary | ICD-10-CM

## 2022-04-03 DIAGNOSIS — E7800 Pure hypercholesterolemia, unspecified: Secondary | ICD-10-CM | POA: Diagnosis not present

## 2022-04-03 DIAGNOSIS — Z125 Encounter for screening for malignant neoplasm of prostate: Secondary | ICD-10-CM | POA: Diagnosis not present

## 2022-04-03 DIAGNOSIS — E538 Deficiency of other specified B group vitamins: Secondary | ICD-10-CM | POA: Diagnosis not present

## 2022-04-03 DIAGNOSIS — N184 Chronic kidney disease, stage 4 (severe): Secondary | ICD-10-CM | POA: Diagnosis not present

## 2022-04-03 DIAGNOSIS — M5442 Lumbago with sciatica, left side: Secondary | ICD-10-CM | POA: Diagnosis not present

## 2022-04-03 DIAGNOSIS — M5441 Lumbago with sciatica, right side: Secondary | ICD-10-CM | POA: Diagnosis not present

## 2022-04-03 DIAGNOSIS — J309 Allergic rhinitis, unspecified: Secondary | ICD-10-CM

## 2022-04-03 DIAGNOSIS — R7302 Impaired glucose tolerance (oral): Secondary | ICD-10-CM | POA: Diagnosis not present

## 2022-04-03 DIAGNOSIS — E559 Vitamin D deficiency, unspecified: Secondary | ICD-10-CM

## 2022-04-03 NOTE — Assessment & Plan Note (Signed)
Lab Results  Component Value Date   CREATININE 2.41 (H) 10/04/2021   Stable overall and had f/u labs 1 wk ago per renal, cont to avoid nephrotoxins, f/u renal as planned

## 2022-04-03 NOTE — Assessment & Plan Note (Signed)
Uncontrolled with seasonal flare, to retart zyrtec and nasacort asd, Declines depomedrol IM today

## 2022-04-03 NOTE — Assessment & Plan Note (Signed)
Lab Results  Component Value Date   VITAMINB12 1,163 (H) 10/04/2021   Stable, cont oral replacement - b12 1000 mcg qd

## 2022-04-03 NOTE — Assessment & Plan Note (Signed)
Lab Results  Component Value Date   LDLCALC 83 10/04/2021   Uncontrolled on high dose statin lipitor 80 mg, pt to continue current statin and for lower chol diet, as o/w declines any change

## 2022-04-03 NOTE — Assessment & Plan Note (Signed)
Chronic stable, but ok for handicapped parking application signed today

## 2022-04-03 NOTE — Assessment & Plan Note (Signed)
Last vitamin D Lab Results  Component Value Date   VD25OH 50.14 10/04/2021   Stable, cont oral replacement

## 2022-04-03 NOTE — Progress Notes (Signed)
Patient ID: Troy Palmer, male   DOB: 06/04/1946, 76 y.o.   MRN: [id number]        Chief Complaint: follow up HTN, HLD and hyperglycemia, ckd. allergies       HPI:  Troy Palmer is a 76 y.o. male here overall doing ok; Pt denies chest pain, increased sob or doe, wheezing, orthopnea, PND, increased LE swelling, palpitations, dizziness or syncope.   Pt denies polydipsia, polyuria, or new focal neuro s/s.    Pt denies fever, wt loss, night sweats, loss of appetite, or other constitutional symptoms  Did see renal last wk with labs, has not heard of results yet, does not want further labs today.  Does have several wks ongoing nasal allergy symptoms with clearish congestion, itch and sneezing, without fever, pain, ST, cough, swelling or wheezing.  Pt continues to have recurring LBP without change in severity, bowel or bladder change, fever, wt loss,  worsening LE pain/numbness/weakness, gait change or falls but asks for handicapped parking application.   Wt Readings from Last 3 Encounters:  04/03/22 278 lb (126.1 kg)  12/20/21 275 lb (124.7 kg)  10/26/21 280 lb 8 oz (127.2 kg)   BP Readings from Last 3 Encounters:  04/03/22 138/68  12/20/21 124/68  10/26/21 138/72         Past Medical History:  Diagnosis Date   ALLERGIC RHINITIS 04/09/2007   Allergy    ASTHMA 12/04/2007   BACK PAIN 09/16/2009   BENIGN PROSTATIC HYPERTROPHY 04/09/2007   CEREBROVASCULAR ACCIDENT, HX OF 07/17/2010   COLONIC POLYPS, HX OF 12/04/2007   CONSTIPATION 09/25/2010   Degeneration of cervical intervertebral disc 03/28/2007   DEPRESSION 12/04/2007   DIVERTICULOSIS, COLON 12/04/2007   DYSPHAGIA UNSPECIFIED 03/16/2008   ERECTILE DYSFUNCTION 04/09/2007   ESOPHAGEAL STRICTURE 05/26/2008   GERD 12/04/2007   HEMORRHOIDS, RECURRENT 02/11/2008   HYPERLIPIDEMIA 12/04/2007   HYPERTENSION 03/28/2007   LEG PAIN, BILATERAL 01/25/2010   LOW BACK PAIN 04/09/2007   LUNG NODULE 12/04/2007   Osteoarth NOS-Unspec 03/28/2007   Other dysphagia  11/21/2009   Peripheral neuropathy 05/29/2018   POLYARTHRALGIA 07/13/2008   Polymyalgia rheumatica ([place]) 07/26/2008   PVD WITH CLAUDICATION 02/10/2010   RENAL INSUFFICIENCY 03/15/2010   Sleep apnea    no cpap    SLEEP APNEA, OBSTRUCTIVE, MODERATE 04/09/2007   Stroke ([place])    TIA    TB SKIN TEST, POSITIVE 03/28/2007   Past Surgical History:  Procedure Laterality Date   COLONOSCOPY     ROTATOR CUFF REPAIR Bilateral    TOTAL KNEE ARTHROPLASTY     x 2    reports that he has quit smoking. He has never used smokeless tobacco. He reports that he does not drink alcohol and does not use drugs. family history includes Asthma in an other family member; Healthy in his mother; Heart disease in his father; Hypertension in his brother; Lupus in his brother. Allergies  Allergen Reactions   Ace Inhibitors Swelling    Angioedema throat   Augmentin [Amoxicillin-Pot Clavulanate] Other (See Comments)    Marked weakness with po med x 3 doses   Doxycycline     Some GI upset   Sulfa Antibiotics Hives    And facial angioedema   Current Outpatient Medications on File Prior to Visit  Medication Sig Dispense Refill   acetaminophen (TYLENOL) 650 MG CR tablet Take 1 tablet (650 mg total) by mouth every 6 (six) hours as needed (back pain). 30 tablet 0   allopurinol (ZYLOPRIM) 100 MG tablet  TAKE 1 TABLET BY MOUTH ONCE DAILY 90 tablet 1   amLODipine (NORVASC) 5 MG tablet TAKE 1 TABLET(5 MG) BY MOUTH DAILY 90 tablet 2   atorvastatin (LIPITOR) 80 MG tablet Take 1 tablet (80 mg total) by mouth daily. 90 tablet 3   BYSTOLIC 20 MG TABS Take 1 tablet by mouth daily.     Cholecalciferol 50 MCG (2000 UT) TABS 1 tab by mouth once daily 30 tablet 99   colchicine 0.6 MG tablet Take 1 tablet (0.6 mg total) by mouth daily. For gout pain 30 tablet 1   furosemide (LASIX) 40 MG tablet TAKE 1 AND 1/2 TABLETS(60 MG) BY MOUTH TWICE DAILY 270 tablet 2   hydrALAZINE (APRESOLINE) 25 MG tablet Take 1 tablet (25 mg total) by mouth in the  morning and at bedtime. 180 tablet 3   lidocaine (LIDODERM) 5 % Place 1 patch onto the skin daily. Apply patch to the left shoulder area. Remove & Discard patch within 12 hours or as directed by MD 14 patch 0   metoprolol succinate (TOPROL-XL) 50 MG 24 hr tablet Take 1 tablet (50 mg total) by mouth daily. Take with or immediately following a meal. 90 tablet 2   pantoprazole (PROTONIX) 40 MG tablet Take 1 tablet (40 mg total) by mouth daily. 90 tablet 3   PFIZER-BIONTECH COVID-19 VACC 30 MCG/0.3ML injection      predniSONE (DELTASONE) 10 MG tablet 3 tabs by mouth per day for 3 days,2tabs per day for 3 days,1tab per day for 3 days 18 tablet 0   tamsulosin (FLOMAX) 0.4 MG CAPS capsule TAKE 1 CAPSULE(0.4 MG) BY MOUTH TWICE DAILY 180 capsule 3   tiZANidine (ZANAFLEX) 2 MG tablet Take 1 tablet (2 mg total) by mouth every 8 (eight) hours as needed for muscle spasms. 30 tablet 0   traMADol (ULTRAM) 50 MG tablet Take 1 tablet (50 mg total) by mouth every 6 (six) hours as needed. 120 tablet 2   triamcinolone (NASACORT) 55 MCG/ACT AERO nasal inhaler Place 2 sprays into the nose daily. 1 each 12   vitamin B-12 (CYANOCOBALAMIN) 1000 MCG tablet Take 1 tablet (1,000 mcg total) by mouth daily. 90 tablet 1   cetirizine (ZYRTEC) 10 MG tablet Take 1 tablet (10 mg total) by mouth daily. 30 tablet 11   levocetirizine (XYZAL) 5 MG tablet Take 1 tablet (5 mg total) by mouth every evening. 30 tablet 11   No current facility-administered medications on file prior to visit.        ROS:  All others reviewed and negative.  Objective        PE:  BP 138/68 (BP Location: Left Arm, Patient Position: Sitting, Cuff Size: Large)   Pulse 60   Temp 97.6 F (36.4 C) (Temporal)   Ht 5\' 9"  (1.753 m)   Wt 278 lb (126.1 kg)   SpO2 97%   BMI 41.05 kg/m                 Constitutional: Pt appears in NAD               HENT: Head: NCAT.                Right Ear: External ear normal.                 Left Ear: External ear normal.  Bilat tm's with mild erythema.  Max sinus areas non tender.  Pharynx with mild erythema, no exudate  Eyes: . Pupils are equal, round, and reactive to light. Conjunctivae and EOM are normal               Nose: without d/c or deformity               Neck: Neck supple. Gross normal ROM               Cardiovascular: Normal rate and regular rhythm.                 Pulmonary/Chest: Effort normal and breath sounds without rales or wheezing.                Abd:  Soft, NT, ND, + BS, no organomegaly               Neurological: Pt is alert. At baseline orientation, motor grossly intact               Skin: Skin is warm. No rashes, no other new lesions, LE edema - none               Psychiatric: Pt behavior is normal without agitation   Micro: none  Cardiac tracings I have personally interpreted today:  none  Pertinent Radiological findings (summarize): none   Lab Results  Component Value Date   WBC 5.5 10/04/2021   HGB 12.0 (L) 10/04/2021   HCT 37.8 (L) 10/04/2021   PLT 212.0 10/04/2021   GLUCOSE 67 (L) 10/04/2021   CHOL 145 10/04/2021   TRIG 79.0 10/04/2021   HDL 46.50 10/04/2021   LDLDIRECT 156.9 10/10/2010   LDLCALC 83 10/04/2021   ALT 15 10/04/2021   AST 16 10/04/2021   NA 144 10/04/2021   K 3.7 10/04/2021   CL 108 10/04/2021   CREATININE 2.41 (H) 10/04/2021   BUN 39 (H) 10/04/2021   CO2 32 10/04/2021   TSH 2.67 10/04/2021   PSA 0.96 10/04/2021   HGBA1C 6.0 10/04/2021   Assessment/Plan:  JERYN CERNEY is a 76 y.o. Black or African American [2] male with  has a past medical history of ALLERGIC RHINITIS (04/09/2007), Allergy, ASTHMA (12/04/2007), BACK PAIN (09/16/2009), BENIGN PROSTATIC HYPERTROPHY (04/09/2007), CEREBROVASCULAR ACCIDENT, HX OF (07/17/2010), COLONIC POLYPS, HX OF (12/04/2007), CONSTIPATION (09/25/2010), Degeneration of cervical intervertebral disc (03/28/2007), DEPRESSION (12/04/2007), DIVERTICULOSIS, COLON (12/04/2007), DYSPHAGIA UNSPECIFIED (03/16/2008), ERECTILE  DYSFUNCTION (04/09/2007), ESOPHAGEAL STRICTURE (05/26/2008), GERD (12/04/2007), HEMORRHOIDS, RECURRENT (02/11/2008), HYPERLIPIDEMIA (12/04/2007), HYPERTENSION (03/28/2007), LEG PAIN, BILATERAL (01/25/2010), LOW BACK PAIN (04/09/2007), LUNG NODULE (12/04/2007), Osteoarth NOS-Unspec (03/28/2007), Other dysphagia (11/21/2009), Peripheral neuropathy (05/29/2018), POLYARTHRALGIA (07/13/2008), Polymyalgia rheumatica ([place]) (07/26/2008), PVD WITH CLAUDICATION (02/10/2010), RENAL INSUFFICIENCY (03/15/2010), Sleep apnea, SLEEP APNEA, OBSTRUCTIVE, MODERATE (04/09/2007), Stroke ([place]), and TB SKIN TEST, POSITIVE (03/28/2007).  B12 deficiency Lab Results  Component Value Date   VITAMINB12 1,163 (H) 10/04/2021   Stable, cont oral replacement - b12 1000 mcg qd   Vitamin D deficiency Last vitamin D Lab Results  Component Value Date   VD25OH 50.14 10/04/2021   Stable, cont oral replacement   LOW BACK PAIN Chronic stable, but ok for handicapped parking application signed today  Allergic rhinitis Uncontrolled with seasonal flare, to retart zyrtec and nasacort asd, Declines depomedrol IM today  CKD (chronic kidney disease) stage 4, GFR 15-29 ml/min (HCC) Lab Results  Component Value Date   CREATININE 2.41 (H) 10/04/2021   Stable overall and had f/u labs 1 wk ago per renal, cont to avoid nephrotoxins, f/u renal as planned  Hypercholesterolemia Lab Results  Component Value Date  [place] 83 10/04/2021   Uncontrolled on high dose statin lipitor 80 mg, pt to continue current statin and for lower chol diet, as o/w declines any change   Impaired glucose tolerance Lab Results  Component Value Date   HGBA1C 6.0 10/04/2021   Stable, pt to continue current medical treatment  - diet, wt control, excercise  Followup: Return in about 6 months (around 10/04/2022).  Cathlean Cower, MD 04/03/2022 2:12 PM [facility]

## 2022-04-03 NOTE — Assessment & Plan Note (Signed)
Lab Results  Component Value Date   HGBA1C 6.0 10/04/2021   Stable, pt to continue current medical treatment  - diet, wt control, excercise  

## 2022-04-03 NOTE — Patient Instructions (Addendum)
Please continue all other medications as before, including your OTC allergy medications  Please have the pharmacy call with any other refills you may need.  Please continue your efforts at being more active, low cholesterol diet, and weight control.  Please keep your appointments with your specialists as you may have planned - kidney doctor  We can hold on Lab testing today  You are given the handicapped parking application signed today  Please make an Appointment to return in 6 months, or sooner if needed, also with Lab Appointment for testing done 3-5 days before at the [facility] (so this is for TWO appointments - please see the scheduling desk as you leave)

## 2022-04-05 ENCOUNTER — Telehealth: Payer: Self-pay

## 2022-04-05 NOTE — Telephone Encounter (Signed)
Ok sure but I dont recall seeing this paperwork  thanks

## 2022-04-05 NOTE — Telephone Encounter (Signed)
Troy Palmer is needing a signature for pts paperwork that was sent on 04/02/2022 from [facility] with a total of 3 pages.  Please fax back to [phone].Marland Kitchen ATTNClifton Palmer

## 2022-04-09 ENCOUNTER — Telehealth: Payer: Self-pay | Admitting: Internal Medicine

## 2022-04-09 NOTE — Telephone Encounter (Signed)
Caller & Relationship to patient: [place] - self  Call back number: [phone]  Date of last office visit: 04-03-22  Date of next office visit: Due Feb 2024  Medication(s) to be refilled: amLODipine (NORVASC) 5 MG tablet  Preferred Pharmacy: [facility] [place], [place] - [place] AT [place]

## 2022-04-10 MED ORDER — AMLODIPINE BESYLATE 5 MG PO TABS: ORAL_TABLET | ORAL | 1 refills | Status: DC

## 2022-04-10 NOTE — Telephone Encounter (Signed)
Rx sent 

## 2022-04-12 NOTE — Telephone Encounter (Signed)
If caller calls back, please get his last name and phone number where he can be reached as the paperwork has not been found.

## 2022-04-17 NOTE — Telephone Encounter (Signed)
Form signed and faxed to [place].

## 2022-07-09 DIAGNOSIS — H18513 Endothelial corneal dystrophy, bilateral: Secondary | ICD-10-CM | POA: Diagnosis not present

## 2022-07-10 ENCOUNTER — Encounter: Payer: Self-pay | Admitting: Internal Medicine

## 2022-07-10 ENCOUNTER — Ambulatory Visit (INDEPENDENT_AMBULATORY_CARE_PROVIDER_SITE_OTHER): Payer: Medicare PPO | Admitting: Internal Medicine

## 2022-07-10 VITALS — BP 134/68 | HR 63 | Temp 97.5°F | Ht 69.0 in | Wt 275.0 lb

## 2022-07-10 DIAGNOSIS — R7302 Impaired glucose tolerance (oral): Secondary | ICD-10-CM | POA: Diagnosis not present

## 2022-07-10 DIAGNOSIS — E559 Vitamin D deficiency, unspecified: Secondary | ICD-10-CM | POA: Diagnosis not present

## 2022-07-10 DIAGNOSIS — Z125 Encounter for screening for malignant neoplasm of prostate: Secondary | ICD-10-CM

## 2022-07-10 DIAGNOSIS — R21 Rash and other nonspecific skin eruption: Secondary | ICD-10-CM | POA: Insufficient documentation

## 2022-07-10 DIAGNOSIS — E7800 Pure hypercholesterolemia, unspecified: Secondary | ICD-10-CM

## 2022-07-10 DIAGNOSIS — E538 Deficiency of other specified B group vitamins: Secondary | ICD-10-CM | POA: Diagnosis not present

## 2022-07-10 LAB — CBC WITH DIFFERENTIAL/PLATELET
Basophils Absolute: 0 10*3/uL (ref 0.0–0.1)
Basophils Relative: 0.7 % (ref 0.0–3.0)
Eosinophils Absolute: 0.2 10*3/uL (ref 0.0–0.7)
Eosinophils Relative: 4.6 % (ref 0.0–5.0)
HCT: 39.1 % (ref 39.0–52.0)
Hemoglobin: 12.4 g/dL — ABNORMAL LOW (ref 13.0–17.0)
Lymphocytes Relative: 29 % (ref 12.0–46.0)
Lymphs Abs: 1.3 10*3/uL (ref 0.7–4.0)
MCHC: 31.8 g/dL (ref 30.0–36.0)
MCV: 85.6 fl (ref 78.0–100.0)
Monocytes Absolute: 0.8 10*3/uL (ref 0.1–1.0)
Monocytes Relative: 17.7 % — ABNORMAL HIGH (ref 3.0–12.0)
Neutro Abs: 2.1 10*3/uL (ref 1.4–7.7)
Neutrophils Relative %: 48 % (ref 43.0–77.0)
Platelets: 240 10*3/uL (ref 150.0–400.0)
RBC: 4.57 Mil/uL (ref 4.22–5.81)
RDW: 15.5 % (ref 11.5–15.5)
WBC: 4.4 10*3/uL (ref 4.0–10.5)

## 2022-07-10 LAB — URINALYSIS, ROUTINE W REFLEX MICROSCOPIC
Bilirubin Urine: NEGATIVE
Hgb urine dipstick: NEGATIVE
Ketones, ur: NEGATIVE
Leukocytes,Ua: NEGATIVE
Nitrite: NEGATIVE
RBC / HPF: NONE SEEN (ref 0–?)
Specific Gravity, Urine: 1.01 (ref 1.000–1.030)
Total Protein, Urine: NEGATIVE
Urine Glucose: NEGATIVE
Urobilinogen, UA: 0.2 (ref 0.0–1.0)
pH: 6 (ref 5.0–8.0)

## 2022-07-10 LAB — BASIC METABOLIC PANEL
BUN: 32 mg/dL — ABNORMAL HIGH (ref 6–23)
CO2: 31 mEq/L (ref 19–32)
Calcium: 8.9 mg/dL (ref 8.4–10.5)
Chloride: 102 mEq/L (ref 96–112)
Creatinine, Ser: 2.32 mg/dL — ABNORMAL HIGH (ref 0.40–1.50)
GFR: 26.72 mL/min — ABNORMAL LOW (ref >60.00)
Glucose, Bld: 97 mg/dL (ref 70–99)
Potassium: 4.2 mEq/L (ref 3.5–5.1)
Sodium: 140 mEq/L (ref 135–145)

## 2022-07-10 LAB — MICROALBUMIN / CREATININE URINE RATIO
Creatinine,U: 145.7 mg/dL
Microalb Creat Ratio: 7.8 mg/g (ref 0.0–30.0)
Microalb, Ur: 11.4 mg/dL — ABNORMAL HIGH (ref 0.0–1.9)

## 2022-07-10 LAB — VITAMIN D 25 HYDROXY (VIT D DEFICIENCY, FRACTURES): VITD: 57.97 ng/mL (ref 30.00–100.00)

## 2022-07-10 LAB — HEPATIC FUNCTION PANEL
ALT: 15 U/L (ref 0–53)
AST: 19 U/L (ref 0–37)
Albumin: 3.7 g/dL (ref 3.5–5.2)
Alkaline Phosphatase: 119 U/L — ABNORMAL HIGH (ref 39–117)
Bilirubin, Direct: 0.1 mg/dL (ref 0.0–0.3)
Total Bilirubin: 0.4 mg/dL (ref 0.2–1.2)
Total Protein: 6.7 g/dL (ref 6.0–8.3)

## 2022-07-10 LAB — LIPID PANEL
Cholesterol: 159 mg/dL (ref 0–200)
HDL: 42.8 mg/dL (ref >39.00)
LDL Cholesterol: 104 mg/dL — ABNORMAL HIGH (ref 0–99)
NonHDL: 116.28
Total CHOL/HDL Ratio: 4
Triglycerides: 63 mg/dL (ref 0.0–149.0)
VLDL: 12.6 mg/dL (ref 0.0–40.0)

## 2022-07-10 LAB — TSH: TSH: 1.9 u[IU]/mL (ref 0.35–5.50)

## 2022-07-10 LAB — VITAMIN B12: Vitamin B-12: 833 pg/mL (ref 211–911)

## 2022-07-10 LAB — PSA: PSA: 0.97 ng/mL (ref 0.10–4.00)

## 2022-07-10 LAB — HEMOGLOBIN A1C: Hgb A1c MFr Bld: 6 % (ref 4.6–6.5)

## 2022-07-10 MED ORDER — FLUCONAZOLE 100 MG PO TABS: 100.0000 mg | ORAL_TABLET | Freq: Every day | ORAL | 0 refills | Status: DC

## 2022-07-10 MED ORDER — CLOTRIMAZOLE-BETAMETHASONE 1-0.05 % EX CREA: 1.0000 | TOPICAL_CREAM | Freq: Every day | CUTANEOUS | 1 refills | Status: DC

## 2022-07-10 NOTE — Assessment & Plan Note (Signed)
Lab Results  Component Value Date   HGBA1C 6.0 10/04/2021   Stable, pt to continue current medical treatment  - diet, wt control, excercise  

## 2022-07-10 NOTE — Progress Notes (Signed)
Patient ID: Troy Palmer, male   DOB: [birth year], 76 y.o.   MRN: [id number]        Chief Complaint: follow up rash, dm, hld, low vit d and b12       HPI:  Troy Palmer is a 76 y.o. male here with c/o 2 wks onset worsening spreadings random light areas of skin, most noticeable to both forearms but also to trunk back and legs as well.  No itching or paiin or fever.  Pt states has hx of RA but seems to be meaning his hx of PMR.  No joint pain or swelling.  Pt denies chest pain, increased sob or doe, wheezing, orthopnea, PND, increased LE swelling, palpitations, dizziness or syncope.   Pt denies polydipsia, polyuria, or new focal neuro s/s.    Pt denies fever, wt loss, night sweats, loss of appetite, or other constitutional symptoms        Wt Readings from Last 3 Encounters:  07/10/22 275 lb (124.7 kg)  04/03/22 278 lb (126.1 kg)  12/20/21 275 lb (124.7 kg)   BP Readings from Last 3 Encounters:  07/10/22 134/68  04/03/22 138/68  12/20/21 124/68         Past Medical History:  Diagnosis Date   ALLERGIC RHINITIS 04/09/2007   Allergy    ASTHMA 12/04/2007   BACK PAIN 09/16/2009   BENIGN PROSTATIC HYPERTROPHY 04/09/2007   CEREBROVASCULAR ACCIDENT, HX OF 07/17/2010   COLONIC POLYPS, HX OF 12/04/2007   CONSTIPATION 09/25/2010   Degeneration of cervical intervertebral disc 03/28/2007   DEPRESSION 12/04/2007   DIVERTICULOSIS, COLON 12/04/2007   DYSPHAGIA UNSPECIFIED 03/16/2008   ERECTILE DYSFUNCTION 04/09/2007   ESOPHAGEAL STRICTURE 05/26/2008   GERD 12/04/2007   HEMORRHOIDS, RECURRENT 02/11/2008   HYPERLIPIDEMIA 12/04/2007   HYPERTENSION 03/28/2007   LEG PAIN, BILATERAL 01/25/2010   LOW BACK PAIN 04/09/2007   LUNG NODULE 12/04/2007   Osteoarth NOS-Unspec 03/28/2007   Other dysphagia 11/21/2009   Peripheral neuropathy 05/29/2018   POLYARTHRALGIA 07/13/2008   Polymyalgia rheumatica ([place]) 07/26/2008   PVD WITH CLAUDICATION 02/10/2010   RENAL INSUFFICIENCY 03/15/2010   Sleep apnea    no cpap    SLEEP  APNEA, OBSTRUCTIVE, MODERATE 04/09/2007   Stroke ([place])    TIA    TB SKIN TEST, POSITIVE 03/28/2007   Past Surgical History:  Procedure Laterality Date   COLONOSCOPY     ROTATOR CUFF REPAIR Bilateral    TOTAL KNEE ARTHROPLASTY     x 2    reports that he has quit smoking. He has never used smokeless tobacco. He reports that he does not drink alcohol and does not use drugs. family history includes Asthma in an other family member; Healthy in his mother; Heart disease in his father; Hypertension in his brother; Lupus in his brother. Allergies  Allergen Reactions   Ace Inhibitors Swelling    Angioedema throat   Augmentin [Amoxicillin-Pot Clavulanate] Other (See Comments)    Marked weakness with po med x 3 doses   Doxycycline     Some GI upset   Sulfa Antibiotics Hives    And facial angioedema   Current Outpatient Medications on File Prior to Visit  Medication Sig Dispense Refill   acetaminophen (TYLENOL) 650 MG CR tablet Take 1 tablet (650 mg total) by mouth every 6 (six) hours as needed (back pain). 30 tablet 0   allopurinol (ZYLOPRIM) 100 MG tablet TAKE 1 TABLET BY MOUTH ONCE DAILY 90 tablet 1   amLODipine (NORVASC) 5 MG tablet  TAKE 1 TABLET(5 MG) BY MOUTH DAILY 90 tablet 1   atorvastatin (LIPITOR) 80 MG tablet Take 1 tablet (80 mg total) by mouth daily. 90 tablet 3   BYSTOLIC 20 MG TABS Take 1 tablet by mouth daily.     Cholecalciferol 50 MCG (2000 UT) TABS 1 tab by mouth once daily 30 tablet 99   colchicine 0.6 MG tablet Take 1 tablet (0.6 mg total) by mouth daily. For gout pain 30 tablet 1   furosemide (LASIX) 40 MG tablet TAKE 1 AND 1/2 TABLETS(60 MG) BY MOUTH TWICE DAILY 270 tablet 2   hydrALAZINE (APRESOLINE) 25 MG tablet Take 1 tablet (25 mg total) by mouth in the morning and at bedtime. 180 tablet 3   lidocaine (LIDODERM) 5 % Place 1 patch onto the skin daily. Apply patch to the left shoulder area. Remove & Discard patch within 12 hours or as directed by MD 14 patch 0    metoprolol succinate (TOPROL-XL) 50 MG 24 hr tablet Take 1 tablet (50 mg total) by mouth daily. Take with or immediately following a meal. 90 tablet 2   pantoprazole (PROTONIX) 40 MG tablet Take 1 tablet (40 mg total) by mouth daily. 90 tablet 3   PFIZER-BIONTECH COVID-19 VACC 30 MCG/0.3ML injection      tamsulosin (FLOMAX) 0.4 MG CAPS capsule TAKE 1 CAPSULE(0.4 MG) BY MOUTH TWICE DAILY 180 capsule 3   tiZANidine (ZANAFLEX) 2 MG tablet Take 1 tablet (2 mg total) by mouth every 8 (eight) hours as needed for muscle spasms. 30 tablet 0   traMADol (ULTRAM) 50 MG tablet Take 1 tablet (50 mg total) by mouth every 6 (six) hours as needed. 120 tablet 2   triamcinolone (NASACORT) 55 MCG/ACT AERO nasal inhaler Place 2 sprays into the nose daily. 1 each 12   vitamin B-12 (CYANOCOBALAMIN) 1000 MCG tablet Take 1 tablet (1,000 mcg total) by mouth daily. 90 tablet 1   cetirizine (ZYRTEC) 10 MG tablet Take 1 tablet (10 mg total) by mouth daily. 30 tablet 11   levocetirizine (XYZAL) 5 MG tablet Take 1 tablet (5 mg total) by mouth every evening. 30 tablet 11   No current facility-administered medications on file prior to visit.        ROS:  All others reviewed and negative.  Objective        PE:  BP 134/68 (BP Location: Right Arm, Patient Position: Sitting, Cuff Size: Large)   Pulse 63   Temp (!) 97.5 F (36.4 C) (Oral)   Ht 5\' 9"  (1.753 m)   Wt 275 lb (124.7 kg)   SpO2 96%   BMI 40.61 kg/m                 Constitutional: Pt appears in NAD               HENT: Head: NCAT.                Right Ear: External ear normal.                 Left Ear: External ear normal.                Eyes: . Pupils are equal, round, and reactive to light. Conjunctivae and EOM are normal               Nose: without d/c or deformity               Neck: Neck supple. Gross normal ROM  Cardiovascular: Normal rate and regular rhythm.                 Pulmonary/Chest: Effort normal and breath sounds without rales or  wheezing.                               Neurological: Pt is alert. At baseline orientation, motor grossly intact               Skin: Skin is warm. LE edema - trace bilateral, has numerous nontender light skin areas mostly oval to forearms, trunk, back and legs               Psychiatric: Pt behavior is normal without agitation   Micro: none  Cardiac tracings I have personally interpreted today:  none  Pertinent Radiological findings (summarize): none   Lab Results  Component Value Date   WBC 5.5 10/04/2021   HGB 12.0 (L) 10/04/2021   HCT 37.8 (L) 10/04/2021   PLT 212.0 10/04/2021   GLUCOSE 67 (L) 10/04/2021   CHOL 145 10/04/2021   TRIG 79.0 10/04/2021   HDL 46.50 10/04/2021   LDLDIRECT 156.9 10/10/2010   LDLCALC 83 10/04/2021   ALT 15 10/04/2021   AST 16 10/04/2021   NA 144 10/04/2021   K 3.7 10/04/2021   CL 108 10/04/2021   CREATININE 2.41 (H) 10/04/2021   BUN 39 (H) 10/04/2021   CO2 32 10/04/2021   TSH 2.67 10/04/2021   PSA 0.96 10/04/2021   HGBA1C 6.0 10/04/2021   Assessment/Plan:  Troy Palmer is a 76 y.o. Black or African American [2] male with  has a past medical history of ALLERGIC RHINITIS (04/09/2007), Allergy, ASTHMA (12/04/2007), BACK PAIN (09/16/2009), BENIGN PROSTATIC HYPERTROPHY (04/09/2007), CEREBROVASCULAR ACCIDENT, HX OF (07/17/2010), COLONIC POLYPS, HX OF (12/04/2007), CONSTIPATION (09/25/2010), Degeneration of cervical intervertebral disc (03/28/2007), DEPRESSION (12/04/2007), DIVERTICULOSIS, COLON (12/04/2007), DYSPHAGIA UNSPECIFIED (03/16/2008), ERECTILE DYSFUNCTION (04/09/2007), ESOPHAGEAL STRICTURE (05/26/2008), GERD (12/04/2007), HEMORRHOIDS, RECURRENT (02/11/2008), HYPERLIPIDEMIA (12/04/2007), HYPERTENSION (03/28/2007), LEG PAIN, BILATERAL (01/25/2010), LOW BACK PAIN (04/09/2007), LUNG NODULE (12/04/2007), Osteoarth NOS-Unspec (03/28/2007), Other dysphagia (11/21/2009), Peripheral neuropathy (05/29/2018), POLYARTHRALGIA (07/13/2008), Polymyalgia rheumatica ([place]) (07/26/2008),  PVD WITH CLAUDICATION (02/10/2010), RENAL INSUFFICIENCY (03/15/2010), Sleep apnea, SLEEP APNEA, OBSTRUCTIVE, MODERATE (04/09/2007), Stroke ([place]), and TB SKIN TEST, POSITIVE (03/28/2007).  B12 deficiency Lab Results  Component Value Date   VITAMINB12 1,163 (H) 10/04/2021   Stable, cont oral replacement - b12 1000 mcg qd   Vitamin D deficiency Last vitamin D Lab Results  Component Value Date   VD25OH 50.14 10/04/2021   Stable, cont oral replacement   Rash This is c/w tinea versicolor - for topical lotrisone prn, also diflucan 100 mg qd x 7 days  Impaired glucose tolerance Lab Results  Component Value Date   HGBA1C 6.0 10/04/2021   Stable, pt to continue current medical treatment  - diet, wt control, excercise   Hypercholesterolemia Lab Results  Component Value Date   LDLCALC 83 10/04/2021   Uncontrolled, goal ldl < 70,, pt to continue current statin lipitor 80 mg, declines change such as add zetia  Followup: Return in about 6 months (around 01/08/2023).  Troy Cower, MD 07/10/2022 1:07 PM [facility]

## 2022-07-10 NOTE — Assessment & Plan Note (Signed)
Lab Results  Component Value Date   LDLCALC 83 10/04/2021   Uncontrolled, goal ldl < 70,, pt to continue current statin lipitor 80 mg, declines change such as add zetia

## 2022-07-10 NOTE — Patient Instructions (Signed)
Please take all new medication as prescribed -the cream, and the pills for skin fungus infectoin  Please continue all other medications as before, and refills have been done if requested.  Please have the pharmacy call with any other refills you may need.  Please continue your efforts at being more active, low cholesterol diet, and weight control.  Please keep your appointments with your specialists as you may have planned  Please go to the LAB at the blood drawing area for the tests to be done  You will be contacted by phone if any changes need to be made immediately.  Otherwise, you will receive a letter about your results with an explanation, but please check with MyChart first.  Please remember to sign up for MyChart if you have not done so, as this will be important to you in the future with finding out test results, communicating by private email, and scheduling acute appointments online when needed.  Please make an Appointment to return in 6 months, or sooner if needed, also with Lab Appointment for testing done 3-5 days before at the [facility] (so this is for TWO appointments - please see the scheduling desk as you leave)

## 2022-07-10 NOTE — Assessment & Plan Note (Signed)
Last vitamin D Lab Results  Component Value Date   VD25OH 50.14 10/04/2021   Stable, cont oral replacement

## 2022-07-10 NOTE — Assessment & Plan Note (Signed)
This is c/w tinea versicolor - for topical lotrisone prn, also diflucan 100 mg qd x 7 days

## 2022-07-10 NOTE — Assessment & Plan Note (Signed)
Lab Results  Component Value Date   VITAMINB12 1,163 (H) 10/04/2021   Stable, cont oral replacement - b12 1000 mcg qd

## 2022-07-13 DIAGNOSIS — I10 Essential (primary) hypertension: Secondary | ICD-10-CM | POA: Diagnosis not present

## 2022-08-07 ENCOUNTER — Telehealth: Payer: Self-pay | Admitting: Internal Medicine

## 2022-08-07 NOTE — Telephone Encounter (Signed)
Lvm informing patient that we had to re-scheduled his AWV with [facility] on 08/08/22 to 08/31/22.

## 2022-08-08 ENCOUNTER — Ambulatory Visit: Payer: Medicare PPO

## 2022-08-20 DIAGNOSIS — Z6841 Body Mass Index (BMI) 40.0 and over, adult: Secondary | ICD-10-CM | POA: Diagnosis not present

## 2022-08-20 DIAGNOSIS — I129 Hypertensive chronic kidney disease with stage 1 through stage 4 chronic kidney disease, or unspecified chronic kidney disease: Secondary | ICD-10-CM | POA: Diagnosis not present

## 2022-08-20 DIAGNOSIS — N183 Chronic kidney disease, stage 3 unspecified: Secondary | ICD-10-CM | POA: Diagnosis not present

## 2022-08-20 DIAGNOSIS — M109 Gout, unspecified: Secondary | ICD-10-CM | POA: Diagnosis not present

## 2022-08-20 DIAGNOSIS — E669 Obesity, unspecified: Secondary | ICD-10-CM | POA: Diagnosis not present

## 2022-08-28 ENCOUNTER — Other Ambulatory Visit: Payer: Self-pay | Admitting: Internal Medicine

## 2022-08-28 NOTE — Telephone Encounter (Signed)
Please refill as per office routine med refill policy (all routine meds to be refilled for 3 mo or monthly (per pt preference) up to one year from last visit, then month to month grace period for 3 mo, then further med refills will have to be denied)

## 2022-08-30 ENCOUNTER — Ambulatory Visit (INDEPENDENT_AMBULATORY_CARE_PROVIDER_SITE_OTHER): Payer: Medicare PPO | Admitting: Internal Medicine

## 2022-08-30 VITALS — BP 132/72 | HR 63 | Temp 97.8°F | Ht 69.0 in | Wt 276.0 lb

## 2022-08-30 DIAGNOSIS — R21 Rash and other nonspecific skin eruption: Secondary | ICD-10-CM | POA: Diagnosis not present

## 2022-08-30 DIAGNOSIS — N184 Chronic kidney disease, stage 4 (severe): Secondary | ICD-10-CM | POA: Diagnosis not present

## 2022-08-30 DIAGNOSIS — I10 Essential (primary) hypertension: Secondary | ICD-10-CM | POA: Diagnosis not present

## 2022-08-30 DIAGNOSIS — R7302 Impaired glucose tolerance (oral): Secondary | ICD-10-CM

## 2022-08-30 NOTE — Progress Notes (Signed)
Patient ID: Troy Palmer, male   DOB: 1945-09-21, 77 y.o.   MRN: [id number]

## 2022-08-30 NOTE — Progress Notes (Signed)
Patient ID: Troy Palmer, male   DOB: May 24, 1946, 77 y.o.   MRN: [id number]        Chief Complaint: follow up worsening rash to bilat hands arms getting worse, htn, hyperglycemia, ckd       HPI:  Troy Palmer is a 77 y.o. male here with c/o worsening rash since last visit; had little to no improvement with prior antifungal topical and oral diflucan course; now just getting worse extensive to the blateral post hands and arms below the elbows, no ithing or pain, just getting worse in area involved.  Pt denies chest pain, increased sob or doe, wheezing, orthopnea, PND, increased LE swelling, palpitations, dizziness or syncope.   Pt denies polydipsia, polyuria, or new focal neuro s/s.    Pt denies fever, wt loss, night sweats, loss of appetite, or other constitutional symptoms         Wt Readings from Last 3 Encounters:  08/31/22 275 lb (124.7 kg)  08/30/22 276 lb (125.2 kg)  07/10/22 275 lb (124.7 kg)   BP Readings from Last 3 Encounters:  08/30/22 132/72  07/10/22 134/68  04/03/22 138/68         Past Medical History:  Diagnosis Date   ALLERGIC RHINITIS 04/09/2007   Allergy    ASTHMA 12/04/2007   BACK PAIN 09/16/2009   BENIGN PROSTATIC HYPERTROPHY 04/09/2007   CEREBROVASCULAR ACCIDENT, HX OF 07/17/2010   COLONIC POLYPS, HX OF 12/04/2007   CONSTIPATION 09/25/2010   Degeneration of cervical intervertebral disc 03/28/2007   DEPRESSION 12/04/2007   DIVERTICULOSIS, COLON 12/04/2007   DYSPHAGIA UNSPECIFIED 03/16/2008   ERECTILE DYSFUNCTION 04/09/2007   ESOPHAGEAL STRICTURE 05/26/2008   GERD 12/04/2007   HEMORRHOIDS, RECURRENT 02/11/2008   HYPERLIPIDEMIA 12/04/2007   HYPERTENSION 03/28/2007   LEG PAIN, BILATERAL 01/25/2010   LOW BACK PAIN 04/09/2007   LUNG NODULE 12/04/2007   Osteoarth NOS-Unspec 03/28/2007   Other dysphagia 11/21/2009   Peripheral neuropathy 05/29/2018   POLYARTHRALGIA 07/13/2008   Polymyalgia rheumatica ([place]) 07/26/2008   PVD WITH CLAUDICATION 02/10/2010   RENAL INSUFFICIENCY  03/15/2010   Sleep apnea    no cpap    SLEEP APNEA, OBSTRUCTIVE, MODERATE 04/09/2007   Stroke ([place])    TIA    TB SKIN TEST, POSITIVE 03/28/2007   Past Surgical History:  Procedure Laterality Date   COLONOSCOPY     ROTATOR CUFF REPAIR Bilateral    TOTAL KNEE ARTHROPLASTY     x 2    reports that he has quit smoking. He has never used smokeless tobacco. He reports that he does not drink alcohol and does not use drugs. family history includes Asthma in an other family member; Healthy in his mother; Heart disease in his father; Hypertension in his brother; Lupus in his brother. Allergies  Allergen Reactions   Ace Inhibitors Swelling    Angioedema throat   Augmentin [Amoxicillin-Pot Clavulanate] Other (See Comments)    Marked weakness with po med x 3 doses   Doxycycline     Some GI upset   Sulfa Antibiotics Hives    And facial angioedema   Current Outpatient Medications on File Prior to Visit  Medication Sig Dispense Refill   acetaminophen (TYLENOL) 650 MG CR tablet Take 1 tablet (650 mg total) by mouth every 6 (six) hours as needed (back pain). 30 tablet 0   allopurinol (ZYLOPRIM) 100 MG tablet TAKE 1 TABLET BY MOUTH ONCE DAILY 90 tablet 1   amLODipine (NORVASC) 5 MG tablet TAKE 1 TABLET(5 MG) BY MOUTH  DAILY 90 tablet 1   atorvastatin (LIPITOR) 80 MG tablet Take 1 tablet (80 mg total) by mouth daily. 90 tablet 3   BYSTOLIC 20 MG TABS Take 1 tablet by mouth daily.     Cholecalciferol 50 MCG (2000 UT) TABS 1 tab by mouth once daily 30 tablet 99   clotrimazole-betamethasone (LOTRISONE) cream Apply 1 Application topically daily. 30 g 1   colchicine 0.6 MG tablet Take 1 tablet (0.6 mg total) by mouth daily. For gout pain 30 tablet 1   fluconazole (DIFLUCAN) 100 MG tablet Take 1 tablet (100 mg total) by mouth daily. 7 tablet 0   furosemide (LASIX) 40 MG tablet TAKE 1 AND 1/2 TABLETS(60 MG) BY MOUTH TWICE DAILY 270 tablet 2   hydrALAZINE (APRESOLINE) 25 MG tablet Take 1 tablet (25 mg total)  by mouth in the morning and at bedtime. 180 tablet 3   lidocaine (LIDODERM) 5 % Place 1 patch onto the skin daily. Apply patch to the left shoulder area. Remove & Discard patch within 12 hours or as directed by MD 14 patch 0   metoprolol succinate (TOPROL-XL) 50 MG 24 hr tablet Take 1 tablet (50 mg total) by mouth daily. Take with or immediately following a meal. 90 tablet 2   pantoprazole (PROTONIX) 40 MG tablet Take 1 tablet (40 mg total) by mouth daily. 90 tablet 3   PFIZER-BIONTECH COVID-19 VACC 30 MCG/0.3ML injection      tamsulosin (FLOMAX) 0.4 MG CAPS capsule TAKE 1 CAPSULE(0.4 MG) BY MOUTH TWICE DAILY 180 capsule 3   tiZANidine (ZANAFLEX) 2 MG tablet Take 1 tablet (2 mg total) by mouth every 8 (eight) hours as needed for muscle spasms. 30 tablet 0   traMADol (ULTRAM) 50 MG tablet Take 1 tablet (50 mg total) by mouth every 6 (six) hours as needed. 120 tablet 2   triamcinolone (NASACORT) 55 MCG/ACT AERO nasal inhaler Place 2 sprays into the nose daily. 1 each 12   vitamin B-12 (CYANOCOBALAMIN) 1000 MCG tablet Take 1 tablet (1,000 mcg total) by mouth daily. 90 tablet 1   cetirizine (ZYRTEC) 10 MG tablet Take 1 tablet (10 mg total) by mouth daily. 30 tablet 11   levocetirizine (XYZAL) 5 MG tablet Take 1 tablet (5 mg total) by mouth every evening. 30 tablet 11   No current facility-administered medications on file prior to visit.        ROS:  All others reviewed and negative.  Objective        PE:  BP 132/72 (BP Location: Right Arm, Patient Position: Sitting, Cuff Size: Large)   Pulse 63   Temp 97.8 F (36.6 C) (Oral)   Ht 5\' 9"  (1.753 m)   Wt 276 lb (125.2 kg)   SpO2 98%   BMI 40.76 kg/m                 Constitutional: Pt appears in NAD               HENT: Head: NCAT.                Right Ear: External ear normal.                 Left Ear: External ear normal.                Eyes: . Pupils are equal, round, and reactive to light. Conjunctivae and EOM are normal  Nose: without d/c or deformity               Neck: Neck supple. Gross normal ROM               Cardiovascular: Normal rate and regular rhythm.                 Pulmonary/Chest: Effort normal and breath sounds without rales or wheezing.                Abd:  Soft, NT, ND, + BS, no organomegaly               Neurological: Pt is alert. At baseline orientation, motor grossly intact               Skin: Skin is warm. LE edema - none, bilateral post hands and arms below the elbows with large area nontender erythema serpiginous with edges suggestive of ringworm               Psychiatric: Pt behavior is normal without agitation   Micro: none  Cardiac tracings I have personally interpreted today:  none  Pertinent Radiological findings (summarize): none   Lab Results  Component Value Date   WBC 4.4 07/10/2022   HGB 12.4 (L) 07/10/2022   HCT 39.1 07/10/2022   PLT 240.0 07/10/2022   GLUCOSE 97 07/10/2022   CHOL 159 07/10/2022   TRIG 63.0 07/10/2022   HDL 42.80 07/10/2022   LDLDIRECT 156.9 10/10/2010   LDLCALC 104 (H) 07/10/2022   ALT 15 07/10/2022   AST 19 07/10/2022   NA 140 07/10/2022   K 4.2 07/10/2022   CL 102 07/10/2022   CREATININE 2.32 (H) 07/10/2022   BUN 32 (H) 07/10/2022   CO2 31 07/10/2022   TSH 1.90 07/10/2022   PSA 0.97 07/10/2022   HGBA1C 6.0 07/10/2022   MICROALBUR 11.4 (H) 07/10/2022   Assessment/Plan:  Troy Palmer is a 77 y.o. Black or African American [2] male with  has a past medical history of ALLERGIC RHINITIS (04/09/2007), Allergy, ASTHMA (12/04/2007), BACK PAIN (09/16/2009), BENIGN PROSTATIC HYPERTROPHY (04/09/2007), CEREBROVASCULAR ACCIDENT, HX OF (07/17/2010), COLONIC POLYPS, HX OF (12/04/2007), CONSTIPATION (09/25/2010), Degeneration of cervical intervertebral disc (03/28/2007), DEPRESSION (12/04/2007), DIVERTICULOSIS, COLON (12/04/2007), DYSPHAGIA UNSPECIFIED (03/16/2008), ERECTILE DYSFUNCTION (04/09/2007), ESOPHAGEAL STRICTURE (05/26/2008), GERD (12/04/2007), HEMORRHOIDS,  RECURRENT (02/11/2008), HYPERLIPIDEMIA (12/04/2007), HYPERTENSION (03/28/2007), LEG PAIN, BILATERAL (01/25/2010), LOW BACK PAIN (04/09/2007), LUNG NODULE (12/04/2007), Osteoarth NOS-Unspec (03/28/2007), Other dysphagia (11/21/2009), Peripheral neuropathy (05/29/2018), POLYARTHRALGIA (07/13/2008), Polymyalgia rheumatica ([place]) (07/26/2008), PVD WITH CLAUDICATION (02/10/2010), RENAL INSUFFICIENCY (03/15/2010), Sleep apnea, SLEEP APNEA, OBSTRUCTIVE, MODERATE (04/09/2007), Stroke ([place]), and TB SKIN TEST, POSITIVE (03/28/2007).  Rash of both hands Exam suggestive of superficial fungal rash, but not improved with anti-fungal tx.  Will need Derm referral,   CKD (chronic kidney disease) stage 4, GFR 15-29 ml/min (HCC) Lab Results  Component Value Date   CREATININE 2.32 (H) 07/10/2022   Stable overall, cont to avoid nephrotoxins   Essential hypertension BP Readings from Last 3 Encounters:  08/30/22 132/72  07/10/22 134/68  04/03/22 138/68   Stable, pt to continue medical treatment norvasc 5 mg qd, bystolic 20 qd, hydralazine 25 bid, toprol xl 50 qd   Impaired glucose tolerance Lab Results  Component Value Date   HGBA1C 6.0 07/10/2022   Stable, pt to continue current medical treatment  - diet, wt control  Followup: Return in about 4 months (around 01/09/2023).  Cathlean Cower, MD 08/31/2022 9:43 PM [facility] -  [facility]

## 2022-08-30 NOTE — Patient Instructions (Signed)
Please continue all other medications as before, and refills have been done if requested.  Please have the pharmacy call with any other refills you may need.  Please keep your appointments with your specialists as you may have planned  You will be contacted regarding the referral for: Dermatology

## 2022-08-31 ENCOUNTER — Encounter: Payer: Self-pay | Admitting: Internal Medicine

## 2022-08-31 ENCOUNTER — Ambulatory Visit (INDEPENDENT_AMBULATORY_CARE_PROVIDER_SITE_OTHER): Payer: Medicare PPO

## 2022-08-31 VITALS — Ht 69.0 in | Wt 275.0 lb

## 2022-08-31 DIAGNOSIS — Z0000 Encounter for general adult medical examination without abnormal findings: Secondary | ICD-10-CM | POA: Diagnosis not present

## 2022-08-31 NOTE — Assessment & Plan Note (Signed)
BP Readings from Last 3 Encounters:  08/30/22 132/72  07/10/22 134/68  04/03/22 138/68   Stable, pt to continue medical treatment norvasc 5 mg qd, bystolic 20 qd, hydralazine 25 bid, toprol xl 50 qd

## 2022-08-31 NOTE — Progress Notes (Signed)
Virtual Visit via Telephone Note  I connected with  Troy Palmer on 08/31/22 at  2:30 PM EST by telephone and verified that I am speaking with the correct person using two identifiers.  Location: Patient: Home Provider: [place] Persons participating in the virtual visit: [place]   I discussed the limitations, risks, security and privacy concerns of performing an evaluation and management service by telephone and the availability of in person appointments. The patient expressed understanding and agreed to proceed.  Interactive audio and video telecommunications were attempted between this nurse and patient, however failed, due to patient having technical difficulties OR patient did not have access to video capability.  We continued and completed visit with audio only.  Some vital signs may be absent or patient reported.   Sheral Flow, LPN  Subjective:   Troy Palmer is a 77 y.o. male who presents for Medicare Annual/Subsequent preventive examination.  Review of Systems     Cardiac Risk Factors include: advanced age (>72men, >73 women);dyslipidemia;hypertension;male gender;obesity (BMI >30kg/m2);family history of premature cardiovascular disease;sedentary lifestyle     Objective:    Today's Vitals   08/31/22 1438  Weight: 275 lb (124.7 kg)  Height: 5\' 9"  (1.753 m)  PainSc: 7   PainLoc: Generalized   Body mass index is 40.61 kg/m.     08/31/2022    2:40 PM 03/01/2021    9:58 PM 05/10/2020   11:37 AM 02/26/2019   12:45 PM 02/24/2018    2:03 PM 02/09/2018    2:00 PM 08/22/2017   10:38 AM  Advanced Directives  Does Patient Have a Medical Advance Directive? No No No No No No No  Would patient like information on creating a medical advance directive? No - Patient declined No - Patient declined No - Patient declined No - Patient declined No - Patient declined No - Patient declined Yes (ED - Information included in AVS)    Current  Medications (verified) Outpatient Encounter Medications as of 08/31/2022  Medication Sig   acetaminophen (TYLENOL) 650 MG CR tablet Take 1 tablet (650 mg total) by mouth every 6 (six) hours as needed (back pain).   allopurinol (ZYLOPRIM) 100 MG tablet TAKE 1 TABLET BY MOUTH ONCE DAILY   amLODipine (NORVASC) 5 MG tablet TAKE 1 TABLET(5 MG) BY MOUTH DAILY   atorvastatin (LIPITOR) 80 MG tablet Take 1 tablet (80 mg total) by mouth daily.   BYSTOLIC 20 MG TABS Take 1 tablet by mouth daily.   cetirizine (ZYRTEC) 10 MG tablet Take 1 tablet (10 mg total) by mouth daily.   Cholecalciferol 50 MCG (2000 UT) TABS 1 tab by mouth once daily   clotrimazole-betamethasone (LOTRISONE) cream Apply 1 Application topically daily.   colchicine 0.6 MG tablet Take 1 tablet (0.6 mg total) by mouth daily. For gout pain   fluconazole (DIFLUCAN) 100 MG tablet Take 1 tablet (100 mg total) by mouth daily.   furosemide (LASIX) 40 MG tablet TAKE 1 AND 1/2 TABLETS(60 MG) BY MOUTH TWICE DAILY   hydrALAZINE (APRESOLINE) 25 MG tablet Take 1 tablet (25 mg total) by mouth in the morning and at bedtime.   levocetirizine (XYZAL) 5 MG tablet Take 1 tablet (5 mg total) by mouth every evening.   lidocaine (LIDODERM) 5 % Place 1 patch onto the skin daily. Apply patch to the left shoulder area. Remove & Discard patch within 12 hours or as directed by MD   metoprolol succinate (TOPROL-XL) 50 MG 24 hr tablet Take 1 tablet (  50 mg total) by mouth daily. Take with or immediately following a meal.   pantoprazole (PROTONIX) 40 MG tablet Take 1 tablet (40 mg total) by mouth daily.   PFIZER-BIONTECH COVID-19 VACC 30 MCG/0.3ML injection    tamsulosin (FLOMAX) 0.4 MG CAPS capsule TAKE 1 CAPSULE(0.4 MG) BY MOUTH TWICE DAILY   tiZANidine (ZANAFLEX) 2 MG tablet Take 1 tablet (2 mg total) by mouth every 8 (eight) hours as needed for muscle spasms.   traMADol (ULTRAM) 50 MG tablet Take 1 tablet (50 mg total) by mouth every 6 (six) hours as needed.    triamcinolone (NASACORT) 55 MCG/ACT AERO nasal inhaler Place 2 sprays into the nose daily.   vitamin B-12 (CYANOCOBALAMIN) 1000 MCG tablet Take 1 tablet (1,000 mcg total) by mouth daily.   No facility-administered encounter medications on file as of 08/31/2022.    Allergies (verified) Ace inhibitors, Augmentin [amoxicillin-pot clavulanate], Doxycycline, and Sulfa antibiotics   History: Past Medical History:  Diagnosis Date   ALLERGIC RHINITIS 04/09/2007   Allergy    ASTHMA 12/04/2007   BACK PAIN 09/16/2009   BENIGN PROSTATIC HYPERTROPHY 04/09/2007   CEREBROVASCULAR ACCIDENT, HX OF 07/17/2010   COLONIC POLYPS, HX OF 12/04/2007   CONSTIPATION 09/25/2010   Degeneration of cervical intervertebral disc 03/28/2007   DEPRESSION 12/04/2007   DIVERTICULOSIS, COLON 12/04/2007   DYSPHAGIA UNSPECIFIED 03/16/2008   ERECTILE DYSFUNCTION 04/09/2007   ESOPHAGEAL STRICTURE 05/26/2008   GERD 12/04/2007   HEMORRHOIDS, RECURRENT 02/11/2008   HYPERLIPIDEMIA 12/04/2007   HYPERTENSION 03/28/2007   LEG PAIN, BILATERAL 01/25/2010   LOW BACK PAIN 04/09/2007   LUNG NODULE 12/04/2007   Osteoarth NOS-Unspec 03/28/2007   Other dysphagia 11/21/2009   Peripheral neuropathy 05/29/2018   POLYARTHRALGIA 07/13/2008   Polymyalgia rheumatica ([place]) 07/26/2008   PVD WITH CLAUDICATION 02/10/2010   RENAL INSUFFICIENCY 03/15/2010   Sleep apnea    no cpap    SLEEP APNEA, OBSTRUCTIVE, MODERATE 04/09/2007   Stroke ([place])    TIA    TB SKIN TEST, POSITIVE 03/28/2007   Past Surgical History:  Procedure Laterality Date   COLONOSCOPY     ROTATOR CUFF REPAIR Bilateral    TOTAL KNEE ARTHROPLASTY     x 2   Family History  Problem Relation Age of Onset   Healthy Mother    Heart disease Father    Lupus Brother    Hypertension Brother    Asthma Other    Colon cancer Neg Hx    Colon polyps Neg Hx    Esophageal cancer Neg Hx    Rectal cancer Neg Hx    Stomach cancer Neg Hx    Social History   Socioeconomic History   Marital status:  Widowed    Spouse name: Not on file   Number of children: 6   Years of education: Not on file   Highest education level: Not on file  Occupational History   Occupation: light Engineer, maintenance  Tobacco Use   Smoking status: Former   Smokeless tobacco: Never  Scientific laboratory technician Use: Never used  Substance and Sexual Activity   Alcohol use: No   Drug use: No   Sexual activity: Not Currently  Other Topics Concern   Not on file  Social History Narrative   Patient does not get regular exercise.   6 children - 1 died with suicide, 1 with homicide      Lives with granddaugter and great grandson      Pt does have steps in the home  Highest level of education 12th grade      Disabled      Left handed   Social Determinants of Health   Financial Resource Strain: Low Risk  (08/31/2022)   Overall Financial Resource Strain (CARDIA)    Difficulty of Paying Living Expenses: Not hard at all  Food Insecurity: No Food Insecurity (08/31/2022)   Hunger Vital Sign    Worried About Running Out of Food in the Last Year: Never true    Ran Out of Food in the Last Year: Never true  Transportation Needs: No Transportation Needs (08/31/2022)   PRAPARE - Hydrologist (Medical): No    Lack of Transportation (Non-Medical): No  Physical Activity: Inactive (08/31/2022)   Exercise Vital Sign    Days of Exercise per Week: 0 days    Minutes of Exercise per Session: 0 min  Stress: No Stress Concern Present (08/31/2022)   [place]    Feeling of Stress : Not at all  Social Connections: Unknown (08/31/2022)   Social Connection and Isolation Panel [NHANES]    Frequency of Communication with Friends and Family: More than three times a week    Frequency of Social Gatherings with Friends and Family: More than three times a week    Attends Religious Services: Patient refused    Marine scientist or  Organizations: Patient refused    Attends Music therapist: Patient refused    Marital Status: Patient refused    Tobacco Counseling Counseling given: Not Answered   Clinical Intake:  Pre-visit preparation completed: Yes  Pain : 0-10 Pain Score: 7  Pain Type: Chronic pain Pain Location: Generalized Pain Onset: More than a month ago Pain Frequency: Constant     BMI - recorded: 40.61 Nutritional Status: BMI > 30  Obese Nutritional Risks: None Diabetes: No  How often do you need to have someone help you when you read instructions, pamphlets, or other written materials from your doctor or pharmacy?: 1 - Never What is the last grade level you completed in school?: HSG  Diabetic? No  Interpreter Needed?: No  Information entered by :: Lisette Abu, LPN.   Activities of Daily Living    08/31/2022    2:41 PM  In your present state of health, do you have any difficulty performing the following activities:  Hearing? 0  Vision? 0  Difficulty concentrating or making decisions? 0  Walking or climbing stairs? 0  Dressing or bathing? 0  Doing errands, shopping? 0  Preparing Food and eating ? N  Using the Toilet? N  In the past six months, have you accidently leaked urine? N  Do you have problems with loss of bowel control? N  Managing your Medications? N  Managing your Finances? N  Housekeeping or managing your Housekeeping? N    Patient Care Team: Biagio Borg, MD as PCP - [facility] as Consulting Physician (Optometry)  Indicate any recent [place] you may have received from other than [facility] providers in the past year (date may be approximate).     Assessment:   This is a routine wellness examination for [facility].  Hearing/Vision screen Hearing Screening - Comments:: Denies hearing difficulties   Vision Screening - Comments:: Wears rx glasses - up to date with routine eye exams with [facility]   Dietary issues and exercise  activities discussed: Current Exercise Habits: The patient does not participate in regular  exercise at present, Exercise limited by: orthopedic condition(s)   Goals Addressed             This Visit's Progress    Client understands the importance of follow-up with providers by attending scheduled visits.        Depression Screen    08/31/2022    2:41 PM 08/30/2022    3:14 PM 07/10/2022    9:47 AM 04/03/2022    1:41 PM 12/20/2021    2:15 PM 10/26/2021   10:52 AM 10/04/2021   10:41 AM  PHQ 2/9 Scores  PHQ - 2 Score 0 0 0  0 0 0  PHQ- 9 Score 0  0  0    Exception Documentation    Patient refusal       Fall Risk    08/31/2022    2:41 PM 08/30/2022    3:14 PM 07/10/2022    9:47 AM 12/20/2021    2:15 PM 10/26/2021   10:52 AM  Fall Risk   Falls in the past year? 0 0 0 0 0  Number falls in past yr: 0 0     Injury with Fall? 0 0     Risk for fall due to : No Fall Risks No Fall Risks No Fall Risks    Follow up Falls prevention discussed Falls evaluation completed Falls evaluation completed      FALL RISK PREVENTION PERTAINING TO THE HOME:  Any stairs in or around the home? Yes  If so, are there any without handrails? No  Home free of loose throw rugs in walkways, pet beds, electrical cords, etc? Yes  Adequate lighting in your home to reduce risk of falls? Yes   ASSISTIVE DEVICES UTILIZED TO PREVENT FALLS:  Life alert? No  Use of a cane, walker or w/c? No  Grab bars in the bathroom? No  Shower chair or bench in shower? Yes  Elevated toilet seat or a handicapped toilet? Yes   TIMED UP AND GO:  Was the test performed? No . Phone Visit  Cognitive Function:        08/31/2022    2:41 PM  6CIT Screen  What Year? 0 points  What month? 0 points  What time? 0 points  Count back from 20 0 points  Months in reverse 0 points  Repeat phrase 0 points  Total Score 0 points    Immunizations Immunization History  Administered Date(s) Administered   COVID-19, mRNA,  vaccine(Comirnaty)12 years and older 05/25/2022   Fluad Quad(high Dose 65+) 06/04/2019, 05/24/2020, 06/09/2021   Influenza Whole 05/26/2008, 05/21/2012, 05/11/2013   Influenza, High Dose Seasonal PF 05/05/2018   Influenza-Unspecified 05/29/2014, 05/28/2022   PFIZER(Purple Top)SARS-COV-2 Vaccination 01/14/2020, 02/04/2020, 08/16/2020   Pneumococcal Conjugate-13 10/15/2013   Pneumococcal Polysaccharide-23 10/15/2011   Tetanus 10/14/2012    TDAP status: Up to date  Flu Vaccine status: Up to date  Pneumococcal vaccine status: Up to date  Covid-19 vaccine status: Completed vaccines  Qualifies for Shingles Vaccine? Yes   Zostavax completed No   Shingrix Completed?: No.    Education has been provided regarding the importance of this vaccine. Patient has been advised to call insurance company to determine out of pocket expense if they have not yet received this vaccine. Advised may also receive vaccine at local pharmacy or Health Dept. Verbalized acceptance and understanding.  Screening Tests Health Maintenance  Topic Date Due   DTaP/Tdap/Td (1 - Tdap) 10/15/2012   COVID-19 Vaccine (5 - 2023-24 season) 07/20/2022  Zoster Vaccines- Shingrix (1 of 2) 10/10/2022 (Originally 07/05/1965)   Medicare Annual Wellness (AWV)  09/01/2023   Pneumonia Vaccine 57+ Years old  Completed   INFLUENZA VACCINE  Completed   Hepatitis C Screening  Completed   HPV VACCINES  Aged Out   COLONOSCOPY (Pts 45-9yrs Insurance coverage will need to be confirmed)  Discontinued    Health Maintenance  Health Maintenance Due  Topic Date Due   DTaP/Tdap/Td (1 - Tdap) 10/15/2012   COVID-19 Vaccine (5 - 2023-24 season) 07/20/2022    Colorectal cancer screening: Type of screening: Cologuard. Completed 06/22/2021. Repeat every 3 years  Lung Cancer Screening: (Low Dose CT Chest recommended if Age 39-80 years, 30 pack-year currently smoking OR have quit w/in 15years.) does not qualify.   Lung Cancer Screening  Referral: no  Additional Screening:  Hepatitis C Screening: does qualify; Completed: yes  Vision Screening: Recommended annual ophthalmology exams for early detection of glaucoma and other disorders of the eye. Is the patient up to date with their annual eye exam?  Yes  Who is the provider or what is the name of the office in which the patient attends annual eye exams? [facility] If pt is not established with a provider, would they like to be referred to a provider to establish care? No .   Dental Screening: Recommended annual dental exams for proper oral hygiene  Community Resource Referral / Chronic Care Management: CRR required this visit?  No   CCM required this visit?  No      Plan:     I have personally reviewed and noted the following in the patient's chart:   Medical and social history Use of alcohol, tobacco or illicit drugs  Current medications and supplements including opioid prescriptions. Patient is not currently taking opioid prescriptions. Functional ability and status Nutritional status Physical activity Advanced directives List of other physicians Hospitalizations, surgeries, and ER visits in previous 12 months Vitals Screenings to include cognitive, depression, and falls Referrals and appointments  In addition, I have reviewed and discussed with patient certain preventive protocols, quality metrics, and best practice recommendations. A written personalized care plan for preventive services as well as general preventive health recommendations were provided to patient.     Sheral Flow, LPN   [id number]   Nurse Notes: N/A

## 2022-08-31 NOTE — Assessment & Plan Note (Signed)
Lab Results  Component Value Date   CREATININE 2.32 (H) 07/10/2022   Stable overall, cont to avoid nephrotoxins

## 2022-08-31 NOTE — Assessment & Plan Note (Signed)
Lab Results  Component Value Date   HGBA1C 6.0 07/10/2022   Stable, pt to continue current medical treatment  - diet, wt control

## 2022-08-31 NOTE — Patient Instructions (Signed)
Troy Palmer , Thank you for taking time to come for your Medicare Wellness Visit. I appreciate your ongoing commitment to your health goals. Please review the following plan we discussed and let me know if I can assist you in the future.   These are the goals we discussed:  Goals      Client understands the importance of follow-up with providers by attending scheduled visits.        This is a list of the screening recommended for you and due dates:  Health Maintenance  Topic Date Due   DTaP/Tdap/Td vaccine (1 - Tdap) 10/15/2012   COVID-19 Vaccine (5 - 2023-24 season) 07/20/2022   Zoster (Shingles) Vaccine (1 of 2) 10/10/2022*   Medicare Annual Wellness Visit  09/01/2023   Pneumonia Vaccine  Completed   Flu Shot  Completed   Hepatitis C Screening: USPSTF Recommendation to screen - Ages 18-79 yo.  Completed   HPV Vaccine  Aged Out   Colon Cancer Screening  Discontinued  *Topic was postponed. The date shown is not the original due date.    Advanced directives: No  Conditions/risks identified: Yes  Next appointment: Follow up in one year for your annual wellness visit.   Preventive Care 63 Years and Older, Male  Preventive care refers to lifestyle choices and visits with your health care provider that can promote health and wellness. What does preventive care include? A yearly physical exam. This is also called an annual well check. Dental exams once or twice a year. Routine eye exams. Ask your health care provider how often you should have your eyes checked. Personal lifestyle choices, including: Daily care of your teeth and gums. Regular physical activity. Eating a healthy diet. Avoiding tobacco and drug use. Limiting alcohol use. Practicing safe sex. Taking low doses of aspirin every day. Taking vitamin and mineral supplements as recommended by your health care provider. What happens during an annual well check? The services and screenings done by your health care  provider during your annual well check will depend on your age, overall health, lifestyle risk factors, and family history of disease. Counseling  Your health care provider may ask you questions about your: Alcohol use. Tobacco use. Drug use. Emotional well-being. Home and relationship well-being. Sexual activity. Eating habits. History of falls. Memory and ability to understand (cognition). Work and work Statistician. Screening  You may have the following tests or measurements: Height, weight, and BMI. Blood pressure. Lipid and cholesterol levels. These may be checked every 5 years, or more frequently if you are over 79 years old. Skin check. Lung cancer screening. You may have this screening every year starting at age 57 if you have a 30-pack-year history of smoking and currently smoke or have quit within the past 15 years. Fecal occult blood test (FOBT) of the stool. You may have this test every year starting at age 27. Flexible sigmoidoscopy or colonoscopy. You may have a sigmoidoscopy every 5 years or a colonoscopy every 10 years starting at age 64. Prostate cancer screening. Recommendations will vary depending on your family history and other risks. Hepatitis C blood test. Hepatitis B blood test. Sexually transmitted disease (STD) testing. Diabetes screening. This is done by checking your blood sugar (glucose) after you have not eaten for a while (fasting). You may have this done every 1-3 years. Abdominal aortic aneurysm (AAA) screening. You may need this if you are a current or former smoker. Osteoporosis. You may be screened starting at age 41 if you  are at high risk. Talk with your health care provider about your test results, treatment options, and if necessary, the need for more tests. Vaccines  Your health care provider may recommend certain vaccines, such as: Influenza vaccine. This is recommended every year. Tetanus, diphtheria, and acellular pertussis (Tdap, Td)  vaccine. You may need a Td booster every 10 years. Zoster vaccine. You may need this after age 56. Pneumococcal 13-valent conjugate (PCV13) vaccine. One dose is recommended after age 72. Pneumococcal polysaccharide (PPSV23) vaccine. One dose is recommended after age 50. Talk to your health care provider about which screenings and vaccines you need and how often you need them. This information is not intended to replace advice given to you by your health care provider. Make sure you discuss any questions you have with your health care provider. Document Released: 08/26/2015 Document Revised: 04/18/2016 Document Reviewed: 05/31/2015 Elsevier Interactive Patient Education  2017 [employer] Prevention in the Home Falls can cause injuries. They can happen to people of all ages. There are many things you can do to make your home safe and to help prevent falls. What can I do on the outside of my home? Regularly fix the edges of walkways and driveways and fix any cracks. Remove anything that might make you trip as you walk through a door, such as a raised step or threshold. Trim any bushes or trees on the path to your home. Use bright outdoor lighting. Clear any walking paths of anything that might make someone trip, such as rocks or tools. Regularly check to see if handrails are loose or broken. Make sure that both sides of any steps have handrails. Any raised decks and porches should have guardrails on the edges. Have any leaves, snow, or ice cleared regularly. Use sand or salt on walking paths during winter. Clean up any spills in your garage right away. This includes oil or grease spills. What can I do in the bathroom? Use night lights. Install grab bars by the toilet and in the tub and shower. Do not use towel bars as grab bars. Use non-skid mats or decals in the tub or shower. If you need to sit down in the shower, use a plastic, non-slip stool. Keep the floor dry. Clean up any  water that spills on the floor as soon as it happens. Remove soap buildup in the tub or shower regularly. Attach bath mats securely with double-sided non-slip rug tape. Do not have throw rugs and other things on the floor that can make you trip. What can I do in the bedroom? Use night lights. Make sure that you have a light by your bed that is easy to reach. Do not use any sheets or blankets that are too big for your bed. They should not hang down onto the floor. Have a firm chair that has side arms. You can use this for support while you get dressed. Do not have throw rugs and other things on the floor that can make you trip. What can I do in the kitchen? Clean up any spills right away. Avoid walking on wet floors. Keep items that you use a lot in easy-to-reach places. If you need to reach something above you, use a strong step stool that has a grab bar. Keep electrical cords out of the way. Do not use floor polish or wax that makes floors slippery. If you must use wax, use non-skid floor wax. Do not have throw rugs and other things on the  floor that can make you trip. What can I do with my stairs? Do not leave any items on the stairs. Make sure that there are handrails on both sides of the stairs and use them. Fix handrails that are broken or loose. Make sure that handrails are as long as the stairways. Check any carpeting to make sure that it is firmly attached to the stairs. Fix any carpet that is loose or worn. Avoid having throw rugs at the top or bottom of the stairs. If you do have throw rugs, attach them to the floor with carpet tape. Make sure that you have a light switch at the top of the stairs and the bottom of the stairs. If you do not have them, ask someone to add them for you. What else can I do to help prevent falls? Wear shoes that: Do not have high heels. Have rubber bottoms. Are comfortable and fit you well. Are closed at the toe. Do not wear sandals. If you use a  stepladder: Make sure that it is fully opened. Do not climb a closed stepladder. Make sure that both sides of the stepladder are locked into place. Ask someone to hold it for you, if possible. Clearly mark and make sure that you can see: Any grab bars or handrails. First and last steps. Where the edge of each step is. Use tools that help you move around (mobility aids) if they are needed. These include: Canes. Walkers. Scooters. Crutches. Turn on the lights when you go into a dark area. Replace any light bulbs as soon as they burn out. Set up your furniture so you have a clear path. Avoid moving your furniture around. If any of your floors are uneven, fix them. If there are any pets around you, be aware of where they are. Review your medicines with your doctor. Some medicines can make you feel dizzy. This can increase your chance of falling. Ask your doctor what other things that you can do to help prevent falls. This information is not intended to replace advice given to you by your health care provider. Make sure you discuss any questions you have with your health care provider. Document Released: 05/26/2009 Document Revised: 01/05/2016 Document Reviewed: 09/03/2014 Elsevier Interactive Patient Education  2017 [employer].

## 2022-08-31 NOTE — Assessment & Plan Note (Signed)
Exam suggestive of superficial fungal rash, but not improved with anti-fungal tx.  Will need Derm referral,

## 2022-09-03 ENCOUNTER — Telehealth: Payer: Self-pay | Admitting: Internal Medicine

## 2022-09-03 NOTE — Telephone Encounter (Signed)
Pt states he saw Dr. Jenny Reichmann last week, possible cancer, skin is irritated and flaky, seems to be getting worse. Pt is concerned and nervous.   Gave him the number for the dermatologist office, but he would like a call back from Dr. Jenny Reichmann or his nurse to advise him on what he can do to control the rash until he sees the dermatologist.   Please call: [phone]

## 2022-09-04 NOTE — Telephone Encounter (Signed)
Patient would like advice on what he can do to control the rash until he sees the dermatologist, please advise

## 2022-09-04 NOTE — Telephone Encounter (Signed)
Ok to try OTC A&D ointment twice per day

## 2022-09-06 NOTE — Telephone Encounter (Signed)
Informed patient of OTC ointment he may use until appt with dermatologist

## 2022-09-13 DIAGNOSIS — L820 Inflamed seborrheic keratosis: Secondary | ICD-10-CM | POA: Diagnosis not present

## 2022-09-13 DIAGNOSIS — L821 Other seborrheic keratosis: Secondary | ICD-10-CM | POA: Diagnosis not present

## 2022-09-13 DIAGNOSIS — D225 Melanocytic nevi of trunk: Secondary | ICD-10-CM | POA: Diagnosis not present

## 2022-09-13 DIAGNOSIS — D485 Neoplasm of uncertain behavior of skin: Secondary | ICD-10-CM | POA: Diagnosis not present

## 2022-09-13 DIAGNOSIS — L308 Other specified dermatitis: Secondary | ICD-10-CM | POA: Diagnosis not present

## 2022-09-27 ENCOUNTER — Encounter (HOSPITAL_COMMUNITY): Payer: Self-pay

## 2022-09-27 ENCOUNTER — Ambulatory Visit (HOSPITAL_COMMUNITY)
Admission: EM | Admit: 2022-09-27 | Discharge: 2022-09-27 | Disposition: A | Discharge status: Home / Self Care | Payer: Medicare PPO | Attending: Emergency Medicine | Admitting: Emergency Medicine

## 2022-09-27 DIAGNOSIS — R051 Acute cough: Secondary | ICD-10-CM | POA: Diagnosis not present

## 2022-09-27 DIAGNOSIS — J3089 Other allergic rhinitis: Secondary | ICD-10-CM

## 2022-09-27 MED ORDER — IPRATROPIUM BROMIDE 0.06 % NA SOLN: 2.0000 | Freq: Three times a day (TID) | NASAL | 1 refills | Status: DC

## 2022-09-27 MED ORDER — CETIRIZINE HCL 1 MG/ML PO SOLN: 5.0000 mg | Freq: Every day | ORAL | 1 refills | Status: DC

## 2022-09-27 MED ORDER — PROMETHAZINE-DM 6.25-15 MG/5ML PO SYRP: 2.5000 mL | ORAL_SOLUTION | Freq: Four times a day (QID) | ORAL | 0 refills | Status: DC | PRN

## 2022-09-27 NOTE — Discharge Instructions (Signed)
Please read below to learn more about the medications, dosages and frequencies that I recommend to help alleviate your symptoms and to get you feeling better soon:   Zyrtec (cetirizine): This is an excellent second-generation antihistamine that helps to reduce respiratory inflammatory response to environmental allergens.  In some patients, this medication can cause daytime sleepiness so I recommend that you take 5 mL daily at bedtime.  I have provided you with a coupon for this medication.    Atrovent (ipratropium): This is an excellent nasal decongestant spray that does not cause rebound congestion.  Please instill 2 sprays into each nare with each use, you may use this up to 3 times daily.  Once you find that you are forgetting to use the spray more often that you remember to use it, you will know that you no longer need it.    Promethazine DM: Promethazine is both a nasal decongestant and an antinausea medication that makes most patients feel fairly sleepy.  The DM is dextromethorphan, a cough suppressant found in many over-the-counter cough medications.  Please take 2.5 mL up to 4 times daily to minimize your cough.   I have provided you with a coupon for this medication.   Please follow-up within the next 5-7 days either with your primary care provider or urgent care if your symptoms do not resolve.  If you do not have a primary care provider, we will assist you in finding one.        Thank you for visiting urgent care today.  We appreciate the opportunity to participate in your care.

## 2022-09-27 NOTE — ED Triage Notes (Signed)
Pt is here for cough , fever, low energy, diarrhea stomach pain, runny, nose, back pain, neck pain, loss of appetite x 1wk

## 2022-09-27 NOTE — ED Provider Notes (Signed)
[place]    CSN: [id number] Arrival date & time: 09/27/22  0800    HISTORY   Chief Complaint  Patient presents with   Cough   HPI Troy Palmer is a pleasant, 77 y.o. male who presents to urgent care today. Pt is here for cough, fever, low energy, diarrhea stomach pain, runny, nose, back pain, neck pain, loss of appetite x 1wk. patient reports a history of allergies, states he has tried many allergy medications and none of them seem to help.  Patient states he is also had immunosuppressive therapy for a year which also did not help.  Patient states not currently taking any allergy medications.  Patient states his most troublesome symptom at this time is cough which is worse at night and keeping him awake.  Patient states the cough is occasionally productive of phlegm.  Patient denies fever, diarrhea, nausea, vomiting, back pain and neck pain at this time, states they have resolved.  The history is provided by the patient.   Past Medical History:  Diagnosis Date   ALLERGIC RHINITIS 04/09/2007   Allergy    ASTHMA 12/04/2007   BACK PAIN 09/16/2009   BENIGN PROSTATIC HYPERTROPHY 04/09/2007   CEREBROVASCULAR ACCIDENT, HX OF 07/17/2010   COLONIC POLYPS, HX OF 12/04/2007   CONSTIPATION 09/25/2010   Degeneration of cervical intervertebral disc 03/28/2007   DEPRESSION 12/04/2007   DIVERTICULOSIS, COLON 12/04/2007   DYSPHAGIA UNSPECIFIED 03/16/2008   ERECTILE DYSFUNCTION 04/09/2007   ESOPHAGEAL STRICTURE 05/26/2008   GERD 12/04/2007   HEMORRHOIDS, RECURRENT 02/11/2008   HYPERLIPIDEMIA 12/04/2007   HYPERTENSION 03/28/2007   LEG PAIN, BILATERAL 01/25/2010   LOW BACK PAIN 04/09/2007   LUNG NODULE 12/04/2007   Osteoarth NOS-Unspec 03/28/2007   Other dysphagia 11/21/2009   Peripheral neuropathy 05/29/2018   POLYARTHRALGIA 07/13/2008   Polymyalgia rheumatica ([place]) 07/26/2008   PVD WITH CLAUDICATION 02/10/2010   RENAL INSUFFICIENCY 03/15/2010   Sleep apnea    no cpap    SLEEP APNEA,  OBSTRUCTIVE, MODERATE 04/09/2007   Stroke ([place])    TIA    TB SKIN TEST, POSITIVE 03/28/2007   Patient Active Problem List   Diagnosis Date Noted   Rash of both hands 08/30/2022   Rash 07/10/2022   Acute sinus infection 12/24/2021   Nasal polyp 12/24/2021   Anemia 10/04/2021   Chronic pain of left thumb 06/11/2021   Screen for colon cancer 06/11/2021   Allergic rhinitis 10/31/2020   Elevated PTHrP level 10/31/2020   Aortic atherosclerosis ([place]) 05/11/2020   Dysuria 03/09/2020   Constipation 03/09/2020   Vitamin D deficiency 12/05/2019   B12 deficiency 12/05/2019   Contact dermatitis 09/08/2019   Left knee pain 06/04/2019   Neck pain 02/23/2019   Recurrent falls 08/19/2018   Abdominal pain 07/25/2018   Peripheral neuropathy 05/29/2018   Paresthesia of bilateral legs 05/05/2018   Cough variant asthma  vs UACS assoc with pnds 02/27/2018   Asthmatic bronchitis 02/10/2018   Acute respiratory failure with hypoxia ([place]) 02/09/2018   Pain, dental 02/03/2018   Cough 02/03/2018   Wheezing 02/03/2018   Pain and swelling of lower leg, right 12/31/2017   BPH with obstruction/lower urinary tract symptoms 05/16/2017   Bilateral ankle pain 05/16/2017   DOE (dyspnea on exertion) 11/16/2016   Complete tear of right rotator cuff 06/26/2016   Cellulitis of deltoid region 06/26/2016   Gout 08/18/2015   Congestive heart failure (CHF) ([place]) 08/18/2015   Spinal stenosis of lumbar region at multiple levels 08/18/2015   Greater trochanteric  bursitis of right hip 05/05/2014   Right hip pain 05/04/2014   Posterior neck pain 12/22/2013   Orchitis, right 12/22/2013   Paresthesia of both feet 12/22/2013   Acute pain of left shoulder 09/16/2013   Allergic angioedema 07/31/2012   Impaired glucose tolerance 04/15/2012   Chest pain 02/24/2012   Foot pain, bilateral 02/20/2012   Leg pain, bilateral 01/19/2012   Edema 01/16/2012   Dysphagia 11/21/2011   Gout 06/07/2011   CKD (chronic kidney disease)  stage 4, GFR 15-29 ml/min ([place]) 06/07/2011   Encounter for well adult exam with abnormal findings 02/14/2011   Encounter for long-term (current) use of other medications 11/08/2010   CONSTIPATION 09/25/2010   Memory loss 06/28/2010   PVD (peripheral vascular disease) ([place]) 02/10/2010   LEG PAIN, BILATERAL 01/25/2010   Epigastric pain 11/21/2009   Backache 09/16/2009   Polymyalgia rheumatica ([place]) 07/26/2008   POLYARTHRALGIA 07/13/2008   ESOPHAGEAL STRICTURE 05/26/2008   HEMORRHOIDS, RECURRENT 02/11/2008   Hypercholesterolemia 12/04/2007   DEPRESSION 12/04/2007   Asthma 12/04/2007   LUNG NODULE 12/04/2007   Gastroesophageal reflux disease without esophagitis 12/04/2007   DIVERTICULOSIS, COLON 12/04/2007   COLONIC POLYPS, HX OF 12/04/2007   ERECTILE DYSFUNCTION 04/09/2007   SLEEP APNEA, OBSTRUCTIVE, MODERATE 04/09/2007   Atopic rhinitis 04/09/2007   BENIGN PROSTATIC HYPERTROPHY 04/09/2007   LOW BACK PAIN 04/09/2007   Morbid obesity due to excess calories ([place]) 03/28/2007   Essential hypertension 03/28/2007   Osteoarthritis of multiple joints 03/28/2007   Degeneration of cervical intervertebral disc 03/28/2007   TB SKIN TEST, POSITIVE 03/28/2007   Past Surgical History:  Procedure Laterality Date   COLONOSCOPY     ROTATOR CUFF REPAIR Bilateral    TOTAL KNEE ARTHROPLASTY     x 2    Home Medications    Prior to Admission medications   Medication Sig Start Date End Date Taking? Authorizing Provider  acetaminophen (TYLENOL) 650 MG CR tablet Take 1 tablet (650 mg total) by mouth every 6 (six) hours as needed (back pain). 03/02/21  Yes Domenic Moras, PA-C  amLODipine (NORVASC) 5 MG tablet TAKE 1 TABLET(5 MG) BY MOUTH DAILY 04/10/22  Yes Biagio Borg, MD  atorvastatin (LIPITOR) 80 MG tablet Take 1 tablet (80 mg total) by mouth daily. 11/23/21  Yes Biagio Borg, MD  Cholecalciferol 50 MCG (2000 UT) TABS 1 tab by mouth once daily 10/04/21  Yes Biagio Borg, MD  furosemide (LASIX) 40  MG tablet TAKE 1 AND 1/2 TABLETS(60 MG) BY MOUTH TWICE DAILY 08/28/22  Yes Biagio Borg, MD  metoprolol succinate (TOPROL-XL) 50 MG 24 hr tablet Take 1 tablet (50 mg total) by mouth daily. Take with or immediately following a meal. 12/11/21  Yes Biagio Borg, MD  tamsulosin (FLOMAX) 0.4 MG CAPS capsule TAKE 1 CAPSULE(0.4 MG) BY MOUTH TWICE DAILY 02/21/22  Yes Biagio Borg, MD  tiZANidine (ZANAFLEX) 2 MG tablet Take 1 tablet (2 mg total) by mouth every 8 (eight) hours as needed for muscle spasms. 03/02/21  Yes Domenic Moras, PA-C  traMADol (ULTRAM) 50 MG tablet Take 1 tablet (50 mg total) by mouth every 6 (six) hours as needed. 10/26/20  Yes Biagio Borg, MD  triamcinolone (NASACORT) 55 MCG/ACT AERO nasal inhaler Place 2 sprays into the nose daily. 10/04/21  Yes Biagio Borg, MD  vitamin B-12 (CYANOCOBALAMIN) 1000 MCG tablet Take 1 tablet (1,000 mcg total) by mouth daily. 12/03/19  Yes Biagio Borg, MD  allopurinol (ZYLOPRIM) 100 MG tablet TAKE  1 TABLET BY MOUTH ONCE DAILY 05/27/20   Biagio Borg, MD  BYSTOLIC 20 MG TABS Take 1 tablet by mouth daily. 11/16/20   [provider]  cetirizine (ZYRTEC) 10 MG tablet Take 1 tablet (10 mg total) by mouth daily. 10/26/20 10/26/21  Biagio Borg, MD  clotrimazole-betamethasone (LOTRISONE) cream Apply 1 Application topically daily. 07/10/22   Biagio Borg, MD  colchicine 0.6 MG tablet Take 1 tablet (0.6 mg total) by mouth daily. For gout pain 06/22/19   Gregor Hams, MD  fluconazole (DIFLUCAN) 100 MG tablet Take 1 tablet (100 mg total) by mouth daily. 07/10/22   Biagio Borg, MD  hydrALAZINE (APRESOLINE) 25 MG tablet Take 1 tablet (25 mg total) by mouth in the morning and at bedtime. 12/03/19   Biagio Borg, MD  levocetirizine (XYZAL) 5 MG tablet Take 1 tablet (5 mg total) by mouth every evening. 01/07/20 01/06/21  Biagio Borg, MD  lidocaine (LIDODERM) 5 % Place 1 patch onto the skin daily. Apply patch to the left shoulder area. Remove & Discard patch within  12 hours or as directed by MD 02/17/19   Petrucelli, Glynda Jaeger, PA-C  pantoprazole (PROTONIX) 40 MG tablet Take 1 tablet (40 mg total) by mouth daily. 01/12/22   Biagio Borg, MD  PFIZER-BIONTECH COVID-19 [facility] 30 MCG/0.3ML injection  08/16/20   [provider]    Family History Family History  Problem Relation Age of Onset   Healthy Mother    Heart disease Father    Lupus Brother    Hypertension Brother    Asthma Other    Colon cancer Neg Hx    Colon polyps Neg Hx    Esophageal cancer Neg Hx    Rectal cancer Neg Hx    Stomach cancer Neg Hx    Social History Social History   Tobacco Use   Smoking status: Former   Smokeless tobacco: Never  Scientific laboratory technician Use: Never used  Substance Use Topics   Alcohol use: No   Drug use: No   Allergies   Ace inhibitors, Augmentin [amoxicillin-pot clavulanate], Doxycycline, and Sulfa antibiotics  Review of Systems Review of Systems Pertinent findings revealed after performing a 14 point review of systems has been noted in the history of present illness.  Physical Exam Vital Signs BP 130/78 (BP Location: Right Arm)   Pulse 75   Temp 98 F (36.7 C) (Oral)   Resp 16   SpO2 96%   No data found.  Physical Exam Vitals and nursing note reviewed.  Constitutional:      General: He is not in acute distress.    Appearance: Normal appearance. He is not ill-appearing.  HENT:     Head: Normocephalic and atraumatic.     Salivary Glands: Right salivary gland is not diffusely enlarged or tender. Left salivary gland is not diffusely enlarged or tender.     Right Ear: Ear canal and external ear normal. No drainage. A middle ear effusion is present. There is no impacted cerumen. Tympanic membrane is bulging. Tympanic membrane is not injected or erythematous.     Left Ear: Ear canal and external ear normal. No drainage. A middle ear effusion is present. There is no impacted cerumen. Tympanic membrane is bulging. Tympanic membrane is not  injected or erythematous.     Ears:     Comments: Bilateral EACs normal, both TMs bulging with clear fluid    Nose: Rhinorrhea present. No nasal deformity,  septal deviation, signs of injury, nasal tenderness, mucosal edema or congestion. Rhinorrhea is clear.     Right Nostril: Occlusion present. No foreign body, epistaxis or septal hematoma.     Left Nostril: Occlusion present. No foreign body, epistaxis or septal hematoma.     Right Turbinates: Enlarged, swollen and pale.     Left Turbinates: Enlarged, swollen and pale.     Right Sinus: No maxillary sinus tenderness or frontal sinus tenderness.     Left Sinus: No maxillary sinus tenderness or frontal sinus tenderness.     Mouth/Throat:     Lips: Pink. No lesions.     Mouth: Mucous membranes are moist. No oral lesions.     Pharynx: Oropharynx is clear. Uvula midline. No posterior oropharyngeal erythema or uvula swelling.     Tonsils: No tonsillar exudate. 0 on the right. 0 on the left.     Comments: Postnasal drip Eyes:     General: Lids are normal.        Right eye: No discharge.        Left eye: No discharge.     Extraocular Movements: Extraocular movements intact.     Conjunctiva/sclera: Conjunctivae normal.     Right eye: Right conjunctiva is not injected.     Left eye: Left conjunctiva is not injected.  Neck:     Trachea: Trachea and phonation normal.  Cardiovascular:     Rate and Rhythm: Normal rate and regular rhythm.     Pulses: Normal pulses.     Heart sounds: Normal heart sounds. No murmur heard.    No friction rub. No gallop.  Pulmonary:     Effort: Pulmonary effort is normal. No accessory muscle usage, prolonged expiration or respiratory distress.     Breath sounds: Normal breath sounds. No stridor, decreased air movement or transmitted upper airway sounds. No decreased breath sounds, wheezing, rhonchi or rales.  Chest:     Chest wall: No tenderness.  Musculoskeletal:        General: Normal range of motion.      Cervical back: Normal range of motion and neck supple. Normal range of motion.  Lymphadenopathy:     Cervical: No cervical adenopathy.  Skin:    General: Skin is warm and dry.     Findings: No erythema or rash.  Neurological:     General: No focal deficit present.     Mental Status: He is alert and oriented to person, place, and time.  Psychiatric:        Mood and Affect: Mood normal.        Behavior: Behavior normal.    Visual Acuity Right Eye Distance:   Left Eye Distance:   Bilateral Distance:    Right Eye Near:   Left Eye Near:    Bilateral Near:     UC Couse / Diagnostics / Procedures:     Radiology No results found.  Procedures Procedures (including critical care time) EKG  Pending results:  Labs Reviewed - No data to display  Medications Ordered in UC: Medications - No data to display  UC Diagnoses / Final Clinical Impressions(s)   I have reviewed the triage vital signs and the nursing notes.  Pertinent labs & imaging results that were available during my care of the patient were reviewed by me and considered in my medical decision making (see chart for details).    Final diagnoses:  Non-seasonal allergic rhinitis, unspecified trigger  Acute cough   Patient provided with renewal of  allergy medications, Zyrtec 5 mL nightly given age and ipratropium nasal spray given failure of nasal steroids, patient states they make his nose "too dry".  Patient provided with Promethazine DM for mL for nighttime cough.  Return precautions advised.  Please see discharge instructions below for further details of plan of care as provided to patient. ED Prescriptions     Medication Sig Dispense Auth. Provider   cetirizine HCl (ZYRTEC) 1 MG/ML solution Take 5 mLs (5 mg total) by mouth at bedtime. 150 mL Lynden Oxford Scales, PA-C   promethazine-dextromethorphan (PROMETHAZINE-DM) 6.25-15 MG/5ML syrup Take 2.5 mLs by mouth 4 (four) times daily as needed for cough. 60 mL Lynden Oxford Scales, PA-C   ipratropium (ATROVENT) 0.06 % nasal spray Place 2 sprays into both nostrils 3 (three) times daily. As needed for nasal congestion, runny nose 15 mL Lynden Oxford Scales, PA-C      PDMP not reviewed this encounter.  Disposition Upon Discharge:  Condition: stable for discharge home Home: take medications as prescribed; routine discharge instructions as discussed; follow up as advised.  Patient presented with an acute illness with associated systemic symptoms and significant discomfort requiring urgent management. In my opinion, this is a condition that a prudent lay person (someone who possesses an average knowledge of health and medicine) may potentially expect to result in complications if not addressed urgently such as respiratory distress, impairment of bodily function or dysfunction of bodily organs.   Routine symptom specific, illness specific and/or disease specific instructions were discussed with the patient and/or caregiver at length.   As such, the patient has been evaluated and assessed, work-up was performed and treatment was provided in alignment with urgent care protocols and evidence based medicine.  Patient/parent/caregiver has been advised that the patient may require follow up for further testing and treatment if the symptoms continue in spite of treatment, as clinically indicated and appropriate.  If the patient was tested for COVID-19, Influenza and/or RSV, then the patient/parent/guardian was advised to isolate at home pending the results of his/her diagnostic coronavirus test and potentially longer if they're positive. I have also advised pt that if his/her COVID-19 test returns positive, it's recommended to self-isolate for at least 10 days after symptoms first appeared AND until fever-free for 24 hours without fever reducer AND other symptoms have improved or resolved. Discussed self-isolation recommendations as well as instructions for household  member/close contacts as per the [facility] and [place] [facility], and also gave patient the [place] packet with this information.  Patient/parent/caregiver has been advised to return to the [facility] or PCP in 3-5 days if no better; to PCP or the Emergency Department if new signs and symptoms develop, or if the current signs or symptoms continue to change or worsen for further workup, evaluation and treatment as clinically indicated and appropriate  The patient will follow up with their current PCP if and as advised. If the patient does not currently have a PCP we will assist them in obtaining one.   The patient may need specialty follow up if the symptoms continue, in spite of conservative treatment and management, for further workup, evaluation, consultation and treatment as clinically indicated and appropriate.  Patient/parent/caregiver verbalized understanding and agreement of plan as discussed.  All questions were addressed during visit.  Please see discharge instructions below for further details of plan.  Discharge Instructions:   Discharge Instructions      Please read below to learn more about the medications, dosages and frequencies that I recommend to  help alleviate your symptoms and to get you feeling better soon:   Zyrtec (cetirizine): This is an excellent second-generation antihistamine that helps to reduce respiratory inflammatory response to environmental allergens.  In some patients, this medication can cause daytime sleepiness so I recommend that you take 5 mL daily at bedtime.  I have provided you with a coupon for this medication.    Atrovent (ipratropium): This is an excellent nasal decongestant spray that does not cause rebound congestion.  Please instill 2 sprays into each nare with each use, you may use this up to 3 times daily.  Once you find that you are forgetting to use the spray more often that you remember to use it, you will know that you no longer need it.    Promethazine DM:  Promethazine is both a nasal decongestant and an antinausea medication that makes most patients feel fairly sleepy.  The DM is dextromethorphan, a cough suppressant found in many over-the-counter cough medications.  Please take 2.5 mL up to 4 times daily to minimize your cough.   I have provided you with a coupon for this medication.   Please follow-up within the next 5-7 days either with your primary care provider or urgent care if your symptoms do not resolve.  If you do not have a primary care provider, we will assist you in finding one.        Thank you for visiting urgent care today.  We appreciate the opportunity to participate in your care.       This office note has been dictated using Museum/gallery curator.  Unfortunately, this method of dictation can sometimes lead to typographical or grammatical errors.  I apologize for your inconvenience in advance if this occurs.  Please do not hesitate to reach out to me if clarification is needed.      Lynden Oxford Scales, [place] 09/27/22 [phone]

## 2022-10-05 DIAGNOSIS — L308 Other specified dermatitis: Secondary | ICD-10-CM | POA: Diagnosis not present

## 2022-10-05 DIAGNOSIS — L930 Discoid lupus erythematosus: Secondary | ICD-10-CM | POA: Diagnosis not present

## 2022-10-14 ENCOUNTER — Other Ambulatory Visit: Payer: Self-pay | Admitting: Internal Medicine

## 2022-10-14 NOTE — Telephone Encounter (Signed)
Please refill as per office routine med refill policy (all routine meds to be refilled for 3 mo or monthly (per pt preference) up to one year from last visit, then month to month grace period for 3 mo, then further med refills will have to be denied) ? ?

## 2022-10-29 ENCOUNTER — Ambulatory Visit (INDEPENDENT_AMBULATORY_CARE_PROVIDER_SITE_OTHER): Payer: Medicare PPO | Admitting: Internal Medicine

## 2022-10-29 ENCOUNTER — Encounter: Payer: Self-pay | Admitting: Internal Medicine

## 2022-10-29 VITALS — BP 118/70 | HR 65 | Temp 97.6°F | Ht 69.0 in | Wt 272.0 lb

## 2022-10-29 DIAGNOSIS — E7800 Pure hypercholesterolemia, unspecified: Secondary | ICD-10-CM | POA: Diagnosis not present

## 2022-10-29 DIAGNOSIS — D649 Anemia, unspecified: Secondary | ICD-10-CM | POA: Diagnosis not present

## 2022-10-29 DIAGNOSIS — Z20828 Contact with and (suspected) exposure to other viral communicable diseases: Secondary | ICD-10-CM

## 2022-10-29 DIAGNOSIS — Z0001 Encounter for general adult medical examination with abnormal findings: Secondary | ICD-10-CM

## 2022-10-29 DIAGNOSIS — Z202 Contact with and (suspected) exposure to infections with a predominantly sexual mode of transmission: Secondary | ICD-10-CM | POA: Diagnosis not present

## 2022-10-29 DIAGNOSIS — M353 Polymyalgia rheumatica: Secondary | ICD-10-CM | POA: Diagnosis not present

## 2022-10-29 DIAGNOSIS — E538 Deficiency of other specified B group vitamins: Secondary | ICD-10-CM

## 2022-10-29 DIAGNOSIS — R21 Rash and other nonspecific skin eruption: Secondary | ICD-10-CM

## 2022-10-29 DIAGNOSIS — E559 Vitamin D deficiency, unspecified: Secondary | ICD-10-CM | POA: Diagnosis not present

## 2022-10-29 DIAGNOSIS — Z125 Encounter for screening for malignant neoplasm of prostate: Secondary | ICD-10-CM | POA: Diagnosis not present

## 2022-10-29 DIAGNOSIS — R7302 Impaired glucose tolerance (oral): Secondary | ICD-10-CM

## 2022-10-29 LAB — MICROALBUMIN / CREATININE URINE RATIO
Creatinine,U: 157.3 mg/dL
Microalb Creat Ratio: 16.4 mg/g (ref 0.0–30.0)
Microalb, Ur: 25.7 mg/dL — ABNORMAL HIGH (ref 0.0–1.9)

## 2022-10-29 LAB — URINALYSIS, ROUTINE W REFLEX MICROSCOPIC
Bilirubin Urine: NEGATIVE
Hgb urine dipstick: NEGATIVE
Ketones, ur: NEGATIVE
Leukocytes,Ua: NEGATIVE
Nitrite: NEGATIVE
RBC / HPF: NONE SEEN (ref 0–?)
Specific Gravity, Urine: 1.015 (ref 1.000–1.030)
Total Protein, Urine: 30 — AB
Urine Glucose: NEGATIVE
Urobilinogen, UA: 0.2 (ref 0.0–1.0)
pH: 6 (ref 5.0–8.0)

## 2022-10-29 LAB — PSA: PSA: 1.09 ng/mL (ref 0.10–4.00)

## 2022-10-29 LAB — BASIC METABOLIC PANEL
BUN: 25 mg/dL — ABNORMAL HIGH (ref 6–23)
CO2: 29 mEq/L (ref 19–32)
Calcium: 8.7 mg/dL (ref 8.4–10.5)
Chloride: 104 mEq/L (ref 96–112)
Creatinine, Ser: 2.01 mg/dL — ABNORMAL HIGH (ref 0.40–1.50)
GFR: 31.68 mL/min — ABNORMAL LOW (ref >60.00)
Glucose, Bld: 130 mg/dL — ABNORMAL HIGH (ref 70–99)
Potassium: 3.5 mEq/L (ref 3.5–5.1)
Sodium: 143 mEq/L (ref 135–145)

## 2022-10-29 LAB — SEDIMENTATION RATE: Sed Rate: 59 mm/hr — ABNORMAL HIGH (ref 0–20)

## 2022-10-29 LAB — FERRITIN: Ferritin: 132.5 ng/mL (ref 22.0–322.0)

## 2022-10-29 LAB — CBC WITH DIFFERENTIAL/PLATELET
Basophils Absolute: 0 10*3/uL (ref 0.0–0.1)
Basophils Relative: 0.5 % (ref 0.0–3.0)
Eosinophils Absolute: 0.2 10*3/uL (ref 0.0–0.7)
Eosinophils Relative: 4.2 % (ref 0.0–5.0)
HCT: 38.1 % — ABNORMAL LOW (ref 39.0–52.0)
Hemoglobin: 12.1 g/dL — ABNORMAL LOW (ref 13.0–17.0)
Lymphocytes Relative: 28.1 % (ref 12.0–46.0)
Lymphs Abs: 1.3 10*3/uL (ref 0.7–4.0)
MCHC: 31.8 g/dL (ref 30.0–36.0)
MCV: 85.1 fl (ref 78.0–100.0)
Monocytes Absolute: 0.6 10*3/uL (ref 0.1–1.0)
Monocytes Relative: 13.3 % — ABNORMAL HIGH (ref 3.0–12.0)
Neutro Abs: 2.5 10*3/uL (ref 1.4–7.7)
Neutrophils Relative %: 53.9 % (ref 43.0–77.0)
Platelets: 214 10*3/uL (ref 150.0–400.0)
RBC: 4.48 Mil/uL (ref 4.22–5.81)
RDW: 15.3 % (ref 11.5–15.5)
WBC: 4.6 10*3/uL (ref 4.0–10.5)

## 2022-10-29 LAB — IBC PANEL
Iron: 47 ug/dL (ref 42–165)
Saturation Ratios: 21.8 % (ref 20.0–50.0)
TIBC: 215.6 ug/dL — ABNORMAL LOW (ref 250.0–450.0)
Transferrin: 154 mg/dL — ABNORMAL LOW (ref 212.0–360.0)

## 2022-10-29 LAB — VITAMIN B12: Vitamin B-12: 481 pg/mL (ref 211–911)

## 2022-10-29 LAB — TSH: TSH: 2.14 u[IU]/mL (ref 0.35–5.50)

## 2022-10-29 LAB — LIPID PANEL
Cholesterol: 145 mg/dL (ref 0–200)
HDL: 44.8 mg/dL (ref >39.00)
LDL Cholesterol: 86 mg/dL (ref 0–99)
NonHDL: 99.97
Total CHOL/HDL Ratio: 3
Triglycerides: 69 mg/dL (ref 0.0–149.0)
VLDL: 13.8 mg/dL (ref 0.0–40.0)

## 2022-10-29 LAB — HEPATIC FUNCTION PANEL
ALT: 9 U/L (ref 0–53)
AST: 15 U/L (ref 0–37)
Albumin: 3.2 g/dL — ABNORMAL LOW (ref 3.5–5.2)
Alkaline Phosphatase: 121 U/L — ABNORMAL HIGH (ref 39–117)
Bilirubin, Direct: 0.1 mg/dL (ref 0.0–0.3)
Total Bilirubin: 0.4 mg/dL (ref 0.2–1.2)
Total Protein: 6.1 g/dL (ref 6.0–8.3)

## 2022-10-29 LAB — VITAMIN D 25 HYDROXY (VIT D DEFICIENCY, FRACTURES): VITD: 30.68 ng/mL (ref 30.00–100.00)

## 2022-10-29 LAB — HEMOGLOBIN A1C: Hgb A1c MFr Bld: 6 % (ref 4.6–6.5)

## 2022-10-29 NOTE — Assessment & Plan Note (Signed)
Lab Results  Component Value Date   [id number] 10/29/2022   Stable, cont oral replacement - b12 1000 mcg qd

## 2022-10-29 NOTE — Assessment & Plan Note (Signed)
Pt to f/u with dermatology

## 2022-10-29 NOTE — Progress Notes (Signed)
Patient ID: Troy Palmer, male   DOB: 06/01/1946, 77 y.o.   MRN: [id number]         Chief Complaint:: wellness exam and bloodwork (Wants to be checked for everything (STD) , he is getting married soon )  , not sure if he has had chickenpox before, rash - ? Lupus related to hands,  low vit d. Low b12, PMR       HPI:  Troy Palmer is a 77 y.o. male here for wellness exam; due for tdap and shingrix at pharmacy, o/w up to date                        Also asks for STD check as he plans to get married soon and has been active with other partner unprotected.  Pt denies chest pain, increased sob or doe, wheezing, orthopnea, PND, increased LE swelling, palpitations, dizziness or syncope.   Pt denies polydipsia, polyuria, or new focal neuro s/s.    Pt denies fever, wt loss, night sweats, loss of appetite, or other constitutional symptoms  But does have persistent bilateral shoulder pain not currently taking prednisone.  Also has new lupus like skin lesions to posterior hands, and pt has been referred to dermatology per rheum for further consideration.     Wt Readings from Last 3 Encounters:  10/29/22 272 lb (123.4 kg)  08/31/22 275 lb (124.7 kg)  08/30/22 276 lb (125.2 kg)   BP Readings from Last 3 Encounters:  10/29/22 118/70  09/27/22 130/78  08/30/22 132/72   Immunization History  Administered Date(s) Administered   COVID-19, mRNA, vaccine(Comirnaty)12 years and older 05/25/2022   Fluad Quad(high Dose 65+) 06/04/2019, 05/24/2020, 06/09/2021   Influenza Whole 05/26/2008, 05/21/2012, 05/11/2013   Influenza, High Dose Seasonal PF 05/05/2018   Influenza-Unspecified 05/29/2014, 05/28/2022   PFIZER(Purple Top)SARS-COV-2 Vaccination 01/14/2020, 02/04/2020, 08/16/2020   Pneumococcal Conjugate-13 10/15/2013   Pneumococcal Polysaccharide-23 10/15/2011   Tetanus 10/14/2012   Health Maintenance Due  Topic Date Due   Zoster Vaccines- Shingrix (1 of 2) Never done   DTaP/Tdap/Td (1 - Tdap)  10/15/2012   COVID-19 Vaccine (5 - 2023-24 season) 07/20/2022      Past Medical History:  Diagnosis Date   ALLERGIC RHINITIS 04/09/2007   Allergy    ASTHMA 12/04/2007   BACK PAIN 09/16/2009   BENIGN PROSTATIC HYPERTROPHY 04/09/2007   CEREBROVASCULAR ACCIDENT, HX OF 07/17/2010   COLONIC POLYPS, HX OF 12/04/2007   CONSTIPATION 09/25/2010   Degeneration of cervical intervertebral disc 03/28/2007   DEPRESSION 12/04/2007   DIVERTICULOSIS, COLON 12/04/2007   DYSPHAGIA UNSPECIFIED 03/16/2008   ERECTILE DYSFUNCTION 04/09/2007   ESOPHAGEAL STRICTURE 05/26/2008   GERD 12/04/2007   HEMORRHOIDS, RECURRENT 02/11/2008   HYPERLIPIDEMIA 12/04/2007   HYPERTENSION 03/28/2007   LEG PAIN, BILATERAL 01/25/2010   LOW BACK PAIN 04/09/2007   LUNG NODULE 12/04/2007   Osteoarth NOS-Unspec 03/28/2007   Other dysphagia 11/21/2009   Peripheral neuropathy 05/29/2018   POLYARTHRALGIA 07/13/2008   Polymyalgia rheumatica ([place]) 07/26/2008   PVD WITH CLAUDICATION 02/10/2010   RENAL INSUFFICIENCY 03/15/2010   Sleep apnea    no cpap    SLEEP APNEA, OBSTRUCTIVE, MODERATE 04/09/2007   Stroke ([place])    TIA    TB SKIN TEST, POSITIVE 03/28/2007   Past Surgical History:  Procedure Laterality Date   COLONOSCOPY     ROTATOR CUFF REPAIR Bilateral    TOTAL KNEE ARTHROPLASTY     x 2    reports that he has  quit smoking. He has never used smokeless tobacco. He reports that he does not drink alcohol and does not use drugs. family history includes Asthma in an other family member; Healthy in his mother; Heart disease in his father; Hypertension in his brother; Lupus in his brother. Allergies  Allergen Reactions   Ace Inhibitors Swelling    Angioedema throat   Augmentin [Amoxicillin-Pot Clavulanate] Other (See Comments)    Marked weakness with po med x 3 doses   Doxycycline     Some GI upset   Sulfa Antibiotics Hives    And facial angioedema   Current Outpatient Medications on File Prior to Visit  Medication Sig Dispense Refill    acetaminophen (TYLENOL) 650 MG CR tablet Take 1 tablet (650 mg total) by mouth every 6 (six) hours as needed (back pain). 30 tablet 0   amLODipine (NORVASC) 5 MG tablet TAKE 1 TABLET(5 MG) BY MOUTH DAILY 90 tablet 1   atorvastatin (LIPITOR) 80 MG tablet Take 1 tablet (80 mg total) by mouth daily. 90 tablet 3   cetirizine HCl (ZYRTEC) 1 MG/ML solution Take 5 mLs (5 mg total) by mouth at bedtime. 150 mL 1   Cholecalciferol 50 MCG (2000 UT) TABS 1 tab by mouth once daily 30 tablet 99   furosemide (LASIX) 40 MG tablet TAKE 1 AND 1/2 TABLETS(60 MG) BY MOUTH TWICE DAILY 270 tablet 2   ipratropium (ATROVENT) 0.06 % nasal spray Place 2 sprays into both nostrils 3 (three) times daily. As needed for nasal congestion, runny nose 15 mL 1   metoprolol succinate (TOPROL-XL) 50 MG 24 hr tablet Take 1 tablet (50 mg total) by mouth daily. Take with or immediately following a meal. 90 tablet 2   pantoprazole (PROTONIX) 40 MG tablet Take 1 tablet (40 mg total) by mouth daily. 90 tablet 3   PFIZER-BIONTECH COVID-19 VACC 30 MCG/0.3ML injection      promethazine-dextromethorphan (PROMETHAZINE-DM) 6.25-15 MG/5ML syrup Take 2.5 mLs by mouth 4 (four) times daily as needed for cough. 60 mL 0   tamsulosin (FLOMAX) 0.4 MG CAPS capsule TAKE 1 CAPSULE(0.4 MG) BY MOUTH TWICE DAILY 180 capsule 3   tiZANidine (ZANAFLEX) 2 MG tablet Take 1 tablet (2 mg total) by mouth every 8 (eight) hours as needed for muscle spasms. 30 tablet 0   triamcinolone (NASACORT) 55 MCG/ACT AERO nasal inhaler Place 2 sprays into the nose daily. 1 each 12   vitamin B-12 (CYANOCOBALAMIN) 1000 MCG tablet Take 1 tablet (1,000 mcg total) by mouth daily. 90 tablet 1   No current facility-administered medications on file prior to visit.        ROS:  All others reviewed and negative.  Objective        PE:  BP 118/70 (BP Location: Right Arm, Patient Position: Sitting, Cuff Size: Normal)   Pulse 65   Temp 97.6 F (36.4 C) (Oral)   Ht 5\' 9"  (1.753 m)   Wt  272 lb (123.4 kg)   SpO2 98%   BMI 40.17 kg/m                 Constitutional: Pt appears in NAD               HENT: Head: NCAT.                Right Ear: External ear normal.                 Left Ear: External ear normal.  Eyes: . Pupils are equal, round, and reactive to light. Conjunctivae and EOM are normal               Nose: without d/c or deformity               Neck: Neck supple. Gross normal ROM               Cardiovascular: Normal rate and regular rhythm.                 Pulmonary/Chest: Effort normal and breath sounds without rales or wheezing.                Abd:  Soft, NT, ND, + BS, no organomegaly               Neurological: Pt is alert. At baseline orientation, motor grossly intact               Skin: Skin is warm. Numerous small gray brown lesions to post hands sligthly raised, nontender,  LE edema - none               Psychiatric: Pt behavior is normal without agitation   Micro: none  Cardiac tracings I have personally interpreted today:  none  Pertinent Radiological findings (summarize): none   Lab Results  Component Value Date   WBC 4.6 10/29/2022   HGB 12.1 (L) 10/29/2022   HCT 38.1 (L) 10/29/2022   PLT 214.0 10/29/2022   GLUCOSE 130 (H) 10/29/2022   CHOL 145 10/29/2022   TRIG 69.0 10/29/2022   HDL 44.80 10/29/2022   LDLDIRECT 156.9 10/10/2010   LDLCALC 86 10/29/2022   ALT 9 10/29/2022   AST 15 10/29/2022   NA 143 10/29/2022   K 3.5 10/29/2022   CL 104 10/29/2022   CREATININE 2.01 (H) 10/29/2022   BUN 25 (H) 10/29/2022   CO2 29 10/29/2022   TSH 2.14 10/29/2022   PSA 1.09 10/29/2022   HGBA1C 6.0 10/29/2022   MICROALBUR 25.7 (H) 10/29/2022   Assessment/Plan:  Troy Palmer is a 77 y.o. Black or African American [2] male with  has a past medical history of ALLERGIC RHINITIS (04/09/2007), Allergy, ASTHMA (12/04/2007), BACK PAIN (09/16/2009), BENIGN PROSTATIC HYPERTROPHY (04/09/2007), CEREBROVASCULAR ACCIDENT, HX OF (07/17/2010), COLONIC  POLYPS, HX OF (12/04/2007), CONSTIPATION (09/25/2010), Degeneration of cervical intervertebral disc (03/28/2007), DEPRESSION (12/04/2007), DIVERTICULOSIS, COLON (12/04/2007), DYSPHAGIA UNSPECIFIED (03/16/2008), ERECTILE DYSFUNCTION (04/09/2007), ESOPHAGEAL STRICTURE (05/26/2008), GERD (12/04/2007), HEMORRHOIDS, RECURRENT (02/11/2008), HYPERLIPIDEMIA (12/04/2007), HYPERTENSION (03/28/2007), LEG PAIN, BILATERAL (01/25/2010), LOW BACK PAIN (04/09/2007), LUNG NODULE (12/04/2007), Osteoarth NOS-Unspec (03/28/2007), Other dysphagia (11/21/2009), Peripheral neuropathy (05/29/2018), POLYARTHRALGIA (07/13/2008), Polymyalgia rheumatica ([place]) (07/26/2008), PVD WITH CLAUDICATION (02/10/2010), RENAL INSUFFICIENCY (03/15/2010), Sleep apnea, SLEEP APNEA, OBSTRUCTIVE, MODERATE (04/09/2007), Stroke ([place]), and TB SKIN TEST, POSITIVE (03/28/2007).  Encounter for well adult exam with abnormal findings Age and sex appropriate education and counseling updated with regular exercise and diet Referrals for preventative services - none needed Immunizations addressed - for tdap and shingrix at the pharmacy Smoking counseling  - none needed Evidence for depression or other mood disorder - none significant Most recent labs reviewed. I have personally reviewed and have noted: 1) the patient's medical and social history 2) The patient's current medications and supplements 3) The patient's height, weight, and BMI have been recorded in the chart   Polymyalgia rheumatica With perisstent pain, also for ESR with labs  Anemia Mild, no recent overt bleeding or bruising - for iron level with labs  B12 deficiency Lab Results  Component Value Date   [id number] 10/29/2022   Stable, cont oral replacement - b12 1000 mcg qd   Vitamin D deficiency Last vitamin D Lab Results  Component Value Date   VD25OH 30.68 10/29/2022   Low, to start oral replacement   Exposure to STD Pt encouraged for safe sex habits, for SDT testing today with  labs  Rash of both hands Pt to f/u with dermatology  Hypercholesterolemia Lab Results  Component Value Date   [place] 86 10/29/2022   Mild uncontrolled, goal ldl < 70,, pt to continue current statin lipitor 80 mg qd, declines add other such as zetia today   Impaired glucose tolerance Lab Results  Component Value Date   HGBA1C 6.0 10/29/2022   Stable, pt to continue current medical treatment  - diet, wt control  Followup: No follow-ups on file.  Cathlean Cower, MD 10/29/2022 3:24 PM [facility]

## 2022-10-29 NOTE — Assessment & Plan Note (Signed)
With perisstent pain, also for ESR with labs

## 2022-10-29 NOTE — Patient Instructions (Addendum)
Please have your Tdap (tetanus) and Shingles shots done at the pharmacy  Please continue all other medications as before, and refills have been done if requested.  Please have the pharmacy call with any other refills you may need.  Please continue your efforts at being more active, low cholesterol diet, and weight control.  You are otherwise up to date with prevention measures today.  Please keep your appointments with your specialists as you may have planned  Please go to the LAB at the blood drawing area for the tests to be done  You will be contacted by phone if any changes need to be made immediately.  Otherwise, you will receive a letter about your results with an explanation, but please check with MyChart first.  Please remember to sign up for MyChart if you have not done so, as this will be important to you in the future with finding out test results, communicating by private email, and scheduling acute appointments online when needed.  Please make an Appointment to return in May 29, or sooner if needed

## 2022-10-29 NOTE — Assessment & Plan Note (Signed)
Mild, no recent overt bleeding or bruising - for iron level with labs

## 2022-10-29 NOTE — Assessment & Plan Note (Signed)
Age and sex appropriate education and counseling updated with regular exercise and diet Referrals for preventative services - none needed Immunizations addressed - for tdap and shingrix at the pharmacy Smoking counseling  - none needed Evidence for depression or other mood disorder - none significant Most recent labs reviewed. I have personally reviewed and have noted: 1) the patient's medical and social history 2) The patient's current medications and supplements 3) The patient's height, weight, and BMI have been recorded in the chart  

## 2022-10-29 NOTE — Assessment & Plan Note (Signed)
Pt encouraged for safe sex habits, for SDT testing today with labs

## 2022-10-29 NOTE — Assessment & Plan Note (Signed)
Last vitamin D Lab Results  Component Value Date   VD25OH 30.68 10/29/2022   Low, to start oral replacement

## 2022-10-29 NOTE — Assessment & Plan Note (Signed)
Lab Results  Component Value Date   LDLCALC 86 10/29/2022   Mild uncontrolled, goal ldl < 70,, pt to continue current statin lipitor 80 mg qd, declines add other such as zetia today

## 2022-10-29 NOTE — Assessment & Plan Note (Signed)
Lab Results  Component Value Date   HGBA1C 6.0 10/29/2022   Stable, pt to continue current medical treatment  - diet, wt control

## 2022-10-31 LAB — HIV ANTIBODY (ROUTINE TESTING W REFLEX): HIV 1&2 Ab, 4th Generation: NONREACTIVE

## 2022-10-31 LAB — RPR: RPR Ser Ql: NONREACTIVE

## 2022-10-31 LAB — VARICELLA ZOSTER ANTIBODY, IGG: Varicella IgG: 702.6 index

## 2022-10-31 LAB — HSV 2 ANTIBODY, IGG: HSV 2 Glycoprotein G Ab, IgG: 6.01 index — ABNORMAL HIGH

## 2022-11-01 LAB — GC/CHLAMYDIA PROBE AMP
Chlamydia trachomatis, NAA: NEGATIVE
Neisseria Gonorrhoeae by PCR: NEGATIVE

## 2022-11-06 DIAGNOSIS — I10 Essential (primary) hypertension: Secondary | ICD-10-CM | POA: Diagnosis not present

## 2022-11-06 DIAGNOSIS — E669 Obesity, unspecified: Secondary | ICD-10-CM | POA: Diagnosis not present

## 2022-11-27 ENCOUNTER — Other Ambulatory Visit: Payer: Self-pay | Admitting: Internal Medicine

## 2022-11-28 ENCOUNTER — Telehealth: Payer: Self-pay | Admitting: Internal Medicine

## 2022-12-04 ENCOUNTER — Other Ambulatory Visit: Payer: Self-pay

## 2022-12-04 MED ORDER — METOPROLOL SUCCINATE ER 50 MG PO TB24: 50.0000 mg | ORAL_TABLET | Freq: Every day | ORAL | 2 refills | Status: DC

## 2022-12-05 ENCOUNTER — Telehealth: Payer: Self-pay | Admitting: Internal Medicine

## 2022-12-05 NOTE — Telephone Encounter (Signed)
Called Pt and let him know refill was sent in yesterday.

## 2022-12-05 NOTE — Telephone Encounter (Signed)
Prescription Request  12/05/2022  LOV: 10/29/2022  What is the name of the medication or equipment? metoprolol  Have you contacted your pharmacy to request a refill? Yes   Which pharmacy would you like this sent to?  [id number] [place], [place] - [address] AT [facility] [address] [facility] [address] [place] [address] Phone: [phone] Fax: [phone]     Patient notified that their request is being sent to the clinical staff for review and that they should receive a response within 2 business days.   Please advise at Mobile [phone] (mobile)

## 2022-12-20 DIAGNOSIS — Z6839 Body mass index (BMI) 39.0-39.9, adult: Secondary | ICD-10-CM | POA: Diagnosis not present

## 2022-12-20 DIAGNOSIS — M79641 Pain in right hand: Secondary | ICD-10-CM | POA: Diagnosis not present

## 2022-12-20 DIAGNOSIS — M79642 Pain in left hand: Secondary | ICD-10-CM | POA: Diagnosis not present

## 2022-12-20 DIAGNOSIS — D8989 Other specified disorders involving the immune mechanism, not elsewhere classified: Secondary | ICD-10-CM | POA: Diagnosis not present

## 2022-12-20 DIAGNOSIS — M2540 Effusion, unspecified joint: Secondary | ICD-10-CM | POA: Diagnosis not present

## 2022-12-20 DIAGNOSIS — R768 Other specified abnormal immunological findings in serum: Secondary | ICD-10-CM | POA: Diagnosis not present

## 2022-12-20 DIAGNOSIS — R21 Rash and other nonspecific skin eruption: Secondary | ICD-10-CM | POA: Diagnosis not present

## 2022-12-20 DIAGNOSIS — E669 Obesity, unspecified: Secondary | ICD-10-CM | POA: Diagnosis not present

## 2022-12-25 DIAGNOSIS — Z6841 Body Mass Index (BMI) 40.0 and over, adult: Secondary | ICD-10-CM | POA: Diagnosis not present

## 2022-12-25 DIAGNOSIS — E669 Obesity, unspecified: Secondary | ICD-10-CM | POA: Diagnosis not present

## 2022-12-25 DIAGNOSIS — M109 Gout, unspecified: Secondary | ICD-10-CM | POA: Diagnosis not present

## 2022-12-25 DIAGNOSIS — N183 Chronic kidney disease, stage 3 unspecified: Secondary | ICD-10-CM | POA: Diagnosis not present

## 2022-12-25 DIAGNOSIS — I129 Hypertensive chronic kidney disease with stage 1 through stage 4 chronic kidney disease, or unspecified chronic kidney disease: Secondary | ICD-10-CM | POA: Diagnosis not present

## 2023-01-02 ENCOUNTER — Other Ambulatory Visit: Payer: Self-pay | Admitting: Internal Medicine

## 2023-01-09 ENCOUNTER — Ambulatory Visit: Payer: Medicare PPO | Admitting: Internal Medicine

## 2023-01-15 ENCOUNTER — Telehealth: Payer: Self-pay | Admitting: Internal Medicine

## 2023-01-15 ENCOUNTER — Ambulatory Visit: Payer: Medicare PPO | Admitting: Internal Medicine

## 2023-01-15 MED ORDER — ALLOPURINOL 100 MG PO TABS
100.0000 mg | ORAL_TABLET | Freq: Every day | ORAL | 2 refills | Status: DC
Start: 2023-01-15 — End: 2023-09-18

## 2023-01-15 NOTE — Telephone Encounter (Signed)
Ok done - allopurinol 100 qd

## 2023-01-15 NOTE — Telephone Encounter (Signed)
Pt called wanting his Rx refilled for his gout medication he could not tell me the name or spell it.

## 2023-02-11 ENCOUNTER — Other Ambulatory Visit: Payer: Self-pay

## 2023-02-11 MED ORDER — TAMSULOSIN HCL 0.4 MG PO CAPS: ORAL_CAPSULE | ORAL | 3 refills | Status: DC

## 2023-02-11 MED ORDER — FUROSEMIDE 40 MG PO TABS: ORAL_TABLET | ORAL | 2 refills | Status: DC

## 2023-02-21 ENCOUNTER — Other Ambulatory Visit: Payer: Self-pay | Admitting: Internal Medicine

## 2023-03-15 ENCOUNTER — Emergency Department (HOSPITAL_BASED_OUTPATIENT_CLINIC_OR_DEPARTMENT_OTHER)
Admission: EM | Admit: 2023-03-15 | Discharge: 2023-03-15 | Disposition: A | Discharge status: Home / Self Care | Payer: Medicare PPO | Attending: Emergency Medicine | Admitting: Emergency Medicine

## 2023-03-15 ENCOUNTER — Other Ambulatory Visit: Payer: Self-pay

## 2023-03-15 ENCOUNTER — Emergency Department (HOSPITAL_BASED_OUTPATIENT_CLINIC_OR_DEPARTMENT_OTHER): Payer: Medicare PPO | Admitting: Radiology

## 2023-03-15 ENCOUNTER — Encounter (HOSPITAL_BASED_OUTPATIENT_CLINIC_OR_DEPARTMENT_OTHER): Payer: Self-pay | Admitting: Emergency Medicine

## 2023-03-15 DIAGNOSIS — I517 Cardiomegaly: Secondary | ICD-10-CM | POA: Diagnosis not present

## 2023-03-15 DIAGNOSIS — X58XXXA Exposure to other specified factors, initial encounter: Secondary | ICD-10-CM | POA: Insufficient documentation

## 2023-03-15 DIAGNOSIS — R0789 Other chest pain: Secondary | ICD-10-CM | POA: Diagnosis not present

## 2023-03-15 DIAGNOSIS — M25512 Pain in left shoulder: Secondary | ICD-10-CM | POA: Diagnosis present

## 2023-03-15 DIAGNOSIS — I10 Essential (primary) hypertension: Secondary | ICD-10-CM | POA: Insufficient documentation

## 2023-03-15 DIAGNOSIS — Z79899 Other long term (current) drug therapy: Secondary | ICD-10-CM | POA: Diagnosis not present

## 2023-03-15 DIAGNOSIS — Z8673 Personal history of transient ischemic attack (TIA), and cerebral infarction without residual deficits: Secondary | ICD-10-CM | POA: Insufficient documentation

## 2023-03-15 DIAGNOSIS — R079 Chest pain, unspecified: Secondary | ICD-10-CM | POA: Diagnosis not present

## 2023-03-15 DIAGNOSIS — S46012A Strain of muscle(s) and tendon(s) of the rotator cuff of left shoulder, initial encounter: Secondary | ICD-10-CM | POA: Diagnosis not present

## 2023-03-15 DIAGNOSIS — J45909 Unspecified asthma, uncomplicated: Secondary | ICD-10-CM | POA: Diagnosis not present

## 2023-03-15 DIAGNOSIS — S46912A Strain of unspecified muscle, fascia and tendon at shoulder and upper arm level, left arm, initial encounter: Secondary | ICD-10-CM | POA: Diagnosis not present

## 2023-03-15 DIAGNOSIS — I700 Atherosclerosis of aorta: Secondary | ICD-10-CM | POA: Diagnosis not present

## 2023-03-15 DIAGNOSIS — M62830 Muscle spasm of back: Secondary | ICD-10-CM | POA: Insufficient documentation

## 2023-03-15 LAB — BASIC METABOLIC PANEL
Anion gap: 8 (ref 5–15)
BUN: 29 mg/dL — ABNORMAL HIGH (ref 8–23)
CO2: 26 mmol/L (ref 22–32)
Calcium: 8.9 mg/dL (ref 8.9–10.3)
Chloride: 106 mmol/L (ref 98–111)
Creatinine, Ser: 2.39 mg/dL — ABNORMAL HIGH (ref 0.61–1.24)
GFR, Estimated: 27 mL/min — ABNORMAL LOW (ref >60)
Glucose, Bld: 98 mg/dL (ref 70–99)
Potassium: 4.4 mmol/L (ref 3.5–5.1)
Sodium: 140 mmol/L (ref 135–145)

## 2023-03-15 LAB — CBC
HCT: 35.4 % — ABNORMAL LOW (ref 39.0–52.0)
Hemoglobin: 11.4 g/dL — ABNORMAL LOW (ref 13.0–17.0)
MCH: 27 pg (ref 26.0–34.0)
MCHC: 32.2 g/dL (ref 30.0–36.0)
MCV: 83.7 fL (ref 80.0–100.0)
Platelets: 222 10*3/uL (ref 150–400)
RBC: 4.23 MIL/uL (ref 4.22–5.81)
RDW: 14.6 % (ref 11.5–15.5)
WBC: 4 10*3/uL (ref 4.0–10.5)
nRBC: 0 % (ref 0.0–0.2)

## 2023-03-15 LAB — TROPONIN I (HIGH SENSITIVITY): Troponin I (High Sensitivity): 6 ng/L (ref <18)

## 2023-03-15 MED ORDER — METHOCARBAMOL 500 MG PO TABS: 1000.0000 mg | ORAL_TABLET | Freq: Every evening | ORAL | 0 refills | Status: DC | PRN

## 2023-03-15 NOTE — ED Provider Notes (Signed)
[place] EMERGENCY DEPARTMENT AT [facility] Provider Note  CSN: [id number] Arrival date & time: 03/15/23 0425  Chief Complaint(s) Chest Pain and Back Pain  HPI Troy Palmer is a 77 y.o. male with a past medical history listed below who presents to the emergency department with worsening of persistent left shoulder girdle area pain radiating to the chest.  He states that this has been ongoing for several years intermittently.  This episode has been constant for at least 4 days.  However last night the pain became significantly worse while sleeping which woke him up.  Pain is worse with range of motion or palpation of the left shoulder and arm.  He also endorses worsening of the pain with breathing.  No primary chest pain.  No abdominal pain.  No nausea or vomiting.  No recent fevers or infections.  No new or worsening cough.  The history is provided by the patient.    Past Medical History Past Medical History:  Diagnosis Date   ALLERGIC RHINITIS 04/09/2007   Allergy    ASTHMA 12/04/2007   BACK PAIN 09/16/2009   BENIGN PROSTATIC HYPERTROPHY 04/09/2007   CEREBROVASCULAR ACCIDENT, HX OF 07/17/2010   COLONIC POLYPS, HX OF 12/04/2007   CONSTIPATION 09/25/2010   Degeneration of cervical intervertebral disc 03/28/2007   DEPRESSION 12/04/2007   DIVERTICULOSIS, COLON 12/04/2007   DYSPHAGIA UNSPECIFIED 03/16/2008   ERECTILE DYSFUNCTION 04/09/2007   ESOPHAGEAL STRICTURE 05/26/2008   GERD 12/04/2007   HEMORRHOIDS, RECURRENT 02/11/2008   HYPERLIPIDEMIA 12/04/2007   HYPERTENSION 03/28/2007   LEG PAIN, BILATERAL 01/25/2010   LOW BACK PAIN 04/09/2007   LUNG NODULE 12/04/2007   Osteoarth NOS-Unspec 03/28/2007   Other dysphagia 11/21/2009   Peripheral neuropathy 05/29/2018   POLYARTHRALGIA 07/13/2008   Polymyalgia rheumatica (HCC) 07/26/2008   PVD WITH CLAUDICATION 02/10/2010   RENAL INSUFFICIENCY 03/15/2010   Sleep apnea    no cpap    SLEEP APNEA, OBSTRUCTIVE, MODERATE 04/09/2007   Stroke (HCC)     TIA    TB SKIN TEST, POSITIVE 03/28/2007   Patient Active Problem List   Diagnosis Date Noted   Exposure to STD 10/29/2022   Rash of both hands 08/30/2022   Rash 07/10/2022   Acute sinus infection 12/24/2021   Nasal polyp 12/24/2021   Anemia 10/04/2021   Chronic pain of left thumb 06/11/2021   Screen for colon cancer 06/11/2021   Allergic rhinitis 10/31/2020   Elevated PTHrP level 10/31/2020   Aortic atherosclerosis (HCC) 05/11/2020   Dysuria 03/09/2020   Constipation 03/09/2020   Vitamin D deficiency 12/05/2019   B12 deficiency 12/05/2019   Contact dermatitis 09/08/2019   Left knee pain 06/04/2019   Neck pain 02/23/2019   Recurrent falls 08/19/2018   Abdominal pain 07/25/2018   Peripheral neuropathy 05/29/2018   Paresthesia of bilateral legs 05/05/2018   Cough variant asthma  vs UACS assoc with pnds 02/27/2018   Asthmatic bronchitis 02/10/2018   Acute respiratory failure with hypoxia (HCC) 02/09/2018   Pain, dental 02/03/2018   Cough 02/03/2018   Wheezing 02/03/2018   Pain and swelling of lower leg, right 12/31/2017   BPH with obstruction/lower urinary tract symptoms 05/16/2017   Bilateral ankle pain 05/16/2017   DOE (dyspnea on exertion) 11/16/2016   Complete tear of right rotator cuff 06/26/2016   Cellulitis of deltoid region 06/26/2016   Gout 08/18/2015   Congestive heart failure (CHF) (HCC) 08/18/2015   Spinal stenosis of lumbar region at multiple levels 08/18/2015   Greater trochanteric bursitis of right hip  05/05/2014   Right hip pain 05/04/2014   Posterior neck pain 12/22/2013   Orchitis, right 12/22/2013   Paresthesia of both feet 12/22/2013   Acute pain of left shoulder 09/16/2013   Allergic angioedema 07/31/2012   Impaired glucose tolerance 04/15/2012   Chest pain 02/24/2012   Foot pain, bilateral 02/20/2012   Leg pain, bilateral 01/19/2012   Edema 01/16/2012   Dysphagia 11/21/2011   Gout 06/07/2011   CKD (chronic kidney disease) stage 4, GFR 15-29  ml/min (HCC) 06/07/2011   Encounter for well adult exam with abnormal findings 02/14/2011   Encounter for long-term (current) use of other medications 11/08/2010   CONSTIPATION 09/25/2010   Memory loss 06/28/2010   PVD (peripheral vascular disease) (HCC) 02/10/2010   LEG PAIN, BILATERAL 01/25/2010   Epigastric pain 11/21/2009   Backache 09/16/2009   Polymyalgia rheumatica (HCC) 07/26/2008   POLYARTHRALGIA 07/13/2008   ESOPHAGEAL STRICTURE 05/26/2008   HEMORRHOIDS, RECURRENT 02/11/2008   Hypercholesterolemia 12/04/2007   DEPRESSION 12/04/2007   Asthma 12/04/2007   LUNG NODULE 12/04/2007   Gastroesophageal reflux disease without esophagitis 12/04/2007   DIVERTICULOSIS, COLON 12/04/2007   COLONIC POLYPS, HX OF 12/04/2007   ERECTILE DYSFUNCTION 04/09/2007   SLEEP APNEA, OBSTRUCTIVE, MODERATE 04/09/2007   Atopic rhinitis 04/09/2007   BENIGN PROSTATIC HYPERTROPHY 04/09/2007   LOW BACK PAIN 04/09/2007   Morbid obesity due to excess calories (HCC) 03/28/2007   Essential hypertension 03/28/2007   Osteoarthritis of multiple joints 03/28/2007   Degeneration of cervical intervertebral disc 03/28/2007   TB SKIN TEST, POSITIVE 03/28/2007   Home Medication(s) Prior to Admission medications   Medication Sig Start Date End Date Taking? Authorizing Provider  methocarbamol (ROBAXIN) 500 MG tablet Take 2 tablets (1,000 mg total) by mouth at bedtime as needed for muscle spasms. 03/15/23  Yes , Amadeo Garnet, MD  acetaminophen (TYLENOL) 650 MG CR tablet Take 1 tablet (650 mg total) by mouth every 6 (six) hours as needed (back pain). 03/02/21   Fayrene Helper, PA-C  allopurinol (ZYLOPRIM) 100 MG tablet Take 1 tablet (100 mg total) by mouth daily. 01/15/23   Corwin Levins, MD  amLODipine (NORVASC) 5 MG tablet TAKE 1 TABLET(5 MG) BY MOUTH DAILY 10/15/22   Corwin Levins, MD  atorvastatin (LIPITOR) 80 MG tablet TAKE 1 TABLET(80 MG) BY MOUTH DAILY 11/27/22   Corwin Levins, MD  cetirizine HCl (ZYRTEC) 1 MG/ML  solution Take 5 mLs (5 mg total) by mouth at bedtime. 09/27/22   Theadora Rama Scales, PA-C  Cholecalciferol 50 MCG (2000 UT) TABS 1 tab by mouth once daily 10/04/21   Corwin Levins, MD  furosemide (LASIX) 40 MG tablet TAKE 1 AND 1/2 TABLETS(60 MG) BY MOUTH TWICE DAILY 02/11/23   Corwin Levins, MD  ipratropium (ATROVENT) 0.06 % nasal spray Place 2 sprays into both nostrils 3 (three) times daily. As needed for nasal congestion, runny nose 09/27/22   Theadora Rama Scales, PA-C  metoprolol succinate (TOPROL-XL) 50 MG 24 hr tablet Take 1 tablet (50 mg total) by mouth daily. Take with or immediately following a meal. 12/04/22   Corwin Levins, MD  pantoprazole (PROTONIX) 40 MG tablet TAKE 1 TABLET(40 MG) BY MOUTH DAILY 01/02/23   Corwin Levins, MD  PFIZER-BIONTECH COVID-19 [facility] 30 MCG/0.3ML injection  08/16/20   [provider]  promethazine-dextromethorphan (PROMETHAZINE-DM) 6.25-15 MG/5ML syrup Take 2.5 mLs by mouth 4 (four) times daily as needed for cough. 09/27/22   Theadora Rama Scales, PA-C  tamsulosin (FLOMAX) 0.4 MG CAPS capsule  TAKE 1 CAPSULE(0.4 MG) BY MOUTH TWICE DAILY 02/21/23   Corwin Levins, MD  triamcinolone (NASACORT) 55 MCG/ACT AERO nasal inhaler Place 2 sprays into the nose daily. 10/04/21   Corwin Levins, MD  vitamin B-12 (CYANOCOBALAMIN) 1000 MCG tablet Take 1 tablet (1,000 mcg total) by mouth daily. 12/03/19   Corwin Levins, MD                                                                                                                                    Allergies Ace inhibitors, Augmentin [amoxicillin-pot clavulanate], Doxycycline, and Sulfa antibiotics  Review of Systems Review of Systems As noted in HPI  Physical Exam Vital Signs  I have reviewed the triage vital signs BP (!) 130/55   Pulse (!) 51   Temp 98 F (36.7 C) (Oral)   Resp 11   Wt 113.4 kg   SpO2 97%   BMI 36.92 kg/m   Physical Exam Vitals reviewed.  Constitutional:      General: He is not in acute  distress.    Appearance: He is well-developed. He is not diaphoretic.  HENT:     Head: Normocephalic and atraumatic.     Nose: Nose normal.  Eyes:     General: No scleral icterus.       Right eye: No discharge.        Left eye: No discharge.     Conjunctiva/sclera: Conjunctivae normal.     Pupils: Pupils are equal, round, and reactive to light.  Cardiovascular:     Rate and Rhythm: Normal rate and regular rhythm.     Heart sounds: No murmur heard.    No friction rub. No gallop.  Pulmonary:     Effort: Pulmonary effort is normal. No respiratory distress.     Breath sounds: Normal breath sounds. No stridor. No rales.  Chest:     Chest wall: Tenderness present.    Abdominal:     General: There is no distension.     Palpations: Abdomen is soft.     Tenderness: There is no abdominal tenderness.  Musculoskeletal:     Cervical back: Normal range of motion and neck supple.     Thoracic back: Spasms and tenderness present.       Back:  Skin:    General: Skin is warm and dry.     Findings: No erythema or rash.  Neurological:     Mental Status: He is alert and oriented to person, place, and time.     ED Results and Treatments Labs (all labs ordered are listed, but only abnormal results are displayed) Labs Reviewed  BASIC METABOLIC PANEL - Abnormal; Notable for the following components:      Result Value   BUN 29 (*)    Creatinine, Ser 2.39 (*)    GFR, Estimated 27 (*)    All other components within normal limits  CBC - Abnormal;  Notable for the following components:   Hemoglobin 11.4 (*)    HCT 35.4 (*)    All other components within normal limits  TROPONIN I (HIGH SENSITIVITY)                                                                                                                         EKG  EKG Interpretation Date/Time:  Friday March 15 2023 04:39:27 EDT Ventricular Rate:  62 PR Interval:  192 QRS Duration:  88 QT Interval:  408 QTC Calculation: 414 R  Axis:   17  Text Interpretation: Normal sinus rhythm Anterior infarct (cited on or before 19-Feb-2018) Abnormal ECG When compared with ECG of 19-Feb-2018 17:45, No significant change was found Confirmed by Drema Pry [id number]) on 03/15/2023 6:04:15 AM       Radiology DG Chest 2 View  Result Date: 03/15/2023 CLINICAL DATA:  Chest pain. EXAM: CHEST - 2 VIEW COMPARISON:  03/01/2021. FINDINGS: The heart is mildly enlarged and mediastinal contours are within normal limits. There is atherosclerotic calcification of the aorta. No consolidation, effusion, or pneumothorax. Degenerative changes are present in the thoracic spine. IMPRESSION: No active cardiopulmonary disease. Electronically Signed   By: Thornell Sartorius M.D.   On: 03/15/2023 05:00    Medications Ordered in ED Medications - No data to display Procedures Procedures  (including critical care time) Medical Decision Making / ED Course   Medical Decision Making Amount and/or Complexity of Data Reviewed Labs: ordered. Decision-making details documented in ED Course. Radiology: ordered and independent interpretation performed. Decision-making details documented in ED Course. ECG/medicine tests: ordered and independent interpretation performed. Decision-making details documented in ED Course.  Risk Prescription drug management.    Left thoracic pain Differential considered listed below  Highly atypical for ACS but given patient's age and comorbidities, EKG and troponins were obtained.  EKG without acute ischemic changes or evidence of pericarditis.  Troponin negative.  Given the duration of pain and atypical nature, if it is sufficient to rule out ACS.  Presentation not classic for aortic dissection or esophageal perforation and chest x-ray without evidence of widened mediastinum concerning for it.  No evidence of pneumothorax.  No pneumonia.  No pleural effusions or pulmonary edema.  Patient is not tachycardic and satting at 100% on  room air.  Low suspicion for pulmonary embolism.  CBC without leukocytosis or severe anemia. Metabolic panel without electrolyte derangements.  Stable renal insufficiency without AKI.  Presentation is most suspicious for muscle strain/spasm of the left shoulder girdle musculature.  Supportive management recommended.    Final Clinical Impression(s) / ED Diagnoses Final diagnoses:  Muscle strain, shoulder region, left, initial encounter   The patient appears reasonably screened and/or stabilized for discharge and I doubt any other medical condition or other [facility] requiring further screening, evaluation, or treatment in the ED at this time. I have discussed the findings, Dx and Tx plan with the patient/family who expressed understanding and agree(s) with the plan. Discharge instructions discussed  at length. The patient/family was given strict return precautions who verbalized understanding of the instructions. No further questions at time of discharge.  Disposition: Discharge  Condition: Good  ED Discharge Orders          Ordered    methocarbamol (ROBAXIN) 500 MG tablet  At bedtime PRN        03/15/23 1610              Follow Up: Corwin Levins, MD [address] [place] [address] [phone]  Call  to schedule an appointment for close follow up    This chart was dictated using voice recognition software.  Despite best efforts to proofread,  errors can occur which can change the documentation meaning.    Nira Conn, MD 03/15/23 0700

## 2023-03-15 NOTE — ED Notes (Signed)
Patient verbalizes understanding of discharge instructions. Opportunity for questioning and answers were provided. Armband removed by staff, pt discharged from ED. Wheeled out to lobby  

## 2023-03-15 NOTE — Discharge Instructions (Signed)
You may use over-the-counter Acetaminophen (Tylenol), topical muscle creams such as SalonPas, Icy Hot, Bengay, etc. Please stretch, apply ice or heat (whichever helps), and have massage therapy for additional assistance.  

## 2023-03-15 NOTE — ED Triage Notes (Signed)
R upper back and L rib pain that radiates into L shoulder, woke him sleep this AM. Endorses belching for over a month  Denies N/V, diaphoresis, dizziness  H/o htn   Tried tylenol for pain, gasx as well

## 2023-03-18 ENCOUNTER — Ambulatory Visit: Payer: Medicare PPO | Admitting: Internal Medicine

## 2023-03-18 VITALS — BP 118/72 | HR 62 | Temp 97.7°F | Ht 69.0 in | Wt 252.8 lb

## 2023-03-18 DIAGNOSIS — E7800 Pure hypercholesterolemia, unspecified: Secondary | ICD-10-CM

## 2023-03-18 DIAGNOSIS — E559 Vitamin D deficiency, unspecified: Secondary | ICD-10-CM | POA: Diagnosis not present

## 2023-03-18 DIAGNOSIS — Z125 Encounter for screening for malignant neoplasm of prostate: Secondary | ICD-10-CM | POA: Diagnosis not present

## 2023-03-18 DIAGNOSIS — E538 Deficiency of other specified B group vitamins: Secondary | ICD-10-CM

## 2023-03-18 DIAGNOSIS — Z0000 Encounter for general adult medical examination without abnormal findings: Secondary | ICD-10-CM

## 2023-03-18 DIAGNOSIS — R1310 Dysphagia, unspecified: Secondary | ICD-10-CM | POA: Diagnosis not present

## 2023-03-18 DIAGNOSIS — J4520 Mild intermittent asthma, uncomplicated: Secondary | ICD-10-CM | POA: Diagnosis not present

## 2023-03-18 DIAGNOSIS — R7302 Impaired glucose tolerance (oral): Secondary | ICD-10-CM

## 2023-03-18 DIAGNOSIS — R079 Chest pain, unspecified: Secondary | ICD-10-CM

## 2023-03-18 DIAGNOSIS — R634 Abnormal weight loss: Secondary | ICD-10-CM

## 2023-03-18 LAB — POCT GLYCOSYLATED HEMOGLOBIN (HGB A1C): Hemoglobin A1C: 5.6 % (ref 4.0–5.6)

## 2023-03-18 NOTE — Patient Instructions (Signed)
Please have the Mychart app or use the browser on the phone for [email or web address] to let us know by Friday or after if you would want to try the gabapentin for possible left sided nerve pain  Your Username is RATT, with a PW ZION  Please continue all other medications as before, and refills have been done if requested.  Please have the pharmacy call with any other refills you may need.  Please continue your efforts at being more active, low cholesterol diet, and weight control.  Please keep your appointments with your specialists as you may have planned  You will be contacted regarding the referral for: Gastroenterology for the wt loss and trouble swallowing  Your A1c was done today  Please make an Appointment to return in 6 months, or sooner if needed, also with Lab Appointment for testing done 3-5 days before at the FIRST FLOOR Lab (so this is for TWO appointments - please see the scheduling desk as you leave)

## 2023-03-18 NOTE — Progress Notes (Unsigned)
Patient ID: Troy Palmer, male   DOB: 01-29-1946, 77 y.o.   MRN: [id number]        Chief Complaint: follow up dysphagia with wt loss, hyperglycemia, left chest and side pain, asthma, ow vit d and b12       HPI:  Troy Palmer is a 77 y.o. male here with recent wt loss, - Wt down 20 lbs from 6 mo ago, has less appetite and full faster, but also with mild dysphagia recurring with eating solids similar to previous, has hx of esophageal stricture.  Denies worsening reflux, abd pain, n/v, bowel change or blood.  Pt denies chest pain, increased sob or doe, wheezing, orthopnea, PND, increased LE swelling, palpitations, dizziness or syncope, except for pain starting left mid thoracic paraspinal with radiation dermatomal fashion around the left chest to the LUQ,   Pt denies polydipsia, polyuria, or new focal neuro s/s.       Wt Readings from Last 3 Encounters:  03/18/23 252 lb 12.8 oz (114.7 kg)  03/15/23 250 lb (113.4 kg)  10/29/22 272 lb (123.4 kg)   BP Readings from Last 3 Encounters:  03/18/23 118/72  03/15/23 (!) 130/55  10/29/22 118/70         Past Medical History:  Diagnosis Date   ALLERGIC RHINITIS 04/09/2007   Allergy    ASTHMA 12/04/2007   BACK PAIN 09/16/2009   BENIGN PROSTATIC HYPERTROPHY 04/09/2007   CEREBROVASCULAR ACCIDENT, HX OF 07/17/2010   COLONIC POLYPS, HX OF 12/04/2007   CONSTIPATION 09/25/2010   Degeneration of cervical intervertebral disc 03/28/2007   DEPRESSION 12/04/2007   DIVERTICULOSIS, COLON 12/04/2007   DYSPHAGIA UNSPECIFIED 03/16/2008   ERECTILE DYSFUNCTION 04/09/2007   ESOPHAGEAL STRICTURE 05/26/2008   GERD 12/04/2007   HEMORRHOIDS, RECURRENT 02/11/2008   HYPERLIPIDEMIA 12/04/2007   HYPERTENSION 03/28/2007   LEG PAIN, BILATERAL 01/25/2010   LOW BACK PAIN 04/09/2007   LUNG NODULE 12/04/2007   Osteoarth NOS-Unspec 03/28/2007   Other dysphagia 11/21/2009   Peripheral neuropathy 05/29/2018   POLYARTHRALGIA 07/13/2008   Polymyalgia rheumatica (HCC) 07/26/2008   PVD WITH  CLAUDICATION 02/10/2010   RENAL INSUFFICIENCY 03/15/2010   Sleep apnea    no cpap    SLEEP APNEA, OBSTRUCTIVE, MODERATE 04/09/2007   Stroke (HCC)    TIA    TB SKIN TEST, POSITIVE 03/28/2007   Past Surgical History:  Procedure Laterality Date   COLONOSCOPY     ROTATOR CUFF REPAIR Bilateral    TOTAL KNEE ARTHROPLASTY     x 2    reports that he has quit smoking. He has never used smokeless tobacco. He reports that he does not drink alcohol and does not use drugs. family history includes Asthma in an other family member; Healthy in his mother; Heart disease in his father; Hypertension in his brother; Lupus in his brother. Allergies  Allergen Reactions   Ace Inhibitors Swelling    Angioedema throat   Augmentin [Amoxicillin-Pot Clavulanate] Other (See Comments)    Marked weakness with po med x 3 doses   Doxycycline     Some GI upset   Sulfa Antibiotics Hives    And facial angioedema   Current Outpatient Medications on File Prior to Visit  Medication Sig Dispense Refill   acetaminophen (TYLENOL) 650 MG CR tablet Take 1 tablet (650 mg total) by mouth every 6 (six) hours as needed (back pain). 30 tablet 0   allopurinol (ZYLOPRIM) 100 MG tablet Take 1 tablet (100 mg total) by mouth daily. 90 tablet 2  amLODipine (NORVASC) 5 MG tablet TAKE 1 TABLET(5 MG) BY MOUTH DAILY 90 tablet 1   atorvastatin (LIPITOR) 80 MG tablet TAKE 1 TABLET(80 MG) BY MOUTH DAILY 90 tablet 3   cetirizine HCl (ZYRTEC) 1 MG/ML solution Take 5 mLs (5 mg total) by mouth at bedtime. 150 mL 1   Cholecalciferol 50 MCG (2000 UT) TABS 1 tab by mouth once daily 30 tablet 99   furosemide (LASIX) 40 MG tablet TAKE 1 AND 1/2 TABLETS(60 MG) BY MOUTH TWICE DAILY 270 tablet 2   ipratropium (ATROVENT) 0.06 % nasal spray Place 2 sprays into both nostrils 3 (three) times daily. As needed for nasal congestion, runny nose 15 mL 1   methocarbamol (ROBAXIN) 500 MG tablet Take 2 tablets (1,000 mg total) by mouth at bedtime as needed for muscle  spasms. 20 tablet 0   metoprolol succinate (TOPROL-XL) 50 MG 24 hr tablet Take 1 tablet (50 mg total) by mouth daily. Take with or immediately following a meal. 90 tablet 2   pantoprazole (PROTONIX) 40 MG tablet TAKE 1 TABLET(40 MG) BY MOUTH DAILY 90 tablet 3   PFIZER-BIONTECH COVID-19 V[facility] 30 MCG/0.3ML injection      promethazine-dextromethorphan (PROMETHAZINE-DM) 6.25-15 MG/5ML syrup Take 2.5 mLs by mouth 4 (four) times daily as needed for cough. 60 mL 0   tamsulosin (FLOMAX) 0.4 MG CAPS capsule TAKE 1 CAPSULE(0.4 MG) BY MOUTH TWICE DAILY 180 capsule 2   triamcinolone (NASACORT) 55 MCG/ACT AERO nasal inhaler Place 2 sprays into the nose daily. 1 each 12   vitamin B-12 (CYANOCOBALAMIN) 1000 MCG tablet Take 1 tablet (1,000 mcg total) by mouth daily. 90 tablet 1   No current facility-administered medications on file prior to visit.        ROS:  All others reviewed and negative.  Objective        PE:  BP 118/72 (BP Location: Left Arm, Patient Position: Sitting, Cuff Size: Normal)   Pulse 62   Temp 97.7 F (36.5 C) (Oral)   Ht 5\' 9"  (1.753 m)   Wt 252 lb 12.8 oz (114.7 kg)   SpO2 98%   BMI 37.33 kg/m                 Constitutional: Pt appears in NAD               HENT: Head: NCAT.                Right Ear: External ear normal.                 Left Ear: External ear normal.                Eyes: . Pupils are equal, round, and reactive to light. Conjunctivae and EOM are normal               Nose: without d/c or deformity               Neck: Neck supple. Gross normal ROM               Cardiovascular: Normal rate and regular rhythm.                 Pulmonary/Chest: Effort normal and breath sounds without rales or wheezing.                Abd:  Soft, NT, ND, + BS, no organomegaly               Neurological:  Pt is alert. At baseline orientation, motor grossly intact               Skin: Skin is warm. No rashes, no other new lesions, LE edema - none               Psychiatric: Pt behavior is  normal without agitation   Micro: none  Cardiac tracings I have personally interpreted today:  none  Pertinent Radiological findings (summarize): none   Lab Results  Component Value Date   WBC 4.0 03/15/2023   HGB 11.4 (L) 03/15/2023   HCT 35.4 (L) 03/15/2023   PLT 222 03/15/2023   GLUCOSE 98 03/15/2023   CHOL 145 10/29/2022   TRIG 69.0 10/29/2022   HDL 44.80 10/29/2022   LDLDIRECT 156.9 10/10/2010   LDLCALC 86 10/29/2022   ALT 9 10/29/2022   AST 15 10/29/2022   NA 140 03/15/2023   K 4.4 03/15/2023   CL 106 03/15/2023   CREATININE 2.39 (H) 03/15/2023   BUN 29 (H) 03/15/2023   CO2 26 03/15/2023   TSH 2.14 10/29/2022   PSA 1.09 10/29/2022   HGBA1C 5.6 03/18/2023   MICROALBUR 25.7 (H) 10/29/2022   Hemoglobin A1C 4.0 - 5.6 % 5.6 6.0    Assessment/Plan:  TOBEY CAPELLA is a 77 y.o. Black or African American [2] male with  has a past medical history of ALLERGIC RHINITIS (04/09/2007), Allergy, ASTHMA (12/04/2007), BACK PAIN (09/16/2009), BENIGN PROSTATIC HYPERTROPHY (04/09/2007), CEREBROVASCULAR ACCIDENT, HX OF (07/17/2010), COLONIC POLYPS, HX OF (12/04/2007), CONSTIPATION (09/25/2010), Degeneration of cervical intervertebral disc (03/28/2007), DEPRESSION (12/04/2007), DIVERTICULOSIS, COLON (12/04/2007), DYSPHAGIA UNSPECIFIED (03/16/2008), ERECTILE DYSFUNCTION (04/09/2007), ESOPHAGEAL STRICTURE (05/26/2008), GERD (12/04/2007), HEMORRHOIDS, RECURRENT (02/11/2008), HYPERLIPIDEMIA (12/04/2007), HYPERTENSION (03/28/2007), LEG PAIN, BILATERAL (01/25/2010), LOW BACK PAIN (04/09/2007), LUNG NODULE (12/04/2007), Osteoarth NOS-Unspec (03/28/2007), Other dysphagia (11/21/2009), Peripheral neuropathy (05/29/2018), POLYARTHRALGIA (07/13/2008), Polymyalgia rheumatica (HCC) (07/26/2008), PVD WITH CLAUDICATION (02/10/2010), RENAL INSUFFICIENCY (03/15/2010), Sleep apnea, SLEEP APNEA, OBSTRUCTIVE, MODERATE (04/09/2007), Stroke (HCC), and TB SKIN TEST, POSITIVE (03/28/2007).  Asthma Stable overall, cont inhaler prn  Vitamin D  deficiency Last vitamin D Lab Results  Component Value Date   VD25OH 30.68 10/29/2022   Low, to start oral replacement   B12 deficiency Lab Results  Component Value Date   VITAMINB12 481 10/29/2022   Stable, cont oral replacement - b12 1000 mcg qd   Dysphagia With worsening, and now recent wt loss, for refer GI may need EGD  Impaired glucose tolerance Lab Results  Component Value Date   HGBA1C 5.6 03/18/2023   Stable, pt to continue current medical treatment  - diet, wt loss  Chest pain I suspect left sided neuritic pain, to consider trial gabapentin  Followup: Return in about 6 months (around 09/18/2023).  Oliver Barre, MD 03/19/2023 7:44 PM [facility]

## 2023-03-19 ENCOUNTER — Encounter: Payer: Self-pay | Admitting: Internal Medicine

## 2023-03-19 DIAGNOSIS — R1310 Dysphagia, unspecified: Secondary | ICD-10-CM | POA: Insufficient documentation

## 2023-03-19 NOTE — Assessment & Plan Note (Signed)
Lab Results  Component Value Date   [id number] 10/29/2022   Stable, cont oral replacement - b12 1000 mcg qd

## 2023-03-19 NOTE — Assessment & Plan Note (Signed)
I suspect left sided neuritic pain, to consider trial gabapentin

## 2023-03-19 NOTE — Assessment & Plan Note (Signed)
With worsening, and now recent wt loss, for refer GI may need EGD

## 2023-03-19 NOTE — Addendum Note (Signed)
Addended by: Corwin Levins on: 03/19/2023 07:46 PM   Modules accepted: Orders

## 2023-03-19 NOTE — Assessment & Plan Note (Signed)
Stable overall, cont inhaler prn 

## 2023-03-19 NOTE — Assessment & Plan Note (Signed)
Lab Results  Component Value Date   HGBA1C 5.6 03/18/2023   Stable, pt to continue current medical treatment  - diet, wt loss

## 2023-03-19 NOTE — Assessment & Plan Note (Signed)
Last vitamin D Lab Results  Component Value Date   VD25OH 30.68 10/29/2022   Low, to start oral replacement

## 2023-03-22 ENCOUNTER — Telehealth: Payer: Self-pay

## 2023-03-22 NOTE — Telephone Encounter (Signed)
Transition Care Management Unsuccessful Follow-up Telephone Call  Date of discharge and from where:  [facility] 8/2  Attempts:  2nd Attempt  Reason for unsuccessful TCM follow-up call:  No answer/busy   Lenard Forth [facility] Guide, [facility] [phone] [address] [place], [place], [place] [address] Phone: [phone] Email: [email or web address]

## 2023-03-22 NOTE — Telephone Encounter (Signed)
Transition Care Management Unsuccessful Follow-up Telephone Call  Date of discharge and from where:  [facility] 8/2  Attempts:  1st  Reason for unsuccessful TCM follow-up call:  No answer/busy      Lenard Forth [facility] Guide, [facility] [phone] [address] [place], [place], [place] [address] Phone: [phone] Email: [email or web address]

## 2023-03-27 ENCOUNTER — Telehealth: Payer: Self-pay | Admitting: Internal Medicine

## 2023-03-27 MED ORDER — DICYCLOMINE HCL 10 MG PO CAPS: 10.0000 mg | ORAL_CAPSULE | Freq: Three times a day (TID) | ORAL | 1 refills | Status: DC

## 2023-03-27 NOTE — Telephone Encounter (Signed)
Pt mentioned he talked with Dr Jonny Ruiz during his appt about "gas medication" and if he needs it and that if his situation does not get better, he should reach out to [place] to let either Dr Jonny Ruiz or his Nurse know that he is requesting that medication that was discussed during his visit.

## 2023-03-27 NOTE — Telephone Encounter (Signed)
Not sure what to say, since there really is no "gas medication" that works for gas per se  But we can try dicyclomine as needed for similar symptoms  - I have done rx

## 2023-04-04 ENCOUNTER — Other Ambulatory Visit: Payer: Self-pay | Admitting: Internal Medicine

## 2023-05-20 ENCOUNTER — Other Ambulatory Visit: Payer: Self-pay | Admitting: Internal Medicine

## 2023-05-20 ENCOUNTER — Other Ambulatory Visit: Payer: Self-pay

## 2023-06-19 ENCOUNTER — Ambulatory Visit (INDEPENDENT_AMBULATORY_CARE_PROVIDER_SITE_OTHER): Payer: Medicare PPO | Admitting: Family Medicine

## 2023-06-19 ENCOUNTER — Encounter: Payer: Self-pay | Admitting: Family Medicine

## 2023-06-19 VITALS — BP 124/54 | HR 52 | Temp 97.8°F | Wt 250.0 lb

## 2023-06-19 DIAGNOSIS — J309 Allergic rhinitis, unspecified: Secondary | ICD-10-CM

## 2023-06-19 DIAGNOSIS — I10 Essential (primary) hypertension: Secondary | ICD-10-CM

## 2023-06-19 DIAGNOSIS — R519 Headache, unspecified: Secondary | ICD-10-CM | POA: Diagnosis not present

## 2023-06-19 NOTE — Progress Notes (Signed)
   Subjective:    Patient ID: Troy Palmer, male    DOB: Sep 30, 1945, 77 y.o.   MRN: [id number]  HPI Here to check his BP because he has had a mild dull frontal headache the last 4 days. He has some allergy issues every fall, and he has had some PND this week. No fever or ST or cough. He does not have a BP cuff at home.    Review of Systems  Constitutional: Negative.   Respiratory: Negative.    Cardiovascular: Negative.   Neurological:  Positive for headaches. Negative for dizziness.       Objective:   Physical Exam Constitutional:      Appearance: Normal appearance.  Cardiovascular:     Rate and Rhythm: Normal rate and regular rhythm.     Pulses: Normal pulses.     Heart sounds: Normal heart sounds.  Pulmonary:     Effort: Pulmonary effort is normal.     Breath sounds: Normal breath sounds.  Neurological:     General: No focal deficit present.     Mental Status: He is alert and oriented to person, place, and time.           Assessment & Plan:  His HTN is well controlled. I advised him to get a BP cuff so he can follow this at home. His headache is likely due to sinus congestion, so I suggested her try Mucinex as needed.  Gershon Crane, MD

## 2023-06-27 DIAGNOSIS — M109 Gout, unspecified: Secondary | ICD-10-CM | POA: Diagnosis not present

## 2023-06-27 DIAGNOSIS — N183 Chronic kidney disease, stage 3 unspecified: Secondary | ICD-10-CM | POA: Diagnosis not present

## 2023-06-27 DIAGNOSIS — E66811 Obesity, class 1: Secondary | ICD-10-CM | POA: Diagnosis not present

## 2023-06-27 DIAGNOSIS — I129 Hypertensive chronic kidney disease with stage 1 through stage 4 chronic kidney disease, or unspecified chronic kidney disease: Secondary | ICD-10-CM | POA: Diagnosis not present

## 2023-06-28 LAB — LAB REPORT - SCANNED: EGFR: 31

## 2023-07-23 ENCOUNTER — Ambulatory Visit: Payer: Medicare PPO | Admitting: Internal Medicine

## 2023-07-23 ENCOUNTER — Encounter: Payer: Self-pay | Admitting: Internal Medicine

## 2023-07-23 VITALS — BP 134/82 | HR 50 | Temp 97.8°F | Ht 69.0 in | Wt 246.0 lb

## 2023-07-23 DIAGNOSIS — J4520 Mild intermittent asthma, uncomplicated: Secondary | ICD-10-CM

## 2023-07-23 DIAGNOSIS — M353 Polymyalgia rheumatica: Secondary | ICD-10-CM

## 2023-07-23 DIAGNOSIS — I10 Essential (primary) hypertension: Secondary | ICD-10-CM

## 2023-07-23 DIAGNOSIS — N184 Chronic kidney disease, stage 4 (severe): Secondary | ICD-10-CM

## 2023-07-23 DIAGNOSIS — Z23 Encounter for immunization: Secondary | ICD-10-CM

## 2023-07-23 DIAGNOSIS — E538 Deficiency of other specified B group vitamins: Secondary | ICD-10-CM

## 2023-07-23 DIAGNOSIS — M2540 Effusion, unspecified joint: Secondary | ICD-10-CM | POA: Insufficient documentation

## 2023-07-23 DIAGNOSIS — E559 Vitamin D deficiency, unspecified: Secondary | ICD-10-CM | POA: Diagnosis not present

## 2023-07-23 DIAGNOSIS — J309 Allergic rhinitis, unspecified: Secondary | ICD-10-CM | POA: Diagnosis not present

## 2023-07-23 DIAGNOSIS — R768 Other specified abnormal immunological findings in serum: Secondary | ICD-10-CM | POA: Insufficient documentation

## 2023-07-23 NOTE — Assessment & Plan Note (Signed)
Last vitamin D Lab Results  Component Value Date   VD25OH 30.68 10/29/2022   Low, to start oral replacement

## 2023-07-23 NOTE — Assessment & Plan Note (Signed)
Lab Results  Component Value Date   CREATININE 2.39 (H) 03/15/2023   Stable overall, cont to avoid nephrotoxins

## 2023-07-23 NOTE — Progress Notes (Signed)
Patient ID: Troy Palmer, male   DOB: 06/27/1946, 77 y.o.   MRN: [id number]        Chief Complaint: follow up PMR, low vit d, low b12, asthma       HPI:  Troy Palmer is a 77 y.o. male here with c/o with mild worsening bilateral shoulder pain without overuse or trauma similar to prior PMR, worse to use the shoulder, nothing makes better.  Pt denies chest pain, increased sob or doe, wheezing, orthopnea, PND, increased LE swelling, palpitations, dizziness or syncope.   Pt denies polydipsia, polyuria, or new focal neuro s/s.    Pt denies fever, wt loss, night sweats, loss of appetite, or other constitutional symptoms  Due for flu shot  Does have several wks ongoing nasal allergy symptoms with clearish congestion, itch and sneezing, without fever, pain, ST, cough, swelling or wheezing.'Followed per renal every 4-6 mo. Just had renal labs there 2 wks ago, stable per pt Wt Readings from Last 3 Encounters:  07/23/23 246 lb (111.6 kg)  06/19/23 250 lb (113.4 kg)  03/18/23 252 lb 12.8 oz (114.7 kg)   BP Readings from Last 3 Encounters:  07/23/23 134/82  06/19/23 (!) 124/54  03/18/23 118/72         Past Medical History:  Diagnosis Date   ALLERGIC RHINITIS 04/09/2007   Allergy    ASTHMA 12/04/2007   BACK PAIN 09/16/2009   BENIGN PROSTATIC HYPERTROPHY 04/09/2007   CEREBROVASCULAR ACCIDENT, HX OF 07/17/2010   COLONIC POLYPS, HX OF 12/04/2007   CONSTIPATION 09/25/2010   Degeneration of cervical intervertebral disc 03/28/2007   DEPRESSION 12/04/2007   DIVERTICULOSIS, COLON 12/04/2007   DYSPHAGIA UNSPECIFIED 03/16/2008   ERECTILE DYSFUNCTION 04/09/2007   ESOPHAGEAL STRICTURE 05/26/2008   GERD 12/04/2007   HEMORRHOIDS, RECURRENT 02/11/2008   HYPERLIPIDEMIA 12/04/2007   HYPERTENSION 03/28/2007   LEG PAIN, BILATERAL 01/25/2010   LOW BACK PAIN 04/09/2007   LUNG NODULE 12/04/2007   Osteoarth NOS-Unspec 03/28/2007   Other dysphagia 11/21/2009   Peripheral neuropathy 05/29/2018   POLYARTHRALGIA 07/13/2008    Polymyalgia rheumatica (HCC) 07/26/2008   PVD WITH CLAUDICATION 02/10/2010   RENAL INSUFFICIENCY 03/15/2010   Sleep apnea    no cpap    SLEEP APNEA, OBSTRUCTIVE, MODERATE 04/09/2007   Stroke (HCC)    TIA    TB SKIN TEST, POSITIVE 03/28/2007   Past Surgical History:  Procedure Laterality Date   COLONOSCOPY     ROTATOR CUFF REPAIR Bilateral    TOTAL KNEE ARTHROPLASTY     x 2    reports that he has quit smoking. He has never used smokeless tobacco. He reports that he does not drink alcohol and does not use drugs. family history includes Asthma in an other family member; Healthy in his mother; Heart disease in his father; Hypertension in his brother; Lupus in his brother. Allergies  Allergen Reactions   Ace Inhibitors Swelling    Angioedema throat   Amoxicillin Other (See Comments)   Augmentin [Amoxicillin-Pot Clavulanate] Other (See Comments)    Marked weakness with po med x 3 doses   Doxycycline     Some GI upset   Prednisone Other (See Comments)   Sulfa Antibiotics Hives    And facial angioedema   Current Outpatient Medications on File Prior to Visit  Medication Sig Dispense Refill   acetaminophen (TYLENOL) 650 MG CR tablet Take 1 tablet (650 mg total) by mouth every 6 (six) hours as needed (back pain). 30 tablet 0   allopurinol (ZYLOPRIM)  100 MG tablet Take 1 tablet (100 mg total) by mouth daily. 90 tablet 2   amLODipine (NORVASC) 5 MG tablet TAKE 1 TABLET(5 MG) BY MOUTH DAILY 90 tablet 1   atorvastatin (LIPITOR) 80 MG tablet TAKE 1 TABLET(80 MG) BY MOUTH DAILY 90 tablet 3   cetirizine HCl (ZYRTEC) 1 MG/ML solution Take 5 mLs (5 mg total) by mouth at bedtime. 150 mL 1   Cholecalciferol 50 MCG (2000 UT) TABS 1 tab by mouth once daily 30 tablet 99   dicyclomine (BENTYL) 10 MG capsule TAKE 1 CAPSULE(10 MG) BY MOUTH FOUR TIMES DAILY BEFORE MEALS AND AT BEDTIME 90 capsule 1   furosemide (LASIX) 40 MG tablet TAKE 1 AND 1/2 TABLETS(60 MG) BY MOUTH TWICE DAILY 270 tablet 2   ipratropium  (ATROVENT) 0.06 % nasal spray Place 2 sprays into both nostrils 3 (three) times daily. As needed for nasal congestion, runny nose 15 mL 1   methocarbamol (ROBAXIN) 500 MG tablet Take 2 tablets (1,000 mg total) by mouth at bedtime as needed for muscle spasms. 20 tablet 0   metoprolol succinate (TOPROL-XL) 50 MG 24 hr tablet Take 1 tablet (50 mg total) by mouth daily. Take with or immediately following a meal. 90 tablet 2   pantoprazole (PROTONIX) 40 MG tablet TAKE 1 TABLET(40 MG) BY MOUTH DAILY 90 tablet 3   PFIZER-BIONTECH COVID-19 VACC 30 MCG/0.3ML injection      promethazine-dextromethorphan (PROMETHAZINE-DM) 6.25-15 MG/5ML syrup Take 2.5 mLs by mouth 4 (four) times daily as needed for cough. 60 mL 0   tamsulosin (FLOMAX) 0.4 MG CAPS capsule TAKE 1 CAPSULE(0.4 MG) BY MOUTH TWICE DAILY 180 capsule 2   triamcinolone (NASACORT) 55 MCG/ACT AERO nasal inhaler Place 2 sprays into the nose daily. 1 each 12   vitamin B-12 (CYANOCOBALAMIN) 1000 MCG tablet Take 1 tablet (1,000 mcg total) by mouth daily. 90 tablet 1   No current facility-administered medications on file prior to visit.        ROS:  All others reviewed and negative.  Objective        PE:  BP 134/82 (BP Location: Right Arm, Patient Position: Sitting, Cuff Size: Normal)   Pulse (!) 50   Temp 97.8 F (36.6 C) (Oral)   Ht 5\' 9"  (1.753 m)   Wt 246 lb (111.6 kg)   SpO2 95%   BMI 36.33 kg/m                 Constitutional: Pt appears in NAD               HENT: Head: NCAT.                Right Ear: External ear normal.                 Left Ear: External ear normal.                Eyes: . Pupils are equal, round, and reactive to light. Conjunctivae and EOM are normal               Nose: without d/c or deformity               Neck: Neck supple. Gross normal ROM               Cardiovascular: Normal rate and regular rhythm.                 Pulmonary/Chest: Effort normal and breath sounds without rales or  wheezing.                Abd:   Soft, NT, ND, + BS, no organomegaly               Neurological: Pt is alert. At baseline orientation, motor grossly intact               Skin: Skin is warm. No rashes, no other new lesions, LE edema - none               Psychiatric: Pt behavior is normal without agitation   Micro: none  Cardiac tracings I have personally interpreted today:  none  Pertinent Radiological findings (summarize): none   Lab Results  Component Value Date   WBC 4.0 03/15/2023   HGB 11.4 (L) 03/15/2023   HCT 35.4 (L) 03/15/2023   PLT 222 03/15/2023   GLUCOSE 98 03/15/2023   CHOL 145 10/29/2022   TRIG 69.0 10/29/2022   HDL 44.80 10/29/2022   LDLDIRECT 156.9 10/10/2010   LDLCALC 86 10/29/2022   ALT 9 10/29/2022   AST 15 10/29/2022   NA 140 03/15/2023   K 4.4 03/15/2023   CL 106 03/15/2023   CREATININE 2.39 (H) 03/15/2023   BUN 29 (H) 03/15/2023   CO2 26 03/15/2023   TSH 2.14 10/29/2022   PSA 1.09 10/29/2022   HGBA1C 5.6 03/18/2023   MICROALBUR 25.7 (H) 10/29/2022   Assessment/Plan:  OZIAS HERMANNS is a 77 y.o. Black or African American [2] male with  has a past medical history of ALLERGIC RHINITIS (04/09/2007), Allergy, ASTHMA (12/04/2007), BACK PAIN (09/16/2009), BENIGN PROSTATIC HYPERTROPHY (04/09/2007), CEREBROVASCULAR ACCIDENT, HX OF (07/17/2010), COLONIC POLYPS, HX OF (12/04/2007), CONSTIPATION (09/25/2010), Degeneration of cervical intervertebral disc (03/28/2007), DEPRESSION (12/04/2007), DIVERTICULOSIS, COLON (12/04/2007), DYSPHAGIA UNSPECIFIED (03/16/2008), ERECTILE DYSFUNCTION (04/09/2007), ESOPHAGEAL STRICTURE (05/26/2008), GERD (12/04/2007), HEMORRHOIDS, RECURRENT (02/11/2008), HYPERLIPIDEMIA (12/04/2007), HYPERTENSION (03/28/2007), LEG PAIN, BILATERAL (01/25/2010), LOW BACK PAIN (04/09/2007), LUNG NODULE (12/04/2007), Osteoarth NOS-Unspec (03/28/2007), Other dysphagia (11/21/2009), Peripheral neuropathy (05/29/2018), POLYARTHRALGIA (07/13/2008), Polymyalgia rheumatica (HCC) (07/26/2008), PVD WITH CLAUDICATION  (02/10/2010), RENAL INSUFFICIENCY (03/15/2010), Sleep apnea, SLEEP APNEA, OBSTRUCTIVE, MODERATE (04/09/2007), Stroke (HCC), and TB SKIN TEST, POSITIVE (03/28/2007).  Polymyalgia rheumatica With mild worsening symptoms, for rheum referral  Vitamin D deficiency Last vitamin D Lab Results  Component Value Date   VD25OH 30.68 10/29/2022   Low, to start oral replacement   B12 deficiency Lab Results  Component Value Date   VITAMINB12 481 10/29/2022   Stable, cont oral replacement - b12 1000 mcg qd   Asthma Stable overall, cont inhaler prn  CKD (chronic kidney disease) stage 4, GFR 15-29 ml/min (HCC) Lab Results  Component Value Date   CREATININE 2.39 (H) 03/15/2023   Stable overall, cont to avoid nephrotoxins   Essential hypertension BP Readings from Last 3 Encounters:  07/23/23 134/82  06/19/23 (!) 124/54  03/18/23 118/72   Stable, pt to continue medical treatment norvasc 5 every day, toprol xl 50 qd   Allergic rhinitis Mild to mod, to restart otc zyrtec 10 every day, and nasacort asd, to f/u any worsening symptoms or concerns  Followup: Return in about 6 months (around 01/21/2024).  Oliver Barre, MD 07/23/2023 7:24 PM [facility]

## 2023-07-23 NOTE — Assessment & Plan Note (Signed)
Mild to mod, to restart otc zyrtec 10 every day, and nasacort asd, to f/u any worsening symptoms or concerns

## 2023-07-23 NOTE — Assessment & Plan Note (Signed)
Lab Results  Component Value Date   [id number] 10/29/2022   Stable, cont oral replacement - b12 1000 mcg qd

## 2023-07-23 NOTE — Assessment & Plan Note (Signed)
With mild worsening symptoms, for rheum referral

## 2023-07-23 NOTE — Patient Instructions (Signed)
You had the flu shot today  Please continue all other medications as before, and refills have been done if requested.  Please have the pharmacy call with any other refills you may need.  Please continue your efforts at being more active, low cholesterol diet, and weight control.  Please keep your appointments with your specialists as you may have planned - kidney doctor as planned  You will be contacted regarding the referral for: rheumatology  We can hold on lab testing today  Please make an Appointment to return in 6 months, or sooner if needed

## 2023-07-23 NOTE — Assessment & Plan Note (Signed)
Stable overall, cont inhaler prn 

## 2023-07-23 NOTE — Assessment & Plan Note (Signed)
BP Readings from Last 3 Encounters:  07/23/23 134/82  06/19/23 (!) 124/54  03/18/23 118/72   Stable, pt to continue medical treatment norvasc 5 every day, toprol xl 50 qd

## 2023-08-19 ENCOUNTER — Other Ambulatory Visit: Payer: Self-pay | Admitting: Internal Medicine

## 2023-08-19 ENCOUNTER — Other Ambulatory Visit: Payer: Self-pay

## 2023-09-16 DIAGNOSIS — H52223 Regular astigmatism, bilateral: Secondary | ICD-10-CM | POA: Diagnosis not present

## 2023-09-16 DIAGNOSIS — H02881 Meibomian gland dysfunction right upper eyelid: Secondary | ICD-10-CM | POA: Diagnosis not present

## 2023-09-16 DIAGNOSIS — H02884 Meibomian gland dysfunction left upper eyelid: Secondary | ICD-10-CM | POA: Diagnosis not present

## 2023-09-18 ENCOUNTER — Other Ambulatory Visit: Payer: Self-pay

## 2023-09-18 ENCOUNTER — Other Ambulatory Visit: Payer: Self-pay | Admitting: Internal Medicine

## 2023-10-21 DIAGNOSIS — I129 Hypertensive chronic kidney disease with stage 1 through stage 4 chronic kidney disease, or unspecified chronic kidney disease: Secondary | ICD-10-CM | POA: Diagnosis not present

## 2023-10-21 DIAGNOSIS — M109 Gout, unspecified: Secondary | ICD-10-CM | POA: Diagnosis not present

## 2023-10-21 DIAGNOSIS — N183 Chronic kidney disease, stage 3 unspecified: Secondary | ICD-10-CM | POA: Diagnosis not present

## 2023-10-25 ENCOUNTER — Ambulatory Visit (INDEPENDENT_AMBULATORY_CARE_PROVIDER_SITE_OTHER): Admitting: Internal Medicine

## 2023-10-25 ENCOUNTER — Encounter: Payer: Self-pay | Admitting: Internal Medicine

## 2023-10-25 VITALS — BP 122/66 | HR 50 | Temp 97.8°F | Ht 69.0 in | Wt 242.0 lb

## 2023-10-25 DIAGNOSIS — M353 Polymyalgia rheumatica: Secondary | ICD-10-CM

## 2023-10-25 DIAGNOSIS — I10 Essential (primary) hypertension: Secondary | ICD-10-CM | POA: Diagnosis not present

## 2023-10-25 DIAGNOSIS — Z0001 Encounter for general adult medical examination with abnormal findings: Secondary | ICD-10-CM

## 2023-10-25 DIAGNOSIS — N184 Chronic kidney disease, stage 4 (severe): Secondary | ICD-10-CM

## 2023-10-25 DIAGNOSIS — K5900 Constipation, unspecified: Secondary | ICD-10-CM

## 2023-10-25 MED ORDER — TRAMADOL HCL 50 MG PO TABS: 50.0000 mg | ORAL_TABLET | Freq: Four times a day (QID) | ORAL | 2 refills | Status: AC | PRN

## 2023-10-25 NOTE — Progress Notes (Signed)
 Patient ID: Troy Palmer, male   DOB: 1946/03/28, 78 y.o.   MRN: [id number]         Chief Complaint:: wellness exam and PMR pain, constipation, ckd3b, htn       HPI:  Troy Palmer is a 78 y.o. male here for wellness exam; for tdap and shignrix at pharmacy, o/w up to date                        Also saw renal mon mar 10, with labs stable, does not want further lab today.  Pt denies chest pain, increased sob or doe, wheezing, orthopnea, PND, increased LE swelling, palpitations, dizziness or syncope.   Pt denies polydipsia, polyuria, or new focal neuro s/s.    Pt denies fever, wt loss, night sweats, loss of appetite, or other constitutional symptoms  Biggest concern today is bilateral shoulder pain and overall stiffness related to worsening PMR it seems but is adamant he does not want steroid tx as he believes it makes the constipation worse.    Wt Readings from Last 3 Encounters:  10/25/23 242 lb (109.8 kg)  07/23/23 246 lb (111.6 kg)  06/19/23 250 lb (113.4 kg)   BP Readings from Last 3 Encounters:  10/25/23 122/66  07/23/23 134/82  06/19/23 (!) 124/54   Immunization History  Administered Date(s) Administered   Fluad Quad(high Dose 65+) 06/04/2019, 05/24/2020, 06/09/2021   Fluad Trivalent(High Dose 65+) 07/23/2023   Influenza Whole 05/26/2008, 05/21/2012, 05/11/2013   Influenza, High Dose Seasonal PF 05/05/2018   Influenza-Unspecified 05/29/2014, 05/28/2022   PFIZER(Purple Top)SARS-COV-2 Vaccination 01/14/2020, 02/04/2020, 08/16/2020   Pfizer(Comirnaty)Fall Seasonal Vaccine 12 years and older 05/25/2022   Pneumococcal Conjugate-13 10/15/2013   Pneumococcal Polysaccharide-23 10/15/2011   Tetanus 10/14/2012   Health Maintenance Due  Topic Date Due   Zoster Vaccines- Shingrix (1 of 2) Never done   DTaP/Tdap/Td (1 - Tdap) 10/15/2012      Past Medical History:  Diagnosis Date   ALLERGIC RHINITIS 04/09/2007   Allergy    ASTHMA 12/04/2007   BACK PAIN 09/16/2009   BENIGN  PROSTATIC HYPERTROPHY 04/09/2007   CEREBROVASCULAR ACCIDENT, HX OF 07/17/2010   COLONIC POLYPS, HX OF 12/04/2007   CONSTIPATION 09/25/2010   Degeneration of cervical intervertebral disc 03/28/2007   DEPRESSION 12/04/2007   DIVERTICULOSIS, COLON 12/04/2007   DYSPHAGIA UNSPECIFIED 03/16/2008   ERECTILE DYSFUNCTION 04/09/2007   ESOPHAGEAL STRICTURE 05/26/2008   GERD 12/04/2007   HEMORRHOIDS, RECURRENT 02/11/2008   HYPERLIPIDEMIA 12/04/2007   HYPERTENSION 03/28/2007   LEG PAIN, BILATERAL 01/25/2010   LOW BACK PAIN 04/09/2007   LUNG NODULE 12/04/2007   Osteoarth NOS-Unspec 03/28/2007   Other dysphagia 11/21/2009   Peripheral neuropathy 05/29/2018   POLYARTHRALGIA 07/13/2008   Polymyalgia rheumatica (HCC) 07/26/2008   PVD WITH CLAUDICATION 02/10/2010   RENAL INSUFFICIENCY 03/15/2010   Sleep apnea    no cpap    SLEEP APNEA, OBSTRUCTIVE, MODERATE 04/09/2007   Stroke (HCC)    TIA    TB SKIN TEST, POSITIVE 03/28/2007   Past Surgical History:  Procedure Laterality Date   COLONOSCOPY     ROTATOR CUFF REPAIR Bilateral    TOTAL KNEE ARTHROPLASTY     x 2    reports that he has quit smoking. He has never used smokeless tobacco. He reports that he does not drink alcohol and does not use drugs. family history includes Asthma in an other family member; Healthy in his mother; Heart disease in his father; Hypertension in his  brother; Lupus in his brother. Allergies  Allergen Reactions   Ace Inhibitors Swelling    Angioedema throat   Amoxicillin Other (See Comments)   Augmentin [Amoxicillin-Pot Clavulanate] Other (See Comments)    Marked weakness with po med x 3 doses   Doxycycline     Some GI upset   Prednisone Other (See Comments)   Sulfa Antibiotics Hives    And facial angioedema   Current Outpatient Medications on File Prior to Visit  Medication Sig Dispense Refill   acetaminophen (TYLENOL) 650 MG CR tablet Take 1 tablet (650 mg total) by mouth every 6 (six) hours as needed (back pain). 30 tablet 0    allopurinol (ZYLOPRIM) 100 MG tablet TAKE 1 TABLET(100 MG) BY MOUTH DAILY 90 tablet 2   amLODipine (NORVASC) 5 MG tablet TAKE 1 TABLET(5 MG) BY MOUTH DAILY 90 tablet 1   atorvastatin (LIPITOR) 80 MG tablet TAKE 1 TABLET(80 MG) BY MOUTH DAILY 90 tablet 3   augmented betamethasone dipropionate (DIPROLENE-AF) 0.05 % cream Apply topically.     cetirizine HCl (ZYRTEC) 1 MG/ML solution Take 5 mLs (5 mg total) by mouth at bedtime. 150 mL 1   Cholecalciferol 50 MCG (2000 UT) TABS 1 tab by mouth once daily 30 tablet 99   dicyclomine (BENTYL) 10 MG capsule TAKE 1 CAPSULE(10 MG) BY MOUTH FOUR TIMES DAILY BEFORE MEALS AND AT BEDTIME 90 capsule 1   furosemide (LASIX) 40 MG tablet TAKE 1 AND 1/2 TABLETS(60 MG) BY MOUTH TWICE DAILY 270 tablet 2   ipratropium (ATROVENT) 0.06 % nasal spray Place 2 sprays into both nostrils 3 (three) times daily. As needed for nasal congestion, runny nose 15 mL 1   methocarbamol (ROBAXIN) 500 MG tablet Take 2 tablets (1,000 mg total) by mouth at bedtime as needed for muscle spasms. 20 tablet 0   metoprolol succinate (TOPROL-XL) 50 MG 24 hr tablet TAKE 1 TABLET(50 MG) BY MOUTH DAILY WITH OR IMMEDIATELY FOLLOWING A MEAL 90 tablet 2   pantoprazole (PROTONIX) 40 MG tablet TAKE 1 TABLET(40 MG) BY MOUTH DAILY 90 tablet 3   PFIZER-BIONTECH COVID-19 VACC 30 MCG/0.3ML injection      tamsulosin (FLOMAX) 0.4 MG CAPS capsule TAKE 1 CAPSULE(0.4 MG) BY MOUTH TWICE DAILY 180 capsule 2   triamcinolone (NASACORT) 55 MCG/ACT AERO nasal inhaler Place 2 sprays into the nose daily. 1 each 12   vitamin B-12 (CYANOCOBALAMIN) 1000 MCG tablet Take 1 tablet (1,000 mcg total) by mouth daily. 90 tablet 1   No current facility-administered medications on file prior to visit.        ROS:  All others reviewed and negative.  Objective        PE:  BP 122/66 (BP Location: Right Arm, Patient Position: Sitting, Cuff Size: Normal)   Pulse (!) 50   Temp 97.8 F (36.6 C) (Oral)   Ht 5\' 9"  (1.753 m)   Wt 242 lb  (109.8 kg)   SpO2 99%   BMI 35.74 kg/m                 Constitutional: Pt appears in NAD               HENT: Head: NCAT.                Right Ear: External ear normal.                 Left Ear: External ear normal.  Eyes: . Pupils are equal, round, and reactive to light. Conjunctivae and EOM are normal               Nose: without d/c or deformity               Neck: Neck supple. Gross normal ROM               Cardiovascular: Normal rate and regular rhythm.                 Pulmonary/Chest: Effort normal and breath sounds without rales or wheezing.                Abd:  Soft, NT, ND, + BS, no organomegaly               Neurological: Pt is alert. At baseline orientation, motor grossly intact               Skin: Skin is warm. No rashes, no other new lesions, LE edema - none               Psychiatric: Pt behavior is normal without agitation   Micro: none  Cardiac tracings I have personally interpreted today:  none  Pertinent Radiological findings (summarize): none   Lab Results  Component Value Date   WBC 4.0 03/15/2023   HGB 11.4 (L) 03/15/2023   HCT 35.4 (L) 03/15/2023   PLT 222 03/15/2023   GLUCOSE 98 03/15/2023   CHOL 145 10/29/2022   TRIG 69.0 10/29/2022   HDL 44.80 10/29/2022   LDLDIRECT 156.9 10/10/2010   LDLCALC 86 10/29/2022   ALT 9 10/29/2022   AST 15 10/29/2022   NA 140 03/15/2023   K 4.4 03/15/2023   CL 106 03/15/2023   CREATININE 2.39 (H) 03/15/2023   BUN 29 (H) 03/15/2023   CO2 26 03/15/2023   TSH 2.14 10/29/2022   PSA 1.09 10/29/2022   HGBA1C 5.6 03/18/2023   MICROALBUR 25.7 (H) 10/29/2022   Assessment/Plan:  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 Black or African American [2] male with  has a past medical history of ALLERGIC RHINITIS (04/09/2007), Allergy, ASTHMA (12/04/2007), BACK PAIN (09/16/2009), BENIGN PROSTATIC HYPERTROPHY (04/09/2007), CEREBROVASCULAR ACCIDENT, HX OF (07/17/2010), COLONIC POLYPS, HX OF (12/04/2007), CONSTIPATION (09/25/2010),  Degeneration of cervical intervertebral disc (03/28/2007), DEPRESSION (12/04/2007), DIVERTICULOSIS, COLON (12/04/2007), DYSPHAGIA UNSPECIFIED (03/16/2008), ERECTILE DYSFUNCTION (04/09/2007), ESOPHAGEAL STRICTURE (05/26/2008), GERD (12/04/2007), HEMORRHOIDS, RECURRENT (02/11/2008), HYPERLIPIDEMIA (12/04/2007), HYPERTENSION (03/28/2007), LEG PAIN, BILATERAL (01/25/2010), LOW BACK PAIN (04/09/2007), LUNG NODULE (12/04/2007), Osteoarth NOS-Unspec (03/28/2007), Other dysphagia (11/21/2009), Peripheral neuropathy (05/29/2018), POLYARTHRALGIA (07/13/2008), Polymyalgia rheumatica (HCC) (07/26/2008), PVD WITH CLAUDICATION (02/10/2010), RENAL INSUFFICIENCY (03/15/2010), Sleep apnea, SLEEP APNEA, OBSTRUCTIVE, MODERATE (04/09/2007), Stroke (HCC), and TB SKIN TEST, POSITIVE (03/28/2007).  Encounter for well adult exam with abnormal findings Age and sex appropriate education and counseling updated with regular exercise and diet Referrals for preventative services - none needed Immunizations addressed - for tdap and shiignrix at pharmacy Smoking counseling  - none needed Evidence for depression or other mood disorder - none significant Most recent labs reviewed. I have personally reviewed and have noted: 1) the patient's medical and social history 2) The patient's current medications and supplements 3) The patient's height, weight, and BMI have been recorded in the chart   Polymyalgia rheumatica Mod to severe today, declines steroid tx, for tramadol prn, refer rheumatolgy,  to f/u any worsening symptoms or concerns  CKD (chronic kidney disease) stage 4, GFR 15-29 ml/min (HCC) Lab Results  Component Value  Date   CREATININE 2.39 (H) 03/15/2023   Stable overall, cont to avoid nephrotoxins   Essential hypertension BP Readings from Last 3 Encounters:  10/25/23 122/66  07/23/23 134/82  06/19/23 (!) 124/54   Stable, pt to continue medical treatment norvasc 5 every day, toprol xl 50 qd   Constipation Pt for miralax  daily  Followup: Return in about 6 months (around 04/26/2024).  Oliver Barre, MD 10/26/2023 1:01 PM [facility]

## 2023-10-25 NOTE — Patient Instructions (Addendum)
 Please take all new medication as prescribed - the pain medication - tramadol  Please call if you change your mind about the prednisone  Ok to take the OTC Miralax every day as needed  Please continue all other medications as before, and refills have been done if requested.  Please have the pharmacy call with any other refills you may need.  Please continue your efforts at being more active, low cholesterol diet, and weight control.  You are otherwise up to date with prevention measures today.  Please keep your appointments with your specialists as you may have planned  We can hold on lab testing today  Please make an Appointment to return in 6 months, or sooner if needed

## 2023-10-26 ENCOUNTER — Encounter: Payer: Self-pay | Admitting: Internal Medicine

## 2023-10-26 NOTE — Assessment & Plan Note (Signed)
 BP Readings from Last 3 Encounters:  10/25/23 122/66  07/23/23 134/82  06/19/23 (!) 124/54   Stable, pt to continue medical treatment norvasc 5 every day, toprol xl 50 qd

## 2023-10-26 NOTE — Assessment & Plan Note (Signed)
 Lab Results  Component Value Date   CREATININE 2.39 (H) 03/15/2023   Stable overall, cont to avoid nephrotoxins

## 2023-10-26 NOTE — Assessment & Plan Note (Signed)
 Age and sex appropriate education and counseling updated with regular exercise and diet Referrals for preventative services - none needed Immunizations addressed - for tdap and shiignrix at pharmacy Smoking counseling  - none needed Evidence for depression or other mood disorder - none significant Most recent labs reviewed. I have personally reviewed and have noted: 1) the patient's medical and social history 2) The patient's current medications and supplements 3) The patient's height, weight, and BMI have been recorded in the chart

## 2023-10-26 NOTE — Assessment & Plan Note (Signed)
 Pt for miralax daily

## 2023-10-26 NOTE — Assessment & Plan Note (Signed)
 Mod to severe today, declines steroid tx, for tramadol prn, refer rheumatolgy,  to f/u any worsening symptoms or concerns

## 2023-10-31 ENCOUNTER — Telehealth: Payer: Self-pay

## 2023-10-31 ENCOUNTER — Other Ambulatory Visit (HOSPITAL_COMMUNITY): Payer: Self-pay

## 2023-10-31 NOTE — Telephone Encounter (Signed)
 Pharmacy Patient Advocate Encounter   Received notification from Onbase that prior authorization for Tramadol 50 MG tab is required/requested.   Insurance verification completed.   The patient is insured through [place] .   Per test claim: The current 15 day co-pay is, $2.40.  No PA needed at this time. This test claim was processed through [facility]- copay amounts may vary at other pharmacies due to pharmacy/plan contracts, or as the patient moves through the different stages of their insurance plan.

## 2023-11-21 ENCOUNTER — Other Ambulatory Visit: Payer: Self-pay | Admitting: Internal Medicine

## 2023-11-22 ENCOUNTER — Other Ambulatory Visit: Payer: Self-pay

## 2023-11-25 ENCOUNTER — Encounter: Payer: Self-pay | Admitting: Internal Medicine

## 2023-11-26 ENCOUNTER — Ambulatory Visit

## 2023-11-26 VITALS — Ht 69.0 in | Wt 242.0 lb

## 2023-11-26 DIAGNOSIS — Z0000 Encounter for general adult medical examination without abnormal findings: Secondary | ICD-10-CM | POA: Diagnosis not present

## 2023-11-26 NOTE — Patient Instructions (Addendum)
 Mr. Troy Palmer , Thank you for taking time to come for your Medicare Wellness Visit. I appreciate your ongoing commitment to your health goals. Please review the following plan we discussed and let me know if I can assist you in the future.   Referrals/Orders/Follow-Ups/Clinician Recommendations: It was nice talking with you today.  Dr. Autry Legions sent a referral to [facility], [address], [place]. Keep up the good work. .                                                                  [phone]   This is a list of the screening recommended for you and due dates:  Health Maintenance  Topic Date Due   Zoster (Shingles) Vaccine (1 of 2) Never done   DTaP/Tdap/Td vaccine (1 - Tdap) 10/15/2012   Flu Shot  03/13/2024   Medicare Annual Wellness Visit  11/25/2024   Pneumonia Vaccine  Completed   Hepatitis C Screening  Completed   HPV Vaccine  Aged Out   Meningitis B Vaccine  Aged Out   Colon Cancer Screening  Discontinued   COVID-19 Vaccine  Discontinued    Advanced directives: (Declined) Advance directive discussed with you today. Even though you declined this today, please call our office should you change your mind, and we can give you the proper paperwork for you to fill out.  Next Medicare Annual Wellness Visit scheduled for next year: Yes

## 2023-11-26 NOTE — Progress Notes (Signed)
 Subjective:   Troy Palmer is a 78 y.o. who presents for a Medicare Wellness preventive visit.  Visit Complete: Virtual I connected with  Troy Palmer on 11/26/23 by a audio enabled telemedicine application and verified that I am speaking with the correct person using two identifiers.  Patient Location: Home  Provider Location: Home Office  I discussed the limitations of evaluation and management by telemedicine. The patient expressed understanding and agreed to proceed.  Vital Signs: Because this visit was a virtual/telehealth visit, some criteria may be missing or patient reported. Any vitals not documented were not able to be obtained and vitals that have been documented are patient reported.  VideoDeclined- This patient declined Librarian, academic. Therefore the visit was completed with audio only.  Persons Participating in Visit: Patient.  AWV Questionnaire: No: Patient Medicare AWV questionnaire was not completed prior to this visit.  Cardiac Risk Factors include: advanced age (>69men, >63 women);hypertension;male gender;Other (see comment);dyslipidemia, Risk factor comments: OSA, PVD, CHF, CKD     Objective:    Today's Vitals   11/26/23 1054  Weight: 242 lb (109.8 kg)  Height: 5\' 9"  (1.753 m)   Body mass index is 35.74 kg/m.     11/26/2023   11:10 AM 03/15/2023    4:34 AM 08/31/2022    2:40 PM 03/01/2021    9:58 PM 05/10/2020   11:37 AM 02/26/2019   12:45 PM 02/24/2018    2:03 PM  Advanced Directives  Does Patient Have a Medical Advance Directive? No No No No No No No  Would patient like information on creating a medical advance directive?  No - Patient declined No - Patient declined No - Patient declined No - Patient declined No - Patient declined No - Patient declined    Current Medications (verified) Outpatient Encounter Medications as of 11/26/2023  Medication Sig   acetaminophen (TYLENOL) 650 MG CR tablet Take 1 tablet (650  mg total) by mouth every 6 (six) hours as needed (back pain).   allopurinol (ZYLOPRIM) 100 MG tablet TAKE 1 TABLET(100 MG) BY MOUTH DAILY   amLODipine (NORVASC) 5 MG tablet TAKE 1 TABLET(5 MG) BY MOUTH DAILY   atorvastatin (LIPITOR) 80 MG tablet TAKE 1 TABLET(80 MG) BY MOUTH DAILY   augmented betamethasone dipropionate (DIPROLENE-AF) 0.05 % cream Apply topically.   Cholecalciferol 50 MCG (2000 UT) TABS 1 tab by mouth once daily   tamsulosin (FLOMAX) 0.4 MG CAPS capsule TAKE 1 CAPSULE(0.4 MG) BY MOUTH TWICE DAILY   vitamin B-12 (CYANOCOBALAMIN) 1000 MCG tablet Take 1 tablet (1,000 mcg total) by mouth daily.   cetirizine HCl (ZYRTEC) 1 MG/ML solution Take 5 mLs (5 mg total) by mouth at bedtime. (Patient not taking: Reported on 11/26/2023)   dicyclomine (BENTYL) 10 MG capsule TAKE 1 CAPSULE(10 MG) BY MOUTH FOUR TIMES DAILY BEFORE MEALS AND AT BEDTIME (Patient not taking: Reported on 11/26/2023)   furosemide (LASIX) 40 MG tablet TAKE 1 AND 1/2 TABLETS(60 MG) BY MOUTH TWICE DAILY (Patient not taking: Reported on 11/26/2023)   ipratropium (ATROVENT) 0.06 % nasal spray Place 2 sprays into both nostrils 3 (three) times daily. As needed for nasal congestion, runny nose (Patient not taking: Reported on 11/26/2023)   methocarbamol (ROBAXIN) 500 MG tablet Take 2 tablets (1,000 mg total) by mouth at bedtime as needed for muscle spasms. (Patient not taking: Reported on 11/26/2023)   metoprolol succinate (TOPROL-XL) 50 MG 24 hr tablet TAKE 1 TABLET(50 MG) BY MOUTH DAILY WITH OR IMMEDIATELY FOLLOWING  A MEAL (Patient not taking: Reported on 11/26/2023)   pantoprazole (PROTONIX) 40 MG tablet TAKE 1 TABLET(40 MG) BY MOUTH DAILY (Patient not taking: Reported on 11/26/2023)   PFIZER-BIONTECH COVID-19 VACC 30 MCG/0.3ML injection  (Patient not taking: Reported on 11/26/2023)   traMADol (ULTRAM) 50 MG tablet Take 1 tablet (50 mg total) by mouth every 6 (six) hours as needed. (Patient not taking: Reported on 11/26/2023)    triamcinolone (NASACORT) 55 MCG/ACT AERO nasal inhaler Place 2 sprays into the nose daily. (Patient not taking: Reported on 11/26/2023)   No facility-administered encounter medications on file as of 11/26/2023.    Allergies (verified) Ace inhibitors, Amoxicillin, Augmentin [amoxicillin-pot clavulanate], Doxycycline, Prednisone, and Sulfa antibiotics   History: Past Medical History:  Diagnosis Date   ALLERGIC RHINITIS 04/09/2007   Allergy    ASTHMA 12/04/2007   BACK PAIN 09/16/2009   BENIGN PROSTATIC HYPERTROPHY 04/09/2007   CEREBROVASCULAR ACCIDENT, HX OF 07/17/2010   COLONIC POLYPS, HX OF 12/04/2007   CONSTIPATION 09/25/2010   Degeneration of cervical intervertebral disc 03/28/2007   DEPRESSION 12/04/2007   DIVERTICULOSIS, COLON 12/04/2007   DYSPHAGIA UNSPECIFIED 03/16/2008   ERECTILE DYSFUNCTION 04/09/2007   ESOPHAGEAL STRICTURE 05/26/2008   GERD 12/04/2007   HEMORRHOIDS, RECURRENT 02/11/2008   HYPERLIPIDEMIA 12/04/2007   HYPERTENSION 03/28/2007   LEG PAIN, BILATERAL 01/25/2010   LOW BACK PAIN 04/09/2007   LUNG NODULE 12/04/2007   Osteoarth NOS-Unspec 03/28/2007   Other dysphagia 11/21/2009   Peripheral neuropathy 05/29/2018   POLYARTHRALGIA 07/13/2008   Polymyalgia rheumatica (HCC) 07/26/2008   PVD WITH CLAUDICATION 02/10/2010   RENAL INSUFFICIENCY 03/15/2010   Sleep apnea    no cpap    SLEEP APNEA, OBSTRUCTIVE, MODERATE 04/09/2007   Stroke (HCC)    TIA    TB SKIN TEST, POSITIVE 03/28/2007   Past Surgical History:  Procedure Laterality Date   COLONOSCOPY     ROTATOR CUFF REPAIR Bilateral    TOTAL KNEE ARTHROPLASTY     x 2   Family History  Problem Relation Age of Onset   Healthy Mother    Heart disease Father    Lupus Brother    Hypertension Brother    Asthma Other    Colon cancer Neg Hx    Colon polyps Neg Hx    Esophageal cancer Neg Hx    Rectal cancer Neg Hx    Stomach cancer Neg Hx    Social History   Socioeconomic History   Marital status: Widowed    Spouse name: Not on  file   Number of children: 6   Years of education: Not on file   Highest education level: Not on file  Occupational History   Occupation: RETIRED/light Cabin crew  Tobacco Use   Smoking status: Former   Smokeless tobacco: Never  Advertising account planner   Vaping status: Never Used  Substance and Sexual Activity   Alcohol use: No   Drug use: No   Sexual activity: Not Currently  Other Topics Concern   Not on file  Social History Narrative   Patient does not get regular exercise.   6 children - 1 died with suicide, 1 with homicide      Lives with granddaugter and great grandson/2025      Pt does have steps in the home      Highest level of education 12th grade      Disabled      Left handed   Social Drivers of Health   Financial Resource Strain: Medium Risk (11/26/2023)  Overall Financial Resource Strain (CARDIA)    Difficulty of Paying Living Expenses: Somewhat hard  Food Insecurity: No Food Insecurity (11/26/2023)   Hunger Vital Sign    Worried About Running Out of Food in the Last Year: Never true    Ran Out of Food in the Last Year: Never true  Transportation Needs: No Transportation Needs (11/26/2023)   PRAPARE - Administrator, Civil Service (Medical): No    Lack of Transportation (Non-Medical): No  Physical Activity: Inactive (11/26/2023)   Exercise Vital Sign    Days of Exercise per Week: 0 days    Minutes of Exercise per Session: 0 min  Stress: No Stress Concern Present (11/26/2023)   [employer] of Occupational Health - Occupational Stress Questionnaire    Feeling of Stress : Not at all  Social Connections: Moderately Integrated (11/26/2023)   Social Connection and Isolation Panel [NHANES]    Frequency of Communication with Friends and Family: More than three times a week    Frequency of Social Gatherings with Friends and Family: Once a week    Attends Religious Services: More than 4 times per year    Active Member of [employer] or Organizations: Yes     Attends Banker Meetings: Never    Marital Status: Widowed    Tobacco Counseling Counseling given: Not Answered    Clinical Intake:  Pre-visit preparation completed: Yes  Pain : No/denies pain     BMI - recorded: 35.74 Nutritional Status: BMI > 30  Obese Nutritional Risks: None Diabetes: No  Lab Results  Component Value Date   HGBA1C 5.6 03/18/2023   HGBA1C 6.0 10/29/2022   HGBA1C 6.0 07/10/2022     How often do you need to have someone help you when you read instructions, pamphlets, or other written materials from your doctor or pharmacy?: 1 - Never  Interpreter Needed?: No  Information entered by :: Humbert Morozov, RMA   Activities of Daily Living     11/26/2023   10:54 AM  In your present state of health, do you have any difficulty performing the following activities:  Hearing? 0  Vision? 0  Difficulty concentrating or making decisions? 0  Walking or climbing stairs? 0  Dressing or bathing? 0  Doing errands, shopping? 0  Preparing Food and eating ? N  Using the Toilet? N  In the past six months, have you accidently leaked urine? N  Do you have problems with loss of bowel control? N  Managing your Medications? N  Managing your Finances? N  Housekeeping or managing your Housekeeping? N    Patient Care Team: Corwin Levins, MD as PCP - [facility] as Consulting Physician (Optometry)  Indicate any recent Medical Services you may have received from other than [facility] providers in the past year (date may be approximate).     Assessment:   This is a routine wellness examination for [facility].  Hearing/Vision screen Hearing Screening - Comments:: Has some issues-per pt Vision Screening - Comments:: Wears eyeglasses   Goals Addressed             This Visit's Progress    Client understands the importance of follow-up with providers by attending scheduled visits.   On track      Depression Screen     11/26/2023   11:14 AM  10/25/2023    1:17 PM 07/23/2023    1:21 PM 03/18/2023   10:27 AM 10/29/2022    9:32 AM 08/31/2022  2:41 PM 08/30/2022    3:14 PM  PHQ 2/9 Scores  PHQ - 2 Score 0 0 0 0 0 0 0  PHQ- 9 Score 0    0 0     Fall Risk     11/26/2023   11:10 AM 10/25/2023    1:22 PM 07/23/2023    1:21 PM 03/18/2023   10:27 AM 10/29/2022    9:52 AM  Fall Risk   Falls in the past year? 0 0 0 0 0  Number falls in past yr: 0 0 0 0 0  Injury with Fall? 0 0 0 0 0  Risk for fall due to : No Fall Risks No Fall Risks No Fall Risks No Fall Risks   Follow up Falls prevention discussed;Falls evaluation completed Falls evaluation completed Falls evaluation completed Falls evaluation completed     MEDICARE RISK AT HOME:  Medicare Risk at Home Any stairs in or around the home?: Yes If so, are there any without handrails?: Yes Home free of loose throw rugs in walkways, pet beds, electrical cords, etc?: Yes Adequate lighting in your home to reduce risk of falls?: Yes Life alert?: No Use of a cane, walker or w/c?: No Grab bars in the bathroom?: No Shower chair or bench in shower?: No Elevated toilet seat or a handicapped toilet?: No  TIMED UP AND GO:  Was the test performed?  No  Cognitive Function: 6CIT completed        11/26/2023   11:11 AM 08/31/2022    2:41 PM  6CIT Screen  What Year? 0 points 0 points  What month? 0 points 0 points  What time? 0 points 0 points  Count back from 20 0 points 0 points  Months in reverse 4 points 0 points  Repeat phrase 10 points 0 points  Total Score 14 points 0 points    Immunizations Immunization History  Administered Date(s) Administered   Fluad Quad(high Dose 65+) 06/04/2019, 05/24/2020, 06/09/2021   Fluad Trivalent(High Dose 65+) 07/23/2023   Influenza Whole 05/26/2008, 05/21/2012, 05/11/2013   Influenza, High Dose Seasonal PF 05/05/2018   Influenza-Unspecified 05/29/2014, 05/28/2022   PFIZER(Purple Top)SARS-COV-2 Vaccination 01/14/2020, 02/04/2020, 08/16/2020    Pfizer(Comirnaty)Fall Seasonal Vaccine 12 years and older 05/25/2022   Pneumococcal Conjugate-13 10/15/2013   Pneumococcal Polysaccharide-23 10/15/2011   Tetanus 10/14/2012    Screening Tests Health Maintenance  Topic Date Due   Zoster Vaccines- Shingrix (1 of 2) Never done   DTaP/Tdap/Td (1 - Tdap) 10/15/2012   INFLUENZA VACCINE  03/13/2024   Medicare Annual Wellness (AWV)  11/25/2024   Pneumonia Vaccine 53+ Years old  Completed   Hepatitis C Screening  Completed   HPV VACCINES  Aged Out   Meningococcal B Vaccine  Aged Out   Colonoscopy  Discontinued   COVID-19 Vaccine  Discontinued    Health Maintenance  Health Maintenance Due  Topic Date Due   Zoster Vaccines- Shingrix (1 of 2) Never done   DTaP/Tdap/Td (1 - Tdap) 10/15/2012   Health Maintenance Items Addressed: See Nurse Notes  Additional Screening:  Vision Screening: Recommended annual ophthalmology exams for early detection of glaucoma and other disorders of the eye.  Dental Screening: Recommended annual dental exams for proper oral hygiene  Community Resource Referral / Chronic Care Management: CRR required this visit?  No   CCM required this visit?  No     Plan:     I have personally reviewed and noted the following in the patient's chart:   Medical  and social history Use of alcohol, tobacco or illicit drugs  Current medications and supplements including opioid prescriptions. Patient is not currently taking opioid prescriptions. Functional ability and status Nutritional status Physical activity Advanced directives List of other physicians Hospitalizations, surgeries, and ER visits in previous 12 months Vitals Screenings to include cognitive, depression, and falls Referrals and appointments  In addition, I have reviewed and discussed with patient certain preventive protocols, quality metrics, and best practice recommendations. A written personalized care plan for preventive services as well as  general preventive health recommendations were provided to patient.     Norene Oliveri L Dammon Makarewicz, CMA   11/26/2023   After Visit Summary: (MyChart) Due to this being a telephonic visit, the after visit summary with patients personalized plan was offered to patient via MyChart   Notes: Please refer to Routing Comments.

## 2024-01-22 ENCOUNTER — Ambulatory Visit: Payer: Medicare PPO | Admitting: Internal Medicine

## 2024-01-22 ENCOUNTER — Ambulatory Visit (INDEPENDENT_AMBULATORY_CARE_PROVIDER_SITE_OTHER): Admitting: Gastroenterology

## 2024-01-22 ENCOUNTER — Encounter: Payer: Self-pay | Admitting: Gastroenterology

## 2024-01-22 VITALS — BP 130/60 | HR 60 | Ht 67.5 in | Wt 240.2 lb

## 2024-01-22 DIAGNOSIS — Z1211 Encounter for screening for malignant neoplasm of colon: Secondary | ICD-10-CM

## 2024-01-22 NOTE — Patient Instructions (Signed)
 Your provider has ordered Cologuard testing as an option for colon cancer screening. This is performed by [facility] [facility] [employer] and may be out of network with your insurance. PRIOR to completing the test, it is YOUR responsibility to contact your insurance about covered benefits for this test. Your out of pocket expense could be anywhere from $0.00 to $649.00.   When you call to check coverage with your insurer, please provide the following information:   -The ONLY provider of Cologuard is Optician, dispensing  - CPT code for Cologuard is [phone].  [facility] NPI # [id number]  -[employer] Tax ID # [id number]   We have already sent your demographic and insurance information to [facility] (phone number [phone]) and they should contact you within the next week regarding your test. If you have not heard from them within the next week, please call our office at [phone].   _______________________________________________________  If your blood pressure at your visit was 140/90 or greater, please contact your primary care physician to follow up on this.  _______________________________________________________  If you are age 50 or older, your body mass index should be between 23-30. Your Body mass index is 37.07 kg/m. If this is out of the aforementioned range listed, please consider follow up with your Primary Care Provider.  If you are age 56 or younger, your body mass index should be between 19-25. Your Body mass index is 37.07 kg/m. If this is out of the aformentioned range listed, please consider follow up with your Primary Care Provider.   ________________________________________________________  The [facility] providers would like to encourage you to use MYCHART to communicate with providers for non-urgent requests or questions.  Due to long hold times on the telephone, sending your provider a message by [facility] may be a faster and more  efficient way to get a response.  Please allow 48 business hours for a response.  Please remember that this is for non-urgent requests.  _______________________________________________________   It was a pleasure to see you today!  Thank you for trusting me with your gastrointestinal care!

## 2024-01-22 NOTE — Progress Notes (Signed)
 Chief Complaint: Colon cancer screening Primary GI MD: Para Bold  HPI: Discussed the use of AI scribe software for clinical note transcription with the patient, who gave verbal consent to proceed.  History of Present Illness NAZAR Palmer is a 78 year old male who presents for colon cancer screening.  He last underwent a Cologuard test in 2022, which returned a negative result. Prior to that, he had a colonoscopy in 2012, which showed no polyps.  No gastrointestinal symptoms such as changes in bowel habits, rectal bleeding, weight loss, or abdominal pain. He has no family history of colon cancer.  He is concerned about the preparation required for a colonoscopy, specifically the difficulty in consuming the preparatory solution.   PREVIOUS GI WORKUP   EGD 10/2010 with Dr. Arvie Latus - Stricture at GE junction s/p [facility] dilation - Otherwise normal  Colonoscopy 10/2010 for change in bowel habits - Normal colonoscopy - Repeat 10 years  Echocardiogram 02/2017 showing with EF 60 to 65%  Past Medical History:  Diagnosis Date   ALLERGIC RHINITIS 04/09/2007   Allergy     ASTHMA 12/04/2007   BACK PAIN 09/16/2009   BENIGN PROSTATIC HYPERTROPHY 04/09/2007   CEREBROVASCULAR ACCIDENT, HX OF 07/17/2010   COLONIC POLYPS, HX OF 12/04/2007   CONSTIPATION 09/25/2010   Degeneration of cervical intervertebral disc 03/28/2007   DEPRESSION 12/04/2007   DIVERTICULOSIS, COLON 12/04/2007   DYSPHAGIA UNSPECIFIED 03/16/2008   ERECTILE DYSFUNCTION 04/09/2007   ESOPHAGEAL STRICTURE 05/26/2008   GERD 12/04/2007   Gout    HEMORRHOIDS, RECURRENT 02/11/2008   HYPERLIPIDEMIA 12/04/2007   HYPERTENSION 03/28/2007   Kidney disease    LEG PAIN, BILATERAL 01/25/2010   LOW BACK PAIN 04/09/2007   LUNG NODULE 12/04/2007   Osteoarth NOS-Unspec 03/28/2007   Other dysphagia 11/21/2009   Peripheral neuropathy 05/29/2018   POLYARTHRALGIA 07/13/2008   Polymyalgia rheumatica (HCC) 07/26/2008   PVD WITH  CLAUDICATION 02/10/2010   RENAL INSUFFICIENCY 03/15/2010   Sleep apnea    no cpap    SLEEP APNEA, OBSTRUCTIVE, MODERATE 04/09/2007   Stroke (HCC)    TIA    TB SKIN TEST, POSITIVE 03/28/2007    Past Surgical History:  Procedure Laterality Date   COLONOSCOPY     KNEE ARTHROSCOPY Right    KNEE SURGERY Left    ROTATOR CUFF REPAIR Bilateral    TOTAL KNEE ARTHROPLASTY     x 2    Current Outpatient Medications  Medication Sig Dispense Refill   acetaminophen  (TYLENOL ) 650 MG CR tablet Take 1 tablet (650 mg total) by mouth every 6 (six) hours as needed (back pain). 30 tablet 0   allopurinol  (ZYLOPRIM ) 100 MG tablet TAKE 1 TABLET(100 MG) BY MOUTH DAILY 90 tablet 2   amLODipine  (NORVASC ) 5 MG tablet TAKE 1 TABLET(5 MG) BY MOUTH DAILY 90 tablet 1   atorvastatin  (LIPITOR) 80 MG tablet TAKE 1 TABLET(80 MG) BY MOUTH DAILY 90 tablet 3   augmented betamethasone  dipropionate (DIPROLENE -AF) 0.05 % cream Apply topically.     Cholecalciferol  50 MCG (2000 UT) TABS 1 tab by mouth once daily 30 tablet 99   dicyclomine  (BENTYL ) 10 MG capsule TAKE 1 CAPSULE(10 MG) BY MOUTH FOUR TIMES DAILY BEFORE MEALS AND AT BEDTIME 90 capsule 1   furosemide  (LASIX ) 40 MG tablet TAKE 1 AND 1/2 TABLETS(60 MG) BY MOUTH TWICE DAILY 270 tablet 2   metoprolol  succinate (TOPROL -XL) 50 MG 24 hr tablet TAKE 1 TABLET(50 MG) BY MOUTH DAILY WITH OR IMMEDIATELY FOLLOWING A MEAL 90 tablet 2  pantoprazole  (PROTONIX ) 40 MG tablet TAKE 1 TABLET(40 MG) BY MOUTH DAILY 90 tablet 3   tamsulosin  (FLOMAX ) 0.4 MG CAPS capsule TAKE 1 CAPSULE(0.4 MG) BY MOUTH TWICE DAILY 180 capsule 2   traMADol  (ULTRAM ) 50 MG tablet Take 1 tablet (50 mg total) by mouth every 6 (six) hours as needed. 60 tablet 2   vitamin B-12 (CYANOCOBALAMIN ) 1000 MCG tablet Take 1 tablet (1,000 mcg total) by mouth daily. 90 tablet 1   No current facility-administered medications for this visit.    Allergies as of 01/22/2024 - Review Complete 01/22/2024  Allergen Reaction  Noted   Ace inhibitors Swelling 09/24/2011   Amoxicillin  Other (See Comments) 07/23/2023   Augmentin  [amoxicillin -pot clavulanate] Other (See Comments) 02/03/2018   Doxycycline   12/28/2013   Prednisone  Other (See Comments) 07/23/2023   Sulfa  antibiotics Hives 07/31/2012    Family History  Problem Relation Age of Onset   Healthy Mother    Heart disease Father    Lupus Brother    Hypertension Brother    Asthma Other    Colon cancer Neg Hx    Colon polyps Neg Hx    Esophageal cancer Neg Hx    Rectal cancer Neg Hx    Stomach cancer Neg Hx     Social History   Socioeconomic History   Marital status: Widowed    Spouse name: Not on file   Number of children: 6   Years of education: Not on file   Highest education level: Not on file  Occupational History   Occupation: RETIRED/light Cabin crew  Tobacco Use   Smoking status: Former   Smokeless tobacco: Never  Advertising account planner   Vaping status: Never Used  Substance and Sexual Activity   Alcohol use: No   Drug use: No   Sexual activity: Not Currently  Other Topics Concern   Not on file  Social History Narrative   Patient does not get regular exercise.   6 children - 1 died with suicide, 1 with homicide      Lives with granddaugter and great grandson/2025      Pt does have steps in the home      Highest level of education 12th grade      Disabled      Left handed   Social Drivers of Health   Financial Resource Strain: Medium Risk (11/26/2023)   Overall Financial Resource Strain (CARDIA)    Difficulty of Paying Living Expenses: Somewhat hard  Food Insecurity: No Food Insecurity (11/26/2023)   Hunger Vital Sign    Worried About Running Out of Food in the Last Year: Never true    Ran Out of Food in the Last Year: Never true  Transportation Needs: No Transportation Needs (11/26/2023)   PRAPARE - Administrator, Civil Service (Medical): No    Lack of Transportation (Non-Medical): No  Physical Activity:  Inactive (11/26/2023)   Exercise Vital Sign    Days of Exercise per Week: 0 days    Minutes of Exercise per Session: 0 min  Stress: No Stress Concern Present (11/26/2023)   [employer] of Occupational Health - Occupational Stress Questionnaire    Feeling of Stress : Not at all  Social Connections: Moderately Integrated (11/26/2023)   Social Connection and Isolation Panel [NHANES]    Frequency of Communication with Friends and Family: More than three times a week    Frequency of Social Gatherings with Friends and Family: Once a week    Attends Religious Services: More  than 4 times per year    Active Member of Clubs or Organizations: Yes    Attends Banker Meetings: Never    Marital Status: Widowed  Intimate Partner Violence: Patient Unable To Answer (11/26/2023)   Humiliation, Afraid, Rape, and Kick questionnaire    Fear of Current or Ex-Partner: Patient unable to answer    Emotionally Abused: Patient unable to answer    Physically Abused: Patient unable to answer    Sexually Abused: Patient unable to answer    Review of Systems:    Constitutional: No weight loss, fever, chills, weakness or fatigue HEENT: Eyes: No change in vision               Ears, Nose, Throat:  No change in hearing or congestion Skin: No rash or itching Cardiovascular: No chest pain, chest pressure or palpitations   Respiratory: No SOB or cough Gastrointestinal: See HPI and otherwise negative Genitourinary: No dysuria or change in urinary frequency Neurological: No headache, dizziness or syncope Musculoskeletal: No new muscle or joint pain Hematologic: No bleeding or bruising Psychiatric: No history of depression or anxiety    Physical Exam:  Vital signs: BP 130/60 (BP Location: Left Arm, Patient Position: Sitting, Cuff Size: Large)   Pulse 60   Ht 5' 7.5 (1.715 m) Comment: height measured without shoes  Wt 240 lb 4 oz (109 kg)   BMI 37.07 kg/m   Constitutional: NAD, alert and  cooperative-appears significantly younger than stated age Head:  Normocephalic and atraumatic. Eyes:   PEERL, EOMI. No icterus. Conjunctiva pink. Respiratory: Respirations even and unlabored. Lungs clear to auscultation bilaterally.   No wheezes, crackles, or rhonchi.  Cardiovascular:  Regular rate and rhythm. No peripheral edema, cyanosis or pallor.  Gastrointestinal:  Soft, nondistended, nontender. No rebound or guarding. Normal bowel sounds. No appreciable masses or hepatomegaly. Rectal:  Declines Msk:  Symmetrical without gross deformities. Without edema, no deformity or joint abnormality.  Neurologic:  Alert and  oriented x4;  grossly normal neurologically.  Skin:   Dry and intact without significant lesions or rashes. Psychiatric: Oriented to person, place and time. Demonstrates good judgement and reason without abnormal affect or behaviors.  Physical Exam    RELEVANT LABS AND IMAGING: CBC    Component Value Date/Time   WBC 4.0 03/15/2023 0438   RBC 4.23 03/15/2023 0438   HGB 11.4 (L) 03/15/2023 0438   HCT 35.4 (L) 03/15/2023 0438   PLT 222 03/15/2023 0438   MCV 83.7 03/15/2023 0438   MCV 82.1 11/13/2015 1228   MCH 27.0 03/15/2023 0438   MCHC 32.2 03/15/2023 0438   RDW 14.6 03/15/2023 0438   LYMPHSABS 1.3 10/29/2022 1022   MONOABS 0.6 10/29/2022 1022   EOSABS 0.2 10/29/2022 1022   BASOSABS 0.0 10/29/2022 1022    CMP     Component Value Date/Time   NA 140 03/15/2023 0438   K 4.4 03/15/2023 0438   CL 106 03/15/2023 0438   CO2 26 03/15/2023 0438   GLUCOSE 98 03/15/2023 0438   BUN 29 (H) 03/15/2023 0438   CREATININE 2.39 (H) 03/15/2023 0438   CREATININE 0.68 (L) 03/09/2020 0930   CALCIUM  8.9 03/15/2023 0438   PROT 6.1 10/29/2022 1022   ALBUMIN 3.2 (L) 10/29/2022 1022   AST 15 10/29/2022 1022   ALT 9 10/29/2022 1022   ALKPHOS 121 (H) 10/29/2022 1022   BILITOT 0.4 10/29/2022 1022   GFRNONAA 27 (L) 03/15/2023 0438   GFRNONAA 95 03/09/2020 0930   GFRAA 110  03/09/2020 0930     Assessment/Plan:    Assessment & Plan  Colorectal cancer screening He is due for screening. Last Cologuard in 2022 was negative. Asymptomatic, low risk, prefers non-invasive methods.  No family history of colon cancer.  Colonoscopy 2012 was normal.   - Long discussion with the patient about cologuard versus colonoscopy without family history, no personal history of polyps and no symptoms at this time.   - discussed false positives with patient and he understands risk of that and possibly still needing a colonoscopy in the future. - Order Cologuard test to be mailed to his home. - obtain previous [place] from [place] (reported in 2016 but his last one documented was with Dr. Arvie Latus in 2012)  History of esophageal strictures EGD 2012 with stricture s/p [facility] dilation  CVA (2011)  Assigned to Dr. Elvin Hammer today (Wednesday)  Marnae Madani Lorina [facility] 01/22/2024, 11:10 AM  Cc: Roslyn Coombe, MD

## 2024-01-23 NOTE — Progress Notes (Signed)
 Noted

## 2024-01-27 ENCOUNTER — Ambulatory Visit (INDEPENDENT_AMBULATORY_CARE_PROVIDER_SITE_OTHER): Admitting: Internal Medicine

## 2024-01-27 ENCOUNTER — Encounter: Payer: Self-pay | Admitting: Internal Medicine

## 2024-01-27 VITALS — BP 160/82 | HR 52 | Temp 98.0°F | Ht 67.5 in | Wt 241.0 lb

## 2024-01-27 DIAGNOSIS — E538 Deficiency of other specified B group vitamins: Secondary | ICD-10-CM

## 2024-01-27 DIAGNOSIS — R7302 Impaired glucose tolerance (oral): Secondary | ICD-10-CM

## 2024-01-27 DIAGNOSIS — N184 Chronic kidney disease, stage 4 (severe): Secondary | ICD-10-CM

## 2024-01-27 DIAGNOSIS — E559 Vitamin D deficiency, unspecified: Secondary | ICD-10-CM | POA: Diagnosis not present

## 2024-01-27 DIAGNOSIS — E7800 Pure hypercholesterolemia, unspecified: Secondary | ICD-10-CM | POA: Diagnosis not present

## 2024-01-27 DIAGNOSIS — M1990 Unspecified osteoarthritis, unspecified site: Secondary | ICD-10-CM | POA: Diagnosis not present

## 2024-01-27 DIAGNOSIS — I10 Essential (primary) hypertension: Secondary | ICD-10-CM | POA: Diagnosis not present

## 2024-01-27 MED ORDER — PANTOPRAZOLE SODIUM 40 MG PO TBEC: 40.0000 mg | DELAYED_RELEASE_TABLET | Freq: Every day | ORAL | 3 refills | Status: DC

## 2024-01-27 NOTE — Assessment & Plan Note (Signed)
Lab Results  Component Value Date   [id number] 10/29/2022   Stable, cont oral replacement - b12 1000 mcg qd

## 2024-01-27 NOTE — Assessment & Plan Note (Signed)
Lab Results  Component Value Date   HGBA1C 5.6 03/18/2023   Stable, pt to continue current medical treatment  - diet, wt control

## 2024-01-27 NOTE — Patient Instructions (Addendum)
 Please have your Shingrix (shingles) shots done at your local pharmacy, and the Tdap tetanus shot as well.  Ok to take the OTC Tumeric for joint pain, as well as the topical gel (diclofenac )  Please continue all other medications as before, and refills have been done if requested.  Please have the pharmacy call with any other refills you may need.  Please continue your efforts at being more active, low cholesterol diet, and weight control.  Please keep your appointments with your specialists as you may have planned - kidney doctor next month  Please make an Appointment to return in 6 months, or sooner if needed

## 2024-01-27 NOTE — Assessment & Plan Note (Addendum)
Last vitamin D Lab Results  Component Value Date   VD25OH 30.68 10/29/2022   Low, to start oral replacement

## 2024-01-27 NOTE — Assessment & Plan Note (Signed)
 BP Readings from Last 3 Encounters:  01/27/24 (!) 160/82  01/22/24 130/60  10/25/23 122/66   Uncontrolled, BP has been controlled at home,  pt to continue medical treatment norvasc  5 every day, metoprolol  xl 50 every day, declines other change

## 2024-01-27 NOTE — Assessment & Plan Note (Signed)
 Pt ok for tumeric otc daily prn, and /or volt gel prn

## 2024-01-27 NOTE — Assessment & Plan Note (Signed)
 Lab Results  Component Value Date   CREATININE 2.39 (H) 03/15/2023   Stable overall, cont to avoid nephrotoxins, f/u renal as planned

## 2024-01-27 NOTE — Assessment & Plan Note (Signed)
 Lab Results  Component Value Date   LDLCALC 86 10/29/2022   Ucnontrolled, pt to continue current statin lipitor 80 every day, declines other zetia or repatha, declines lab today

## 2024-01-27 NOTE — Progress Notes (Signed)
 Patient ID: [name], male   DOB: 1946/01/01, 78 y.o.   MRN: [id number]        Chief Complaint: follow up hyperglycemia, htn, hld, low b12 and D, arthritis       HPI:  Troy Palmer is a 78 y.o. male here with persistent joint pain mild , seemed better with omega XL but now cannot find more in the store.  Pt denies chest pain, increased sob or doe, wheezing, orthopnea, PND, increased LE swelling, palpitations, dizziness or syncope.   Pt denies polydipsia, polyuria, or new focal neuro s/s.    Pt denies fever, wt loss, night sweats, loss of appetite, or other constitutional symptoms  Has f/u with renal next month with labs  BP has been controlled at home.         Wt Readings from Last 3 Encounters:  01/27/24 241 lb (109.3 kg)  01/22/24 240 lb 4 oz (109 kg)  11/26/23 242 lb (109.8 kg)   BP Readings from Last 3 Encounters:  01/27/24 (!) 160/82  01/22/24 130/60  10/25/23 122/66         Past Medical History:  Diagnosis Date   ALLERGIC RHINITIS 04/09/2007   Allergy     ASTHMA 12/04/2007   BACK PAIN 09/16/2009   BENIGN PROSTATIC HYPERTROPHY 04/09/2007   CEREBROVASCULAR ACCIDENT, HX OF 07/17/2010   COLONIC POLYPS, HX OF 12/04/2007   CONSTIPATION 09/25/2010   Degeneration of cervical intervertebral disc 03/28/2007   DEPRESSION 12/04/2007   DIVERTICULOSIS, COLON 12/04/2007   DYSPHAGIA UNSPECIFIED 03/16/2008   ERECTILE DYSFUNCTION 04/09/2007   ESOPHAGEAL STRICTURE 05/26/2008   GERD 12/04/2007   Gout    HEMORRHOIDS, RECURRENT 02/11/2008   HYPERLIPIDEMIA 12/04/2007   HYPERTENSION 03/28/2007   Kidney disease    LEG PAIN, BILATERAL 01/25/2010   LOW BACK PAIN 04/09/2007   LUNG NODULE 12/04/2007   Osteoarth NOS-Unspec 03/28/2007   Other dysphagia 11/21/2009   Peripheral neuropathy 05/29/2018   POLYARTHRALGIA 07/13/2008   Polymyalgia rheumatica (HCC) 07/26/2008   PVD WITH CLAUDICATION 02/10/2010   RENAL INSUFFICIENCY 03/15/2010   Sleep apnea    no cpap    SLEEP APNEA,  OBSTRUCTIVE, MODERATE 04/09/2007   Stroke (HCC)    TIA    TB SKIN TEST, POSITIVE 03/28/2007   Past Surgical History:  Procedure Laterality Date   COLONOSCOPY     KNEE ARTHROSCOPY Right    KNEE SURGERY Left    ROTATOR CUFF REPAIR Bilateral    TOTAL KNEE ARTHROPLASTY     x 2    reports that he has quit smoking. He has never used smokeless tobacco. He reports that he does not drink alcohol and does not use drugs. family history includes Asthma in an other family member; Healthy in his mother; Heart disease in his father; Hypertension in his brother; Lupus in his brother. Allergies  Allergen Reactions   Ace Inhibitors Swelling    Angioedema throat   Amoxicillin  Other (See Comments)   Augmentin  [Amoxicillin -Pot Clavulanate] Other (See Comments)    Marked weakness with po med x 3 doses   Doxycycline      Some GI upset   Prednisone  Other (See Comments)   Sulfa  Antibiotics Hives    And facial angioedema   Current Outpatient Medications on File Prior to Visit  Medication Sig Dispense Refill   acetaminophen  (TYLENOL ) 650 MG CR tablet Take 1 tablet (650 mg total) by mouth every 6 (six) hours as needed (back pain). 30 tablet 0   allopurinol  (ZYLOPRIM ) 100 MG tablet  TAKE 1 TABLET(100 MG) BY MOUTH DAILY 90 tablet 2   amLODipine  (NORVASC ) 5 MG tablet TAKE 1 TABLET(5 MG) BY MOUTH DAILY 90 tablet 1   atorvastatin  (LIPITOR) 80 MG tablet TAKE 1 TABLET(80 MG) BY MOUTH DAILY 90 tablet 3   augmented betamethasone  dipropionate (DIPROLENE -AF) 0.05 % cream Apply topically.     Cholecalciferol  50 MCG (2000 UT) TABS 1 tab by mouth once daily 30 tablet 99   dicyclomine  (BENTYL ) 10 MG capsule TAKE 1 CAPSULE(10 MG) BY MOUTH FOUR TIMES DAILY BEFORE MEALS AND AT BEDTIME 90 capsule 1   furosemide  (LASIX ) 40 MG tablet TAKE 1 AND 1/2 TABLETS(60 MG) BY MOUTH TWICE DAILY 270 tablet 2   metoprolol  succinate (TOPROL -XL) 50 MG 24 hr tablet TAKE 1 TABLET(50 MG) BY MOUTH DAILY WITH OR IMMEDIATELY FOLLOWING A MEAL 90  tablet 2   tamsulosin  (FLOMAX ) 0.4 MG CAPS capsule TAKE 1 CAPSULE(0.4 MG) BY MOUTH TWICE DAILY 180 capsule 2   traMADol  (ULTRAM ) 50 MG tablet Take 1 tablet (50 mg total) by mouth every 6 (six) hours as needed. 60 tablet 2   vitamin B-12 (CYANOCOBALAMIN ) 1000 MCG tablet Take 1 tablet (1,000 mcg total) by mouth daily. 90 tablet 1   No current facility-administered medications on file prior to visit.        ROS:  All others reviewed and negative.  Objective        PE:  BP (!) 160/82 (BP Location: Left Arm, Patient Position: Sitting, Cuff Size: Normal)   Pulse (!) 52   Temp 98 F (36.7 C) (Oral)   Ht 5' 7.5 (1.715 m)   Wt 241 lb (109.3 kg)   SpO2 98%   BMI 37.19 kg/m                 Constitutional: Pt appears in NAD               HENT: Head: NCAT.                Right Ear: External ear normal.                 Left Ear: External ear normal.                Eyes: . Pupils are equal, round, and reactive to light. Conjunctivae and EOM are normal               Nose: without d/c or deformity               Neck: Neck supple. Gross normal ROM               Cardiovascular: Normal rate and regular rhythm.                 Pulmonary/Chest: Effort normal and breath sounds without rales or wheezing.                Abd:  Soft, NT, ND, + BS, no organomegaly               Neurological: Pt is alert. At baseline orientation, motor grossly intact               Skin: Skin is warm. No rashes, no other new lesions, LE edema - none               Psychiatric: Pt behavior is normal without agitation   Micro: none  Cardiac tracings I have personally interpreted today:  none  Pertinent  Radiological findings (summarize): none   Lab Results  Component Value Date   WBC 4.0 03/15/2023   HGB 11.4 (L) 03/15/2023   HCT 35.4 (L) 03/15/2023   PLT 222 03/15/2023   GLUCOSE 98 03/15/2023   CHOL 145 10/29/2022   TRIG 69.0 10/29/2022   HDL 44.80 10/29/2022   LDLDIRECT 156.9 10/10/2010   LDLCALC 86 10/29/2022    ALT 9 10/29/2022   AST 15 10/29/2022   NA 140 03/15/2023   K 4.4 03/15/2023   CL 106 03/15/2023   CREATININE 2.39 (H) 03/15/2023   BUN 29 (H) 03/15/2023   CO2 26 03/15/2023   TSH 2.14 10/29/2022   PSA 1.09 10/29/2022   HGBA1C 5.6 03/18/2023   MICROALBUR 25.7 (H) 10/29/2022   Assessment/Plan:  Troy Palmer is a 78 y.o. Black or African American [2] male with  has a past medical history of ALLERGIC RHINITIS (04/09/2007), Allergy , ASTHMA (12/04/2007), BACK PAIN (09/16/2009), BENIGN PROSTATIC HYPERTROPHY (04/09/2007), CEREBROVASCULAR ACCIDENT, HX OF (07/17/2010), COLONIC POLYPS, HX OF (12/04/2007), CONSTIPATION (09/25/2010), Degeneration of cervical intervertebral disc (03/28/2007), DEPRESSION (12/04/2007), DIVERTICULOSIS, COLON (12/04/2007), DYSPHAGIA UNSPECIFIED (03/16/2008), ERECTILE DYSFUNCTION (04/09/2007), ESOPHAGEAL STRICTURE (05/26/2008), GERD (12/04/2007), Gout, HEMORRHOIDS, RECURRENT (02/11/2008), HYPERLIPIDEMIA (12/04/2007), HYPERTENSION (03/28/2007), Kidney disease, LEG PAIN, BILATERAL (01/25/2010), LOW BACK PAIN (04/09/2007), LUNG NODULE (12/04/2007), Osteoarth NOS-Unspec (03/28/2007), Other dysphagia (11/21/2009), Peripheral neuropathy (05/29/2018), POLYARTHRALGIA (07/13/2008), Polymyalgia rheumatica (HCC) (07/26/2008), PVD WITH CLAUDICATION (02/10/2010), RENAL INSUFFICIENCY (03/15/2010), Sleep apnea, SLEEP APNEA, OBSTRUCTIVE, MODERATE (04/09/2007), Stroke (HCC), and TB SKIN TEST, POSITIVE (03/28/2007).  B12 deficiency Lab Results  Component Value Date   VITAMINB12 481 10/29/2022   Stable, cont oral replacement - b12 1000 mcg qd   Vitamin D  deficiency Last vitamin D  Lab Results  Component Value Date   VD25OH 30.68 10/29/2022   Low, to start oral replacement   CKD (chronic kidney disease) stage 4, GFR 15-29 ml/min (HCC) Lab Results  Component Value Date   CREATININE 2.39 (H) 03/15/2023   Stable overall, cont to avoid nephrotoxins, f/u renal as  planned  Essential hypertension BP Readings from Last 3 Encounters:  01/27/24 (!) 160/82  01/22/24 130/60  10/25/23 122/66   Uncontrolled, BP has been controlled at home,  pt to continue medical treatment norvasc  5 every day, metoprolol  xl 50 every day, declines other change   Hypercholesterolemia Lab Results  Component Value Date   LDLCALC 86 10/29/2022   Ucnontrolled, pt to continue current statin lipitor 80 every day, declines other zetia or repatha, declines lab today   Impaired glucose tolerance Lab Results  Component Value Date   HGBA1C 5.6 03/18/2023   Stable, pt to continue current medical treatment  - diet, wt control   Arthritis Pt ok for tumeric otc daily prn, and /or volt gel prn  Followup: Return in about 6 months (around 07/28/2024).  Rosalia Colonel, MD 01/27/2024 1:01 PM [facility]

## 2024-02-13 ENCOUNTER — Other Ambulatory Visit: Payer: Self-pay | Admitting: Internal Medicine

## 2024-02-23 ENCOUNTER — Other Ambulatory Visit: Payer: Self-pay | Admitting: Internal Medicine

## 2024-03-27 DIAGNOSIS — M109 Gout, unspecified: Secondary | ICD-10-CM | POA: Diagnosis not present

## 2024-03-27 DIAGNOSIS — N183 Chronic kidney disease, stage 3 unspecified: Secondary | ICD-10-CM | POA: Diagnosis not present

## 2024-03-27 DIAGNOSIS — I129 Hypertensive chronic kidney disease with stage 1 through stage 4 chronic kidney disease, or unspecified chronic kidney disease: Secondary | ICD-10-CM | POA: Diagnosis not present

## 2024-04-08 ENCOUNTER — Emergency Department (HOSPITAL_COMMUNITY)

## 2024-04-08 ENCOUNTER — Emergency Department (HOSPITAL_BASED_OUTPATIENT_CLINIC_OR_DEPARTMENT_OTHER)
Admission: EM | Admit: 2024-04-08 | Discharge: 2024-04-08 | Disposition: A | Discharge status: Home / Self Care | Attending: Emergency Medicine | Admitting: Emergency Medicine

## 2024-04-08 ENCOUNTER — Emergency Department (HOSPITAL_BASED_OUTPATIENT_CLINIC_OR_DEPARTMENT_OTHER)

## 2024-04-08 ENCOUNTER — Encounter (HOSPITAL_BASED_OUTPATIENT_CLINIC_OR_DEPARTMENT_OTHER): Payer: Self-pay

## 2024-04-08 ENCOUNTER — Other Ambulatory Visit: Payer: Self-pay

## 2024-04-08 DIAGNOSIS — I130 Hypertensive heart and chronic kidney disease with heart failure and stage 1 through stage 4 chronic kidney disease, or unspecified chronic kidney disease: Secondary | ICD-10-CM | POA: Insufficient documentation

## 2024-04-08 DIAGNOSIS — R7989 Other specified abnormal findings of blood chemistry: Secondary | ICD-10-CM

## 2024-04-08 DIAGNOSIS — Z79899 Other long term (current) drug therapy: Secondary | ICD-10-CM | POA: Insufficient documentation

## 2024-04-08 DIAGNOSIS — I517 Cardiomegaly: Secondary | ICD-10-CM | POA: Diagnosis not present

## 2024-04-08 DIAGNOSIS — R079 Chest pain, unspecified: Secondary | ICD-10-CM | POA: Diagnosis not present

## 2024-04-08 DIAGNOSIS — D72819 Decreased white blood cell count, unspecified: Secondary | ICD-10-CM | POA: Diagnosis not present

## 2024-04-08 DIAGNOSIS — M25512 Pain in left shoulder: Secondary | ICD-10-CM | POA: Diagnosis not present

## 2024-04-08 DIAGNOSIS — I509 Heart failure, unspecified: Secondary | ICD-10-CM | POA: Insufficient documentation

## 2024-04-08 DIAGNOSIS — R001 Bradycardia, unspecified: Secondary | ICD-10-CM | POA: Diagnosis not present

## 2024-04-08 DIAGNOSIS — M549 Dorsalgia, unspecified: Secondary | ICD-10-CM | POA: Diagnosis not present

## 2024-04-08 DIAGNOSIS — D649 Anemia, unspecified: Secondary | ICD-10-CM | POA: Diagnosis not present

## 2024-04-08 DIAGNOSIS — R0789 Other chest pain: Secondary | ICD-10-CM | POA: Diagnosis not present

## 2024-04-08 DIAGNOSIS — J45991 Cough variant asthma: Secondary | ICD-10-CM | POA: Diagnosis not present

## 2024-04-08 DIAGNOSIS — R791 Abnormal coagulation profile: Secondary | ICD-10-CM | POA: Insufficient documentation

## 2024-04-08 DIAGNOSIS — M546 Pain in thoracic spine: Secondary | ICD-10-CM | POA: Diagnosis not present

## 2024-04-08 DIAGNOSIS — N189 Chronic kidney disease, unspecified: Secondary | ICD-10-CM | POA: Insufficient documentation

## 2024-04-08 DIAGNOSIS — Z0389 Encounter for observation for other suspected diseases and conditions ruled out: Secondary | ICD-10-CM | POA: Diagnosis not present

## 2024-04-08 LAB — CBC
HCT: 33.3 % — ABNORMAL LOW (ref 39.0–52.0)
Hemoglobin: 10.8 g/dL — ABNORMAL LOW (ref 13.0–17.0)
MCH: 27.2 pg (ref 26.0–34.0)
MCHC: 32.4 g/dL (ref 30.0–36.0)
MCV: 83.9 fL (ref 80.0–100.0)
Platelets: 193 K/uL (ref 150–400)
RBC: 3.97 MIL/uL — ABNORMAL LOW (ref 4.22–5.81)
RDW: 14.8 % (ref 11.5–15.5)
WBC: 3.8 K/uL — ABNORMAL LOW (ref 4.0–10.5)
nRBC: 0 % (ref 0.0–0.2)

## 2024-04-08 LAB — COMPREHENSIVE METABOLIC PANEL WITH GFR
ALT: 10 U/L (ref 0–44)
AST: 19 U/L (ref 15–41)
Albumin: 3.6 g/dL (ref 3.5–5.0)
Alkaline Phosphatase: 124 U/L (ref 38–126)
Anion gap: 9 (ref 5–15)
BUN: 25 mg/dL — ABNORMAL HIGH (ref 8–23)
CO2: 26 mmol/L (ref 22–32)
Calcium: 9 mg/dL (ref 8.9–10.3)
Chloride: 105 mmol/L (ref 98–111)
Creatinine, Ser: 2.46 mg/dL — ABNORMAL HIGH (ref 0.61–1.24)
GFR, Estimated: 26 mL/min — ABNORMAL LOW (ref >60)
Glucose, Bld: 86 mg/dL (ref 70–99)
Potassium: 4.9 mmol/L (ref 3.5–5.1)
Sodium: 141 mmol/L (ref 135–145)
Total Bilirubin: 0.4 mg/dL (ref 0.0–1.2)
Total Protein: 6.5 g/dL (ref 6.5–8.1)

## 2024-04-08 LAB — TROPONIN T, HIGH SENSITIVITY
Troponin T High Sensitivity: 23 ng/L — ABNORMAL HIGH (ref 0–19)
Troponin T High Sensitivity: 23 ng/L — ABNORMAL HIGH (ref 0–19)

## 2024-04-08 LAB — D-DIMER, QUANTITATIVE: D-Dimer, Quant: 2.27 ug{FEU}/mL — ABNORMAL HIGH (ref 0.00–0.50)

## 2024-04-08 LAB — PRO BRAIN NATRIURETIC PEPTIDE: Pro Brain Natriuretic Peptide: 499 pg/mL — ABNORMAL HIGH (ref <300.0)

## 2024-04-08 LAB — LIPASE, BLOOD: Lipase: 35 U/L (ref 11–51)

## 2024-04-08 MED ORDER — FENTANYL CITRATE PF 50 MCG/ML IJ SOSY
25.0000 ug | PREFILLED_SYRINGE | Freq: Once | INTRAMUSCULAR | Status: AC
  Administered 2024-04-08: 25 ug via INTRAVENOUS
  Filled 2024-04-08: qty 1

## 2024-04-08 MED ORDER — LIDOCAINE 5 % EX PTCH
1.0000 | MEDICATED_PATCH | CUTANEOUS | Status: DC
  Administered 2024-04-08: 1 via TRANSDERMAL
  Filled 2024-04-08: qty 1

## 2024-04-08 MED ORDER — TECHNETIUM TO 99M ALBUMIN AGGREGATED
4.4000 | Freq: Once | INTRAVENOUS | Status: AC
  Administered 2024-04-08: 4.4 via INTRAVENOUS

## 2024-04-08 NOTE — ED Provider Notes (Signed)
 [place] EMERGENCY DEPARTMENT AT [facility] Provider Note   CSN: [id number] Arrival date & time: 04/08/24  9165     Patient presents with: Back Pain   Troy Palmer is a 78 y.o. male with history of hypertension, hyperlipidemia, polymyalgia rheumatica presents with complaints of back pain that has been ongoing for the past 4 days.  States it radiates to his chest.  Is not associate with any shortness of breath, dizziness or nausea.  Is worse with movement.  Denies any cardiac history or prior blood clots.  Is not on blood thinners.  No cough, congestion or fevers.  No injury or trauma.  Denies any new numbness or tingling.  States that he has had this pain before.  Evaluated at [facility].  Referred here for perfusion study PE rule out.    Back Pain     Past Medical History:  Diagnosis Date   ALLERGIC RHINITIS 04/09/2007   Allergy     ASTHMA 12/04/2007   BACK PAIN 09/16/2009   BENIGN PROSTATIC HYPERTROPHY 04/09/2007   CEREBROVASCULAR ACCIDENT, HX OF 07/17/2010   COLONIC POLYPS, HX OF 12/04/2007   CONSTIPATION 09/25/2010   Degeneration of cervical intervertebral disc 03/28/2007   DEPRESSION 12/04/2007   DIVERTICULOSIS, COLON 12/04/2007   DYSPHAGIA UNSPECIFIED 03/16/2008   ERECTILE DYSFUNCTION 04/09/2007   ESOPHAGEAL STRICTURE 05/26/2008   GERD 12/04/2007   Gout    HEMORRHOIDS, RECURRENT 02/11/2008   HYPERLIPIDEMIA 12/04/2007   HYPERTENSION 03/28/2007   Kidney disease    LEG PAIN, BILATERAL 01/25/2010   LOW BACK PAIN 04/09/2007   LUNG NODULE 12/04/2007   Osteoarth NOS-Unspec 03/28/2007   Other dysphagia 11/21/2009   Peripheral neuropathy 05/29/2018   POLYARTHRALGIA 07/13/2008   Polymyalgia rheumatica (HCC) 07/26/2008   PVD WITH CLAUDICATION 02/10/2010   RENAL INSUFFICIENCY 03/15/2010   Sleep apnea    no cpap    SLEEP APNEA, OBSTRUCTIVE, MODERATE 04/09/2007   Stroke (HCC)    TIA    TB SKIN TEST, POSITIVE 03/28/2007   Past Surgical History:   Procedure Laterality Date   COLONOSCOPY     KNEE ARTHROSCOPY Right    KNEE SURGERY Left    ROTATOR CUFF REPAIR Bilateral    TOTAL KNEE ARTHROPLASTY     x 2     Prior to Admission medications   Medication Sig Start Date End Date Taking? Authorizing Provider  acetaminophen  (TYLENOL ) 650 MG CR tablet Take 1 tablet (650 mg total) by mouth every 6 (six) hours as needed (back pain). 03/02/21   Nivia Colon, PA-C  allopurinol  (ZYLOPRIM ) 100 MG tablet TAKE 1 TABLET(100 MG) BY MOUTH DAILY 09/18/23   Norleen Lynwood ORN, MD  amLODipine  (NORVASC ) 5 MG tablet TAKE 1 TABLET BY MOUTH EVERY DAY 02/24/24   Norleen Lynwood ORN, MD  atorvastatin  (LIPITOR) 80 MG tablet TAKE 1 TABLET(80 MG) BY MOUTH DAILY 11/22/23   Norleen Lynwood ORN, MD  augmented betamethasone  dipropionate (DIPROLENE -AF) 0.05 % cream Apply topically. 10/09/23   [provider]  Cholecalciferol  50 MCG (2000 UT) TABS 1 tab by mouth once daily 10/04/21   Norleen Lynwood ORN, MD  dicyclomine  (BENTYL ) 10 MG capsule TAKE 1 CAPSULE(10 MG) BY MOUTH FOUR TIMES DAILY BEFORE MEALS AND AT BEDTIME 05/20/23   Norleen Lynwood ORN, MD  furosemide  (LASIX ) 40 MG tablet TAKE 1&1/2 TABLET TWICE DAILY 02/24/24   Norleen Lynwood ORN, MD  metoprolol  succinate (TOPROL -XL) 50 MG 24 hr tablet TAKE 1 TABLET(50 MG) BY MOUTH DAILY WITH OR IMMEDIATELY FOLLOWING A MEAL 08/19/23  Norleen Lynwood ORN, MD  pantoprazole  (PROTONIX ) 40 MG tablet TAKE 1 TABLET(40 MG) BY MOUTH DAILY 02/13/24   Norleen Lynwood ORN, MD  tamsulosin  (FLOMAX ) 0.4 MG CAPS capsule TAKE 1 CAPSULE BY MOUTH TWICE A DAY 02/24/24   Norleen Lynwood ORN, MD  traMADol  (ULTRAM ) 50 MG tablet Take 1 tablet (50 mg total) by mouth every 6 (six) hours as needed. 10/25/23   Norleen Lynwood ORN, MD  vitamin B-12 (CYANOCOBALAMIN ) 1000 MCG tablet Take 1 tablet (1,000 mcg total) by mouth daily. 12/03/19   Norleen Lynwood ORN, MD    Allergies: Ace inhibitors, Amoxicillin , Augmentin  [amoxicillin -pot clavulanate], Doxycycline , Prednisone , and Sulfa  antibiotics    Review of Systems   Musculoskeletal:  Positive for back pain.    Updated Vital Signs BP (!) 158/68 (BP Location: Left Arm)   Pulse (!) 55   Temp 97.8 F (36.6 C) (Oral)   Resp 18   Ht 5' 7.5 (1.715 m)   Wt 111.1 kg   SpO2 99%   BMI 37.81 kg/m   Physical Exam Vitals and nursing note reviewed.  Constitutional:      General: He is not in acute distress.    Appearance: He is well-developed.  HENT:     Head: Normocephalic and atraumatic.  Eyes:     Conjunctiva/sclera: Conjunctivae normal.  Cardiovascular:     Rate and Rhythm: Normal rate and regular rhythm.     Heart sounds: No murmur heard. Pulmonary:     Effort: Pulmonary effort is normal. No respiratory distress.     Breath sounds: Normal breath sounds.  Abdominal:     Palpations: Abdomen is soft.     Tenderness: There is no abdominal tenderness.  Musculoskeletal:        General: No swelling.     Cervical back: Neck supple.     Comments: Right-sided thoracic paraspinal tenderness along the medial scapular border, no overlying erythema, warmth or gross deformities, no significant lower extremity swelling or tenderness  Skin:    General: Skin is warm and dry.     Capillary Refill: Capillary refill takes less than 2 seconds.  Neurological:     General: No focal deficit present.     Mental Status: He is alert.  Psychiatric:        Mood and Affect: Mood normal.     (all labs ordered are listed, but only abnormal results are displayed) Labs Reviewed  COMPREHENSIVE METABOLIC PANEL WITH GFR - Abnormal; Notable for the following components:      Result Value   BUN 25 (*)    Creatinine, Ser 2.46 (*)    GFR, Estimated 26 (*)    All other components within normal limits  CBC - Abnormal; Notable for the following components:   WBC 3.8 (*)    RBC 3.97 (*)    Hemoglobin 10.8 (*)    HCT 33.3 (*)    All other components within normal limits  D-DIMER, QUANTITATIVE - Abnormal; Notable for the following components:   D-Dimer, Quant 2.27 (*)     All other components within normal limits  TROPONIN T, HIGH SENSITIVITY - Abnormal; Notable for the following components:   Troponin T High Sensitivity 23 (*)    All other components within normal limits  TROPONIN T, HIGH SENSITIVITY - Abnormal; Notable for the following components:   Troponin T High Sensitivity 23 (*)    All other components within normal limits  LIPASE, BLOOD  PRO BRAIN NATRIURETIC PEPTIDE    EKG: EKG Interpretation  Date/Time:  Wednesday April 08 2024 08:44:30 EDT Ventricular Rate:  58 PR Interval:  192 QRS Duration:  90 QT Interval:  398 QTC Calculation: 390 R Axis:   30  Text Interpretation: Sinus bradycardia Early repolarization Otherwise normal ECG When compared with ECG of 15-Mar-2023 04:39, Criteria for Anterior infarct are no longer Present Confirmed by Ruthe Cornet [id number]) on 04/08/2024 8:45:50 AM  Radiology: [facility] Chest Port 1 View Result Date: 04/08/2024 CLINICAL DATA:  Chest pain and back pain. EXAM: PORTABLE CHEST 1 VIEW COMPARISON:  03/15/2023 and CT chest 03/25/2008. FINDINGS: Patient is rotated. Trachea is midline. Heart is enlarged. Lungs are clear. No pleural fluid. IMPRESSION: No acute findings. Electronically Signed   By: Newell Eke M.D.   On: 04/08/2024 09:50     Ultrasound ED Echo  Date/Time: 04/08/2024 1:32 PM  Performed by: Donnajean Lynwood DEL, PA-C Authorized by: Donnajean Lynwood DEL, PA-C   Procedure details:    Indications: chest pain     Views: parasternal long axis view, parasternal short axis view and apical 4 chamber view     Images: archived     Limitations:  Acoustic shadowing Findings:    Pericardium: no pericardial effusion     LV Function: normal (>50% EF)     RV Diameter: normal     Other signs of RV strain: paradoxical septal motion   Impression:    Impression: normal      Medications Ordered in the ED  fentaNYL  (SUBLIMAZE ) injection 25 mcg (25 mcg Intravenous Given 04/08/24 1109)    Clinical Course as of  04/08/24 1404  Wed Apr 08, 2024  1321 Patient evaluated with complaints of exertional back pain that radiates to his chest x 4 days.  He is hemodynamically stable.  He does have reproducible tenderness to right sided thoracic paraspinal region.  No significant unilateral swelling or tenderness in his lower extremities.  His lung sounds are clear.  Referred here for perfusion study in the setting of elevated dimer and mildly elevated but flat troponins with CKD. [JT]  1323 Comprehensive metabolic panel(!) Creatinine elevated at 2.46, at baseline [JT]  1323 CBC(!) No leukocytosis, hemoglobin stable [JT]  1323 D-dimer, quantitative(!) Elevated 2.27 [JT]  1323 Lipase, blood Within normal limits [JT]  1323 Troponin T, High Sensitivity(!) Elevated to 23, flat [JT]  1325 EKG 12-Lead Sinus bradycardia, early repolarization [JT]  1326 DG Chest Port 1 View No acute findings [JT]  1401 Called nuclear medicine.  Confirmed study will be able to be performed here in the ED. [JT]    Clinical Course User Index [JT] Donnajean Lynwood DEL, PA-C                                 Medical Decision Making Amount and/or Complexity of Data Reviewed Labs: ordered. Radiology: ordered.  Risk Prescription drug management.   This patient presents to the ED with chief complaint(s) of back pain.  The complaint involves an extensive differential diagnosis and also carries with it a high risk of complications and morbidity.   Pertinent past medical history as listed in HPI  The differential diagnosis includes  Considered aortic dissection however patient is hemodynamically stable, sitting comfortably with symmetric pulses, no neurodeficits, no mediastinal widening on x-ray and no intimal flap noted on POCUS.  His troponins are mildly elevated, but flat.  No ischemic changes on EKG.  Do not suspect ACS.   Additional history obtained: Records  reviewed Care Everywhere/External Records  Disposition:   Sign out  given to Erin PA,C . Please see her note for the remainder of the visit.  Disposition pending workup  Social Determinants of Health:   none  This note was dictated with voice recognition software.  Despite best efforts at proofreading, errors may have occurred which can change the documentation meaning.       Final diagnoses:  Acute right-sided thoracic back pain    ED Discharge Orders     None          Donnajean Lynwood DEL, PA-C 04/08/24 1504    Elnor Savant A, DO 04/11/24 [phone]

## 2024-04-08 NOTE — ED Triage Notes (Signed)
 Patient reports back pain starting Thursday. He states it has been getting worse. He thought at first it was pulled muscle or gas. He states he has had no falls or injuries. He states it goes from upper back through to is chest. Worse on the right side.

## 2024-04-08 NOTE — ED Provider Notes (Signed)
 [place] EMERGENCY DEPARTMENT AT [facility] Provider Note   CSN: [id number] Arrival date & time: 04/08/24  9165     Patient presents with: Back Pain   Troy Palmer is a 78 y.o. male.    Back Pain   78 year old male presents emergency department with complaints of pain beneath his left shoulder blade.  States that he has been having pain for the past week or so.  Initially noticed pain upon awakening on Thursday of last week.  States the pain is worsened with movement relieved with rest and when he is lying flat.  Currently complaining of no pain.  States that the pain will sometimes radiate to his chest and sometimes down his low back.  States he initially thought it may have been muscular or related to gas took Gas-X without improvement of symptoms.  Given persistence, prompted visit to the emergency department.  States that he is currently not having symptoms lying in the bed but if he were to move, it would elicit pain.  States he did have similar symptoms around a year ago but he does not remember what medicine specifically he took over the results of his visit at that time.  Past medical history significant for OSA, PVD, GERD, BPH, polymyalgia rheumatica, hypertension, CKD, allergic angioedema, cough variant asthma, peripheral neuropathy, CHF  Prior to Admission medications   Medication Sig Start Date End Date Taking? Authorizing Provider  acetaminophen  (TYLENOL ) 650 MG CR tablet Take 1 tablet (650 mg total) by mouth every 6 (six) hours as needed (back pain). 03/02/21   Nivia Colon, PA-C  allopurinol  (ZYLOPRIM ) 100 MG tablet TAKE 1 TABLET(100 MG) BY MOUTH DAILY 09/18/23   Norleen Lynwood ORN, MD  amLODipine  (NORVASC ) 5 MG tablet TAKE 1 TABLET BY MOUTH EVERY DAY 02/24/24   Norleen Lynwood ORN, MD  atorvastatin  (LIPITOR) 80 MG tablet TAKE 1 TABLET(80 MG) BY MOUTH DAILY 11/22/23   Norleen Lynwood ORN, MD  augmented betamethasone  dipropionate (DIPROLENE -AF) 0.05 % cream Apply topically. 10/09/23    [provider]  Cholecalciferol  50 MCG (2000 UT) TABS 1 tab by mouth once daily 10/04/21   John, James W, MD  dicyclomine  (BENTYL ) 10 MG capsule TAKE 1 CAPSULE(10 MG) BY MOUTH FOUR TIMES DAILY BEFORE MEALS AND AT BEDTIME 05/20/23   Norleen Lynwood ORN, MD  furosemide  (LASIX ) 40 MG tablet TAKE 1&1/2 TABLET TWICE DAILY 02/24/24   Norleen Lynwood ORN, MD  metoprolol  succinate (TOPROL -XL) 50 MG 24 hr tablet TAKE 1 TABLET(50 MG) BY MOUTH DAILY WITH OR IMMEDIATELY FOLLOWING A MEAL 08/19/23   Norleen Lynwood ORN, MD  pantoprazole  (PROTONIX ) 40 MG tablet TAKE 1 TABLET(40 MG) BY MOUTH DAILY 02/13/24   Norleen Lynwood ORN, MD  tamsulosin  (FLOMAX ) 0.4 MG CAPS capsule TAKE 1 CAPSULE BY MOUTH TWICE A DAY 02/24/24   Norleen Lynwood ORN, MD  traMADol  (ULTRAM ) 50 MG tablet Take 1 tablet (50 mg total) by mouth every 6 (six) hours as needed. 10/25/23   Norleen Lynwood ORN, MD  vitamin B-12 (CYANOCOBALAMIN ) 1000 MCG tablet Take 1 tablet (1,000 mcg total) by mouth daily. 12/03/19   Norleen Lynwood ORN, MD    Allergies: Ace inhibitors, Amoxicillin , Augmentin  [amoxicillin -pot clavulanate], Doxycycline , Prednisone , and Sulfa  antibiotics    Review of Systems  Musculoskeletal:  Positive for back pain.  All other systems reviewed and are negative.   Updated Vital Signs BP 123/67 (BP Location: Right Arm)   Pulse 64   Temp 97.8 F (36.6 C) (Oral)   Resp 18  Ht 5' 7.5 (1.715 m)   Wt 111.1 kg   SpO2 100%   BMI 37.81 kg/m   Physical Exam Vitals and nursing note reviewed.  Constitutional:      General: He is not in acute distress.    Appearance: He is well-developed.  HENT:     Head: Normocephalic and atraumatic.  Eyes:     Conjunctiva/sclera: Conjunctivae normal.  Cardiovascular:     Rate and Rhythm: Normal rate and regular rhythm.  Pulmonary:     Effort: Pulmonary effort is normal. No respiratory distress.     Breath sounds: Normal breath sounds. No wheezing, rhonchi or rales.  Abdominal:     Palpations: Abdomen is soft.     Tenderness:  There is no abdominal tenderness.  Musculoskeletal:        General: No swelling.     Cervical back: Neck supple.     Right lower leg: No edema.     Left lower leg: No edema.     Comments: Paraspinal tenderness noted just medial to inferior aspect of scapula as well as inferior to scapula itself.  Patient does report worsening pain when he was sitting up in the bed but when sat still, experience no pain.  Skin:    General: Skin is warm and dry.     Capillary Refill: Capillary refill takes less than 2 seconds.  Neurological:     Mental Status: He is alert.  Psychiatric:        Mood and Affect: Mood normal.     (all labs ordered are listed, but only abnormal results are displayed) Labs Reviewed  CBC - Abnormal; Notable for the following components:      Result Value   WBC 3.8 (*)    RBC 3.97 (*)    Hemoglobin 10.8 (*)    HCT 33.3 (*)    All other components within normal limits  COMPREHENSIVE METABOLIC PANEL WITH GFR  D-DIMER, QUANTITATIVE  LIPASE, BLOOD  TROPONIN T, HIGH SENSITIVITY    EKG: EKG Interpretation Date/Time:  Wednesday April 08 2024 08:44:30 EDT Ventricular Rate:  58 PR Interval:  192 QRS Duration:  90 QT Interval:  398 QTC Calculation: 390 R Axis:   30  Text Interpretation: Sinus bradycardia Early repolarization Otherwise normal ECG When compared with ECG of 15-Mar-2023 04:39, Criteria for Anterior infarct are no longer Present Confirmed by Troy Palmer [phone]) on 04/08/2024 8:45:50 AM  Radiology: No results found.   Procedures   Medications Ordered in the ED - No data to display                                  Medical Decision Making Amount and/or Complexity of Data Reviewed Labs: ordered. Radiology: ordered.  Risk Prescription drug management.   This patient presents to the ED for concern of back pain, this involves an extensive number of treatment options, and is a complaint that carries with it a high risk of complications and  morbidity.  The differential diagnosis includes ACS, PE, pneumothorax, aortic dissection, MSK, GERD, pancreatitis, malignancy, other   Co morbidities that complicate the patient evaluation  See HPI   Additional history obtained:  Additional history obtained from EMR External records from outside source obtained and reviewed including hospital records   Lab Tests:  I Ordered, and personally interpreted labs.  The pertinent results include: Leukopenia white blood cell count of 3.8 which is slightly low from  baseline last year had labs with a white blood cell count of 4.0.  Anemia hemoglobin of 10.8 normocytic and near baseline as well.  Platelets within normal range.  D-dimer elevated 2.27.  Initial troponin of 23 with repeat pending.    Imaging Studies ordered:  I ordered imaging studies including chest x-ray,, VQ scan I independently visualized and interpreted imaging which showed  Chest x-ray: No acute cardiopulmonary abnormalities VQ scan: Pending I agree with the radiologist interpretation   Cardiac Monitoring: / EKG:  The patient was maintained on a cardiac monitor.  I personally viewed and interpreted the cardiac monitored which showed an underlying rhythm of: Sinus bradycardia.  Early repolarization.   Consultations Obtained:  I requested consultation with attending Dr. Ruthe who was in agreement treatment plan going forward   Problem List / ED Course / Critical interventions / Medication management  Back pain I ordered medication including fentanyl    Reevaluation of the patient after these medicines showed that the patient improved I have reviewed the patients home medicines and have made adjustments as needed   Social Determinants of Health:  Former cigarette use.  Denies illicit drug use.   Test / Admission - Considered:  Back pain Vitals signs within normal range and stable throughout visit. Laboratory/imaging studies significant for: See  above 78 year old male presents emergency department with complaints of pain beneath his left shoulder blade.  States that he has been having pain for the past week or so.  Initially noticed pain upon awakening on Thursday of last week.  States the pain is worsened with movement relieved with rest and when he is lying flat.  States that the pain will sometimes radiate to his chest with certain movements and is also sometimes worsened with taking a deep breath.  States he initially thought it may have been muscular or related to gas took Gas-X without improvement of symptoms.  Given persistence, prompted visit to the emergency department.  States that he is currently not having symptoms lying in the bed but if he were to move, it would elicit pain.  States he did have similar symptoms around a year ago but he does not remember what medicine specifically he took over the results of his visit at that time. On exam initially, slight reproducible tenderness paraspinally thoracic region as above.  Abdomen nontender.  Lungs clear to auscultation bilaterally.  Lab test showed patient with baseline renal function, slightly elevated troponin of 23 could be secondary to renal function with second 1 pending.  D-dimer elevated of 2.27.  D-dimer was obtained given patient's initial pleuritic type pain with some some description of shortness of breath.  Reassessment of the patient after lab and imaging studies had resulted showed clearly more reproducible tenderness paraspinallyin thoracic region.  Patient now states that he does not feel any acute shortness of breath and has felt the same for the past 15 years.  Now stating that he thinks that is more muscular.  In the setting of elevated D-dimer, with slight pleuritic nature of back pain, PE rule out deemed necessary. Consulted attending Dr. Ruthe who recommended transport to [facility] or [facility] for nuclear medicine study.  I will lower suspicion for aortic dissection given  patient without chest pain rated back and pulse deficits, neurodeficits, hypertension with duration of symptoms.  [facility] prefers [facility].  Will begin transfer process.      Final diagnoses:  None    ED Discharge Orders     None  Silver Wonda LABOR, [place] 04/08/24 1115    Troy Cornet, DO 04/08/24 1202

## 2024-04-08 NOTE — ED Notes (Signed)
 Pt received from [facility]. A&Ox4. Denies pain at this time.

## 2024-04-08 NOTE — ED Notes (Signed)
 Called [facility] at [facility] for transport

## 2024-04-08 NOTE — Discharge Instructions (Addendum)
 Continue tramadol , 50 mg every 6hrs as needed for pain.  Follow-up with your primary care provider in regard to your back pain as well as your abnormal lab results. Return to the emergency department if your symptoms worsen or if you develop chest pain or shortness of breath.

## 2024-04-08 NOTE — ED Notes (Signed)
[facility] called 

## 2024-04-08 NOTE — ED Provider Notes (Signed)
 Physical Exam   Vitals:   04/08/24 0843 04/08/24 1115 04/08/24 1258 04/08/24 1609  BP:  (!) 148/71 (!) 158/68 (!) 189/62  Pulse:  61 (!) 55 (!) 51  Resp:  17 18 18   Temp:   97.8 F (36.6 C) (!) 97.3 F (36.3 C)  TempSrc:   Oral Oral  SpO2:  99% 99% 99%  Weight: 111.1 kg     Height: 5' 7.5 (1.715 m)        Physical Exam  Procedures  Procedures  ED Course / MDM   Clinical Course as of 04/08/24 1517  Wed Apr 08, 2024  1321 Patient evaluated with complaints of exertional back pain that radiates to his chest x 4 days.  He is hemodynamically stable.  He does have reproducible tenderness to right sided thoracic paraspinal region.  No significant unilateral swelling or tenderness in his lower extremities.  His lung sounds are clear.  Referred here for perfusion study in the setting of elevated dimer and mildly elevated but flat troponins with CKD. [JT]  1323 Comprehensive metabolic panel(!) Creatinine elevated at 2.46, at baseline [JT]  1323 CBC(!) No leukocytosis, hemoglobin stable [JT]  1323 D-dimer, quantitative(!) Elevated 2.27 [JT]  1323 Lipase, blood Within normal limits [JT]  1323 Troponin T, High Sensitivity(!) Elevated to 23, flat [JT]  1325 EKG 12-Lead Sinus bradycardia, early repolarization [JT]  1326 DG Chest Port 1 View No acute findings [JT]  1401 Called nuclear medicine.  Confirmed study will be able to be performed here in the ED. [JT]    Clinical Course User Index [JT] Donnajean Lynwood DEL, PA-C   Medical Decision Making Amount and/or Complexity of Data Reviewed Labs: ordered. Decision-making details documented in ED Course. Radiology: ordered. Decision-making details documented in ED Course. ECG/medicine tests:  Decision-making details documented in ED Course.  Risk Prescription drug management.   Patient handed-off to me at shift change from previous PA-C Katrinka Donnajean, please see their note for initial history, physical exam findings, lab  interpretations, medication management and initial assessment/plan.  Patient transferred from [facility] for PE rule out, unable to undergo CTA PE study due to poor renal function. Pending VQ study.  NM pulmonary perfusion: Normal perfusion study. Please note this exam is not dedicated for evaluation of AVM. Dedicated perfusion scan with whole-body image is mandatory for evaluation of right to left shunt. Brain is not included in current field of few. Right-to-left shunt can also be quantified on whole-body scans, if indicated.  BNP 499, no baseline for comparison but I suspect this is likely elevated in the setting of CKD.  At time of my re-assessment, patient tells me his right-sided back pain persists but is improved, stating I am hungry and ready to go home.  He is well-appearing and in no acute distress, sitting upright on the edge of the bed watching TV.  He endorses some mild TTP of his right parathoracic muscles, lidocaine  patch in place. No midline C-spine/T-spine/L-spine TTP. I discussed the results of his labs/pulmonary perfusion study in depth with the patient, there is no evidence of pulmonary embolism noted on this scan but patient understands that he needs to return to the emergency department if he develops chest pain or shortness of breath.  Patient is on tramadol  for management of his pain, and he reports that this typically works well for his back pain and I advised him that according to his primary care provider he can take this medication up to 4 times daily (Q6hrs) as  needed for pain, I advised him to discuss his symptoms with his primary care provider if his pain persist.  I offered prescription for lidocaine  patches, he declines. Return precautions discussed, he voiced understanding and is in agreement with this plan. He is appropriate for discharge at this time.        Glendia Rocky SAILOR, [place] 04/08/24 1659    Ula Prentice SAUNDERS, MD 04/08/24 [phone]

## 2024-04-08 NOTE — ED Notes (Signed)
Pt is not in the room 

## 2024-04-12 ENCOUNTER — Emergency Department (HOSPITAL_BASED_OUTPATIENT_CLINIC_OR_DEPARTMENT_OTHER)
Admission: EM | Admit: 2024-04-12 | Discharge: 2024-04-13 | Disposition: A | Discharge status: Home / Self Care | Attending: Emergency Medicine | Admitting: Emergency Medicine

## 2024-04-12 ENCOUNTER — Emergency Department (HOSPITAL_BASED_OUTPATIENT_CLINIC_OR_DEPARTMENT_OTHER)

## 2024-04-12 ENCOUNTER — Other Ambulatory Visit: Payer: Self-pay

## 2024-04-12 ENCOUNTER — Encounter (HOSPITAL_BASED_OUTPATIENT_CLINIC_OR_DEPARTMENT_OTHER): Payer: Self-pay

## 2024-04-12 DIAGNOSIS — K449 Diaphragmatic hernia without obstruction or gangrene: Secondary | ICD-10-CM | POA: Diagnosis not present

## 2024-04-12 DIAGNOSIS — Z8673 Personal history of transient ischemic attack (TIA), and cerebral infarction without residual deficits: Secondary | ICD-10-CM | POA: Diagnosis not present

## 2024-04-12 DIAGNOSIS — R1013 Epigastric pain: Secondary | ICD-10-CM | POA: Insufficient documentation

## 2024-04-12 DIAGNOSIS — Z79899 Other long term (current) drug therapy: Secondary | ICD-10-CM | POA: Insufficient documentation

## 2024-04-12 DIAGNOSIS — R109 Unspecified abdominal pain: Secondary | ICD-10-CM | POA: Diagnosis not present

## 2024-04-12 DIAGNOSIS — J45909 Unspecified asthma, uncomplicated: Secondary | ICD-10-CM | POA: Diagnosis not present

## 2024-04-12 DIAGNOSIS — I10 Essential (primary) hypertension: Secondary | ICD-10-CM | POA: Diagnosis not present

## 2024-04-12 DIAGNOSIS — K8020 Calculus of gallbladder without cholecystitis without obstruction: Secondary | ICD-10-CM | POA: Diagnosis not present

## 2024-04-12 LAB — CBC
HCT: 36.5 % — ABNORMAL LOW (ref 39.0–52.0)
Hemoglobin: 11.8 g/dL — ABNORMAL LOW (ref 13.0–17.0)
MCH: 27 pg (ref 26.0–34.0)
MCHC: 32.3 g/dL (ref 30.0–36.0)
MCV: 83.5 fL (ref 80.0–100.0)
Platelets: 207 K/uL (ref 150–400)
RBC: 4.37 MIL/uL (ref 4.22–5.81)
RDW: 14.6 % (ref 11.5–15.5)
WBC: 3.7 K/uL — ABNORMAL LOW (ref 4.0–10.5)
nRBC: 0 % (ref 0.0–0.2)

## 2024-04-12 LAB — COMPREHENSIVE METABOLIC PANEL WITH GFR
ALT: 11 U/L (ref 0–44)
AST: 24 U/L (ref 15–41)
Albumin: 3.7 g/dL (ref 3.5–5.0)
Alkaline Phosphatase: 134 U/L — ABNORMAL HIGH (ref 38–126)
Anion gap: 11 (ref 5–15)
BUN: 35 mg/dL — ABNORMAL HIGH (ref 8–23)
CO2: 25 mmol/L (ref 22–32)
Calcium: 9.5 mg/dL (ref 8.9–10.3)
Chloride: 106 mmol/L (ref 98–111)
Creatinine, Ser: 2.78 mg/dL — ABNORMAL HIGH (ref 0.61–1.24)
GFR, Estimated: 23 mL/min — ABNORMAL LOW (ref >60)
Glucose, Bld: 113 mg/dL — ABNORMAL HIGH (ref 70–99)
Potassium: 5 mmol/L (ref 3.5–5.1)
Sodium: 142 mmol/L (ref 135–145)
Total Bilirubin: 0.4 mg/dL (ref 0.0–1.2)
Total Protein: 7 g/dL (ref 6.5–8.1)

## 2024-04-12 LAB — URINALYSIS, ROUTINE W REFLEX MICROSCOPIC
Bilirubin Urine: NEGATIVE
Glucose, UA: NEGATIVE mg/dL
Hgb urine dipstick: NEGATIVE
Ketones, ur: NEGATIVE mg/dL
Leukocytes,Ua: NEGATIVE
Nitrite: NEGATIVE
Protein, ur: NEGATIVE mg/dL
Specific Gravity, Urine: 1.011 (ref 1.005–1.030)
pH: 5.5 (ref 5.0–8.0)

## 2024-04-12 LAB — LIPASE, BLOOD: Lipase: 55 U/L — ABNORMAL HIGH (ref 11–51)

## 2024-04-12 MED ORDER — ALUM & MAG HYDROXIDE-SIMETH 200-200-20 MG/5ML PO SUSP
30.0000 mL | Freq: Once | ORAL | Status: AC
  Administered 2024-04-12: 30 mL via ORAL
  Filled 2024-04-12: qty 30

## 2024-04-12 MED ORDER — ONDANSETRON HCL 4 MG/2ML IJ SOLN
4.0000 mg | Freq: Once | INTRAMUSCULAR | Status: AC
Start: 2024-04-12 — End: 2024-04-12
  Administered 2024-04-12: 4 mg via INTRAVENOUS
  Filled 2024-04-12: qty 2

## 2024-04-12 MED ORDER — SODIUM CHLORIDE 0.9 % IV BOLUS
500.0000 mL | Freq: Once | INTRAVENOUS | Status: AC
  Administered 2024-04-12: 500 mL via INTRAVENOUS

## 2024-04-12 MED ORDER — FENTANYL CITRATE PF 50 MCG/ML IJ SOSY
50.0000 ug | PREFILLED_SYRINGE | Freq: Once | INTRAMUSCULAR | Status: AC
  Administered 2024-04-12: 50 ug via INTRAVENOUS
  Filled 2024-04-12: qty 1

## 2024-04-12 MED ORDER — MORPHINE SULFATE (PF) 4 MG/ML IV SOLN
4.0000 mg | Freq: Once | INTRAVENOUS | Status: AC
  Administered 2024-04-12: 4 mg via INTRAVENOUS
  Filled 2024-04-12: qty 1

## 2024-04-12 NOTE — ED Provider Notes (Signed)
 [place] EMERGENCY DEPARTMENT AT [facility] Provider Note   CSN: [id number] Arrival date & time: 04/12/24  1956     Patient presents with: Abdominal Pain   Troy Palmer is a 78 y.o. male.   Patient is a 78 year old male with a history of chronic renal disease, polymyalgia rheumatica, prior CVA, back pain who presents with abdominal pain.  He said that he took some tramadol  for back pain that he has been having and ate some raisin bran.  Shortly after he had some vomiting.  He also had some diarrhea.  No hematemesis.  No blood in his stool.  He started having some pain across his upper abdomen which he still has.  He denies any fevers.  No urinary symptoms.  He was seen here recently for back pain.  He says is not uncommon for him.  He did not have any back pain this morning but now complains of some recurrence of the back pain.  He does take tramadol  at home intermittently for pain.  He denies any prior abdominal surgeries.  No alcohol use.       Prior to Admission medications   Medication Sig Start Date End Date Taking? Authorizing Provider  acetaminophen  (TYLENOL ) 650 MG CR tablet Take 1 tablet (650 mg total) by mouth every 6 (six) hours as needed (back pain). 03/02/21   Nivia Colon, PA-C  allopurinol  (ZYLOPRIM ) 100 MG tablet TAKE 1 TABLET(100 MG) BY MOUTH DAILY 09/18/23   Norleen Lynwood ORN, MD  amLODipine  (NORVASC ) 5 MG tablet TAKE 1 TABLET BY MOUTH EVERY DAY 02/24/24   Norleen Lynwood ORN, MD  atorvastatin  (LIPITOR) 80 MG tablet TAKE 1 TABLET(80 MG) BY MOUTH DAILY 11/22/23   Norleen Lynwood ORN, MD  augmented betamethasone  dipropionate (DIPROLENE -AF) 0.05 % cream Apply topically. 10/09/23   [provider]  Cholecalciferol  50 MCG (2000 UT) TABS 1 tab by mouth once daily 10/04/21   Norleen Lynwood ORN, MD  dicyclomine  (BENTYL ) 10 MG capsule TAKE 1 CAPSULE(10 MG) BY MOUTH FOUR TIMES DAILY BEFORE MEALS AND AT BEDTIME 05/20/23   Norleen Lynwood ORN, MD  furosemide  (LASIX ) 40 MG tablet TAKE 1&1/2  TABLET TWICE DAILY 02/24/24   Norleen Lynwood ORN, MD  metoprolol  succinate (TOPROL -XL) 50 MG 24 hr tablet TAKE 1 TABLET(50 MG) BY MOUTH DAILY WITH OR IMMEDIATELY FOLLOWING A MEAL 08/19/23   Norleen Lynwood ORN, MD  pantoprazole  (PROTONIX ) 40 MG tablet TAKE 1 TABLET(40 MG) BY MOUTH DAILY 02/13/24   Norleen Lynwood ORN, MD  tamsulosin  (FLOMAX ) 0.4 MG CAPS capsule TAKE 1 CAPSULE BY MOUTH TWICE A DAY 02/24/24   Norleen Lynwood ORN, MD  traMADol  (ULTRAM ) 50 MG tablet Take 1 tablet (50 mg total) by mouth every 6 (six) hours as needed. 10/25/23   Norleen Lynwood ORN, MD  vitamin B-12 (CYANOCOBALAMIN ) 1000 MCG tablet Take 1 tablet (1,000 mcg total) by mouth daily. 12/03/19   Norleen Lynwood ORN, MD    Allergies: Ace inhibitors, Amoxicillin , Augmentin  [amoxicillin -pot clavulanate], Doxycycline , Prednisone , and Sulfa  antibiotics    Review of Systems  Constitutional:  Negative for chills, diaphoresis, fatigue and fever.  HENT:  Negative for congestion, rhinorrhea and sneezing.   Eyes: Negative.   Respiratory:  Negative for cough, chest tightness and shortness of breath.   Cardiovascular:  Negative for chest pain and leg swelling.  Gastrointestinal:  Positive for abdominal pain, diarrhea, nausea and vomiting. Negative for blood in stool.  Genitourinary:  Negative for difficulty urinating, flank pain, frequency and hematuria.  Musculoskeletal:  Positive for back pain. Negative for arthralgias.  Skin:  Negative for rash.  Neurological:  Negative for dizziness, speech difficulty, weakness, numbness and headaches.    Updated Vital Signs BP (!) 154/61   Pulse (!) 46   Temp 97.6 F (36.4 C) (Oral)   Resp 14   SpO2 98%   Physical Exam Constitutional:      Appearance: He is well-developed.  HENT:     Head: Normocephalic and atraumatic.  Eyes:     Pupils: Pupils are equal, round, and reactive to light.  Cardiovascular:     Rate and Rhythm: Normal rate and regular rhythm.     Heart sounds: Normal heart sounds.  Pulmonary:     Effort:  Pulmonary effort is normal. No respiratory distress.     Breath sounds: Normal breath sounds. No wheezing or rales.  Chest:     Chest wall: No tenderness.  Abdominal:     General: Bowel sounds are normal.     Palpations: Abdomen is soft.     Tenderness: There is abdominal tenderness in the epigastric area. There is no guarding or rebound.  Musculoskeletal:        General: Normal range of motion.     Cervical back: Normal range of motion and neck supple.  Lymphadenopathy:     Cervical: No cervical adenopathy.  Skin:    General: Skin is warm and dry.     Findings: No rash.  Neurological:     Mental Status: He is alert and oriented to person, place, and time.     (all labs ordered are listed, but only abnormal results are displayed) Labs Reviewed  LIPASE, BLOOD - Abnormal; Notable for the following components:      Result Value   Lipase 55 (*)    All other components within normal limits  COMPREHENSIVE METABOLIC PANEL WITH GFR - Abnormal; Notable for the following components:   Glucose, Bld 113 (*)    BUN 35 (*)    Creatinine, Ser 2.78 (*)    Alkaline Phosphatase 134 (*)    GFR, Estimated 23 (*)    All other components within normal limits  CBC - Abnormal; Notable for the following components:   WBC 3.7 (*)    Hemoglobin 11.8 (*)    HCT 36.5 (*)    All other components within normal limits  URINALYSIS, ROUTINE W REFLEX MICROSCOPIC    EKG: EKG Interpretation Date/Time:  Sunday April 12 2024 22:25:04 EDT Ventricular Rate:  59 PR Interval:  214 QRS Duration:  95 QT Interval:  432 QTC Calculation: 428 R Axis:   54  Text Interpretation: Sinus rhythm Borderline prolonged PR interval Minimal ST elevation, inferior leads Confirmed by Lenor Hollering [phone]) on 04/12/2024 10:27:56 PM  Radiology: CT ABDOMEN PELVIS WO CONTRAST Result Date: 04/12/2024 CLINICAL DATA:  Abdominal pain, acute, nonlocalized EXAM: CT ABDOMEN AND PELVIS WITHOUT CONTRAST TECHNIQUE: Multidetector CT  imaging of the abdomen and pelvis was performed following the standard protocol without IV contrast. RADIATION DOSE REDUCTION: This exam was performed according to the departmental dose-optimization program which includes automated exposure control, adjustment of the mA and/or kV according to patient size and/or use of iterative reconstruction technique. COMPARISON:  07/28/2018 FINDINGS: Lower chest: No acute abnormality.  Small hiatal hernia. Hepatobiliary: Cholelithiasis without superimposed pericholecystic inflammatory change. Liver unremarkable on this noncontrast examination. No intra or extrahepatic biliary ductal dilation. Pancreas: Unremarkable Spleen: Unremarkable Adrenals/Urinary Tract: The adrenal glands are unremarkable. The kidneys are normal in position. Severe left  a opinion and mild right renal cortical atrophy simple cortical cyst arises from the interpolar region of the right kidney for which no follow-up imaging is recommended. No hydronephrosis. No intrarenal or ureteral calculi. The bladder is decompressed. There is superimposed moderate perivesicular inflammatory stranding suggesting a superimposed infectious or inflammatory cystitis. No perivesicular fluid collections are seen. Stomach/Bowel: Stomach is within normal limits. Appendix appears normal. No evidence of bowel wall thickening, distention, or inflammatory changes. Vascular/Lymphatic: Aortic atherosclerosis. No enlarged abdominal or pelvic lymph nodes. Reproductive: Prostate is unremarkable. Other: No abdominal wall hernia or abnormality. No abdominopelvic ascites. Musculoskeletal: No acute or significant osseous findings. IMPRESSION: 1. Moderate perivesicular inflammatory stranding suggesting a superimposed infectious or inflammatory cystitis. Correlation with urinalysis and urine culture may be helpful. 2. Cholelithiasis. 3. Severe left and mild right renal cortical atrophy. 4. Aortic atherosclerosis. Aortic Atherosclerosis  (ICD10-I70.0). Electronically Signed   By: Dorethia Molt M.D.   On: 04/12/2024 22:06     Procedures   Medications Ordered in the ED  fentaNYL  (SUBLIMAZE ) injection 50 mcg (50 mcg Intravenous Given 04/12/24 2154)  sodium chloride  0.9 % bolus 500 mL (0 mLs Intravenous Stopped 04/12/24 2247)  ondansetron  (ZOFRAN ) injection 4 mg (4 mg Intravenous Given 04/12/24 2153)  alum & mag hydroxide-simeth (MAALOX/MYLANTA) 200-200-20 MG/5ML suspension 30 mL (30 mLs Oral Given 04/12/24 2316)                                    Medical Decision Making Amount and/or Complexity of Data Reviewed Labs: ordered. Radiology: ordered.  Risk OTC drugs. Prescription drug management.   This patient presents to the ED for concern of abdominal pain, this involves an extensive number of treatment options, and is a complaint that carries with it a high risk of complications and morbidity.  I considered the following differential and admission for this acute, potentially life threatening condition.  The differential diagnosis includes pancreatitis, cholecystitis, gallstones, bowel obstruction, gastritis, gastroenteritis  MDM:    Patient is a 78 year old who presents with abdominal pain after he ate a bowl of raisin bran and had some vomiting and diarrhea.  He has tenderness mostly in his epigastrium.  His labs show mildly elevated pancreas.  His LFTs are normal.  CT scan of his abdomen pelvis show gallstones but no evidence of cholecystitis.  No evidence of pancreatitis on CT scan.  He does not have an elevated white count.  No fever.  No pain is specifically over his gallbladder.  His pain seems to be more epigastric.  EKG does not show any ischemic changes.  Was given IV fluids and dose of pain medication.  This did not seem to help.  Was given GI cocktail.  Will try another dose of pain meds.  Care turned over to Dr. Bari pending reassessment after this.  (Labs, imaging, consults)  Labs: I Ordered, and personally  interpreted labs.  The pertinent results include: Minimally elevated pancreas, elevated creatinine but similar to prior values.  Imaging Studies ordered: I ordered imaging studies including CT abdomen pelvis I independently visualized and interpreted imaging. I agree with the radiologist interpretation  Additional history obtained from chart.  External records from outside source obtained and reviewed including prior notes  Cardiac Monitoring: The patient was maintained on a cardiac monitor.  If on the cardiac monitor, I personally viewed and interpreted the cardiac monitored which showed an underlying rhythm of: Sinus rhythm  Reevaluation: After  the interventions noted above, I reevaluated the patient and found that they have :improved  Social Determinants of Health:    Disposition:  pending  Co morbidities that complicate the patient evaluation  Past Medical History:  Diagnosis Date   ALLERGIC RHINITIS 04/09/2007   Allergy     ASTHMA 12/04/2007   BACK PAIN 09/16/2009   BENIGN PROSTATIC HYPERTROPHY 04/09/2007   CEREBROVASCULAR ACCIDENT, HX OF 07/17/2010   COLONIC POLYPS, HX OF 12/04/2007   CONSTIPATION 09/25/2010   Degeneration of cervical intervertebral disc 03/28/2007   DEPRESSION 12/04/2007   DIVERTICULOSIS, COLON 12/04/2007   DYSPHAGIA UNSPECIFIED 03/16/2008   ERECTILE DYSFUNCTION 04/09/2007   ESOPHAGEAL STRICTURE 05/26/2008   GERD 12/04/2007   Gout    HEMORRHOIDS, RECURRENT 02/11/2008   HYPERLIPIDEMIA 12/04/2007   HYPERTENSION 03/28/2007   Kidney disease    LEG PAIN, BILATERAL 01/25/2010   LOW BACK PAIN 04/09/2007   LUNG NODULE 12/04/2007   Osteoarth NOS-Unspec 03/28/2007   Other dysphagia 11/21/2009   Peripheral neuropathy 05/29/2018   POLYARTHRALGIA 07/13/2008   Polymyalgia rheumatica (HCC) 07/26/2008   PVD WITH CLAUDICATION 02/10/2010   RENAL INSUFFICIENCY 03/15/2010   Sleep apnea    no cpap    SLEEP APNEA, OBSTRUCTIVE, MODERATE 04/09/2007   Stroke  (HCC)    TIA    TB SKIN TEST, POSITIVE 03/28/2007     Medicines Meds ordered this encounter  Medications   fentaNYL  (SUBLIMAZE ) injection 50 mcg   sodium chloride  0.9 % bolus 500 mL   ondansetron  (ZOFRAN ) injection 4 mg   alum & mag hydroxide-simeth (MAALOX/MYLANTA) 200-200-20 MG/5ML suspension 30 mL    I have reviewed the patients home medicines and have made adjustments as needed  Problem List / ED Course: Problem List Items Addressed This Visit   None            Final diagnoses:  None    ED Discharge Orders     None          Lenor Hollering, MD 04/12/24 2326

## 2024-04-12 NOTE — Discharge Instructions (Addendum)
 Use a clear liquid diet for the next 24 hours.  Have close follow-up with your primary care doctor.  Return to the emergency room if you have any worsening symptoms.  Your kidney function needs to be followed closely by your doctor.

## 2024-04-12 NOTE — ED Triage Notes (Addendum)
 Pt c/o abd pain, I ate some raisin bran & took some tramadol  for back pain & now my stomach hurts. Just hurts like a stomach ache, but it's sharp & stabbing. Advises he was able to go to the bathroom but it didn't help. One episode of emesis, denies nausea at time of triage

## 2024-04-12 NOTE — ED Notes (Signed)
 Water given for PO challenge.

## 2024-04-13 NOTE — ED Provider Notes (Signed)
 Patient signed out pending reassessment after additional pain medication.  Workup largely reassuring.  Presented with epigastric pain.  Patient is able to tolerate p.o.  He is requesting discharge after additional dose of morphine .  Vital signs reviewed and reassuring.  Will discharge home per Dr. Gardner discharge instructions.   Troy Charmaine FALCON, MD 04/13/24 0111

## 2024-04-16 ENCOUNTER — Encounter: Payer: Self-pay | Admitting: Internal Medicine

## 2024-04-16 ENCOUNTER — Telehealth: Payer: Self-pay | Admitting: Internal Medicine

## 2024-04-16 ENCOUNTER — Ambulatory Visit: Admitting: Internal Medicine

## 2024-04-16 VITALS — BP 104/60 | HR 58 | Temp 97.7°F | Ht 67.5 in | Wt 235.6 lb

## 2024-04-16 DIAGNOSIS — Z7409 Other reduced mobility: Secondary | ICD-10-CM

## 2024-04-16 DIAGNOSIS — J309 Allergic rhinitis, unspecified: Secondary | ICD-10-CM | POA: Diagnosis not present

## 2024-04-16 DIAGNOSIS — E559 Vitamin D deficiency, unspecified: Secondary | ICD-10-CM | POA: Diagnosis not present

## 2024-04-16 DIAGNOSIS — N184 Chronic kidney disease, stage 4 (severe): Secondary | ICD-10-CM | POA: Diagnosis not present

## 2024-04-16 DIAGNOSIS — I10 Essential (primary) hypertension: Secondary | ICD-10-CM

## 2024-04-16 DIAGNOSIS — M546 Pain in thoracic spine: Secondary | ICD-10-CM | POA: Diagnosis not present

## 2024-04-16 MED ORDER — CYCLOBENZAPRINE HCL 5 MG PO TABS: 5.0000 mg | ORAL_TABLET | Freq: Three times a day (TID) | ORAL | 1 refills | Status: AC | PRN

## 2024-04-16 MED ORDER — METHYLPREDNISOLONE ACETATE 80 MG/ML IJ SUSP
80.0000 mg | Freq: Once | INTRAMUSCULAR | Status: AC
  Administered 2024-04-16: 80 mg via INTRAMUSCULAR

## 2024-04-16 NOTE — Assessment & Plan Note (Signed)
 Also may need PT eval pending MRI results

## 2024-04-16 NOTE — Progress Notes (Signed)
 Patient ID: Troy Palmer, male   DOB: [birth year], 78 y.o.   MRN: [id number]        Chief Complaint: follow up allergies, thoracic back pain, ckd4, low vit d       HPI:  Troy Palmer is a 78 y.o. male here with c/o 1 wk mod to severe nearly constant mid thoracic spine pain; was seen recently with abd pains with CT abd/pelvis without signfiicant acute, though did note gallstones and aortic atherosclerosis. No radicular pain.   Pt denies chest pain, increased sob or doe, wheezing, orthopnea, PND, increased LE swelling, palpitations, dizziness or syncope.   Pt denies polydipsia, polyuria, or new focal neuro s/s.   No recent falls.  Seeing renal regularly aug 15, felt to be stable per pt.  Also mentions with hx lumbar spinal stenosis, getting more difficult to get around with ADLs, and may need assist in the home soon, but family is not an option.Does have several wks ongoing nasal allergy  symptoms with clearish congestion, itch and sneezing, without fever, pain, ST, cough, swelling or wheezing. Wt Readings from Last 3 Encounters:  04/16/24 235 lb 9.6 oz (106.9 kg)  04/08/24 245 lb (111.1 kg)  01/27/24 241 lb (109.3 kg)   BP Readings from Last 3 Encounters:  04/16/24 104/60  04/13/24 (!) 153/56  04/08/24 (!) 189/62         Past Medical History:  Diagnosis Date   ALLERGIC RHINITIS 04/09/2007   Allergy     ASTHMA 12/04/2007   BACK PAIN 09/16/2009   BENIGN PROSTATIC HYPERTROPHY 04/09/2007   CEREBROVASCULAR ACCIDENT, HX OF 07/17/2010   COLONIC POLYPS, HX OF 12/04/2007   CONSTIPATION 09/25/2010   Degeneration of cervical intervertebral disc 03/28/2007   DEPRESSION 12/04/2007   DIVERTICULOSIS, COLON 12/04/2007   DYSPHAGIA UNSPECIFIED 03/16/2008   ERECTILE DYSFUNCTION 04/09/2007   ESOPHAGEAL STRICTURE 05/26/2008   GERD 12/04/2007   Gout    HEMORRHOIDS, RECURRENT 02/11/2008   HYPERLIPIDEMIA 12/04/2007   HYPERTENSION 03/28/2007   Kidney disease    LEG PAIN, BILATERAL 01/25/2010   LOW  BACK PAIN 04/09/2007   LUNG NODULE 12/04/2007   Osteoarth NOS-Unspec 03/28/2007   Other dysphagia 11/21/2009   Peripheral neuropathy 05/29/2018   POLYARTHRALGIA 07/13/2008   Polymyalgia rheumatica (HCC) 07/26/2008   PVD WITH CLAUDICATION 02/10/2010   RENAL INSUFFICIENCY 03/15/2010   Sleep apnea    no cpap    SLEEP APNEA, OBSTRUCTIVE, MODERATE 04/09/2007   Stroke (HCC)    TIA    TB SKIN TEST, POSITIVE 03/28/2007   Past Surgical History:  Procedure Laterality Date   COLONOSCOPY     KNEE ARTHROSCOPY Right    KNEE SURGERY Left    ROTATOR CUFF REPAIR Bilateral    TOTAL KNEE ARTHROPLASTY     x 2    reports that he has quit smoking. He has never used smokeless tobacco. He reports that he does not drink alcohol and does not use drugs. family history includes Asthma in an other family member; Healthy in his mother; Heart disease in his father; Hypertension in his brother; Lupus in his brother. Allergies  Allergen Reactions   Ace Inhibitors Swelling    Angioedema throat   Amoxicillin  Other (See Comments)   Augmentin  [Amoxicillin -Pot Clavulanate] Other (See Comments)    Marked weakness with po med x 3 doses   Doxycycline      Some GI upset   Prednisone  Other (See Comments)   Sulfa  Antibiotics Hives    And facial angioedema   Current Outpatient  Medications on File Prior to Visit  Medication Sig Dispense Refill   acetaminophen  (TYLENOL ) 650 MG CR tablet Take 1 tablet (650 mg total) by mouth every 6 (six) hours as needed (back pain). 30 tablet 0   allopurinol  (ZYLOPRIM ) 100 MG tablet TAKE 1 TABLET(100 MG) BY MOUTH DAILY 90 tablet 2   amLODipine  (NORVASC ) 5 MG tablet TAKE 1 TABLET BY MOUTH EVERY DAY 90 tablet 1   atorvastatin  (LIPITOR) 80 MG tablet TAKE 1 TABLET(80 MG) BY MOUTH DAILY 90 tablet 3   augmented betamethasone  dipropionate (DIPROLENE -AF) 0.05 % cream Apply topically.     Cholecalciferol  50 MCG (2000 UT) TABS 1 tab by mouth once daily 30 tablet 99   dicyclomine  (BENTYL ) 10  MG capsule TAKE 1 CAPSULE(10 MG) BY MOUTH FOUR TIMES DAILY BEFORE MEALS AND AT BEDTIME 90 capsule 1   furosemide  (LASIX ) 40 MG tablet TAKE 1&1/2 TABLET TWICE DAILY 270 tablet 2   metoprolol  succinate (TOPROL -XL) 50 MG 24 hr tablet TAKE 1 TABLET(50 MG) BY MOUTH DAILY WITH OR IMMEDIATELY FOLLOWING A MEAL 90 tablet 2   pantoprazole  (PROTONIX ) 40 MG tablet TAKE 1 TABLET(40 MG) BY MOUTH DAILY 90 tablet 3   tamsulosin  (FLOMAX ) 0.4 MG CAPS capsule TAKE 1 CAPSULE BY MOUTH TWICE A DAY 180 capsule 2   traMADol  (ULTRAM ) 50 MG tablet Take 1 tablet (50 mg total) by mouth every 6 (six) hours as needed. 60 tablet 2   vitamin B-12 (CYANOCOBALAMIN ) 1000 MCG tablet Take 1 tablet (1,000 mcg total) by mouth daily. 90 tablet 1   No current facility-administered medications on file prior to visit.        ROS:  All others reviewed and negative.  Objective        PE:  BP 104/60   Pulse (!) 58   Temp 97.7 F (36.5 C)   Ht 5' 7.5 (1.715 m)   Wt 235 lb 9.6 oz (106.9 kg)   SpO2 99%   BMI 36.36 kg/m                 Constitutional: Pt appears in NAD               HENT: Head: NCAT.                Right Ear: External ear normal.                 Left Ear: External ear normal. Bilat tm's with mild erythema.  Max sinus areas non tender.  Pharynx with mild erythema, no exudate               Eyes: . Pupils are equal, round, and reactive to light. Conjunctivae and EOM are normal               Nose: without d/c or deformity               Neck: Neck supple. Gross normal ROM               Cardiovascular: Normal rate and regular rhythm.                 Pulmonary/Chest: Effort normal and breath sounds without rales or wheezing.                Abd:  Soft, NT, ND, + BS, no organomegaly               Neurological: Pt is alert. At baseline orientation, motor grossly intact, cn  2-12 intact               Thoracic spine with mod tenderness mid thoracic without swelling or overlying skin change               Skin: Skin is warm.  No rashes, no other new lesions, LE edema - none               Psychiatric: Pt behavior is normal without agitation   Micro: none  Cardiac tracings I have personally interpreted today:  none  Pertinent Radiological findings (summarize): none   Lab Results  Component Value Date   WBC 3.7 (L) 04/12/2024   HGB 11.8 (L) 04/12/2024   HCT 36.5 (L) 04/12/2024   PLT 207 04/12/2024   GLUCOSE 113 (H) 04/12/2024   CHOL 145 10/29/2022   TRIG 69.0 10/29/2022   HDL 44.80 10/29/2022   LDLDIRECT 156.9 10/10/2010   LDLCALC 86 10/29/2022   ALT 11 04/12/2024   AST 24 04/12/2024   NA 142 04/12/2024   K 5.0 04/12/2024   CL 106 04/12/2024   CREATININE 2.78 (H) 04/12/2024   BUN 35 (H) 04/12/2024   CO2 25 04/12/2024   TSH 2.14 10/29/2022   PSA 1.09 10/29/2022   HGBA1C 5.6 03/18/2023   Assessment/Plan:  Troy Palmer is a 78 y.o. Black or African American [2] male with  has a past medical history of ALLERGIC RHINITIS (04/09/2007), Allergy , ASTHMA (12/04/2007), BACK PAIN (09/16/2009), BENIGN PROSTATIC HYPERTROPHY (04/09/2007), CEREBROVASCULAR ACCIDENT, HX OF (07/17/2010), COLONIC POLYPS, HX OF (12/04/2007), CONSTIPATION (09/25/2010), Degeneration of cervical intervertebral disc (03/28/2007), DEPRESSION (12/04/2007), DIVERTICULOSIS, COLON (12/04/2007), DYSPHAGIA UNSPECIFIED (03/16/2008), ERECTILE DYSFUNCTION (04/09/2007), ESOPHAGEAL STRICTURE (05/26/2008), GERD (12/04/2007), Gout, HEMORRHOIDS, RECURRENT (02/11/2008), HYPERLIPIDEMIA (12/04/2007), HYPERTENSION (03/28/2007), Kidney disease, LEG PAIN, BILATERAL (01/25/2010), LOW BACK PAIN (04/09/2007), LUNG NODULE (12/04/2007), Osteoarth NOS-Unspec (03/28/2007), Other dysphagia (11/21/2009), Peripheral neuropathy (05/29/2018), POLYARTHRALGIA (07/13/2008), Polymyalgia rheumatica (HCC) (07/26/2008), PVD WITH CLAUDICATION (02/10/2010), RENAL INSUFFICIENCY (03/15/2010), Sleep apnea, SLEEP APNEA, OBSTRUCTIVE, MODERATE (04/09/2007), Stroke (HCC), and TB SKIN TEST,  POSITIVE (03/28/2007).  Thoracic spine pain No recent falls, etiology unclear, plain films 2022 with moderate DDD - now for Thoracic MRI and consider ortho referral if abnormal, also trial flexeril  5 mg tid prn  Vitamin D  deficiency Last vitamin D  Lab Results  Component Value Date   VD25OH 30.68 10/29/2022   Low, to start oral replacement   CKD (chronic kidney disease) stage 4, GFR 15-29 ml/min (HCC) Lab Results  Component Value Date   CREATININE 2.78 (H) 04/12/2024   Stable overall, cont to avoid nephrotoxins, f/u renal as planned  Essential hypertension BP Readings from Last 3 Encounters:  04/16/24 104/60  04/13/24 (!) 153/56  04/08/24 (!) 189/62   Stable, pt to continue medical treatment norvasc  5 every day, toprol  xl 50 qd   Mobility impaired Also may need PT eval pending MRI results  Atopic rhinitis Mild to mod, for depomedrol 80 mg IM,  to f/u any worsening symptoms or concerns  Followup: Return in about 6 months (around 10/14/2024).  Lynwood Rush, MD 04/16/2024 10:11 AM [facility]

## 2024-04-16 NOTE — Telephone Encounter (Signed)
 Form left in box for Dr. Norleen to fill out for patient.

## 2024-04-16 NOTE — Assessment & Plan Note (Signed)
Mild to mod, for depomedrol 80 mg IM, to f/u any worsening symptoms or concerns 

## 2024-04-16 NOTE — Assessment & Plan Note (Signed)
Last vitamin D Lab Results  Component Value Date   VD25OH 30.68 10/29/2022   Low, to start oral replacement

## 2024-04-16 NOTE — Assessment & Plan Note (Signed)
 BP Readings from Last 3 Encounters:  04/16/24 104/60  04/13/24 (!) 153/56  04/08/24 (!) 189/62   Stable, pt to continue medical treatment norvasc  5 every day, toprol  xl 50 qd

## 2024-04-16 NOTE — Assessment & Plan Note (Signed)
 Lab Results  Component Value Date   CREATININE 2.78 (H) 04/12/2024   Stable overall, cont to avoid nephrotoxins, f/u renal as planned

## 2024-04-16 NOTE — Assessment & Plan Note (Signed)
 No recent falls, etiology unclear, plain films 2022 with moderate DDD - now for Thoracic MRI and consider ortho referral if abnormal, also trial flexeril  5 mg tid prn

## 2024-04-16 NOTE — Patient Instructions (Signed)
 You had the steroid shot today  Please take all new medication as prescribed - the muscle relaxer as needed  Please continue all other medications as before, and refills have been done if requested.  Please have the pharmacy call with any other refills you may need.  Please keep your appointments with your specialists as you may have planned  You will be contacted regarding the referral for: MRI for the thoracic spine  We can hold on lab testing today  Please make an Appointment to return in 6 months, or sooner if needed

## 2024-04-20 NOTE — Telephone Encounter (Signed)
 Form received and given to Dr.John for signature

## 2024-04-21 ENCOUNTER — Other Ambulatory Visit: Payer: Self-pay | Admitting: Internal Medicine

## 2024-05-01 ENCOUNTER — Telehealth: Payer: Self-pay

## 2024-05-01 NOTE — Telephone Encounter (Signed)
 Copied from CRM [id number]. Topic: Appointments - Scheduling Inquiry for Clinic >> May 01, 2024 12:12 PM Leah C wrote: Reason for CRM: Patient called in due to not receiving a call back to be scheduled for imaging ordered by Dr. Norleen.    [phone] (M)

## 2024-05-01 NOTE — Telephone Encounter (Signed)
 Copied from CRM [id number]. Topic: General - Call Back - No Documentation >> May 01, 2024  3:35 PM Shereese L wrote: Reason for RMF:Ejupzwu is returning office call and would like the office to give him a call back

## 2024-06-03 ENCOUNTER — Encounter: Admitting: Internal Medicine

## 2024-06-20 DIAGNOSIS — Z1211 Encounter for screening for malignant neoplasm of colon: Secondary | ICD-10-CM | POA: Diagnosis not present

## 2024-06-24 LAB — COLOGUARD: COLOGUARD: NEGATIVE

## 2024-06-25 ENCOUNTER — Ambulatory Visit: Payer: Self-pay | Admitting: Gastroenterology

## 2024-07-28 ENCOUNTER — Ambulatory Visit: Payer: Self-pay | Admitting: Internal Medicine

## 2024-07-28 ENCOUNTER — Ambulatory Visit

## 2024-07-28 ENCOUNTER — Encounter: Payer: Self-pay | Admitting: Internal Medicine

## 2024-07-28 ENCOUNTER — Ambulatory Visit: Admitting: Internal Medicine

## 2024-07-28 VITALS — BP 124/78 | HR 55 | Temp 97.7°F | Ht 67.5 in | Wt 235.0 lb

## 2024-07-28 DIAGNOSIS — R062 Wheezing: Secondary | ICD-10-CM

## 2024-07-28 DIAGNOSIS — E538 Deficiency of other specified B group vitamins: Secondary | ICD-10-CM

## 2024-07-28 DIAGNOSIS — R7302 Impaired glucose tolerance (oral): Secondary | ICD-10-CM | POA: Diagnosis not present

## 2024-07-28 DIAGNOSIS — R051 Acute cough: Secondary | ICD-10-CM

## 2024-07-28 DIAGNOSIS — M069 Rheumatoid arthritis, unspecified: Secondary | ICD-10-CM | POA: Insufficient documentation

## 2024-07-28 DIAGNOSIS — Z125 Encounter for screening for malignant neoplasm of prostate: Secondary | ICD-10-CM

## 2024-07-28 DIAGNOSIS — I10 Essential (primary) hypertension: Secondary | ICD-10-CM

## 2024-07-28 DIAGNOSIS — E559 Vitamin D deficiency, unspecified: Secondary | ICD-10-CM

## 2024-07-28 DIAGNOSIS — R7689 Other specified abnormal immunological findings in serum: Secondary | ICD-10-CM | POA: Insufficient documentation

## 2024-07-28 DIAGNOSIS — N184 Chronic kidney disease, stage 4 (severe): Secondary | ICD-10-CM

## 2024-07-28 DIAGNOSIS — E7800 Pure hypercholesterolemia, unspecified: Secondary | ICD-10-CM

## 2024-07-28 DIAGNOSIS — E66812 Obesity, class 2: Secondary | ICD-10-CM | POA: Insufficient documentation

## 2024-07-28 LAB — BASIC METABOLIC PANEL WITH GFR
BUN: 32 mg/dL — ABNORMAL HIGH (ref 6–23)
CO2: 30 meq/L (ref 19–32)
Calcium: 9.2 mg/dL (ref 8.4–10.5)
Chloride: 107 meq/L (ref 96–112)
Creatinine, Ser: 2.37 mg/dL — ABNORMAL HIGH (ref 0.40–1.50)
GFR: 25.68 mL/min — ABNORMAL LOW (ref >60.00)
Glucose, Bld: 79 mg/dL (ref 70–99)
Potassium: 5.2 meq/L — ABNORMAL HIGH (ref 3.5–5.1)
Sodium: 143 meq/L (ref 135–145)

## 2024-07-28 LAB — HEPATIC FUNCTION PANEL
ALT: 19 U/L (ref 3–53)
AST: 22 U/L (ref 5–37)
Albumin: 3.6 g/dL (ref 3.5–5.2)
Alkaline Phosphatase: 95 U/L (ref 39–117)
Bilirubin, Direct: 0.1 mg/dL (ref 0.1–0.3)
Total Bilirubin: 0.4 mg/dL (ref 0.2–1.2)
Total Protein: 6.4 g/dL (ref 6.0–8.3)

## 2024-07-28 LAB — LIPID PANEL
Cholesterol: 135 mg/dL (ref 28–200)
HDL: 45.9 mg/dL (ref >39.00)
LDL Cholesterol: 69 mg/dL (ref 10–99)
NonHDL: 89.07
Total CHOL/HDL Ratio: 3
Triglycerides: 98 mg/dL (ref 10.0–149.0)
VLDL: 19.6 mg/dL (ref 0.0–40.0)

## 2024-07-28 LAB — CBC WITH DIFFERENTIAL/PLATELET
Basophils Absolute: 0 K/uL (ref 0.0–0.1)
Basophils Relative: 0.5 % (ref 0.0–3.0)
Eosinophils Absolute: 0.2 K/uL (ref 0.0–0.7)
Eosinophils Relative: 4.2 % (ref 0.0–5.0)
HCT: 36.1 % — ABNORMAL LOW (ref 39.0–52.0)
Hemoglobin: 11.5 g/dL — ABNORMAL LOW (ref 13.0–17.0)
Lymphocytes Relative: 23.3 % (ref 12.0–46.0)
Lymphs Abs: 1.3 K/uL (ref 0.7–4.0)
MCHC: 31.9 g/dL (ref 30.0–36.0)
MCV: 85.9 fl (ref 78.0–100.0)
Monocytes Absolute: 0.7 K/uL (ref 0.1–1.0)
Monocytes Relative: 12.3 % — ABNORMAL HIGH (ref 3.0–12.0)
Neutro Abs: 3.4 K/uL (ref 1.4–7.7)
Neutrophils Relative %: 59.7 % (ref 43.0–77.0)
Platelets: 247 K/uL (ref 150.0–400.0)
RBC: 4.2 Mil/uL — ABNORMAL LOW (ref 4.22–5.81)
RDW: 14.9 % (ref 11.5–15.5)
WBC: 5.7 K/uL (ref 4.0–10.5)

## 2024-07-28 LAB — MICROALBUMIN / CREATININE URINE RATIO
Creatinine,U: 156.8 mg/dL
Microalb Creat Ratio: 57 mg/g — ABNORMAL HIGH (ref 0.0–30.0)
Microalb, Ur: 8.9 mg/dL — ABNORMAL HIGH (ref 0.7–1.9)

## 2024-07-28 LAB — URINALYSIS, ROUTINE W REFLEX MICROSCOPIC
Bilirubin Urine: NEGATIVE
Hgb urine dipstick: NEGATIVE
Ketones, ur: NEGATIVE
Leukocytes,Ua: NEGATIVE
Nitrite: NEGATIVE
RBC / HPF: NONE SEEN (ref 0–?)
Specific Gravity, Urine: 1.015 (ref 1.000–1.030)
Total Protein, Urine: NEGATIVE
Urine Glucose: NEGATIVE
Urobilinogen, UA: 0.2 (ref 0.0–1.0)
pH: 6 (ref 5.0–8.0)

## 2024-07-28 LAB — VITAMIN D 25 HYDROXY (VIT D DEFICIENCY, FRACTURES): VITD: 48.01 ng/mL (ref 30.00–100.00)

## 2024-07-28 LAB — TSH: TSH: 1.53 u[IU]/mL (ref 0.35–5.50)

## 2024-07-28 LAB — VITAMIN B12: Vitamin B-12: 298 pg/mL (ref 211–911)

## 2024-07-28 LAB — HEMOGLOBIN A1C: Hgb A1c MFr Bld: 5.7 % (ref 4.6–6.5)

## 2024-07-28 LAB — PSA: PSA: 1.27 ng/mL (ref 0.10–4.00)

## 2024-07-28 MED ORDER — AIRSUPRA 90-80 MCG/ACT IN AERO: 2.0000 | INHALATION_SPRAY | Freq: Four times a day (QID) | RESPIRATORY_TRACT | 5 refills | Status: AC | PRN

## 2024-07-28 MED ORDER — PREDNISONE 10 MG PO TABS: ORAL_TABLET | ORAL | 0 refills | Status: DC

## 2024-07-28 MED ORDER — AZITHROMYCIN 250 MG PO TABS: ORAL_TABLET | ORAL | 1 refills | Status: AC

## 2024-07-28 MED ORDER — HYDROCODONE BIT-HOMATROP MBR 5-1.5 MG/5ML PO SOLN: 5.0000 mL | Freq: Four times a day (QID) | ORAL | 0 refills | Status: AC | PRN

## 2024-07-28 NOTE — Progress Notes (Signed)
 Patient ID: Troy Palmer, male   DOB: August 04, 1946, 78 y.o.   MRN: [id number]        Chief Complaint: follow up low vit d and b12, hyperglycemia, hld, htn, ckd4, cough, wheezing       HPI:  Troy Palmer is a 78 y.o. male here overall doing ok, except Here with acute onset mild to mod 2-3 days ST, HA, general weakness and malaise, with prod cough greenish sputum with wheezing sob, but Pt denies chest pain, orthopnea, PND, increased LE swelling, palpitations, dizziness or syncope.   Pt denies polydipsia, polyuria, or new focal neuro s/s.    Pt denies fever, wt loss, night sweats, loss of appetite, or other constitutional symptoms   Had flu shot and covid shot last thur, and for prevnar later this wk.  Peak wt has been 315, now 235.  Does have several wks ongoing nasal allergy  symptoms with clearish congestion, itch and sneezing, without fever, pain, ST, cough, swelling or wheezing.  Has had increased bilat hand pain c/w RA per pt in the past weeks.   Wt Readings from Last 3 Encounters:  07/28/24 235 lb (106.6 kg)  04/16/24 235 lb 9.6 oz (106.9 kg)  04/08/24 245 lb (111.1 kg)   BP Readings from Last 3 Encounters:  07/28/24 124/78  04/16/24 104/60  04/13/24 (!) 153/56         Past Medical History:  Diagnosis Date   ALLERGIC RHINITIS 04/09/2007   Allergy     ASTHMA 12/04/2007   BACK PAIN 09/16/2009   BENIGN PROSTATIC HYPERTROPHY 04/09/2007   CEREBROVASCULAR ACCIDENT, HX OF 07/17/2010   COLONIC POLYPS, HX OF 12/04/2007   CONSTIPATION 09/25/2010   Degeneration of cervical intervertebral disc 03/28/2007   DEPRESSION 12/04/2007   DIVERTICULOSIS, COLON 12/04/2007   DYSPHAGIA UNSPECIFIED 03/16/2008   ERECTILE DYSFUNCTION 04/09/2007   ESOPHAGEAL STRICTURE 05/26/2008   GERD 12/04/2007   Gout    HEMORRHOIDS, RECURRENT 02/11/2008   HYPERLIPIDEMIA 12/04/2007   HYPERTENSION 03/28/2007   Kidney disease    LEG PAIN, BILATERAL 01/25/2010   LOW BACK PAIN 04/09/2007   LUNG NODULE 12/04/2007    Osteoarth NOS-Unspec 03/28/2007   Other dysphagia 11/21/2009   Peripheral neuropathy 05/29/2018   POLYARTHRALGIA 07/13/2008   Polymyalgia rheumatica 07/26/2008   PVD WITH CLAUDICATION 02/10/2010   RENAL INSUFFICIENCY 03/15/2010   Sleep apnea    no cpap    SLEEP APNEA, OBSTRUCTIVE, MODERATE 04/09/2007   Stroke (HCC)    TIA    TB SKIN TEST, POSITIVE 03/28/2007   Past Surgical History:  Procedure Laterality Date   COLONOSCOPY     KNEE ARTHROSCOPY Right    KNEE SURGERY Left    ROTATOR CUFF REPAIR Bilateral    TOTAL KNEE ARTHROPLASTY     x 2    reports that he has quit smoking. He has never used smokeless tobacco. He reports that he does not drink alcohol and does not use drugs. family history includes Asthma in an other family member; Healthy in his mother; Heart disease in his father; Hypertension in his brother; Lupus in his brother. Allergies[1] Medications Ordered Prior to Encounter[2]      ROS:  All others reviewed and negative.  Objective        PE:  BP 124/78 (BP Location: Right Arm, Patient Position: Sitting, Cuff Size: Normal)   Pulse (!) 55   Temp 97.7 F (36.5 C) (Oral)   Ht 5' 7.5 (1.715 m)   Wt 235 lb (106.6 kg)  SpO2 99%   BMI 36.26 kg/m                 Constitutional: Pt appears mild ill               HENT: Head: NCAT.                Right Ear: External ear normal.                 Left Ear: External ear normal. Bilat tm's with mild erythema.  Max sinus areas non tender.  Pharynx with mild erythema, no exudate               Eyes: . Pupils are equal, round, and reactive to light. Conjunctivae and EOM are normal               Nose: without d/c or deformity               Neck: Neck supple. Gross normal ROM               Cardiovascular: Normal rate and regular rhythm.                 Pulmonary/Chest: Effort normal and breath sounds without rales but few mild bilateral wheezing.                               Neurological: Pt is alert. At baseline  orientation, motor grossly intact               Skin: Skin is warm. No rashes, no other new lesions, LE edema - none               Psychiatric: Pt behavior is normal without agitation   Micro: none  Cardiac tracings I have personally interpreted today:  none  Pertinent Radiological findings (summarize): none   Lab Results  Component Value Date   WBC 3.7 (L) 04/12/2024   HGB 11.8 (L) 04/12/2024   HCT 36.5 (L) 04/12/2024   PLT 207 04/12/2024   GLUCOSE 113 (H) 04/12/2024   CHOL 145 10/29/2022   TRIG 69.0 10/29/2022   HDL 44.80 10/29/2022   LDLDIRECT 156.9 10/10/2010   LDLCALC 86 10/29/2022   ALT 11 04/12/2024   AST 24 04/12/2024   NA 142 04/12/2024   K 5.0 04/12/2024   CL 106 04/12/2024   CREATININE 2.78 (H) 04/12/2024   BUN 35 (H) 04/12/2024   CO2 25 04/12/2024   TSH 2.14 10/29/2022   PSA 1.09 10/29/2022   HGBA1C 5.6 03/18/2023   Assessment/Plan:  Troy Palmer is a 78 y.o. Black or African American [2] male with  has a past medical history of ALLERGIC RHINITIS (04/09/2007), Allergy , ASTHMA (12/04/2007), BACK PAIN (09/16/2009), BENIGN PROSTATIC HYPERTROPHY (04/09/2007), CEREBROVASCULAR ACCIDENT, HX OF (07/17/2010), COLONIC POLYPS, HX OF (12/04/2007), CONSTIPATION (09/25/2010), Degeneration of cervical intervertebral disc (03/28/2007), DEPRESSION (12/04/2007), DIVERTICULOSIS, COLON (12/04/2007), DYSPHAGIA UNSPECIFIED (03/16/2008), ERECTILE DYSFUNCTION (04/09/2007), ESOPHAGEAL STRICTURE (05/26/2008), GERD (12/04/2007), Gout, HEMORRHOIDS, RECURRENT (02/11/2008), HYPERLIPIDEMIA (12/04/2007), HYPERTENSION (03/28/2007), Kidney disease, LEG PAIN, BILATERAL (01/25/2010), LOW BACK PAIN (04/09/2007), LUNG NODULE (12/04/2007), Osteoarth NOS-Unspec (03/28/2007), Other dysphagia (11/21/2009), Peripheral neuropathy (05/29/2018), POLYARTHRALGIA (07/13/2008), Polymyalgia rheumatica (07/26/2008), PVD WITH CLAUDICATION (02/10/2010), RENAL INSUFFICIENCY (03/15/2010), Sleep apnea, SLEEP APNEA,  OBSTRUCTIVE, MODERATE (04/09/2007), Stroke (HCC), and TB SKIN TEST, POSITIVE (03/28/2007).  Rheumatoid arthritis (HCC) Also to improve with prendisone taper, f/u rheumatology  Vitamin D  deficiency Last vitamin D  Lab  Results  Component Value Date   VD25OH 30.68 10/29/2022   Low, to start oral replacement   B12 deficiency Lab Results  Component Value Date   VITAMINB12 481 10/29/2022   Stable, cont oral replacement - b12 1000 mcg qd   Cough Mild to mod,  for antibx course zpack, cough med prn,  for cxr today, cbc, to f/u any worsening symptoms or concerns  Impaired glucose tolerance Lab Results  Component Value Date   HGBA1C 5.6 03/18/2023   Stable, pt to continue current medical treatment  - diet , wt control and for lab f/u today   Hypercholesterolemia Lab Results  Component Value Date   LDLCALC 86 10/29/2022   uncontrolled, pt to continue current statin lipitor 80 mg every day and lower chol diet, delcines other change, for f/u lab today   Essential hypertension BP Readings from Last 3 Encounters:  07/28/24 124/78  04/16/24 104/60  04/13/24 (!) 153/56   Stable, pt to continue medical treatment norvasc  5 mg qd   CKD (chronic kidney disease) stage 4, GFR 15-29 ml/min (HCC) Lab Results  Component Value Date   CREATININE 2.78 (H) 04/12/2024   Stable overall, cont to avoid nephrotoxins, for f/u lab today f/u renal ckd4  Followup: Return in about 6 months (around 01/26/2025), or if symptoms worsen or fail to improve.  Lynwood Rush, MD 07/28/2024 10:50 AM [facility] [place] [facility] - [facility]     [1]  Allergies Allergen Reactions   Ace Inhibitors Swelling    Angioedema throat   Amoxicillin  Other (See Comments)   Augmentin  [Amoxicillin -Pot Clavulanate] Other (See Comments)    Marked weakness with po med x 3 doses   Doxycycline      Some GI upset   Prednisone  Other (See Comments)   Sulfa  Antibiotics Hives     And facial angioedema  [2]  Current Outpatient Medications on File Prior to Visit  Medication Sig Dispense Refill   acetaminophen  (TYLENOL ) 650 MG CR tablet Take 1 tablet (650 mg total) by mouth every 6 (six) hours as needed (back pain). 30 tablet 0   allopurinol  (ZYLOPRIM ) 100 MG tablet TAKE 1 TABLET BY MOUTH EVERY DAY 90 tablet 1   amLODipine  (NORVASC ) 5 MG tablet TAKE 1 TABLET BY MOUTH EVERY DAY 90 tablet 1   atorvastatin  (LIPITOR) 80 MG tablet TAKE 1 TABLET(80 MG) BY MOUTH DAILY 90 tablet 3   augmented betamethasone  dipropionate (DIPROLENE -AF) 0.05 % cream Apply topically.     Cholecalciferol  50 MCG (2000 UT) TABS 1 tab by mouth once daily 30 tablet 99   cyclobenzaprine  (FLEXERIL ) 5 MG tablet Take 1 tablet (5 mg total) by mouth 3 (three) times daily as needed. 40 tablet 1   dicyclomine  (BENTYL ) 10 MG capsule TAKE 1 CAPSULE(10 MG) BY MOUTH FOUR TIMES DAILY BEFORE MEALS AND AT BEDTIME 90 capsule 1   furosemide  (LASIX ) 40 MG tablet TAKE 1&1/2 TABLET TWICE DAILY 270 tablet 2   metoprolol  succinate (TOPROL -XL) 50 MG 24 hr tablet TAKE 1 TABLET BY MOUTH EVERY DAY WITH OR IMMEDIATELY FOLLOWING A MEAL 90 tablet 1   pantoprazole  (PROTONIX ) 40 MG tablet TAKE 1 TABLET(40 MG) BY MOUTH DAILY 90 tablet 3   tamsulosin  (FLOMAX ) 0.4 MG CAPS capsule TAKE 1 CAPSULE BY MOUTH TWICE A DAY 180 capsule 2   traMADol  (ULTRAM ) 50 MG tablet Take 1 tablet (50 mg total) by mouth every 6 (six) hours as needed. 60 tablet 2   vitamin B-12 (CYANOCOBALAMIN ) 1000  MCG tablet Take 1 tablet (1,000 mcg total) by mouth daily. 90 tablet 1   No current facility-administered medications on file prior to visit.

## 2024-07-28 NOTE — Assessment & Plan Note (Signed)
 Also to improve with prendisone taper, f/u rheumatology

## 2024-07-28 NOTE — Assessment & Plan Note (Addendum)
 Mild to mod,  for antibx course zpack, cough med prn,  for cxr today, cbc, to f/u any worsening symptoms or concerns

## 2024-07-28 NOTE — Patient Instructions (Signed)
 Please take all new medication as prescribed - the antibiotic, and cough medicine as needed  Please take all new medication as prescribed - also the prednisone , and inhaler as needed (which also helps allergies and rheumatoid arthritis while you take it)  Please continue all other medications as before, and refills have been done if requested.  Please have the pharmacy call with any other refills you may need.  Please continue your efforts at being more active, low cholesterol diet, and weight control.  Please keep your appointments with your specialists as you may have planned  Please go to the LAB at the blood drawing area for the tests to be done  You will be contacted by phone if any changes need to be made immediately.  Otherwise, you will receive a letter about your results with an explanation, but please check with MyChart first.  Please make an Appointment to return in 6 months, or sooner if needed

## 2024-07-28 NOTE — Assessment & Plan Note (Signed)
Lab Results  Component Value Date   [id number] 10/29/2022   Stable, cont oral replacement - b12 1000 mcg qd

## 2024-07-28 NOTE — Assessment & Plan Note (Signed)
 Lab Results  Component Value Date   CREATININE 2.78 (H) 04/12/2024   Stable overall, cont to avoid nephrotoxins, for f/u lab today f/u renal ckd4

## 2024-07-28 NOTE — Progress Notes (Signed)
 The test results show that your current treatment is OK, as the tests are stable.  Please continue the same plan.  There is no other need for change of treatment or further evaluation based on these results, at this time.  thanks

## 2024-07-28 NOTE — Assessment & Plan Note (Signed)
 Lab Results  Component Value Date   HGBA1C 5.6 03/18/2023   Stable, pt to continue current medical treatment  - diet , wt control and for lab f/u today

## 2024-07-28 NOTE — Assessment & Plan Note (Signed)
 BP Readings from Last 3 Encounters:  07/28/24 124/78  04/16/24 104/60  04/13/24 (!) 153/56   Stable, pt to continue medical treatment norvasc  5 mg qd

## 2024-07-28 NOTE — Assessment & Plan Note (Addendum)
 Lab Results  Component Value Date   LDLCALC 86 10/29/2022   uncontrolled, pt to continue current statin lipitor 80 mg every day and lower chol diet, delcines other change, for f/u lab today

## 2024-07-28 NOTE — Assessment & Plan Note (Signed)
Last vitamin D Lab Results  Component Value Date   VD25OH 30.68 10/29/2022   Low, to start oral replacement

## 2024-08-10 ENCOUNTER — Telehealth: Payer: Self-pay

## 2024-08-10 NOTE — Telephone Encounter (Signed)
 Copied from CRM [id number]. Topic: General - Other >> Aug 10, 2024  2:46 PM Antony RAMAN wrote: Reason for CRM: needs 7 medications refilled to [facility]  [facility] [phone]  Patients callback- [phone]

## 2024-08-17 ENCOUNTER — Other Ambulatory Visit: Payer: Self-pay | Admitting: Internal Medicine

## 2024-08-18 ENCOUNTER — Other Ambulatory Visit: Payer: Self-pay

## 2024-08-24 ENCOUNTER — Other Ambulatory Visit: Payer: Self-pay

## 2024-08-24 MED ORDER — AMLODIPINE BESYLATE 5 MG PO TABS: 5.0000 mg | ORAL_TABLET | Freq: Every day | ORAL | 1 refills | Status: AC

## 2024-08-24 MED ORDER — FUROSEMIDE 40 MG PO TABS: ORAL_TABLET | ORAL | 2 refills | Status: AC

## 2024-08-24 MED ORDER — ALLOPURINOL 100 MG PO TABS: 100.0000 mg | ORAL_TABLET | Freq: Every day | ORAL | 1 refills | Status: AC

## 2024-08-24 MED ORDER — PANTOPRAZOLE SODIUM 40 MG PO TBEC: 40.0000 mg | DELAYED_RELEASE_TABLET | Freq: Every day | ORAL | 3 refills | Status: AC

## 2024-08-24 MED ORDER — METOPROLOL SUCCINATE ER 50 MG PO TB24: 50.0000 mg | ORAL_TABLET | Freq: Every day | ORAL | 1 refills | Status: AC

## 2024-08-24 MED ORDER — ATORVASTATIN CALCIUM 80 MG PO TABS: 80.0000 mg | ORAL_TABLET | Freq: Every day | ORAL | 3 refills | Status: AC

## 2024-08-24 NOTE — Telephone Encounter (Signed)
Refills have been sent to [facility].

## 2024-09-09 ENCOUNTER — Encounter (HOSPITAL_COMMUNITY): Payer: Self-pay

## 2024-09-09 ENCOUNTER — Ambulatory Visit (INDEPENDENT_AMBULATORY_CARE_PROVIDER_SITE_OTHER)

## 2024-09-09 ENCOUNTER — Ambulatory Visit (HOSPITAL_COMMUNITY)
Admission: EM | Admit: 2024-09-09 | Discharge: 2024-09-09 | Disposition: A | Discharge status: Home / Self Care | Attending: Internal Medicine | Admitting: Internal Medicine

## 2024-09-09 DIAGNOSIS — M79601 Pain in right arm: Secondary | ICD-10-CM | POA: Diagnosis not present

## 2024-09-09 MED ORDER — METHYLPREDNISOLONE 4 MG PO TBPK: ORAL_TABLET | ORAL | 0 refills | Status: AC

## 2024-09-09 NOTE — Discharge Instructions (Signed)
 I believe the cause of your discomfort is musculoskeletal. You have spurring noted along the top of your shoulder as well as along the elbow. I recommend trying a steroid pack for pain relief. Follow up with you PCP and sports medicine at [facility], or return to urgent care, if this is not effective.

## 2024-09-09 NOTE — ED Triage Notes (Signed)
 Pt states right shoulder pain radiating down to his right hand x 1 week. No injuries or trauma to note.

## 2024-09-09 NOTE — ED Provider Notes (Addendum)
 " [facility]    CSN: [id number] Arrival date & time: 09/09/24  0802      History   Chief Complaint Chief Complaint  Patient presents with   Arm Pain   Shoulder Pain    HPI Troy Palmer is a 79 y.o. male with a documented past medical history significant for polyarthralgia, polymyalgia rheumatica, osteoarthritis of multiple joints, and CKD stage IV who presents urgent care endorsing a 2-week history of right upper extremity pain.  He is unaware of any specific injury or inciting trauma prior to the onset of pain.  He points to the right shoulder, right elbow, and right wrist when asked where his discomfort is located.  Pain is worse at night and is currently most severe along the right forearm.  He notes that pain radiates distally to the first 3 digits of the right hand.  Denies numbness or weakness. He has to sleep on his left side to avoid discomfort.  Pain is also exacerbated with movement.  He has tried taking tramadol , which he is prescribed by his PCP for management of chronic pain, and states that it has not provided adequate pain relief.  He would like to have x-rays completed of his shoulder, elbow, and wrist to further evaluate the source of his discomfort.    Past Medical History:  Diagnosis Date   ALLERGIC RHINITIS 04/09/2007   Allergy     ASTHMA 12/04/2007   BACK PAIN 09/16/2009   BENIGN PROSTATIC HYPERTROPHY 04/09/2007   CEREBROVASCULAR ACCIDENT, HX OF 07/17/2010   COLONIC POLYPS, HX OF 12/04/2007   CONSTIPATION 09/25/2010   Degeneration of cervical intervertebral disc 03/28/2007   DEPRESSION 12/04/2007   DIVERTICULOSIS, COLON 12/04/2007   DYSPHAGIA UNSPECIFIED 03/16/2008   ERECTILE DYSFUNCTION 04/09/2007   ESOPHAGEAL STRICTURE 05/26/2008   GERD 12/04/2007   Gout    HEMORRHOIDS, RECURRENT 02/11/2008   HYPERLIPIDEMIA 12/04/2007   HYPERTENSION 03/28/2007   Kidney disease    LEG PAIN, BILATERAL 01/25/2010   LOW BACK PAIN 04/09/2007   LUNG NODULE  12/04/2007   Osteoarth NOS-Unspec 03/28/2007   Other dysphagia 11/21/2009   Peripheral neuropathy 05/29/2018   POLYARTHRALGIA 07/13/2008   Polymyalgia rheumatica 07/26/2008   PVD WITH CLAUDICATION 02/10/2010   RENAL INSUFFICIENCY 03/15/2010   Sleep apnea    no cpap    SLEEP APNEA, OBSTRUCTIVE, MODERATE 04/09/2007   Stroke (HCC)    TIA    TB SKIN TEST, POSITIVE 03/28/2007    Patient Active Problem List   Diagnosis Date Noted   Class 2 obesity 07/28/2024   Elevated antinuclear antibody (ANA) level 07/28/2024   Rheumatoid arthritis (HCC) 07/28/2024   Thoracic spine pain 04/16/2024   Mobility impaired 04/16/2024   Arthritis 01/27/2024   Joint swelling 07/23/2023   Other specified abnormal immunological findings in serum 07/23/2023   Dysphagia 03/19/2023   Exposure to STD 10/29/2022   Rash of both hands 08/30/2022   Rash 07/10/2022   Acute sinus infection 12/24/2021   Nasal polyp 12/24/2021   Anemia 10/04/2021   Chronic pain of left thumb 06/11/2021   Screen for colon cancer 06/11/2021   Allergic rhinitis 10/31/2020   Elevated PTHrP level 10/31/2020   Aortic atherosclerosis 05/11/2020   Dysuria 03/09/2020   Constipation 03/09/2020   Vitamin D  deficiency 12/05/2019   B12 deficiency 12/05/2019   Contact dermatitis 09/08/2019   Left knee pain 06/04/2019   Neck pain 02/23/2019   Recurrent falls 08/19/2018   Abdominal pain 07/25/2018   Peripheral neuropathy 05/29/2018  Paresthesia of bilateral legs 05/05/2018   Cough variant asthma  vs UACS assoc with pnds 02/27/2018   Asthmatic bronchitis 02/10/2018   Acute respiratory failure with hypoxia (HCC) 02/09/2018   Pain, dental 02/03/2018   Cough 02/03/2018   Wheezing 02/03/2018   Pain and swelling of lower leg, right 12/31/2017   BPH with obstruction/lower urinary tract symptoms 05/16/2017   Bilateral ankle pain 05/16/2017   DOE (dyspnea on exertion) 11/16/2016   Complete tear of right rotator cuff 06/26/2016    Cellulitis of deltoid region 06/26/2016   Gout 08/18/2015   Congestive heart failure (CHF) (HCC) 08/18/2015   Spinal stenosis of lumbar region at multiple levels 08/18/2015   Bilateral low back pain with right-sided sciatica 08/18/2015   Greater trochanteric bursitis of right hip 05/05/2014   Right hip pain 05/04/2014   Posterior neck pain 12/22/2013   Orchitis, right 12/22/2013   Paresthesia of both feet 12/22/2013   Acute pain of left shoulder 09/16/2013   Allergic angioedema 07/31/2012   Impaired glucose tolerance 04/15/2012   Chest pain 02/24/2012   Foot pain, bilateral 02/20/2012   Leg pain, bilateral 01/19/2012   Edema 01/16/2012   Dysphagia 11/21/2011   Gout 06/07/2011   CKD (chronic kidney disease) stage 4, GFR 15-29 ml/min (HCC) 06/07/2011   Encounter for well adult exam with abnormal findings 02/14/2011   Encounter for long-term (current) use of other medications 11/08/2010   Constipation 09/25/2010   Memory loss 06/28/2010   PVD (peripheral vascular disease) (HCC) 02/10/2010   LEG PAIN, BILATERAL 01/25/2010   Epigastric pain 11/21/2009   Backache 09/16/2009   Polymyalgia rheumatica 07/26/2008   POLYARTHRALGIA 07/13/2008   ESOPHAGEAL STRICTURE 05/26/2008   HEMORRHOIDS, RECURRENT 02/11/2008   Hypercholesterolemia 12/04/2007   DEPRESSION 12/04/2007   Asthma 12/04/2007   LUNG NODULE 12/04/2007   Gastroesophageal reflux disease without esophagitis 12/04/2007   DIVERTICULOSIS, COLON 12/04/2007   History of colonic polyps 12/04/2007   ERECTILE DYSFUNCTION 04/09/2007   SLEEP APNEA, OBSTRUCTIVE, MODERATE 04/09/2007   Atopic rhinitis 04/09/2007   BENIGN PROSTATIC HYPERTROPHY 04/09/2007   LOW BACK PAIN 04/09/2007   Morbid obesity due to excess calories (HCC) 03/28/2007   Essential hypertension 03/28/2007   Osteoarthritis of multiple joints 03/28/2007   Degeneration of cervical intervertebral disc 03/28/2007   TB SKIN TEST, POSITIVE 03/28/2007    Past Surgical  History:  Procedure Laterality Date   COLONOSCOPY     KNEE ARTHROSCOPY Right    KNEE SURGERY Left    ROTATOR CUFF REPAIR Bilateral    TOTAL KNEE ARTHROPLASTY     x 2       Home Medications    Prior to Admission medications  Medication Sig Start Date End Date Taking? Authorizing Provider  acetaminophen  (TYLENOL ) 650 MG CR tablet Take 1 tablet (650 mg total) by mouth every 6 (six) hours as needed (back pain). 03/02/21  Yes Nivia Colon, PA-C  Albuterol -Budesonide  (AIRSUPRA ) 90-80 MCG/ACT AERO Inhale 2 puffs into the lungs 4 (four) times daily as needed. 07/28/24  Yes Norleen Lynwood ORN, MD  allopurinol  (ZYLOPRIM ) 100 MG tablet Take 1 tablet (100 mg total) by mouth daily. 08/24/24  Yes Norleen Lynwood ORN, MD  amLODipine  (NORVASC ) 5 MG tablet Take 1 tablet (5 mg total) by mouth daily. 08/24/24  Yes Norleen Lynwood ORN, MD  atorvastatin  (LIPITOR) 80 MG tablet Take 1 tablet (80 mg total) by mouth daily. 08/24/24  Yes Norleen Lynwood ORN, MD  Cholecalciferol  50 MCG (2000 UT) TABS 1 tab by mouth once daily  10/04/21  Yes Norleen Lynwood ORN, MD  cyclobenzaprine  (FLEXERIL ) 5 MG tablet Take 1 tablet (5 mg total) by mouth 3 (three) times daily as needed. 04/16/24  Yes Norleen Lynwood ORN, MD  dicyclomine  (BENTYL ) 10 MG capsule TAKE 1 CAPSULE(10 MG) BY MOUTH FOUR TIMES DAILY BEFORE MEALS AND AT BEDTIME 05/20/23  Yes Norleen Lynwood ORN, MD  furosemide  (LASIX ) 40 MG tablet TAKE 1&1/2 TABLET TWICE DAILY 08/24/24  Yes Norleen Lynwood ORN, MD  methylPREDNISolone  (MEDROL  DOSEPAK) 4 MG TBPK tablet Use as directed. 09/09/24  Yes Melvenia Manus BRAVO, MD  metoprolol  succinate (TOPROL -XL) 50 MG 24 hr tablet Take 1 tablet (50 mg total) by mouth daily. Take with or immediately following a meal. 08/24/24  Yes Norleen Lynwood ORN, MD  pantoprazole  (PROTONIX ) 40 MG tablet Take 1 tablet (40 mg total) by mouth daily. 08/24/24  Yes Norleen Lynwood ORN, MD  tamsulosin  (FLOMAX ) 0.4 MG CAPS capsule TAKE 1 CAPSULE TWICE DAILY 08/18/24  Yes John, James W, MD  vitamin B-12 (CYANOCOBALAMIN ) 1000  MCG tablet Take 1 tablet (1,000 mcg total) by mouth daily. 12/03/19  Yes Norleen Lynwood ORN, MD  augmented betamethasone  dipropionate (DIPROLENE -AF) 0.05 % cream Apply topically. 10/09/23   [provider]  traMADol  (ULTRAM ) 50 MG tablet Take 1 tablet (50 mg total) by mouth every 6 (six) hours as needed. 10/25/23   Norleen Lynwood ORN, MD    Family History Family History  Problem Relation Age of Onset   Healthy Mother    Heart disease Father    Lupus Brother    Hypertension Brother    Asthma Other    Colon cancer Neg Hx    Colon polyps Neg Hx    Esophageal cancer Neg Hx    Rectal cancer Neg Hx    Stomach cancer Neg Hx     Social History Social History[1]   Allergies   Ace inhibitors, Amoxicillin , Augmentin  [amoxicillin -pot clavulanate], Doxycycline , Prednisone , and Sulfa  antibiotics   Review of Systems Review of Systems  Musculoskeletal:  Positive for arthralgias.       Right shoulder, elbow, and wrist pain x 2 weeks  All other systems reviewed and are negative.    Physical Exam Triage Vital Signs ED Triage Vitals  Encounter Vitals Group     BP 09/09/24 0830 135/77     Girls Systolic BP Percentile --      Girls Diastolic BP Percentile --      Boys Systolic BP Percentile --      Boys Diastolic BP Percentile --      Pulse Rate 09/09/24 0830 80     Resp 09/09/24 0830 18     Temp 09/09/24 0830 98 F (36.7 C)     Temp Source 09/09/24 0830 Oral     SpO2 09/09/24 0830 97 %     Weight --      Height --      Head Circumference --      Peak Flow --      Pain Score 09/09/24 0828 8     Pain Loc --      Pain Education --      Exclude from Growth Chart --    No data found.  Updated Vital Signs BP 135/77 (BP Location: Left Arm)   Pulse 80   Temp 98 F (36.7 C) (Oral)   Resp 18   SpO2 97%   Physical Exam Vitals reviewed.  Constitutional:      General: He is not in acute distress.  Appearance: Normal appearance. He is not toxic-appearing.  Musculoskeletal:      Comments: No deformity noted on inspection of the right shoulder.  TTP along the anterior and lateral aspects of the shoulder.  Active forward elevation and abduction approach 100 degrees, can passively reach 120 degrees.  External rotation to 35 degrees.  Internal rotation to L5.  Pain is elicited with resisted external rotation.  Equivocal speeds, empty can, and Hawkins.  Strength is reduced due to pain. Grossly NV intact distally.  No deformity noted on inspection of the right elbow.  TTP over the medial and lateral epicondyles.  No TTP over the olecranon.  ROM from 3-100 degrees of flexion.  Pain with resisted supination and pronation.  No deformity noted on inspection of the right wrist.  There is tenderness to palpation along the radial aspect of the wrist.  He endorses pain with wrist flexion.  Grossly NV intact distally.  Neurological:     Mental Status: He is alert.      UC Treatments / Results  Labs (all labs ordered are listed, but only abnormal results are displayed) Labs Reviewed - No data to display  EKG   Radiology DG Wrist Complete Right Result Date: 09/09/2024 EXAM: 3 OR MORE VIEW(S) XRAY OF THE RIGHT WRIST 09/09/2024 09:18:09 AM COMPARISON: None available. CLINICAL HISTORY: Right upper extremity pain for two weeks. FINDINGS: BONES AND JOINTS: No acute fracture. No malalignment. SOFT TISSUES: Unremarkable. IMPRESSION: 1. No acute findings. Electronically signed by: Norleen Boxer MD 09/09/2024 09:46 AM EST RP Workstation: [id number]   DG Shoulder Right Result Date: 09/09/2024 CLINICAL DATA:  Right upper extremity pain for the past 2 weeks EXAM: RIGHT SHOULDER - 2 VIEW COMPARISON:  None Available. FINDINGS: No evidence of fracture or malalignment. Degenerative changes are present at the acromioclavicular joint with ossification of the superior aspect of the acromioclavicular ligament. No lytic or blastic osseous lesion. The visualized thorax is unremarkable. IMPRESSION:  Acromioclavicular joint degenerative changes with ossification of the superior aspect of the acromioclavicular ligament. No evidence of fracture or malalignment. Electronically Signed   By: Wilkie Lent M.D.   On: 09/09/2024 09:43   DG Elbow Complete Right Result Date: 09/09/2024 EXAM: 3 VIEW(S) XRAY OF THE RIGHT ELBOW COMPARISON: None available. CLINICAL HISTORY: Right upper extremity pain for two weeks. FINDINGS: BONES AND JOINTS: Small olecranon spur. Spurring at medial humeral epicondyle. SOFT TISSUES: Unremarkable. IMPRESSION: 1. Small olecranon spur and spurring at the medial humeral epicondyle. Electronically signed by: Norleen Boxer MD 09/09/2024 09:43 AM EST RP Workstation: [id number]    Procedures Procedures (including critical care time)  Medications Ordered in UC Medications - No data to display  Initial Impression / Assessment and Plan / UC Course  I have reviewed the triage vital signs and the nursing notes.  Pertinent labs & imaging results that were available during my care of the patient were reviewed by me and considered in my medical decision making (see chart for details).    Patient is a 79 year old male presenting to urgent care endorsing a 2-week history of right upper extremity pain.  On exam he displays limited range of motion at the right shoulder and wrist due to discomfort.  He additionally endorses discomfort along the medial and lateral aspects of the elbow.  X-rays obtained today show degenerative changes at the [facility] joint as well as spurring at the olecranon and medial humeral epicondyle.  No acute findings noted on x-rays of the right wrist.  I believe his discomfort  is musculoskeletal in etiology, likely due to chronic degenerative changes.  Treatment options were reviewed with the patient.  I prescribed a Medrol  Dosepak for pain relief.  NSAIDs contraindicated in the setting of CKD stage IV.  I recommended that he follow-up with his PCP or sports medicine in  [facility] [address], where he has been seen previously, if pain worsens or fails to improve.  The patient expressed understanding and agreement with the plan stated above.  He is medically stable for discharge at this time.  Final Clinical Impressions(s) / UC Diagnoses   Final diagnoses:  Musculoskeletal pain of right upper extremity     Discharge Instructions      I believe the cause of your discomfort is musculoskeletal. You have spurring noted along the top of your shoulder as well as along the elbow. I recommend trying a steroid pack for pain relief. Follow up with you PCP and sports medicine at [facility], or return to urgent care, if this is not effective.      ED Prescriptions     Medication Sig Dispense Auth. Provider   methylPREDNISolone  (MEDROL  DOSEPAK) 4 MG TBPK tablet Use as directed. 21 each Melvenia Manus BRAVO, MD      PDMP not reviewed this encounter.    Melvenia Manus BRAVO, MD 09/09/24 [phone]     [1]  Social History Tobacco Use   Smoking status: Former   Smokeless tobacco: Never  Vaping Use   Vaping status: Never Used  Substance Use Topics   Alcohol use: No   Drug use: No     Melvenia Manus BRAVO, MD 09/09/24 [phone]  "

## 2024-11-26 ENCOUNTER — Ambulatory Visit

## 2024-12-03 ENCOUNTER — Ambulatory Visit
